# Patient Record
Sex: Female | Born: 1960
Health system: Southern US, Community
[De-identification: ages and names within clinical notes are randomized; demographics above are authoritative.]

## PROBLEM LIST (undated history)

## (undated) DIAGNOSIS — T7840XA Allergy, unspecified, initial encounter: Secondary | ICD-10-CM

## (undated) DIAGNOSIS — F32A Depression, unspecified: Secondary | ICD-10-CM

## (undated) DIAGNOSIS — M8580 Other specified disorders of bone density and structure, unspecified site: Secondary | ICD-10-CM

## (undated) DIAGNOSIS — R42 Dizziness and giddiness: Secondary | ICD-10-CM

## (undated) DIAGNOSIS — I10 Essential (primary) hypertension: Secondary | ICD-10-CM

## (undated) DIAGNOSIS — M1990 Unspecified osteoarthritis, unspecified site: Secondary | ICD-10-CM

## (undated) DIAGNOSIS — E785 Hyperlipidemia, unspecified: Secondary | ICD-10-CM

## (undated) DIAGNOSIS — F419 Anxiety disorder, unspecified: Secondary | ICD-10-CM

## (undated) DIAGNOSIS — K219 Gastro-esophageal reflux disease without esophagitis: Secondary | ICD-10-CM

## (undated) DIAGNOSIS — N979 Female infertility, unspecified: Secondary | ICD-10-CM

## (undated) DIAGNOSIS — G43909 Migraine, unspecified, not intractable, without status migrainosus: Secondary | ICD-10-CM

## (undated) HISTORY — DX: Other specified disorders of bone density and structure, unspecified site: M85.80

## (undated) HISTORY — DX: Depression, unspecified: F32.A

## (undated) HISTORY — DX: Essential (primary) hypertension: I10

## (undated) HISTORY — DX: Anxiety disorder, unspecified: F41.9

## (undated) HISTORY — DX: Unspecified osteoarthritis, unspecified site: M19.90

## (undated) HISTORY — DX: Migraine, unspecified, not intractable, without status migrainosus: G43.909

## (undated) HISTORY — PX: SHOULDER SURGERY: SHX246

## (undated) HISTORY — PX: ANKLE SURGERY: SHX546

## (undated) HISTORY — DX: Hyperlipidemia, unspecified: E78.5

## (undated) HISTORY — DX: Gastro-esophageal reflux disease without esophagitis: K21.9

## (undated) HISTORY — DX: Female infertility, unspecified: N97.9

## (undated) HISTORY — DX: Dizziness and giddiness: R42

## (undated) HISTORY — DX: Allergy, unspecified, initial encounter: T78.40XA

---

## 1998-01-30 ENCOUNTER — Inpatient Hospital Stay (HOSPITAL_COMMUNITY): Admission: AD | Admit: 1998-01-30 | Discharge: 1998-02-01 | Payer: Self-pay | Admitting: Obstetrics and Gynecology

## 1998-01-30 ENCOUNTER — Inpatient Hospital Stay (HOSPITAL_COMMUNITY): Admission: AD | Admit: 1998-01-30 | Discharge: 1998-01-30 | Payer: Self-pay | Admitting: Obstetrics and Gynecology

## 1998-03-23 ENCOUNTER — Other Ambulatory Visit: Admission: RE | Admit: 1998-03-23 | Discharge: 1998-03-23 | Payer: Self-pay | Admitting: Obstetrics and Gynecology

## 1999-04-06 ENCOUNTER — Other Ambulatory Visit: Admission: RE | Admit: 1999-04-06 | Discharge: 1999-04-06 | Payer: Self-pay | Admitting: Obstetrics and Gynecology

## 1999-10-04 ENCOUNTER — Encounter: Payer: Self-pay | Admitting: Obstetrics and Gynecology

## 1999-10-04 ENCOUNTER — Ambulatory Visit (HOSPITAL_COMMUNITY): Admission: RE | Admit: 1999-10-04 | Discharge: 1999-10-04 | Payer: Self-pay | Admitting: Obstetrics and Gynecology

## 2000-04-17 ENCOUNTER — Other Ambulatory Visit: Admission: RE | Admit: 2000-04-17 | Discharge: 2000-04-17 | Payer: Self-pay | Admitting: Obstetrics and Gynecology

## 2002-04-28 ENCOUNTER — Encounter: Payer: Self-pay | Admitting: Obstetrics and Gynecology

## 2002-04-28 ENCOUNTER — Ambulatory Visit (HOSPITAL_COMMUNITY): Admission: RE | Admit: 2002-04-28 | Discharge: 2002-04-28 | Payer: Self-pay | Admitting: Obstetrics and Gynecology

## 2002-10-02 ENCOUNTER — Other Ambulatory Visit: Admission: RE | Admit: 2002-10-02 | Discharge: 2002-10-02 | Payer: Self-pay | Admitting: Obstetrics and Gynecology

## 2003-12-16 ENCOUNTER — Other Ambulatory Visit: Admission: RE | Admit: 2003-12-16 | Discharge: 2003-12-16 | Payer: Self-pay | Admitting: Obstetrics and Gynecology

## 2004-08-23 ENCOUNTER — Ambulatory Visit (HOSPITAL_COMMUNITY): Admission: RE | Admit: 2004-08-23 | Discharge: 2004-08-23 | Payer: Self-pay | Admitting: Obstetrics and Gynecology

## 2004-10-20 ENCOUNTER — Ambulatory Visit: Payer: Self-pay | Admitting: Internal Medicine

## 2004-11-18 ENCOUNTER — Ambulatory Visit: Payer: Self-pay | Admitting: Internal Medicine

## 2004-12-15 ENCOUNTER — Ambulatory Visit: Payer: Self-pay | Admitting: Internal Medicine

## 2004-12-20 ENCOUNTER — Other Ambulatory Visit: Admission: RE | Admit: 2004-12-20 | Discharge: 2004-12-20 | Payer: Self-pay | Admitting: Obstetrics and Gynecology

## 2004-12-23 ENCOUNTER — Encounter: Admission: RE | Admit: 2004-12-23 | Discharge: 2004-12-23 | Payer: Self-pay | Admitting: Obstetrics and Gynecology

## 2005-05-24 ENCOUNTER — Emergency Department (HOSPITAL_COMMUNITY): Admission: EM | Admit: 2005-05-24 | Discharge: 2005-05-24 | Payer: Self-pay | Admitting: Family Medicine

## 2005-11-09 ENCOUNTER — Ambulatory Visit: Payer: Self-pay | Admitting: Internal Medicine

## 2006-03-10 ENCOUNTER — Emergency Department (HOSPITAL_COMMUNITY): Admission: EM | Admit: 2006-03-10 | Discharge: 2006-03-10 | Payer: Self-pay | Admitting: Family Medicine

## 2006-04-19 ENCOUNTER — Other Ambulatory Visit: Admission: RE | Admit: 2006-04-19 | Discharge: 2006-04-19 | Payer: Self-pay | Admitting: Obstetrics and Gynecology

## 2007-03-06 ENCOUNTER — Ambulatory Visit: Payer: Self-pay | Admitting: Internal Medicine

## 2008-03-27 ENCOUNTER — Encounter: Admission: RE | Admit: 2008-03-27 | Discharge: 2008-03-27 | Payer: Self-pay | Admitting: Obstetrics and Gynecology

## 2008-09-02 ENCOUNTER — Ambulatory Visit: Payer: Self-pay | Admitting: Occupational Medicine

## 2008-09-02 LAB — CONVERTED CEMR LAB
Bilirubin Urine: NEGATIVE
Glucose, Urine, Semiquant: NEGATIVE
Specific Gravity, Urine: 1.01
pH: 6.5

## 2008-12-11 LAB — HM PAP SMEAR

## 2008-12-11 LAB — HM COLONOSCOPY

## 2009-01-19 ENCOUNTER — Ambulatory Visit: Payer: Self-pay | Admitting: Nurse Practitioner

## 2009-04-07 ENCOUNTER — Ambulatory Visit: Payer: Self-pay | Admitting: Family Medicine

## 2009-04-07 DIAGNOSIS — R519 Headache, unspecified: Secondary | ICD-10-CM | POA: Insufficient documentation

## 2009-04-07 DIAGNOSIS — R51 Headache: Secondary | ICD-10-CM

## 2009-04-07 LAB — CONVERTED CEMR LAB
Glucose, Urine, Semiquant: 100
Ketones, urine, test strip: NEGATIVE
Nitrite: POSITIVE
Specific Gravity, Urine: 1.02
Urobilinogen, UA: 1
pH: 5.5

## 2009-05-26 ENCOUNTER — Ambulatory Visit: Payer: Self-pay | Admitting: Nurse Practitioner

## 2010-01-04 ENCOUNTER — Ambulatory Visit: Payer: Self-pay | Admitting: Family Medicine

## 2010-01-04 ENCOUNTER — Telehealth: Payer: Self-pay | Admitting: Family Medicine

## 2010-01-04 DIAGNOSIS — J029 Acute pharyngitis, unspecified: Secondary | ICD-10-CM | POA: Insufficient documentation

## 2010-01-05 ENCOUNTER — Encounter: Payer: Self-pay | Admitting: Family Medicine

## 2010-03-15 ENCOUNTER — Ambulatory Visit: Payer: Self-pay | Admitting: Nurse Practitioner

## 2010-03-17 ENCOUNTER — Ambulatory Visit: Payer: Self-pay | Admitting: Obstetrics and Gynecology

## 2010-03-17 ENCOUNTER — Other Ambulatory Visit: Admission: RE | Admit: 2010-03-17 | Discharge: 2010-03-17 | Payer: Self-pay | Admitting: Obstetrics and Gynecology

## 2010-03-24 ENCOUNTER — Ambulatory Visit: Payer: Self-pay | Admitting: Obstetrics and Gynecology

## 2010-03-29 ENCOUNTER — Encounter: Admission: RE | Admit: 2010-03-29 | Discharge: 2010-03-29 | Payer: Self-pay | Admitting: Obstetrics and Gynecology

## 2010-09-13 ENCOUNTER — Ambulatory Visit: Payer: Self-pay | Admitting: Nurse Practitioner

## 2010-10-31 ENCOUNTER — Encounter (INDEPENDENT_AMBULATORY_CARE_PROVIDER_SITE_OTHER): Payer: Self-pay | Admitting: *Deleted

## 2010-11-01 ENCOUNTER — Ambulatory Visit: Payer: Self-pay | Admitting: Gastroenterology

## 2010-11-10 ENCOUNTER — Ambulatory Visit: Payer: Self-pay | Admitting: Gastroenterology

## 2010-11-10 LAB — HM COLONOSCOPY

## 2011-01-12 NOTE — Assessment & Plan Note (Signed)
Summary: LEFT TONSIL & EARACHE PAIN/KH   Vital Signs:  Patient Profile:   50 Years Old Female CC:      left tonsil and left earache x 4 days Height:     66 inches Weight:      165 pounds O2 Sat:      100 % O2 treatment:    Room Air Temp:     97.0 degrees F oral Pulse rate:   55 / minute Pulse rhythm:   regular Resp:     12 per minute BP sitting:   117 / 79  (right arm) Cuff size:   regular  Vitals Entered By: Emilio Math (January 04, 2010 9:03 AM)                  Current Allergies (reviewed today): No known allergies History of Present Illness Chief Complaint: left tonsil and left earache x 4 days History of Present Illness: Subjective:  Patient complains of 4 day history of pain and swelling in left tonsil, with left neck tenderness and ? left earache.  Has chronic nasal drainage but no exacerabation recently.  No cough.  No fevers, chills, and sweats but has been taking Ibuprofen which decreases the discomfort.  Has felt fatigued.  She has had several similar episodes in the past, some resolving spontaneously and some requiring an antibiotic.  Current Meds TOPAMAX 100 MG TABS (TOPIRAMATE) 1 hs ZOLOFT 50 MG TABS (SERTRALINE HCL) 1 qd REGLAN 10 MG TABS (METOCLOPRAMIDE HCL) 1 prn METOPROLOL TARTRATE 25 MG TABS (METOPROLOL TARTRATE) 1 hs CALCIUM 500/D 500-200 MG-UNIT TABS (CALCIUM CARBONATE-VITAMIN D) 1 qd ADULT ASPIRIN EC LOW STRENGTH 81 MG TBEC (ASPIRIN) 1 hs MULTIVITAMINS  TABS (MULTIPLE VITAMIN) 1 hs CEPHALEXIN 500 MG CAPS (CEPHALEXIN) 1 by mouth qday after relationship IBUPROFEN 200 MG TABS (IBUPROFEN) 800 mgs q 8 hrs ZYRTEC ALLERGY 10 MG TABS (CETIRIZINE HCL) 1 qd AMOXICILLIN 875 MG TABS (AMOXICILLIN) One by mouth two times a day  REVIEW OF SYSTEMS Constitutional Symptoms      Denies fever, chills, night sweats, weight loss, weight gain, and fatigue.  Eyes       Denies change in vision, eye pain, eye discharge, glasses, contact lenses, and eye  surgery. Ear/Nose/Throat/Mouth       Complains of ear pain.      Denies hearing loss/aids, change in hearing, ear discharge, dizziness, frequent runny nose, frequent nose bleeds, sinus problems, sore throat, hoarseness, and tooth pain or bleeding.      Comments: Left tonsil Respiratory       Denies dry cough, productive cough, wheezing, shortness of breath, asthma, bronchitis, and emphysema/COPD.  Cardiovascular       Denies murmurs, chest pain, and tires easily with exhertion.    Gastrointestinal       Denies stomach pain, nausea/vomiting, diarrhea, constipation, blood in bowel movements, and indigestion. Genitourniary       Denies painful urination, kidney stones, and loss of urinary control. Neurological       Denies paralysis, seizures, and fainting/blackouts. Musculoskeletal       Denies muscle pain, joint pain, joint stiffness, decreased range of motion, redness, swelling, muscle weakness, and gout.  Skin       Denies bruising, unusual mles/lumps or sores, and hair/skin or nail changes.  Psych       Denies mood changes, temper/anger issues, anxiety/stress, speech problems, depression, and sleep problems.  Past History:  Past Medical History: Reviewed history from 04/07/2009 and no changes required. Headache-Migraine  Past Surgical History: Reviewed history from 04/07/2009 and no changes required. left ankle left shoulder GYN surgery  Family History: Reviewed history from 04/07/2009 and no changes required. Family History of Alcoholism/Addiction Family History High cholesterol Family History Hypertension Family History of Stroke F 1st degree relative <60  Social History: Reviewed history from 04/07/2009 and no changes required. Occupation:Healthworker Married Former Smoker-quit 101yrs ago Alcohol use-yes, occasional Drug use-no   Objective:  No acute distress  Ears:  Canals normal.  Tympanic membranes normal.   Pharynx:  Left tonsil slightly enlarged but  minimal erythema.   No exudate. Neck:  Supple.  Tender shotty left anterior node. Assessment New Problems: ACUTE PHARYNGITIS (ICD-462)   Plan New Medications/Changes: AMOXICILLIN 875 MG TABS (AMOXICILLIN) One by mouth two times a day  #20 x 0, 01/04/2010, Donna Christen MD  New Orders: T-Culture, Throat [[id number]] Est. Patient Level III [14782] Planning Comments:   Throat culture pending.  Treat symptomatically for now with Ibuprofen.  If culture positive, begin amoxicillin.  If culture negative, but no improvement after several more days, begin amoxicillin. Continue Ibuprofen.  Warm saline gargles.   The patient and/or caregiver has been counseled thoroughly with regard to medications prescribed including dosage, schedule, interactions, rationale for use, and possible side effects and they verbalize understanding.  Diagnoses and expected course of recovery discussed and will return if not improved as expected or if the condition worsens. Patient and/or caregiver verbalized understanding.  Prescriptions: AMOXICILLIN 875 MG TABS (AMOXICILLIN) One by mouth two times a day  #20 x 0   Entered and Authorized by:   Donna Christen MD   Signed by:   Donna Christen MD on 01/04/2010   Method used:   Print then Give to Patient   RxID:   [id number]

## 2011-01-12 NOTE — Miscellaneous (Signed)
Summary: [facility]  Clinical Lists Changes  Medications: Added new medication of MOVIPREP 100 GM  SOLR (PEG-KCL-NACL-NASULF-NA ASC-C) As per prep instructions. - Signed Rx of MOVIPREP 100 GM  SOLR (PEG-KCL-NACL-NASULF-NA ASC-C) As per prep instructions.;  #1 x 0;  Signed;  Entered by: Ezra Sites RN;  Authorized by: Meryl Dare MD [name];  Method used: Electronically to [facility]*, [address], [address] Shipping/mailing, [place], [place]  [address], Ph: [phone], Fax: [phone] Observations: Added new observation of NKA: T (11/01/2010 10:57)    Prescriptions: MOVIPREP 100 GM  SOLR (PEG-KCL-NACL-NASULF-NA ASC-C) As per prep instructions.  #1 x 0   Entered by:   Ezra Sites RN   Authorized by:   Meryl Dare MD [facility]   Signed by:   Ezra Sites RN on 11/01/2010   Method used:   Electronically to        [facility]* (retail)       [address]       [address] [place] [address]       [place], [place]  [address]       Ph: [phone]       Fax: [phone]   RxID:   [id number]

## 2011-01-12 NOTE — Progress Notes (Signed)
  Phone Note From Pharmacy   Summary of Call: [place]/Pharmacy [facility] @ [phone] cllng in requesting change in antibiotic  Amoxicillin 875mg  to Amoxicillin 500mg  t.i.d. due Amx 875mg  being on back order. Also, pharmacist stated that pt is requesting Diflucan as well.  Initial call taken by: Areta Haber CMA,  January 04, 2010 6:05 PM    New/Updated Medications: AMOXICILLIN 500 MG CAPS (AMOXICILLIN) One capsule by mouth three times a day DIFLUCAN 150 MG TABS (FLUCONAZOLE) One by mouth as a single dose Prescriptions: DIFLUCAN 150 MG TABS (FLUCONAZOLE) One by mouth as a single dose  #1 x 0   Entered and Authorized by:   Donna Christen MD   Signed by:   Donna Christen MD on 01/04/2010   Method used:   Electronically to        [facility] [address] [id number]* (retail)       [address]       [place], [place]  [address]       Ph: [phone]       Fax: [phone]   RxID:   [id number] AMOXICILLIN 500 MG CAPS (AMOXICILLIN) One capsule by mouth three times a day  #30 x 0   Entered and Authorized by:   Donna Christen MD   Signed by:   Donna Christen MD on 01/04/2010   Method used:   Electronically to        [facility] [address] [id number]* (retail)       [address]       [place], [place]  [address]       Ph: [phone]       Fax: [phone]   RxID:   [id number]  Rx sent Donna Christen MD  January 04, 2010 6:55 PM

## 2011-01-12 NOTE — Procedures (Signed)
Summary: Colonoscopy  Patient: [name] Note: All result statuses are Final unless otherwise noted.  Tests: (1) Colonoscopy (COL)   COL Colonoscopy           DONE     [facility]     [address]     [place], [place]  [address]           COLONOSCOPY PROCEDURE REPORT     PATIENT:  [name], [name]  MR#:  [id number]     BIRTHDATE:  11/05/1961, 49 yrs. old  GENDER:  female     ENDOSCOPIST:  Judie Petit T. Russella Dar, MD, [facility]     Referred by:  Tracey Harries, M.D.     PROCEDURE DATE:  11/10/2010     PROCEDURE:  Colonoscopy 19[address]     ASA CLASS:  Class II     INDICATIONS:  1) Routine Risk Screening     MEDICATIONS:   Fentanyl 75 mcg IV, Versed 7 mg IV     DESCRIPTION OF PROCEDURE:   After the risks benefits and     alternatives of the procedure were thoroughly explained, informed     consent was obtained.  Digital rectal exam was performed and     revealed no abnormalities.   The LB [id number] endoscope     was introduced through the anus and advanced to the cecum, which     was identified by both the appendix and ileocecal valve, without     limitations.  The quality of the prep was excellent, using     MoviPrep.  The instrument was then slowly withdrawn as the colon     was fully examined.     <<PROCEDUREIMAGES>>     FINDINGS:  A normal appearing cecum, ileocecal valve, and     appendiceal orifice were identified. The ascending, hepatic     flexure, transverse, splenic flexure, descending, sigmoid colon,     and rectum appeared unremarkable.  Retroflexed views in the rectum     revealed no abnormalities.  The time to cecum =  4.25  minutes.     The scope was then withdrawn (time =  10.33  min) from the patient     and the procedure completed.           COMPLICATIONS:  None           ENDOSCOPIC IMPRESSION:     1) Normal colon           RECOMMENDATIONS:     1) Continue current colorectal screening for "routine risk"     patients with a repeat colonoscopy in 10  years.           Venita Lick. Russella Dar, MD, [name]           n.     eSIGNED:   Venita Lick. Stark at 11/10/2010 11:47 AM           Emilio Math, [phone]  Note: An exclamation mark (!) indicates a result that was not dispersed into the flowsheet. Document Creation Date: 11/10/2010 11:48 AM _______________________________________________________________________  (1) Order result status: Final Collection or observation date-time: 11/10/2010 11:45 Requested date-time:  Receipt date-time:  Reported date-time:  Referring Physician:   Ordering Physician: Claudette Head [phone]) Specimen Source:  Source: Launa Grill Order Number: [id number] Lab site:   Appended Document: Colonoscopy    Clinical Lists Changes  Observations: Added new observation of COLONNXTDUE: 11/2020 (11/10/2010 12:34)

## 2011-01-12 NOTE — Letter (Signed)
Summary: [name]  [facility]  [address] [place], [place] [address]   Phone: [phone]  Fax: [phone]       [name]    1961/05/25    MRN: [id number]        Procedure Day /Date:  Thursday 11/10/2010     Arrival Time: 9:00 am     Procedure Time:  10:00 am     Location of Procedure:                    _ x_  [facility] (4th Floor)                        PREPARATION FOR COLONOSCOPY WITH MOVIPREP   Starting 5 days prior to your procedure Saturday 11/26 do not eat nuts, seeds, popcorn, corn, beans, peas,  salads, or any raw vegetables.  Do not take any fiber supplements (e.g. Metamucil, Citrucel, and Benefiber).  THE DAY BEFORE YOUR PROCEDURE         DATE: Wednesday 11/30  1.  Drink clear liquids the entire day-NO SOLID FOOD  2.  Do not drink anything colored red or purple.  Avoid juices with pulp.  No orange juice.  3.  Drink at least 64 oz. (8 glasses) of fluid/clear liquids during the day to prevent dehydration and help the prep work efficiently.  CLEAR LIQUIDS INCLUDE: Water Jello Ice Popsicles Tea (sugar ok, no milk/cream) Powdered fruit flavored drinks Coffee (sugar ok, no milk/cream) Gatorade Juice: apple, white grape, white cranberry  Lemonade Clear bullion, consomm, broth Carbonated beverages (any kind) Strained chicken noodle soup Hard Candy                             4.  In the morning, mix first dose of MoviPrep solution:    Empty 1 Pouch A and 1 Pouch B into the disposable container    Add lukewarm drinking water to the top line of the container. Mix to dissolve    Refrigerate (mixed solution should be used within 24 hrs)  5.  Begin drinking the prep at 5:00 p.m. The MoviPrep container is divided by 4 marks.   Every 15 minutes drink the solution down to the next mark (approximately 8 oz) until the full liter is complete.   6.  Follow completed prep with 16 oz of clear liquid of your choice  (Nothing red or purple).  Continue to drink clear liquids until bedtime.  7.  Before going to bed, mix second dose of MoviPrep solution:    Empty 1 Pouch A and 1 Pouch B into the disposable container    Add lukewarm drinking water to the top line of the container. Mix to dissolve    Refrigerate  THE DAY OF YOUR PROCEDURE      DATE: Thursday 12/1  Beginning at 5:00 a.m. (5 hours before procedure):         1. Every 15 minutes, drink the solution down to the next mark (approx 8 oz) until the full liter is complete.  2. Follow completed prep with 16 oz. of clear liquid of your choice.    3. You may drink clear liquids until 8:00 am (2 HOURS BEFORE PROCEDURE).   MEDICATION INSTRUCTIONS  Unless otherwise instructed, you should take regular prescription medications with a small sip of water   as early as possible the morning of  your procedure.          OTHER INSTRUCTIONS  You will need a responsible adult at least 52 years of age to accompany you and drive you home.   This person must remain in the waiting room during your procedure.  Wear loose fitting clothing that is easily removed.  Leave jewelry and other valuables at home.  However, you may wish to bring a book to read or  an iPod/MP3 player to listen to music as you wait for your procedure to start.  Remove all body piercing jewelry and leave at home.  Total time from sign-in until discharge is approximately 2-3 hours.  You should go home directly after your procedure and rest.  You can resume normal activities the  day after your procedure.  The day of your procedure you should not:   Drive   Make legal decisions   Operate machinery   Drink alcohol   Return to work  You will receive specific instructions about eating, activities and medications before you leave.    The above instructions have been reviewed and explained to me by   Ezra Sites RN  November 01, 2010 11:27 AM    I fully understand and  can verbalize these instructions _____________________________ Date _________

## 2011-04-25 NOTE — Assessment & Plan Note (Signed)
[name], [name]               ACCOUNT NO.:  [id number]   MEDICAL RECORD NO.:  [id number]          PATIENT TYPE:  POB   LOCATION:  [facility] [place]         FACILITY:  [facility]   PHYSICIAN:  Allie Bossier, MD        DATE OF BIRTH:  1961-10-06   DATE OF SERVICE:  05/26/2009                                  CLINIC NOTE   The patient comes to the office today for headache revisit.  She has  been doing very well with her headaches until one month ago when her  mother-in-law died.  They had spent many nights up with her at night  sitting with her.  She was missing sleep and upset and anxious.  Following that she has had a 2-3 week cycle of headaches.  She has used  most of her medication and cannot seem to break her headache cycle.  She  has taken a prednisone dose taper in the past and that has worked well  for her and she would like to have that again today.  She would also  like to have her medications refilled.  Otherwise she is doing very well  with her job at [employer] and her family is doing well.   PHYSICAL EXAMINATION:  Well-developed, well-nourished 50 year old  Caucasian female in no acute distress.  VITAL SIGNS:  Blood pressure 118/73, pulse 45, weight 156.   ASSESSMENT:  Migraine headaches.   PLAN:  1. We will give her a prednisone taper, prednisone 20 mg 4 tablets x 1      day, 3 tablets x 1 day, 2 tablets x 1 day and 1 tablet x 1day #10      with one refill.  2. She is also requesting refills on her Imitrex injection, 6 mg one      injectable p.r.n. migraine #2 with p.r.n. refills.  3. Amerge 2.5 mg 1 p.o. p.r.n. migraine #9 with p.r.n. refills.  4. Xanax 0.25 mg 1 p.o. every 6 hours p.r.n. #30 with 2 refills.  5. Vicodin ES 1 p.o. every 4 hours p.r.n. #30 with one refill.  6. The patient will continue to do a good job taking care of her      headaches and we will see her back in 6 months.      Remonia Richter, NP    ______________________________  Allie Bossier,  MD    LR/MEDQ  D:  05/26/2009  T:  05/26/2009  Job:  [id number]

## 2011-04-25 NOTE — Assessment & Plan Note (Signed)
NAME:  [name], [name]               ACCOUNT NO.:  [id number]   MEDICAL RECORD NO.:  [id number]          PATIENT TYPE:  POB   LOCATION:  [facility]         FACILITY:  [facility]   PHYSICIAN:  Argentina Donovan, MD        DATE OF BIRTH:  01-22-1961   DATE OF SERVICE:  01/19/2009                                  CLINIC NOTE   The patient comes to office today for consultation for migraine  headaches.  The patient does have very longstanding history  approximately 30 years of having migraine with aura.  The aura is visual  in nature.  Her headaches are located right or left temple.  Her  headaches are significantly improved.  She has approximately 1 severe, 1  moderate, 1 mild per month.  The patient is in on prevention in the form  of Zoloft, metoprolol, and Topamax.  She does have her p.r.n.  medications, which she takes on an as-needed basis.  This patient admits  that her triggers are stress.  Her headaches were significantly worse  when she had her periods.  She has not had a menstrual cycle in the last  3 years.  She has declined hormone replacement therapy and is currently  in menopause.  The patient [facility] well [facility]  She is very happy with her care plan from there and  would basically like to have her medications refilled.   ALLERGIES:  No known drug allergies.   MEDICATIONS:  Topamax, metoprolol, Zoloft, Reglan, baclofen, Xanax,  Amerge, Vicodin, Imitrex injection.   MENSTRUAL HISTORY:  As previously discussed.   OBSTETRICAL HISTORY:  G1, P1.  Last Pap smear was April 2009.  She has  never had an abnormal Pap smear.   FAMILY HISTORY:  Mother with high blood pressure, grandmother with  cancer.   PERSONAL HISTORY:  Migraine headaches.   SOCIAL HISTORY:  The patient works at [employer].  She is  a Scientist, clinical (histocompatibility and immunogenetics).  She does not smoke.  She drinks 2 alcoholic beverages per  day.  She drinks 3 caffeinated beverages per day.  She has  not been  abused.   REVIEW OF SYSTEMS:  Negative for bruising, numbness, swelling, muscle  fevers, chest pain, hot flashes, positive for weight gain.   PHYSICAL EXAMINATION:  GENERAL:  A well-developed, well-nourished 50-  year-old Caucasian female in no acute distress.  VITAL SIGNS:  Blood pressure is 106/72, pulse is 55, weight is 167,  height is 5 feet 6.  HEENT:  Head is atraumatic and with even hair distribution.  Pupils  equal and react.  CARDIAC:  Regular rate and rhythm.  LUNGS:  Clear bilaterally.  NEUROLOGIC:  The patient is alert and oriented.  She is neurologically  intact.  She has good muscle sensation.  Good coordination.  EXTREMITIES:  Without swelling.   ASSESSMENT:  1. Migraine with aura.  2. Anxiety.   PLAN:  Again, this patient is well known to me from [facility] for many years constructing a medication plan that works  well for her.  She is doing very well.  She would  like to remain on her  medication regime.  Today, we will refill her Zoloft, Topamax, Amerge,  and Xanax that she takes on an as-needed basis.  She will return in 6  months or sooner as needed.      Remonia Richter, NP    ______________________________  Argentina Donovan, MD    LR/MEDQ  D:  01/19/2009  T:  01/20/2009  Job:  [id number]

## 2011-04-25 NOTE — Assessment & Plan Note (Signed)
[name], [name]               ACCOUNT NO.:  [id number]   MEDICAL RECORD NO.:  [id number]          PATIENT TYPE:  POB   LOCATION:  [facility] [place]         FACILITY:  [facility]   PHYSICIAN:  Remonia Richter, NP   DATE OF BIRTH:  1961/03/11   DATE OF SERVICE:  09/13/2010                                  CLINIC NOTE   The patient comes to office today for followup on her migraine  headaches.  From a headache perspective, she has been doing better.  She  feels that she is having more mild and moderate and few are severe.  She  did have 2 severe since our last office visit.  She took her Imitrex  injection and those resolved very well.  She is currently using 0.25 mg  of Xanax nightly for what she calls a racing thought.  This helps her to  relax a bit and go to sleep.  She felt that the Ambien was too strong  for her and prefers to stay on the Xanax.  She is also having some  difficulty with her Toprol breaking a 50-mg tablet in half and would  like to have that switched over to a 25-mg tablet.  Her daughter is  currently studying and planning for her med school, which will be in  February.  This is causing some anxiety and stress, though it is mostly  fun for her.   PHYSICAL EXAMINATION:  GENERAL:  Well-developed, well-nourished 50-year-  old Caucasian female in no acute distress.  HEENT:  Head is normocephalic and atraumatic.  Pupils equal and react.  CARDIAC:  Regular rate and rhythm.  LUNGS:  Clear bilaterally.   ASSESSMENT:  1. Migraine.  2. Anxiety.   PLAN:  We will give her a new prescription for Toprol 25 mg one p.o.  daily, #90 with 4 refills.  She also needs her Zofran 8 mg one p.o. q.6  h. p.r.n. nausea, #30 with 3 refills.  She is also out of her Vicodin DS  one p.o. q.6 h. p.r.n., #30 with 2 refills and her Xanax 0.25 mg one  p.o. at bedtime, #30 with 3 refills.  The patient is asked to return in  1 year or as needed.      Remonia Richter, NP     LR/MEDQ   D:  09/13/2010  T:  09/14/2010  Job:  [id number]

## 2011-04-25 NOTE — Assessment & Plan Note (Signed)
NAME:  [name], [name]               ACCOUNT NO.:  [id number]   MEDICAL RECORD NO.:  [id number]          PATIENT TYPE:  POB   LOCATION:  [facility] [place]         FACILITY:  [facility]   PHYSICIAN:  Elsie Lincoln, MD      DATE OF BIRTH:  [birth year]   DATE OF SERVICE:  03/15/2010                                  CLINIC NOTE   The patient comes to office today for followup on her headaches.  The  patient was last seen in June 2010.  Since then, she had been doing very  well up until several weeks ago when she had decided to wean herself off  her Topamax and her Toprol.  She had a severe migraine that was a 10/10  that lasted for approximately 5 days.  After that, she restarted her  meds.  She is currently on Topamax 50 mg, Toprol 50 mg, Zoloft 50 mg.  She feels that she would like to try to stay at this lower dose at this  point if possible.  She is currently going through her through her  menopause for the last 3 years and does feel more stable from only.  She  is not on hormone replacement.  She and her husband have moved and she  is happy in her new house.  Things are going well with her job.   PHYSICAL EXAMINATION:  VITAL SIGNS:  Blood pressure is 123/78, pulse is  50, weight is 163.  GENERAL:  Well-developed, well-nourished 50 year old Caucasian female,  in no acute distress.  HEENT:  Head is normocephalic and atraumatic.  Pupils equal and react.  NEUROLOGICAL:  The patient is alert, oriented, and well coordinated.   ASSESSMENT:  Migraine.   PLAN:  We will refill her meds today.   1. Topamax 50 mg 1 p.o. daily #90 with 4 refills.  2. Toprol 100 mg 1/2 tablet 1 p.o. daily #45 with 4 refills.  3. Zofran 8 mg 1 p.o. q.8 hours p.r.n. #20 with 3 refills.  4. Zoloft 100 mg 1/2 tablet daily #45 with 11 refills.  5. Imitrex injection 4 mg p.r.n. #2 with 11 refills.  6. Amerge 2.5 mg 1 p.o. p.r.n. #9 with 11 refills.  7. Xanax 0.25 mg 1 p.o. q.6 hours p.r.n. #30 with 2 refills.  8.  Vicodin ES 1 p.o. q.4 hours p.r.n. #30 with 2 refills.  9. Baclofen 10 mg 1 p.o. q.8 hours p.r.n. #60 with 3 refills.   She will manipulate her medications.  If she sees that the dosing is not  satisfactory, she can call the office, otherwise she will return to the  clinic in 1 year.      Remonia Richter, NP    ______________________________  Elsie Lincoln, MD    LR/MEDQ  D:  03/15/2010  T:  03/16/2010  Job:  [id number]

## 2011-08-07 ENCOUNTER — Other Ambulatory Visit: Payer: Self-pay | Admitting: Family Medicine

## 2011-08-07 ENCOUNTER — Inpatient Hospital Stay (INDEPENDENT_AMBULATORY_CARE_PROVIDER_SITE_OTHER)
Admission: RE | Admit: 2011-08-07 | Discharge: 2011-08-07 | Disposition: A | Discharge status: Home / Self Care | Payer: 59 | Source: Ambulatory Visit | Attending: Family Medicine | Admitting: Family Medicine

## 2011-08-07 ENCOUNTER — Ambulatory Visit
Admission: RE | Admit: 2011-08-07 | Discharge: 2011-08-07 | Disposition: A | Discharge status: Home / Self Care | Payer: 59 | Source: Ambulatory Visit | Attending: Family Medicine | Admitting: Family Medicine

## 2011-08-07 ENCOUNTER — Encounter: Payer: Self-pay | Admitting: Family Medicine

## 2011-08-07 DIAGNOSIS — T07XXXA Unspecified multiple injuries, initial encounter: Secondary | ICD-10-CM

## 2011-08-07 DIAGNOSIS — T148XXA Other injury of unspecified body region, initial encounter: Secondary | ICD-10-CM

## 2011-08-22 ENCOUNTER — Ambulatory Visit: Payer: 59 | Admitting: Family Medicine

## 2011-10-03 ENCOUNTER — Other Ambulatory Visit: Payer: Self-pay | Admitting: *Deleted

## 2011-10-03 ENCOUNTER — Encounter: Payer: Self-pay | Admitting: *Deleted

## 2011-10-03 NOTE — Telephone Encounter (Signed)
Pt called requesting RF on Vicodin ES.  Per Remonia Richter NP may call in RF to [facility] with 2 RF.

## 2011-10-24 ENCOUNTER — Other Ambulatory Visit: Payer: Self-pay | Admitting: *Deleted

## 2011-10-24 DIAGNOSIS — G43909 Migraine, unspecified, not intractable, without status migrainosus: Secondary | ICD-10-CM

## 2011-10-24 MED ORDER — ALPRAZOLAM 0.25 MG PO TABS: 0.2500 mg | ORAL_TABLET | Freq: Every evening | ORAL | Status: DC | PRN

## 2011-10-31 ENCOUNTER — Other Ambulatory Visit: Payer: Self-pay | Admitting: *Deleted

## 2011-10-31 DIAGNOSIS — G43909 Migraine, unspecified, not intractable, without status migrainosus: Secondary | ICD-10-CM

## 2011-10-31 MED ORDER — ALPRAZOLAM 0.25 MG PO TABS: 0.2500 mg | ORAL_TABLET | Freq: Every evening | ORAL | Status: DC | PRN
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
## 2011-11-13 NOTE — Letter (Signed)
Summary: Out of Work  [facility]  [address]   [place], [place] [address]   Phone: [phone]  Fax: [phone]    August 07, 2011   Employee:  [name]    To Whom It May Concern:   For Medical reasons, please excuse the above named employee from work for the following dates:  Start:   08/07/2011  End:   08/292012  If you need additional information, please feel free to contact our office.         Sincerely,    Hassan Rowan MD

## 2011-12-18 ENCOUNTER — Encounter: Payer: Self-pay | Admitting: Family Medicine

## 2011-12-18 ENCOUNTER — Ambulatory Visit (INDEPENDENT_AMBULATORY_CARE_PROVIDER_SITE_OTHER): Payer: 59 | Admitting: Family Medicine

## 2011-12-18 VITALS — BP 120/76 | HR 70 | Temp 97.8°F | Ht 64.5 in | Wt 167.4 lb

## 2011-12-18 DIAGNOSIS — R5381 Other malaise: Secondary | ICD-10-CM

## 2011-12-18 DIAGNOSIS — R5383 Other fatigue: Secondary | ICD-10-CM

## 2011-12-18 DIAGNOSIS — G4700 Insomnia, unspecified: Secondary | ICD-10-CM

## 2011-12-18 MED ORDER — TRAZODONE HCL 50 MG PO TABS: 25.0000 mg | ORAL_TABLET | Freq: Every evening | ORAL | Status: DC | PRN

## 2011-12-18 NOTE — Patient Instructions (Signed)

## 2011-12-18 NOTE — Progress Notes (Signed)
  Subjective:    Patient ID: [name], female    DOB: 22-Aug-1961, 51 y.o.   MRN: [id number]  HPI Pt here to establish and c/o insomnia.  She falls asleep 6/7 nights with a xanax but then wakes up about 1-2 hours later.         Review of Systems    as above Objective:   Physical Exam  Constitutional: She is oriented to person, place, and time. She appears well-developed and well-nourished.  Cardiovascular: Normal rate, regular rhythm and normal heart sounds.  Exam reveals no gallop and no friction rub.   No murmur heard. Pulmonary/Chest: Effort normal and breath sounds normal. No respiratory distress. She has no wheezes. She has no rales. She exhibits no tenderness.  Musculoskeletal: She exhibits no edema.  Neurological: She is alert and oriented to person, place, and time.  Skin: Skin is warm and dry.  Psychiatric: She has a normal mood and affect. Her behavior is normal.          Assessment & Plan:  Insomnia--- trazadone only 3 days a week,   Insomnia HO given to pt

## 2012-01-25 ENCOUNTER — Other Ambulatory Visit: Payer: Self-pay | Admitting: *Deleted

## 2012-01-25 DIAGNOSIS — G43909 Migraine, unspecified, not intractable, without status migrainosus: Secondary | ICD-10-CM

## 2012-01-25 MED ORDER — BACLOFEN 10 MG PO TABS: 10.0000 mg | ORAL_TABLET | Freq: Three times a day (TID) | ORAL | Status: DC | PRN

## 2012-01-25 MED ORDER — ONDANSETRON HCL 8 MG PO TABS: 8.0000 mg | ORAL_TABLET | Freq: Three times a day (TID) | ORAL | Status: DC | PRN

## 2012-01-25 NOTE — Telephone Encounter (Signed)
Pt called requesting RF on Zofran and Baclofen.  RF was authorized per Feliberto Gottron and sent to [facility].

## 2012-02-20 ENCOUNTER — Other Ambulatory Visit: Payer: Self-pay | Admitting: Family Medicine

## 2012-02-20 NOTE — Telephone Encounter (Signed)
Last seen and filled 12/18/11 # 30. Please advise    KP

## 2012-03-12 ENCOUNTER — Other Ambulatory Visit: Payer: Self-pay | Admitting: *Deleted

## 2012-03-12 DIAGNOSIS — G43909 Migraine, unspecified, not intractable, without status migrainosus: Secondary | ICD-10-CM

## 2012-03-12 MED ORDER — HYDROCODONE-ACETAMINOPHEN 7.5-750 MG PO TABS: 1.0000 | ORAL_TABLET | ORAL | Status: DC | PRN

## 2012-03-12 NOTE — Telephone Encounter (Signed)
Pt requesting RF on Vicoden ES.  Per VO of Standley Dakins may call in to [facility].

## 2012-05-09 ENCOUNTER — Other Ambulatory Visit: Payer: Self-pay | Admitting: Obstetrics and Gynecology

## 2012-05-09 DIAGNOSIS — Z1231 Encounter for screening mammogram for malignant neoplasm of breast: Secondary | ICD-10-CM

## 2012-05-16 ENCOUNTER — Other Ambulatory Visit: Payer: Self-pay | Admitting: *Deleted

## 2012-05-16 DIAGNOSIS — G43909 Migraine, unspecified, not intractable, without status migrainosus: Secondary | ICD-10-CM

## 2012-05-16 MED ORDER — SUMATRIPTAN SUCCINATE 6 MG/0.5ML ~~LOC~~ SOLN: 4.0000 mg | SUBCUTANEOUS | Status: DC | PRN

## 2012-05-16 MED ORDER — HYDROCODONE-ACETAMINOPHEN 7.5-750 MG PO TABS: 1.0000 | ORAL_TABLET | ORAL | Status: DC | PRN

## 2012-05-16 MED ORDER — METOPROLOL SUCCINATE ER 50 MG PO TB24: 50.0000 mg | ORAL_TABLET | Freq: Every day | ORAL | Status: DC

## 2012-05-16 NOTE — Telephone Encounter (Signed)
Pt called requesting RF's on meds for migraines.  She has made an appt with Standley Dakins to f/u on migraines.  RX RF's sent to [facility] per Midge Aver, NP

## 2012-05-17 ENCOUNTER — Ambulatory Visit: Payer: 59

## 2012-05-28 ENCOUNTER — Encounter: Payer: Self-pay | Admitting: Nurse Practitioner

## 2012-05-28 ENCOUNTER — Ambulatory Visit (INDEPENDENT_AMBULATORY_CARE_PROVIDER_SITE_OTHER): Payer: 59 | Admitting: Nurse Practitioner

## 2012-05-28 VITALS — BP 106/67 | HR 66 | Temp 98.0°F | Resp 16 | Ht 65.0 in | Wt 167.0 lb

## 2012-05-28 DIAGNOSIS — M62838 Other muscle spasm: Secondary | ICD-10-CM

## 2012-05-28 DIAGNOSIS — G4700 Insomnia, unspecified: Secondary | ICD-10-CM

## 2012-05-28 DIAGNOSIS — G43909 Migraine, unspecified, not intractable, without status migrainosus: Secondary | ICD-10-CM

## 2012-05-28 MED ORDER — NARATRIPTAN HCL 2.5 MG PO TABS: 2.5000 mg | ORAL_TABLET | ORAL | Status: DC | PRN

## 2012-05-28 MED ORDER — SERTRALINE HCL 100 MG PO TABS: 100.0000 mg | ORAL_TABLET | Freq: Every day | ORAL | Status: DC

## 2012-05-28 MED ORDER — HYDROCODONE-ACETAMINOPHEN 7.5-750 MG PO TABS: 1.0000 | ORAL_TABLET | ORAL | Status: DC | PRN

## 2012-05-28 MED ORDER — ALPRAZOLAM 0.25 MG PO TABS: 0.2500 mg | ORAL_TABLET | Freq: Every evening | ORAL | Status: DC | PRN

## 2012-05-28 MED ORDER — METOPROLOL SUCCINATE ER 50 MG PO TB24: 50.0000 mg | ORAL_TABLET | Freq: Every day | ORAL | Status: DC

## 2012-05-28 MED ORDER — SUMATRIPTAN SUCCINATE 100 MG PO TABS: 100.0000 mg | ORAL_TABLET | Freq: Once | ORAL | Status: DC | PRN

## 2012-05-28 MED ORDER — BACLOFEN 10 MG PO TABS: 10.0000 mg | ORAL_TABLET | Freq: Three times a day (TID) | ORAL | Status: DC | PRN

## 2012-05-28 MED ORDER — ONDANSETRON HCL 8 MG PO TABS: 8.0000 mg | ORAL_TABLET | Freq: Three times a day (TID) | ORAL | Status: DC | PRN

## 2012-05-28 MED ORDER — TOPIRAMATE 100 MG PO TABS: 100.0000 mg | ORAL_TABLET | Freq: Two times a day (BID) | ORAL | Status: DC

## 2012-05-28 NOTE — Patient Instructions (Signed)

## 2012-05-28 NOTE — Progress Notes (Signed)
S: Pt is here for yearly migraine revisit. She is doing better with headaches in general. Would like to stay on migraine prophylaxis as before including Topamax and Toprol. She uses Zoloft for anxiety and xanax for sleep. When she gets migraine she uses Imitrex, Amerge, and Vicoden and Zofran for rescue. She is working and doing well in her life.  O: Alert, oriented NAD Neuro: negative Skin: warm and dry  A: Migraine,anxiety, insomnia  P: Refill meds and follow up ine year

## 2012-06-20 ENCOUNTER — Telehealth: Payer: Self-pay | Admitting: *Deleted

## 2012-06-20 NOTE — Telephone Encounter (Signed)
Pt called stating that when she was here to see Standley Dakins on 05/29/12 she was suppose to get RX's for Xanax and Vicodin ES Printed to take to the pharamcy.  These were not handed to pt and and she walked out without them.  These RX were called to [facility] per Linda's authorization.

## 2012-07-19 ENCOUNTER — Encounter: Payer: Self-pay | Admitting: *Deleted

## 2012-07-19 ENCOUNTER — Emergency Department
Admission: EM | Admit: 2012-07-19 | Discharge: 2012-07-19 | Disposition: A | Discharge status: Home / Self Care | Payer: 59 | Source: Home / Self Care

## 2012-07-19 DIAGNOSIS — R300 Dysuria: Secondary | ICD-10-CM

## 2012-07-19 DIAGNOSIS — N390 Urinary tract infection, site not specified: Secondary | ICD-10-CM

## 2012-07-19 LAB — POCT URINALYSIS DIP (MANUAL ENTRY)
Blood, UA: NEGATIVE
Ketones, POC UA: NEGATIVE
Protein Ur, POC: 30
Spec Grav, UA: 1.025 (ref 1.005–1.03)
pH, UA: 5.5 (ref 5–8)

## 2012-07-19 MED ORDER — CEPHALEXIN 500 MG PO CAPS: 500.0000 mg | ORAL_CAPSULE | Freq: Three times a day (TID) | ORAL | Status: AC

## 2012-07-19 NOTE — ED Provider Notes (Signed)
History     CSN: [id number]  Arrival date & time 07/19/12  0815   None     No chief complaint on file.  HPI DYSURIA Onset:  1 day  Description: dysuria Modifying factors: hx/o recurrent UTI, feels like previous UTIs.   Symptoms Urgency:  mild Frequency: no  Hesitancy:  no Hematuria:  no Flank Pain:  no Fever: no Nausea/Vomiting:  no Missed LMP: no STD exposure: no Discharge: no Irritants: no Rash: no  Red Flags   More than 3 UTI's last 12 months:  yes PMH of  Diabetes or Immunosuppression:  no Renal Disease/Calculi: no Urinary Tract Abnormality:  no Instrumentation or Trauma: no    Past Medical History  Diagnosis Date  . Migraines   . Infertility, female     Past Surgical History  Procedure Date  . Shoulder surgery     Left  . Ankle surgery     Left    Family History  Problem Relation Age of Onset  . Hypertension Mother   . Cancer Maternal Grandmother   . Stroke      History  Substance Use Topics  . Smoking status: Former Smoker    Types: Cigarettes    Quit date: 02/09/1992  . Smokeless tobacco: Never Used  . Alcohol Use: Yes     1-2 weekly    OB History    Grav Para Term Preterm Abortions TAB SAB Ect Mult Living   1 1 1       1       Review of Systems  All other systems reviewed and are negative.    Allergies  Review of patient's allergies indicates no known allergies.  Home Medications   Current Outpatient Rx  Name Route Sig Dispense Refill  . ALPRAZOLAM 0.25 MG PO TABS Oral Take 1 tablet (0.25 mg total) by mouth at bedtime as needed for sleep. 30 tablet 3  . BACLOFEN 10 MG PO TABS Oral Take 1 tablet (10 mg total) by mouth every 8 (eight) hours as needed. 30 each 2  . CEPHALEXIN 500 MG PO CAPS Oral Take 1 capsule (500 mg total) by mouth 3 (three) times daily. 21 capsule 0  . HYDROCODONE-ACETAMINOPHEN 7.5-750 MG PO TABS Oral Take 1 tablet by mouth every 4 (four) hours as needed. 20 tablet 0  . METOPROLOL SUCCINATE ER 50 MG PO  TB24 Oral Take 1 tablet (50 mg total) by mouth daily. 30 tablet 11  . NARATRIPTAN HCL 2.5 MG PO TABS Oral Take 1 tablet (2.5 mg total) by mouth as needed. Take one (1) tablet at onset of headache; if returns or does not resolve, may repeat after 4 hours; do not exceed five (5) mg in 24 hours. 10 tablet 11  . ONDANSETRON HCL 8 MG PO TABS Oral Take 1 tablet (8 mg total) by mouth every 8 (eight) hours as needed. 20 tablet 2  . SERTRALINE HCL 100 MG PO TABS Oral Take 1 tablet (100 mg total) by mouth daily. Take 1/2 po daily 30 tablet 11  . SUMATRIPTAN SUCCINATE 100 MG PO TABS Oral Take 1 tablet (100 mg total) by mouth once as needed for migraine. 9 tablet 11  . SUMATRIPTAN SUCCINATE 6 MG/0.5ML [place] SOLN Subcutaneous Inject 0.33 mLs (3.96 mg total) into the skin as needed. F 12 vial 1  . TOPIRAMATE 100 MG PO TABS Oral Take 1 tablet (100 mg total) by mouth 2 (two) times daily. 60 tablet 11  . TRAZODONE HCL 50 MG  PO TABS  TAKE 1/2 TO 1 TABLET BY MOUTH AT BEDTIME AS NEEDED FOR SLEEP 30 tablet 0    There were no vitals taken for this visit.  Physical Exam  Constitutional: She appears well-developed and well-nourished.  HENT:  Head: Normocephalic and atraumatic.  Eyes: Conjunctivae are normal. Pupils are equal, round, and reactive to light.  Neck: Normal range of motion. Neck supple.  Cardiovascular: Normal rate and regular rhythm.   Pulmonary/Chest: Effort normal and breath sounds normal.  Abdominal: Soft.  Musculoskeletal: Normal range of motion.  Neurological: She is alert.  Skin: Skin is warm.    ED Course  Procedures (including critical care time)   Labs Reviewed  URINE CULTURE   No results found.   No diagnosis found.    MDM  Will treat with keflex.  Urine culture. Discussed urology follow up as pt has had >3 UTIs in last 12 months.  Infectious red flags reviewed.         Floydene Flock, MD 07/19/12 [phone]

## 2012-07-19 NOTE — ED Notes (Signed)
Patient c/o bladder pain and dysuria x this am

## 2012-07-24 NOTE — ED Provider Notes (Signed)
Agree with exam, assessment, and plan.   Eliceo Gladu A Yittel Emrich, MD 07/24/12 1631 

## 2012-08-07 ENCOUNTER — Other Ambulatory Visit: Payer: Self-pay | Admitting: Family Medicine

## 2012-08-07 NOTE — Telephone Encounter (Signed)
Last seen 12/18/11 and filled 02/20/12 # 30. Please advise     KP 

## 2012-08-08 ENCOUNTER — Emergency Department (INDEPENDENT_AMBULATORY_CARE_PROVIDER_SITE_OTHER)
Admission: EM | Admit: 2012-08-08 | Discharge: 2012-08-08 | Disposition: A | Discharge status: Home / Self Care | Payer: Self-pay | Source: Home / Self Care

## 2012-08-08 DIAGNOSIS — G43909 Migraine, unspecified, not intractable, without status migrainosus: Secondary | ICD-10-CM

## 2012-08-08 NOTE — ED Notes (Signed)
Migraine since this am.  

## 2012-08-08 NOTE — ED Provider Notes (Signed)
History     CSN: [id number]  Arrival date & time 08/08/12  0814   First MD Initiated Contact with Patient 08/08/12 0820      No chief complaint on file.   HPI Comments: Has been recurrent since youth.  Currently on topamax chronically.  + photophobia and nausea.  No aura associated with this flare.  Has had auras in the past.  Headaches are usually temporal in origin.    Patient is a 51 y.o. female presenting with migraine. The history is provided by the patient.  Migraine This is a recurrent problem. Episode onset: this am  The problem occurs constantly. The problem has not changed since onset.Associated symptoms include headaches. Pertinent negatives include no chest pain, no abdominal pain and no shortness of breath. Exacerbated by: activity  The symptoms are relieved by rest. Treatments tried: Has imitrex at home. Forgot to take. Would like to avoid taking while at work.   Pt would like to avoid using imitrex as this makes her feel nauseous and "not herself" while at work today.   Past Medical History  Diagnosis Date  . Migraines   . Infertility, female     Past Surgical History  Procedure Date  . Shoulder surgery     Left  . Ankle surgery     Left    Family History  Problem Relation Age of Onset  . Hypertension Mother   . Cancer Maternal Grandmother   . Stroke      History  Substance Use Topics  . Smoking status: Former Smoker    Types: Cigarettes    Quit date: 02/09/1992  . Smokeless tobacco: Never Used  . Alcohol Use: Yes     1-2 weekly    OB History    Grav Para Term Preterm Abortions TAB SAB Ect Mult Living   1 1 1       1       Review of Systems  Respiratory: Negative for shortness of breath.   Cardiovascular: Negative for chest pain.  Gastrointestinal: Negative for abdominal pain.  Neurological: Positive for headaches.  All other systems reviewed and are negative.    Allergies  Review of patient's allergies indicates no known  allergies.  Home Medications   Current Outpatient Rx  Name Route Sig Dispense Refill  . ALPRAZOLAM 0.25 MG PO TABS Oral Take 1 tablet (0.25 mg total) by mouth at bedtime as needed for sleep. 30 tablet 3  . BACLOFEN 10 MG PO TABS Oral Take 1 tablet (10 mg total) by mouth every 8 (eight) hours as needed. 30 each 2  . HYDROCODONE-ACETAMINOPHEN 7.5-750 MG PO TABS Oral Take 1 tablet by mouth every 4 (four) hours as needed. 20 tablet 0  . METOPROLOL SUCCINATE ER 50 MG PO TB24 Oral Take 1 tablet (50 mg total) by mouth daily. 30 tablet 11  . NARATRIPTAN HCL 2.5 MG PO TABS Oral Take 1 tablet (2.5 mg total) by mouth as needed. Take one (1) tablet at onset of headache; if returns or does not resolve, may repeat after 4 hours; do not exceed five (5) mg in 24 hours. 10 tablet 11  . ONDANSETRON HCL 8 MG PO TABS Oral Take 1 tablet (8 mg total) by mouth every 8 (eight) hours as needed. 20 tablet 2  . SERTRALINE HCL 100 MG PO TABS Oral Take 1 tablet (100 mg total) by mouth daily. Take 1/2 po daily 30 tablet 11  . SUMATRIPTAN SUCCINATE 100 MG PO TABS Oral Take  1 tablet (100 mg total) by mouth once as needed for migraine. 9 tablet 11  . SUMATRIPTAN SUCCINATE 6 MG/0.5ML [place] SOLN Subcutaneous Inject 0.33 mLs (3.96 mg total) into the skin as needed. F 12 vial 1  . TOPIRAMATE 100 MG PO TABS Oral Take 1 tablet (100 mg total) by mouth 2 (two) times daily. 60 tablet 11  . TRAZODONE HCL 50 MG PO TABS  TAKE 1/2 TO 1 TABLET BY MOUTH AT BEDTIME AS NEEDED FOR SLEEP 30 tablet 1    There were no vitals taken for this visit.  Physical Exam  Constitutional: She appears well-developed and well-nourished.  HENT:  Head: Normocephalic and atraumatic.  Eyes: Conjunctivae are normal. Pupils are equal, round, and reactive to light.  Neck: Normal range of motion. Neck supple.  Cardiovascular: Normal rate and regular rhythm.   Pulmonary/Chest: Effort normal and breath sounds normal.  Abdominal: Soft. Bowel sounds are normal.   Musculoskeletal: Normal range of motion.  Neurological: She is alert. No cranial nerve deficit.  Skin: Skin is warm.    ED Course  Procedures (including critical care time)  Labs Reviewed - No data to display No results found.   1. Migraine       MDM  Flare of this today.  Will treat this with toradol as pt is currently working.  May repeat in 6-8 hours if necessary.  Broached red flags for reevaluation.  Would consider abortive treatment after work.      The patient and/or caregiver has been counseled thoroughly with regard to treatment plan and/or medications prescribed including dosage, schedule, interactions, rationale for use, and possible side effects and they verbalize understanding. Diagnoses and expected course of recovery discussed and will return if not improved as expected or if the condition worsens. Patient and/or caregiver verbalized understanding.            Doree Albee, MD 08/08/12 [phone]

## 2012-08-14 ENCOUNTER — Other Ambulatory Visit: Payer: Self-pay | Admitting: *Deleted

## 2012-08-14 DIAGNOSIS — G43909 Migraine, unspecified, not intractable, without status migrainosus: Secondary | ICD-10-CM

## 2012-08-14 MED ORDER — HYDROCODONE-ACETAMINOPHEN 7.5-750 MG PO TABS: 1.0000 | ORAL_TABLET | ORAL | Status: DC | PRN

## 2012-08-14 NOTE — Telephone Encounter (Signed)
Pt called requesting RF on vicodin be called to [facility].  She takes this for her migraines.  OK'd per Jannifer Rodney, NP

## 2012-10-02 ENCOUNTER — Other Ambulatory Visit: Payer: Self-pay | Admitting: *Deleted

## 2012-10-02 DIAGNOSIS — G43909 Migraine, unspecified, not intractable, without status migrainosus: Secondary | ICD-10-CM

## 2012-10-02 MED ORDER — NARATRIPTAN HCL 2.5 MG PO TABS: 2.5000 mg | ORAL_TABLET | ORAL | Status: DC | PRN

## 2012-10-02 MED ORDER — HYDROCODONE-ACETAMINOPHEN 7.5-750 MG PO TABS: 1.0000 | ORAL_TABLET | ORAL | Status: DC | PRN

## 2012-10-02 NOTE — Telephone Encounter (Signed)
Pt called requesting RF on Amerge and her Vicodin ES.  Per Mcarthur Rossetti, NP ok'd and called to [facility] outpatient pharmacy.

## 2012-11-29 ENCOUNTER — Other Ambulatory Visit: Payer: Self-pay | Admitting: *Deleted

## 2012-11-29 DIAGNOSIS — G43909 Migraine, unspecified, not intractable, without status migrainosus: Secondary | ICD-10-CM

## 2012-11-29 MED ORDER — HYDROCODONE-ACETAMINOPHEN 7.5-750 MG PO TABS: 1.0000 | ORAL_TABLET | ORAL | Status: DC | PRN

## 2012-11-29 NOTE — Telephone Encounter (Signed)
Pt called requesting RF on Vicopdin ES for migraines.  One RF given per Jannifer Rodney, NP

## 2013-01-02 ENCOUNTER — Encounter: Payer: 59 | Admitting: Women's Health

## 2013-01-09 ENCOUNTER — Telehealth: Payer: Self-pay | Admitting: Family Medicine

## 2013-01-09 MED ORDER — HYDROCOD POLST-CHLORPHEN POLST 10-8 MG/5ML PO LQCR: 5.0000 mL | Freq: Two times a day (BID) | ORAL | Status: DC | PRN

## 2013-01-09 MED ORDER — AZITHROMYCIN 250 MG PO TABS: ORAL_TABLET | ORAL | Status: DC

## 2013-01-09 NOTE — ED Notes (Signed)
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 Patient with noted cough for the past 2 weeks. Initially had flulike symptoms 2 weeks that mildly improved. However cough has persisted. Patient was given a shot of Toradol earlier in the day to help with symptoms however cough is still persistent. No shortness of breath. No fever. No wheezing. Lungs are clear on exam. Plan to place patient on Z-Pak for atypical coverage. Tussionex for cough. Discuss infectious and respiratory red plaques.  Doree Albee, MD 01/09/13 2015

## 2013-01-15 ENCOUNTER — Other Ambulatory Visit: Payer: Self-pay | Admitting: *Deleted

## 2013-01-15 ENCOUNTER — Other Ambulatory Visit: Payer: Self-pay | Admitting: Family Medicine

## 2013-01-15 DIAGNOSIS — G43909 Migraine, unspecified, not intractable, without status migrainosus: Secondary | ICD-10-CM

## 2013-01-15 MED ORDER — BACLOFEN 10 MG PO TABS: 10.0000 mg | ORAL_TABLET | Freq: Three times a day (TID) | ORAL | Status: DC | PRN

## 2013-01-15 MED ORDER — ALPRAZOLAM 0.25 MG PO TABS: 0.2500 mg | ORAL_TABLET | Freq: Every evening | ORAL | Status: DC | PRN

## 2013-01-15 MED ORDER — ONDANSETRON HCL 8 MG PO TABS: 8.0000 mg | ORAL_TABLET | Freq: Three times a day (TID) | ORAL | Status: DC | PRN

## 2013-01-15 NOTE — Telephone Encounter (Signed)
Last seen 12/18/11 and filled 02/20/12 # 30. Please advise     KP

## 2013-01-15 NOTE — Telephone Encounter (Signed)
Pt called requesting a RF on Xanax, Zofran and Baclofen.  OK's per Jannifer Rodney, NP.

## 2013-01-15 NOTE — Telephone Encounter (Signed)
Pt due for ov--- no more refills until seen

## 2013-01-22 ENCOUNTER — Telehealth: Payer: Self-pay | Admitting: Family Medicine

## 2013-01-22 MED ORDER — NEOMYCIN-POLYMYXIN-HC 3.5-10000-1 OT SOLN: 3.0000 [drp] | Freq: Four times a day (QID) | OTIC | Status: AC

## 2013-01-22 NOTE — ED Notes (Signed)
Pt with L ear pain x 3-4 days. Also with mild ear ringing. No fevers or chills.  Pt with L ear canal erythema and tenderness to otoscopic evaluation on exam.  Will place on cortisporin otic for treatment.  Discussed general red flags.    Doree Albee, MD 01/22/13 1006

## 2013-01-25 ENCOUNTER — Other Ambulatory Visit: Payer: Self-pay

## 2013-02-20 ENCOUNTER — Telehealth: Payer: Self-pay

## 2013-02-26 NOTE — Telephone Encounter (Signed)
CALL

## 2013-03-03 ENCOUNTER — Telehealth: Payer: Self-pay | Admitting: *Deleted

## 2013-03-03 NOTE — Telephone Encounter (Signed)
Called [facility] to call in Rx for Vicodin - They no longer do 7/750 - only offer 7.5/325 - Reported okay to 7.5/325 #30 po q4h per Jannifer Rodney, NP.

## 2013-03-19 ENCOUNTER — Other Ambulatory Visit: Payer: Self-pay | Admitting: Nurse Practitioner

## 2013-04-04 ENCOUNTER — Other Ambulatory Visit: Payer: Self-pay

## 2013-04-04 DIAGNOSIS — Z1231 Encounter for screening mammogram for malignant neoplasm of breast: Secondary | ICD-10-CM

## 2013-04-29 ENCOUNTER — Ambulatory Visit
Admission: RE | Admit: 2013-04-29 | Discharge: 2013-04-29 | Disposition: A | Discharge status: Home / Self Care | Payer: 59 | Source: Ambulatory Visit

## 2013-04-29 DIAGNOSIS — Z1231 Encounter for screening mammogram for malignant neoplasm of breast: Secondary | ICD-10-CM

## 2013-05-01 ENCOUNTER — Telehealth: Payer: Self-pay | Admitting: Family Medicine

## 2013-05-01 NOTE — ED Notes (Signed)
Migraine flare x 1 day.  Has imitrex x 3 over last 34-26 hours.  Still with mild pain.  Mild nausea.  No hemiparesis or confusion.  Pt given decadron 10mg  IM x1 as well as pill of phenergan to take at home.  Continue imitrex.  Follow up with PCP if sxs not improved.  Doree Albee, MD 05/01/13 [id number]

## 2013-05-15 ENCOUNTER — Other Ambulatory Visit: Payer: Self-pay | Admitting: *Deleted

## 2013-05-15 DIAGNOSIS — G43909 Migraine, unspecified, not intractable, without status migrainosus: Secondary | ICD-10-CM

## 2013-05-15 MED ORDER — HYDROCODONE-ACETAMINOPHEN 7.5-750 MG PO TABS: 1.0000 | ORAL_TABLET | ORAL | Status: DC | PRN

## 2013-05-15 NOTE — Telephone Encounter (Signed)
Pt called requesting a RF on her Vicodin ES for her migraines.  Ok'd x 1 per Feliberto Gottron.  Pt is scheduled for an appt with Bonita Quin in July.

## 2013-06-10 ENCOUNTER — Other Ambulatory Visit: Payer: Self-pay | Admitting: Family Medicine

## 2013-06-10 ENCOUNTER — Ambulatory Visit (INDEPENDENT_AMBULATORY_CARE_PROVIDER_SITE_OTHER): Payer: 59 | Admitting: Nurse Practitioner

## 2013-06-10 ENCOUNTER — Encounter: Payer: Self-pay | Admitting: Nurse Practitioner

## 2013-06-10 VITALS — BP 141/78 | HR 60 | Resp 16 | Ht 65.0 in | Wt 168.0 lb

## 2013-06-10 DIAGNOSIS — G4700 Insomnia, unspecified: Secondary | ICD-10-CM

## 2013-06-10 DIAGNOSIS — M62838 Other muscle spasm: Secondary | ICD-10-CM

## 2013-06-10 DIAGNOSIS — G43909 Migraine, unspecified, not intractable, without status migrainosus: Secondary | ICD-10-CM

## 2013-06-10 MED ORDER — NARATRIPTAN HCL 2.5 MG PO TABS: 2.5000 mg | ORAL_TABLET | ORAL | Status: DC | PRN

## 2013-06-10 MED ORDER — ALPRAZOLAM 0.25 MG PO TABS: 0.2500 mg | ORAL_TABLET | Freq: Every evening | ORAL | Status: DC | PRN

## 2013-06-10 MED ORDER — ONDANSETRON HCL 8 MG PO TABS: 8.0000 mg | ORAL_TABLET | Freq: Three times a day (TID) | ORAL | Status: DC | PRN

## 2013-06-10 MED ORDER — METOPROLOL SUCCINATE ER 50 MG PO TB24: 50.0000 mg | ORAL_TABLET | Freq: Every day | ORAL | Status: DC

## 2013-06-10 MED ORDER — SERTRALINE HCL 100 MG PO TABS: 100.0000 mg | ORAL_TABLET | Freq: Every day | ORAL | Status: DC

## 2013-06-10 MED ORDER — TOPIRAMATE 100 MG PO TABS: 100.0000 mg | ORAL_TABLET | Freq: Two times a day (BID) | ORAL | Status: DC

## 2013-06-10 MED ORDER — HYDROCODONE-ACETAMINOPHEN 7.5-325 MG PO TABS: 1.0000 | ORAL_TABLET | Freq: Four times a day (QID) | ORAL | Status: DC | PRN

## 2013-06-10 MED ORDER — SUMATRIPTAN SUCCINATE 100 MG PO TABS: 100.0000 mg | ORAL_TABLET | Freq: Once | ORAL | Status: DC | PRN

## 2013-06-10 MED ORDER — ONABOTULINUMTOXINA 100 UNITS IJ SOLR: 100.0000 [IU] | Freq: Once | INTRAMUSCULAR | Status: DC

## 2013-06-10 NOTE — Progress Notes (Signed)
S: Pt is in office today for yearly follow up. She has not been doing well since April when she suffered with her allergies. Her PCP put her on a prednisone taper which helped for a short while only. She remains on preventives topamax, Toprol and Zoloft. She feels she needs to go up on Zoloft to 100mg . We also discussed her going back on Botox injections and she was excited about that possibility. She was last on Botox about 5 years ago. She was doing so well with her migraines that she got off most of her medications and then her insurance started denying her. She is currently at Severe 10, Moderate 10, Milds 10. She is also complaining her muscle relaxer is not working as well as she would like.  O: Alert, Oriented, NAD Cardiac: RRR Lungs: Clear Skin: warm and dry  A: Migraine Muscle spasm  Insomnia  P: We will refill meds and go up on Zoloft to 100 mg She will RTC next week for Botox

## 2013-06-10 NOTE — Patient Instructions (Addendum)
Migraine Headache A migraine headache is an intense, throbbing pain on one or both sides of your head. A migraine can last for 30 minutes to several hours. CAUSES  The exact cause of a migraine headache is not always known. However, a migraine may be caused when nerves in the brain become irritated and release chemicals that cause inflammation. This causes pain. SYMPTOMS  Pain on one or both sides of your head.  Pulsating or throbbing pain.  Severe pain that prevents daily activities.  Pain that is aggravated by any physical activity.  Nausea, vomiting, or both.  Dizziness.  Pain with exposure to bright lights, loud noises, or activity.  General sensitivity to bright lights, loud noises, or smells. Before you get a migraine, you may get warning signs that a migraine is coming ([place]). An [place] may include:  Seeing flashing lights.  Seeing bright spots, halos, or zig-zag lines.  Having tunnel vision or blurred vision.  Having feelings of numbness or tingling.  Having trouble talking.  Having muscle weakness. MIGRAINE TRIGGERS  Alcohol.  Smoking.  Stress.  Menstruation.  Aged cheeses.  Foods or drinks that contain nitrates, glutamate, aspartame, or tyramine.  Lack of sleep.  Chocolate.  Caffeine.  Hunger.  Physical exertion.  Fatigue.  Medicines used to treat chest pain (nitroglycerine), birth control pills, estrogen, and some blood pressure medicines. DIAGNOSIS  A migraine headache is often diagnosed based on:  Symptoms.  Physical examination.  A CT scan or MRI of your head. TREATMENT Medicines may be given for pain and nausea. Medicines can also be given to help prevent recurrent migraines.  HOME CARE INSTRUCTIONS  Only take over-the-counter or prescription medicines for pain or discomfort as directed by your caregiver. The use of long-term narcotics is not recommended.  Lie down in a dark, quiet room when you have a migraine.  Keep a journal  to find out what may trigger your migraine headaches. For example, write down:  What you eat and drink.  How much sleep you get.  Any change to your diet or medicines.  Limit alcohol consumption.  Quit smoking if you smoke.  Get 7 to 9 hours of sleep, or as recommended by your caregiver.  Limit stress.  Keep lights dim if bright lights bother you and make your migraines worse. SEEK IMMEDIATE MEDICAL CARE IF:   Your migraine becomes severe.  You have a fever.  You have a stiff neck.  You have vision loss.  You have muscular weakness or loss of muscle control.  You start losing your balance or have trouble walking.  You feel faint or pass out.  You have severe symptoms that are different from your first symptoms. MAKE SURE YOU:   Understand these instructions.  Will watch your condition.  Will get help right away if you are not doing well or get worse. Document Released: 11/27/2005 Document Revised: 02/19/2012 Document Reviewed: 11/17/2011 ExitCare Patient Information 2014 [place], [place]. 1800 Calorie Diet for Diabetes Meal Planning The 1800 calorie diet is designed for eating up to 1800 calories each day. Following this diet and making healthy meal choices can help improve overall health. This diet controls blood sugar (glucose) levels and can also help lower blood pressure and cholesterol. SERVING SIZES Measuring foods and serving sizes helps to make sure you are getting the right amount of food. The list below tells how big or small some common serving sizes are:  1 oz.........4 stacked dice.  3 oz........Marland KitchenDeck of cards.  1 tsp.......Marland KitchenTip  of little finger.  1 tbs......Marland KitchenMarland KitchenThumb.  2 tbs.......Marland KitchenGolf ball.   cup......Marland KitchenHalf of a fist.  1 cup.......Marland KitchenA fist. GUIDELINES FOR CHOOSING FOODS The goal of this diet is to eat a variety of foods and limit calories to 1800 each day. This can be done by choosing foods that are low in calories and fat. The diet also  suggests eating small amounts of food frequently. Doing this helps control your blood glucose levels so they do not get too high or too low. Each meal or snack may include a protein food source to help you feel more satisfied and to stabilize your blood glucose. Try to eat about the same amount of food around the same time each day. This includes weekend days, travel days, and days off work. Space your meals about 4 to 5 hours apart and add a snack between them if you wish.  For example, a daily food plan could include breakfast, a morning snack, lunch, dinner, and an evening snack. Healthy meals and snacks include whole grains, vegetables, fruits, lean meats, poultry, fish, and dairy products. As you plan your meals, select a variety of foods. Choose from the bread and starch, vegetable, fruit, dairy, and meat/protein groups. Examples of foods from each group and their suggested serving sizes are listed below. Use measuring cups and spoons to become familiar with what a healthy portion looks like. Bread and Starch Each serving equals 15 grams of carbohydrates.  1 slice bread.   bagel.   cup cold cereal (unsweetened).   cup hot cereal or mashed potatoes.  1 small potato (size of a computer mouse).   cup cooked pasta or rice.   English muffin.  1 cup broth-based soup.  3 cups of popcorn.  4 to 6 whole-wheat crackers.   cup cooked beans, peas, or corn. Vegetable Each serving equals 5 grams of carbohydrates.   cup cooked vegetables.  1 cup raw vegetables.   cup tomato or vegetable juice. Fruit Each serving equals 15 grams of carbohydrates.  1 small apple or orange.  1 cup watermelon or strawberries.   cup applesauce (no sugar added).  2 tbs raisins.   banana.   cup canned fruit, packed in water, its own juice, or sweetened with a sugar substitute.   cup unsweetened fruit juice. Dairy Each serving equals 12 to 15 grams of carbohydrates.  1 cup fat-free  milk.  6 oz artificially sweetened yogurt or plain yogurt.  1 cup low-fat buttermilk.  1 cup soy milk.  1 cup almond milk. Meat/Protein  1 large egg.  2 to 3 oz meat, poultry, or fish.   cup low-fat cottage cheese.  1 tbs peanut butter.  1 oz low-fat cheese.   cup tuna in water.   cup tofu. Fat  1 tsp oil.  1 tsp trans-fat-free margarine.  1 tsp butter.  1 tsp mayonnaise.  2 tbs avocado.  1 tbs salad dressing.  1 tbs cream cheese.  2 tbs sour cream. SAMPLE 1800 CALORIE DIET PLAN Breakfast   cup unsweetened cereal (1 carb serving).  1 cup fat-free milk (1 carb serving).  1 slice whole-wheat toast (1 carb serving).   small banana (1 carb serving).  1 scrambled egg.  1 tsp trans-fat-free margarine. Lunch  Tuna sandwich.  2 slices whole-wheat bread (2 carb servings).   cup canned tuna in water, drained.  1 tbs reduced fat mayonnaise.  1 stalk celery, chopped.  2 slices tomato.  1 lettuce leaf.  1 cup carrot  sticks.  24 to 30 seedless grapes (2 carb servings).  6 oz light yogurt (1 carb serving). Afternoon Snack  3 graham cracker squares (1 carb serving).  Fat-free milk, 1 cup (1 carb serving).  1 tbs peanut butter. Dinner  3 oz salmon, broiled with 1 tsp oil.  1 cup mashed potatoes (2 carb servings) with 1 tsp trans-fat-free margarine.  1 cup fresh or frozen green beans.  1 cup steamed asparagus.  1 cup fat-free milk (1 carb serving). Evening Snack  3 cups air-popped popcorn (1 carb serving).  2 tbs parmesan cheese sprinkled on top. MEAL PLAN Use this worksheet to help you make a daily meal plan based on the 1800 calorie diet suggestions. If you are using this plan to help you control your blood glucose, you may interchange carbohydrate-containing foods (dairy, starches, and fruits). Select a variety of fresh foods of varying colors and flavors. The total amount of carbohydrate in your meals or snacks is more  important than making sure you include all of the food groups every time you eat. Choose from the following foods to build your day's meals:  8 Starches.  4 Vegetables.  3 Fruits.  2 Dairy.  6 to 7 oz Meat/Protein.  Up to 4 Fats. Your dietician can use this worksheet to help you decide how many servings and which types of foods are right for you. BREAKFAST Food Group and Servings / Food Choice Starch ________________________________________________________ Dairy _________________________________________________________ Fruit _________________________________________________________ Meat/Protein __________________________________________________ Fat ___________________________________________________________ LUNCH Food Group and Servings / Food Choice Starch ________________________________________________________ Meat/Protein __________________________________________________ Vegetable _____________________________________________________ Fruit _________________________________________________________ Dairy _________________________________________________________ Fat ___________________________________________________________ [name] Food Group and Servings / Food Choice Starch ________________________________________________________ Meat/Protein __________________________________________________ Fruit __________________________________________________________ Dairy _________________________________________________________ [name] Food Group and Servings / Food Choice Starch _________________________________________________________ Meat/Protein ___________________________________________________ Dairy __________________________________________________________ Vegetable ______________________________________________________ Fruit ___________________________________________________________ Fat ____________________________________________________________ [name] Food Group and Servings / Food Choice Fruit __________________________________________________________ Meat/Protein ___________________________________________________ Dairy __________________________________________________________ Starch _________________________________________________________ DAILY TOTALS Starch ____________________________ Vegetable _________________________ Fruit _____________________________ Dairy _____________________________ Meat/Protein______________________ Fat _______________________________ Document Released: 06/19/2005 Document Revised: 02/19/2012 Document Reviewed: 10/13/2011 ExitCare Patient Information 2014 [employer].

## 2013-06-17 ENCOUNTER — Ambulatory Visit (INDEPENDENT_AMBULATORY_CARE_PROVIDER_SITE_OTHER): Payer: 59 | Admitting: Nurse Practitioner

## 2013-06-17 ENCOUNTER — Encounter: Payer: Self-pay | Admitting: Nurse Practitioner

## 2013-06-17 VITALS — BP 128/94 | HR 69 | Ht 64.0 in | Wt 165.0 lb

## 2013-06-17 DIAGNOSIS — G43909 Migraine, unspecified, not intractable, without status migrainosus: Secondary | ICD-10-CM

## 2013-06-17 DIAGNOSIS — M62838 Other muscle spasm: Secondary | ICD-10-CM

## 2013-06-17 NOTE — Patient Instructions (Signed)
Migraine Headache A migraine headache is an intense, throbbing pain on one or both sides of your head. A migraine can last for 30 minutes to several hours. CAUSES  The exact cause of a migraine headache is not always known. However, a migraine may be caused when nerves in the brain become irritated and release chemicals that cause inflammation. This causes pain. SYMPTOMS  Pain on one or both sides of your head.  Pulsating or throbbing pain.  Severe pain that prevents daily activities.  Pain that is aggravated by any physical activity.  Nausea, vomiting, or both.  Dizziness.  Pain with exposure to bright lights, loud noises, or activity.  General sensitivity to bright lights, loud noises, or smells. Before you get a migraine, you may get warning signs that a migraine is coming (aura). An aura may include:  Seeing flashing lights.  Seeing bright spots, halos, or zig-zag lines.  Having tunnel vision or blurred vision.  Having feelings of numbness or tingling.  Having trouble talking.  Having muscle weakness. MIGRAINE TRIGGERS  Alcohol.  Smoking.  Stress.  Menstruation.  Aged cheeses.  Foods or drinks that contain nitrates, glutamate, aspartame, or tyramine.  Lack of sleep.  Chocolate.  Caffeine.  Hunger.  Physical exertion.  Fatigue.  Medicines used to treat chest pain (nitroglycerine), birth control pills, estrogen, and some blood pressure medicines. DIAGNOSIS  A migraine headache is often diagnosed based on:  Symptoms.  Physical examination.  A CT scan or MRI of your head. TREATMENT Medicines may be given for pain and nausea. Medicines can also be given to help prevent recurrent migraines.  HOME CARE INSTRUCTIONS  Only take over-the-counter or prescription medicines for pain or discomfort as directed by your caregiver. The use of long-term narcotics is not recommended.  Lie down in a dark, quiet room when you have a migraine.  Keep a journal  to find out what may trigger your migraine headaches. For example, write down:  What you eat and drink.  How much sleep you get.  Any change to your diet or medicines.  Limit alcohol consumption.  Quit smoking if you smoke.  Get 7 to 9 hours of sleep, or as recommended by your caregiver.  Limit stress.  Keep lights dim if bright lights bother you and make your migraines worse. SEEK IMMEDIATE MEDICAL CARE IF:   Your migraine becomes severe.  You have a fever.  You have a stiff neck.  You have vision loss.  You have muscular weakness or loss of muscle control.  You start losing your balance or have trouble walking.  You feel faint or pass out.  You have severe symptoms that are different from your first symptoms. MAKE SURE YOU:   Understand these instructions.  Will watch your condition.  Will get help right away if you are not doing well or get worse. Document Released: 11/27/2005 Document Revised: 02/19/2012 Document Reviewed: 11/17/2011 ExitCare Patient Information 2014 [employer].  

## 2013-06-17 NOTE — Progress Notes (Signed)
S: Pt in office today for Botox injections. This is her first Botox in many years. She has done very well on Botox in the past.    O: Alert, Oriented, NAD M/S: Tenderness in temples and traps bilateral  Botox Procedure Note Lot # [id number] Expiration Date Jan 2017  Botox Dosing by [employer] for Chronic Migraine   Injection Sites for Migraines  Botox 100 units was injected using the dosage in the table above in the pattern shown above.  A: Migraine  Muscle spasm   P: Botox 100 units injected today.  RTC 3 Months.

## 2013-08-12 ENCOUNTER — Other Ambulatory Visit: Payer: Self-pay | Admitting: Nurse Practitioner

## 2013-08-14 ENCOUNTER — Other Ambulatory Visit: Payer: Self-pay | Admitting: *Deleted

## 2013-08-14 DIAGNOSIS — G43909 Migraine, unspecified, not intractable, without status migrainosus: Secondary | ICD-10-CM

## 2013-08-14 MED ORDER — BACLOFEN 10 MG PO TABS: 10.0000 mg | ORAL_TABLET | Freq: Three times a day (TID) | ORAL | Status: DC | PRN

## 2013-08-14 NOTE — Telephone Encounter (Signed)
RF on Baclofen 10 mg sent to pharmacy [facility] by Jannifer Rodney, NP

## 2013-09-23 ENCOUNTER — Telehealth: Payer: Self-pay | Admitting: Nurse Practitioner

## 2013-09-23 ENCOUNTER — Encounter: Payer: 59 | Admitting: Nurse Practitioner

## 2013-09-23 MED ORDER — HYDROCODONE-ACETAMINOPHEN 7.5-325 MG PO TABS: 1.0000 | ORAL_TABLET | Freq: Four times a day (QID) | ORAL | Status: DC | PRN

## 2013-09-23 NOTE — Telephone Encounter (Signed)
Pt was unable to keep appointment today because Botox was not available. She needs refill on Vicodin. Will refill # 30/ 0 refills

## 2013-10-07 ENCOUNTER — Encounter: Payer: Self-pay | Admitting: Nurse Practitioner

## 2013-10-07 ENCOUNTER — Ambulatory Visit (INDEPENDENT_AMBULATORY_CARE_PROVIDER_SITE_OTHER): Payer: 59 | Admitting: Nurse Practitioner

## 2013-10-07 VITALS — BP 128/89 | HR 61 | Ht 65.0 in | Wt 168.0 lb

## 2013-10-07 DIAGNOSIS — G43909 Migraine, unspecified, not intractable, without status migrainosus: Secondary | ICD-10-CM

## 2013-10-07 DIAGNOSIS — G4700 Insomnia, unspecified: Secondary | ICD-10-CM

## 2013-10-07 DIAGNOSIS — M62838 Other muscle spasm: Secondary | ICD-10-CM

## 2013-10-07 NOTE — Progress Notes (Signed)
[name] in office today for Botox injections. This is her second round of Botox.She is using 100 units as needed.  She did not notice a big difference in migraines this past 3 months. She has noticed a new type of pain that is bilateral forehead and occurs with sudden movements and happens about every 2 weeks. She reports 10 severe, 5 moderate, 5 milds. She is requesting an MRI of head and neck for this new pain and chronic pain in neck.   O: General: alert, oriented, NAD Muscles: Neck and shoulders are very tight Neuro: S/P 2 day migraine  Skin: warm and dry  Botox Procedure Note Lot # [id number] Expiration Date May 2017  Botox Dosing by [employer] for Chronic Migraine   Injection Sites for Migraines  Botox 100 units was injected using the dosage in the table above in the pattern shown above.  A: Migraine  Muscle spasm   P: Botox 100 units injected today.  MRI of head and neck RTC 3 Months.

## 2013-10-07 NOTE — Patient Instructions (Signed)
Migraine Headache A migraine headache is an intense, throbbing pain on one or both sides of your head. A migraine can last for 30 minutes to several hours. CAUSES  The exact cause of a migraine headache is not always known. However, a migraine may be caused when nerves in the brain become irritated and release chemicals that cause inflammation. This causes pain. SYMPTOMS  Pain on one or both sides of your head.  Pulsating or throbbing pain.  Severe pain that prevents daily activities.  Pain that is aggravated by any physical activity.  Nausea, vomiting, or both.  Dizziness.  Pain with exposure to bright lights, loud noises, or activity.  General sensitivity to bright lights, loud noises, or smells. Before you get a migraine, you may get warning signs that a migraine is coming (aura). An aura may include:  Seeing flashing lights.  Seeing bright spots, halos, or zig-zag lines.  Having tunnel vision or blurred vision.  Having feelings of numbness or tingling.  Having trouble talking.  Having muscle weakness. MIGRAINE TRIGGERS  Alcohol.  Smoking.  Stress.  Menstruation.  Aged cheeses.  Foods or drinks that contain nitrates, glutamate, aspartame, or tyramine.  Lack of sleep.  Chocolate.  Caffeine.  Hunger.  Physical exertion.  Fatigue.  Medicines used to treat chest pain (nitroglycerine), birth control pills, estrogen, and some blood pressure medicines. DIAGNOSIS  A migraine headache is often diagnosed based on:  Symptoms.  Physical examination.  A CT scan or MRI of your head. TREATMENT Medicines may be given for pain and nausea. Medicines can also be given to help prevent recurrent migraines.  HOME CARE INSTRUCTIONS  Only take over-the-counter or prescription medicines for pain or discomfort as directed by your caregiver. The use of long-term narcotics is not recommended.  Lie down in a dark, quiet room when you have a migraine.  Keep a journal  to find out what may trigger your migraine headaches. For example, write down:  What you eat and drink.  How much sleep you get.  Any change to your diet or medicines.  Limit alcohol consumption.  Quit smoking if you smoke.  Get 7 to 9 hours of sleep, or as recommended by your caregiver.  Limit stress.  Keep lights dim if bright lights bother you and make your migraines worse. SEEK IMMEDIATE MEDICAL CARE IF:   Your migraine becomes severe.  You have a fever.  You have a stiff neck.  You have vision loss.  You have muscular weakness or loss of muscle control.  You start losing your balance or have trouble walking.  You feel faint or pass out.  You have severe symptoms that are different from your first symptoms. MAKE SURE YOU:   Understand these instructions.  Will watch your condition.  Will get help right away if you are not doing well or get worse. Document Released: 11/27/2005 Document Revised: 02/19/2012 Document Reviewed: 11/17/2011 ExitCare Patient Information 2014 [employer].  

## 2013-10-16 ENCOUNTER — Other Ambulatory Visit: Payer: Self-pay

## 2013-10-29 ENCOUNTER — Other Ambulatory Visit: Payer: Self-pay | Admitting: Nurse Practitioner

## 2013-10-29 ENCOUNTER — Telehealth: Payer: Self-pay | Admitting: *Deleted

## 2013-10-29 DIAGNOSIS — G43909 Migraine, unspecified, not intractable, without status migrainosus: Secondary | ICD-10-CM

## 2013-10-29 MED ORDER — NARATRIPTAN HCL 2.5 MG PO TABS: 2.5000 mg | ORAL_TABLET | ORAL | Status: DC | PRN

## 2013-10-29 NOTE — Telephone Encounter (Signed)
Pt called with request of Amerge.  Per Jannifer Rodney, NP may RF and sent to [facility].

## 2013-11-25 ENCOUNTER — Other Ambulatory Visit: Payer: Self-pay | Admitting: *Deleted

## 2013-11-25 ENCOUNTER — Telehealth: Payer: Self-pay | Admitting: Nurse Practitioner

## 2013-11-25 DIAGNOSIS — G43909 Migraine, unspecified, not intractable, without status migrainosus: Secondary | ICD-10-CM

## 2013-11-25 DIAGNOSIS — M542 Cervicalgia: Secondary | ICD-10-CM

## 2013-11-25 MED ORDER — HYDROCODONE-ACETAMINOPHEN 7.5-325 MG PO TABS: 1.0000 | ORAL_TABLET | Freq: Four times a day (QID) | ORAL | Status: DC | PRN

## 2013-11-25 NOTE — Telephone Encounter (Signed)
Pt request a RF on hydrocodone and appt made for MRI of head and neck due to chronic headaches and new onset of neck pain per Feliberto Gottron.

## 2013-11-25 NOTE — Telephone Encounter (Signed)
Will mail RX to patient for Norco 7.5/325 mg one po q6 hours prn migraine # 30/ no refills

## 2013-12-09 ENCOUNTER — Ambulatory Visit (INDEPENDENT_AMBULATORY_CARE_PROVIDER_SITE_OTHER): Payer: 59

## 2013-12-09 DIAGNOSIS — R51 Headache: Secondary | ICD-10-CM

## 2013-12-09 DIAGNOSIS — M542 Cervicalgia: Secondary | ICD-10-CM

## 2013-12-09 DIAGNOSIS — M5030 Other cervical disc degeneration, unspecified cervical region: Secondary | ICD-10-CM

## 2013-12-09 DIAGNOSIS — G43909 Migraine, unspecified, not intractable, without status migrainosus: Secondary | ICD-10-CM

## 2013-12-10 ENCOUNTER — Other Ambulatory Visit: Payer: Self-pay | Admitting: Nurse Practitioner

## 2013-12-22 ENCOUNTER — Other Ambulatory Visit: Payer: Self-pay | Admitting: *Deleted

## 2013-12-22 DIAGNOSIS — G43909 Migraine, unspecified, not intractable, without status migrainosus: Secondary | ICD-10-CM

## 2013-12-22 MED ORDER — BACLOFEN 10 MG PO TABS
10.0000 mg | ORAL_TABLET | Freq: Three times a day (TID) | ORAL | Status: DC | PRN
Start: 2013-12-22 — End: 2014-07-14

## 2013-12-22 NOTE — Telephone Encounter (Signed)
Pt called with request for Baclofen.  RX sent to [facility].

## 2014-01-13 ENCOUNTER — Encounter: Payer: Self-pay | Admitting: Nurse Practitioner

## 2014-01-13 ENCOUNTER — Ambulatory Visit (INDEPENDENT_AMBULATORY_CARE_PROVIDER_SITE_OTHER): Payer: 59 | Admitting: Nurse Practitioner

## 2014-01-13 VITALS — BP 135/84 | HR 55 | Resp 16 | Ht 65.0 in | Wt 173.0 lb

## 2014-01-13 DIAGNOSIS — M62838 Other muscle spasm: Secondary | ICD-10-CM

## 2014-01-13 DIAGNOSIS — G43909 Migraine, unspecified, not intractable, without status migrainosus: Secondary | ICD-10-CM

## 2014-01-13 DIAGNOSIS — M542 Cervicalgia: Secondary | ICD-10-CM

## 2014-01-13 DIAGNOSIS — G4700 Insomnia, unspecified: Secondary | ICD-10-CM

## 2014-01-13 MED ORDER — ALPRAZOLAM 0.25 MG PO TABS: 0.2500 mg | ORAL_TABLET | Freq: Every evening | ORAL | Status: DC | PRN

## 2014-01-13 MED ORDER — ONDANSETRON HCL 8 MG PO TABS: 8.0000 mg | ORAL_TABLET | Freq: Three times a day (TID) | ORAL | Status: DC | PRN

## 2014-01-13 MED ORDER — TOPIRAMATE 50 MG PO TABS: 50.0000 mg | ORAL_TABLET | Freq: Three times a day (TID) | ORAL | Status: DC

## 2014-01-13 MED ORDER — CELECOXIB 200 MG PO CAPS: 200.0000 mg | ORAL_CAPSULE | Freq: Two times a day (BID) | ORAL | Status: DC

## 2014-01-13 NOTE — Progress Notes (Signed)
S: Pt in office today for Botox injections. Her migraine prevention consists of Botox at 100 units, Topamax 100 mg, Metoprolol at 25 mg. She is on Zoloft for anxiety and depression and takes xanax for sleep at night. She feels her migraines are the same, no real improvement. Her MRI of her neck returned and it seems that may be the real problem     O: Bilateral Traps are very tight  Botox Procedure Note/ Pt brought her own Botox/ 100 units Lot # [id number] Expiration Date Aug 2017  Botox Dosing by [employer] for Chronic Migraine   Injection Sites for Migraines  Botox 100 units was injected using the dosage in the table above in the pattern shown above.  A: Migraine  Muscle spasm   P: Botox 100 units injected today with more focus on shoulders Add celebrex 200 mg po q day Consider Physical therapy Refill Zofran, Xanax Increase Topamax to 150 mg daily RTC 3 Months.

## 2014-01-13 NOTE — Patient Instructions (Signed)
Abdominal Migraine Abdominal migraine is one of several types of migraine. It is one example of "periodic syndrome". The periodic type more commonly occurs in children. Such children usually have a family history of migraine. Children may go on to develop typical migraines later in their lives.  SYMPTOMS  The attacks usually include intermittent periods of abdominal pain. Along with the abdominal pain, other symptoms may occur such as:  Nausea.  Vomiting.  Intense blushing or reddening of the skin (flushing).  Pale appearance to the skin (pallor). DIAGNOSIS  Tests may be done to look for other possible conditions. TREATMENT  Medications that are useful in treating migraine may also work to control these attacks in most children. Document Released: 02/17/2004 Document Revised: 03/24/2013 Document Reviewed: 07/15/2008 ExitCare Patient Information 2014 [employer].  

## 2014-02-05 ENCOUNTER — Ambulatory Visit: Payer: 59 | Admitting: Physical Therapy

## 2014-02-12 ENCOUNTER — Ambulatory Visit: Payer: 59 | Admitting: Physical Therapy

## 2014-03-02 ENCOUNTER — Ambulatory Visit (INDEPENDENT_AMBULATORY_CARE_PROVIDER_SITE_OTHER): Payer: 59 | Admitting: Physical Therapy

## 2014-03-02 DIAGNOSIS — M2560 Stiffness of unspecified joint, not elsewhere classified: Secondary | ICD-10-CM

## 2014-03-02 DIAGNOSIS — M530 Cervicocranial syndrome: Secondary | ICD-10-CM

## 2014-03-02 DIAGNOSIS — M6281 Muscle weakness (generalized): Secondary | ICD-10-CM

## 2014-03-02 DIAGNOSIS — M542 Cervicalgia: Secondary | ICD-10-CM

## 2014-03-02 DIAGNOSIS — M62838 Other muscle spasm: Secondary | ICD-10-CM

## 2014-03-03 ENCOUNTER — Ambulatory Visit: Payer: 59 | Admitting: Physical Therapy

## 2014-03-06 ENCOUNTER — Encounter (INDEPENDENT_AMBULATORY_CARE_PROVIDER_SITE_OTHER): Payer: 59 | Admitting: Physical Therapy

## 2014-03-06 DIAGNOSIS — M62838 Other muscle spasm: Secondary | ICD-10-CM

## 2014-03-06 DIAGNOSIS — M542 Cervicalgia: Secondary | ICD-10-CM

## 2014-03-06 DIAGNOSIS — E079 Disorder of thyroid, unspecified: Secondary | ICD-10-CM

## 2014-03-06 DIAGNOSIS — M2560 Stiffness of unspecified joint, not elsewhere classified: Secondary | ICD-10-CM

## 2014-03-06 DIAGNOSIS — M6281 Muscle weakness (generalized): Secondary | ICD-10-CM

## 2014-03-17 ENCOUNTER — Telehealth: Payer: Self-pay | Admitting: *Deleted

## 2014-03-17 DIAGNOSIS — M542 Cervicalgia: Secondary | ICD-10-CM

## 2014-03-17 DIAGNOSIS — G43909 Migraine, unspecified, not intractable, without status migrainosus: Secondary | ICD-10-CM

## 2014-03-17 MED ORDER — HYDROCODONE-ACETAMINOPHEN 7.5-325 MG PO TABS: 1.0000 | ORAL_TABLET | Freq: Four times a day (QID) | ORAL | Status: DC | PRN

## 2014-03-17 NOTE — Telephone Encounter (Signed)
Refill- Hydrocodone

## 2014-03-18 ENCOUNTER — Other Ambulatory Visit: Payer: Self-pay | Admitting: *Deleted

## 2014-03-18 ENCOUNTER — Encounter (INDEPENDENT_AMBULATORY_CARE_PROVIDER_SITE_OTHER): Payer: 59 | Admitting: Physical Therapy

## 2014-03-18 DIAGNOSIS — M6281 Muscle weakness (generalized): Secondary | ICD-10-CM

## 2014-03-18 DIAGNOSIS — M2560 Stiffness of unspecified joint, not elsewhere classified: Secondary | ICD-10-CM

## 2014-03-18 DIAGNOSIS — M62838 Other muscle spasm: Secondary | ICD-10-CM

## 2014-03-18 DIAGNOSIS — R51 Headache: Principal | ICD-10-CM

## 2014-03-18 DIAGNOSIS — M542 Cervicalgia: Secondary | ICD-10-CM

## 2014-03-18 DIAGNOSIS — R519 Headache, unspecified: Secondary | ICD-10-CM

## 2014-03-18 MED ORDER — CELECOXIB 200 MG PO CAPS: 200.0000 mg | ORAL_CAPSULE | Freq: Two times a day (BID) | ORAL | Status: DC

## 2014-03-26 ENCOUNTER — Encounter (INDEPENDENT_AMBULATORY_CARE_PROVIDER_SITE_OTHER): Payer: 59 | Admitting: Physical Therapy

## 2014-03-26 DIAGNOSIS — M542 Cervicalgia: Secondary | ICD-10-CM

## 2014-03-26 DIAGNOSIS — M2560 Stiffness of unspecified joint, not elsewhere classified: Secondary | ICD-10-CM

## 2014-03-26 DIAGNOSIS — G43909 Migraine, unspecified, not intractable, without status migrainosus: Secondary | ICD-10-CM

## 2014-03-26 DIAGNOSIS — M6281 Muscle weakness (generalized): Secondary | ICD-10-CM

## 2014-03-26 DIAGNOSIS — M62838 Other muscle spasm: Secondary | ICD-10-CM

## 2014-04-14 ENCOUNTER — Ambulatory Visit (INDEPENDENT_AMBULATORY_CARE_PROVIDER_SITE_OTHER): Payer: 59 | Admitting: Nurse Practitioner

## 2014-04-14 ENCOUNTER — Encounter: Payer: Self-pay | Admitting: Nurse Practitioner

## 2014-04-14 VITALS — BP 136/78 | HR 63 | Resp 16 | Ht 65.0 in | Wt 175.0 lb

## 2014-04-14 DIAGNOSIS — G4700 Insomnia, unspecified: Secondary | ICD-10-CM

## 2014-04-14 DIAGNOSIS — M542 Cervicalgia: Secondary | ICD-10-CM

## 2014-04-14 DIAGNOSIS — G43909 Migraine, unspecified, not intractable, without status migrainosus: Secondary | ICD-10-CM

## 2014-04-14 DIAGNOSIS — M62838 Other muscle spasm: Secondary | ICD-10-CM

## 2014-04-14 MED ORDER — TIZANIDINE HCL 4 MG PO TABS: 4.0000 mg | ORAL_TABLET | Freq: Three times a day (TID) | ORAL | Status: DC

## 2014-04-14 NOTE — Progress Notes (Signed)
S: Pt in office today for Botox injections. Her Botox has been working very well for migraine prevention and she feels that she is at least 30 % improved. She is using 100 units as needed. She has been going to physical therapy and using Celebrex which have also helped. She is not helped by Robaxin and would like to go back to zanaflex   O: well developed, well nourished caucasian female in NAD Muscles in neck and shoulders are tight / right worse than left  Botox Procedure Note Lot # [id number] Expiration Date Sept 2017   Botox Dosing by [employer] for Chronic Migraine   Injection Sites for Migraines  Botox 100 units was injected using the dosage in the table above in the pattern shown above.  A: Migraine  Muscle spasm   P: Botox 100 units injected today.  Zanaflex 4 mg po q 8 hrs prn May consider going up to 200 units for better muscle spasm relief  RTC 3 Months.

## 2014-04-14 NOTE — Patient Instructions (Signed)
°  Muscle Cramps and Spasms °Muscle cramps and spasms occur when a muscle or muscles tighten and you have no control over this tightening (involuntary muscle contraction). They are a common problem and can develop in any muscle. The most common place is in the calf muscles of the leg. Both muscle cramps and muscle spasms are involuntary muscle contractions, but they also have differences:  °· Muscle cramps are sporadic and painful. They may last a few seconds to a quarter of an hour. Muscle cramps are often more forceful and last longer than muscle spasms. °· Muscle spasms may or may not be painful. They may also last just a few seconds or much longer. °CAUSES  °It is uncommon for cramps or spasms to be due to a serious underlying problem. In many cases, the cause of cramps or spasms is unknown. Some common causes are:  °· Overexertion.   °· Overuse from repetitive motions (doing the same thing over and over).   °· Remaining in a certain position for a long period of time.   °· Improper preparation, form, or technique while performing a sport or activity.   °· Dehydration.   °· Injury.   °· Side effects of some medicines.   °· Abnormally low levels of the salts and ions in your blood (electrolytes), especially potassium and calcium. This could happen if you are taking water pills (diuretics) or you are pregnant.   °Some underlying medical problems can make it more likely to develop cramps or spasms. These include, but are not limited to:  °· Diabetes.   °· Parkinson disease.   °· Hormone disorders, such as thyroid problems.   °· Alcohol abuse.   °· Diseases specific to muscles, joints, and bones.   °· Blood vessel disease where not enough blood is getting to the muscles.   °HOME CARE INSTRUCTIONS  °· Stay well hydrated. Drink enough water and fluids to keep your urine clear or pale yellow. °· It may be helpful to massage, stretch, and relax the affected muscle. °· For tight or tense muscles, use a warm towel, heating  pad, or hot shower water directed to the affected area. °· If you are sore or have pain after a cramp or spasm, applying ice to the affected area may relieve discomfort. °· Put ice in a plastic bag. °· Place a towel between your skin and the bag. °· Leave the ice on for 15-20 minutes, 03-04 times a day. °· Medicines used to treat a known cause of cramps or spasms may help reduce their frequency or severity. Only take over-the-counter or prescription medicines as directed by your caregiver. °SEEK MEDICAL CARE IF:  °Your cramps or spasms get more severe, more frequent, or do not improve over time.  °MAKE SURE YOU:  °· Understand these instructions. °· Will watch your condition. °· Will get help right away if you are not doing well or get worse. °Document Released: 05/19/2002 Document Revised: 03/24/2013 Document Reviewed: 11/13/2012 °[employer]® Patient Information ©2014 [employer]. ° ° °

## 2014-05-14 MED ORDER — MONTELUKAST SODIUM 10 MG PO TABS
10.0000 mg | ORAL_TABLET | Freq: Every day | ORAL | Status: DC
Start: 2014-05-14 — End: 2015-04-16

## 2014-06-18 ENCOUNTER — Other Ambulatory Visit: Payer: Self-pay | Admitting: Nurse Practitioner

## 2014-06-22 ENCOUNTER — Other Ambulatory Visit: Payer: Self-pay | Admitting: *Deleted

## 2014-06-22 DIAGNOSIS — F419 Anxiety disorder, unspecified: Secondary | ICD-10-CM

## 2014-06-22 MED ORDER — SERTRALINE HCL 100 MG PO TABS: 100.0000 mg | ORAL_TABLET | Freq: Every day | ORAL | Status: DC

## 2014-07-10 ENCOUNTER — Other Ambulatory Visit: Payer: Self-pay | Admitting: Nurse Practitioner

## 2014-07-14 ENCOUNTER — Encounter: Payer: 59 | Admitting: Nurse Practitioner

## 2014-07-14 ENCOUNTER — Ambulatory Visit (INDEPENDENT_AMBULATORY_CARE_PROVIDER_SITE_OTHER): Payer: 59 | Admitting: Nurse Practitioner

## 2014-07-14 ENCOUNTER — Encounter: Payer: Self-pay | Admitting: Nurse Practitioner

## 2014-07-14 VITALS — BP 99/63 | HR 64 | Resp 16 | Ht 65.0 in | Wt 169.0 lb

## 2014-07-14 DIAGNOSIS — F411 Generalized anxiety disorder: Secondary | ICD-10-CM

## 2014-07-14 DIAGNOSIS — F419 Anxiety disorder, unspecified: Secondary | ICD-10-CM

## 2014-07-14 DIAGNOSIS — M62838 Other muscle spasm: Secondary | ICD-10-CM

## 2014-07-14 DIAGNOSIS — F418 Other specified anxiety disorders: Secondary | ICD-10-CM | POA: Insufficient documentation

## 2014-07-14 DIAGNOSIS — M542 Cervicalgia: Secondary | ICD-10-CM

## 2014-07-14 DIAGNOSIS — G43009 Migraine without aura, not intractable, without status migrainosus: Secondary | ICD-10-CM

## 2014-07-14 MED ORDER — ALPRAZOLAM 0.25 MG PO TABS: 0.2500 mg | ORAL_TABLET | Freq: Every evening | ORAL | Status: DC | PRN

## 2014-07-14 MED ORDER — SUMATRIPTAN SUCCINATE 100 MG PO TABS: 100.0000 mg | ORAL_TABLET | Freq: Once | ORAL | Status: DC | PRN

## 2014-07-14 MED ORDER — HYDROCODONE-ACETAMINOPHEN 7.5-325 MG PO TABS: 1.0000 | ORAL_TABLET | Freq: Four times a day (QID) | ORAL | Status: DC | PRN

## 2014-07-14 MED ORDER — TOPIRAMATE 50 MG PO TABS: 50.0000 mg | ORAL_TABLET | Freq: Three times a day (TID) | ORAL | Status: DC

## 2014-07-14 MED ORDER — BACLOFEN 10 MG PO TABS: 10.0000 mg | ORAL_TABLET | Freq: Three times a day (TID) | ORAL | Status: DC | PRN

## 2014-07-14 MED ORDER — METOPROLOL SUCCINATE ER 50 MG PO TB24: 50.0000 mg | ORAL_TABLET | Freq: Every day | ORAL | Status: DC

## 2014-07-14 MED ORDER — TIZANIDINE HCL 4 MG PO TABS: 4.0000 mg | ORAL_TABLET | Freq: Three times a day (TID) | ORAL | Status: DC

## 2014-07-14 MED ORDER — SERTRALINE HCL 100 MG PO TABS: 100.0000 mg | ORAL_TABLET | Freq: Every day | ORAL | Status: DC

## 2014-07-14 MED ORDER — ONDANSETRON HCL 8 MG PO TABS: 8.0000 mg | ORAL_TABLET | Freq: Three times a day (TID) | ORAL | Status: DC | PRN

## 2014-07-14 NOTE — Patient Instructions (Signed)

## 2014-07-14 NOTE — Progress Notes (Signed)
S: Pt in office today for Botox injections. Her Botox has been working very well for migraine prevention. She is using 100 units as needed. She needs refills on her medications.    O: Alert, oriented, NAD Neuro: negative M/S Muscles in neck and shoulders are softer Temples are tight  Botox Procedure Note Lot # [id number] Expiration Date Feb 2018  Botox Dosing by [facility] for Chronic Migraine   Injection Sites for Migraines  Botox 100 units was injected using the dosage in the table above in the pattern shown above.  A: Migraine  Muscle spasm   P: Botox 100 units injected today.  Refill Medications RTC 3 Months.

## 2014-10-12 ENCOUNTER — Encounter: Payer: Self-pay | Admitting: Nurse Practitioner

## 2014-10-13 ENCOUNTER — Other Ambulatory Visit: Payer: Self-pay | Admitting: *Deleted

## 2014-10-13 DIAGNOSIS — M542 Cervicalgia: Secondary | ICD-10-CM

## 2014-10-13 DIAGNOSIS — G43009 Migraine without aura, not intractable, without status migrainosus: Secondary | ICD-10-CM

## 2014-10-13 MED ORDER — HYDROCODONE-ACETAMINOPHEN 7.5-325 MG PO TABS: 1.0000 | ORAL_TABLET | Freq: Four times a day (QID) | ORAL | Status: DC | PRN

## 2014-10-13 NOTE — Telephone Encounter (Signed)
RF authorization for Vicodin given to pt per Jannifer RodneyLinda Barefoot, NP

## 2014-10-21 ENCOUNTER — Encounter: Payer: Self-pay | Admitting: Medical

## 2014-10-21 ENCOUNTER — Ambulatory Visit (INDEPENDENT_AMBULATORY_CARE_PROVIDER_SITE_OTHER): Payer: 59 | Admitting: Medical

## 2014-10-21 VITALS — BP 123/89 | HR 57 | Temp 97.7°F | Ht 65.0 in | Wt 171.0 lb

## 2014-10-21 DIAGNOSIS — G4700 Insomnia, unspecified: Secondary | ICD-10-CM

## 2014-10-21 DIAGNOSIS — R0981 Nasal congestion: Secondary | ICD-10-CM

## 2014-10-21 DIAGNOSIS — J0100 Acute maxillary sinusitis, unspecified: Secondary | ICD-10-CM

## 2014-10-21 DIAGNOSIS — J019 Acute sinusitis, unspecified: Secondary | ICD-10-CM | POA: Insufficient documentation

## 2014-10-21 MED ORDER — FLUTICASONE PROPIONATE 50 MCG/ACT NA SUSP: 2.0000 | Freq: Every day | NASAL | Status: DC

## 2014-10-21 MED ORDER — TRAZODONE HCL 50 MG PO TABS: 25.0000 mg | ORAL_TABLET | Freq: Every evening | ORAL | Status: DC | PRN

## 2014-10-21 MED ORDER — METHYLPREDNISOLONE ACETATE 80 MG/ML IJ SUSP
40.0000 mg | Freq: Once | INTRAMUSCULAR | Status: AC
  Administered 2014-10-21: 40 mg via INTRAMUSCULAR

## 2014-10-21 MED ORDER — BENZONATATE 100 MG PO CAPS
100.0000 mg | ORAL_CAPSULE | Freq: Three times a day (TID) | ORAL | Status: DC | PRN
Start: 2014-10-21 — End: 2014-12-15

## 2014-10-21 MED ORDER — CEFDINIR 300 MG PO CAPS: 300.0000 mg | ORAL_CAPSULE | Freq: Two times a day (BID) | ORAL | Status: DC

## 2014-10-21 NOTE — Progress Notes (Signed)
Subjective:    Patient ID: [name], female    DOB: 1961-05-29, 53 y.o.   MRN: [id number]  HPI   Pt in states she needs refill of her trazadone.. She used in past to help her sleep. Pt states off for a while but wants refill again to help her sleep. Not reporting any depression today.   Pt states some cough, congestion, runny nose for 2 1/2 weeks. Monday fever of 100. Pt tried netty pot, sudafed, and afrin. No improvement with treatments except does mention afrin only thing that has helped. But has used afrin intermittently over past 14 days. Pt states now maxillary and frontal sinus pressure. Fever again last night.  Past Medical History  Diagnosis Date  . Migraines   . Infertility, female     History   Social History  . Marital Status: Married    Spouse Name: N/A    Number of Children: N/A  . Years of Education: N/A   Occupational History  . Not on file.   Social History Main Topics  . Smoking status: Former Smoker    Types: Cigarettes    Quit date: 02/09/1992  . Smokeless tobacco: Never Used  . Alcohol Use: Yes     Comment: 1-2 weekly  . Drug Use: No  . Sexual Activity:    Partners: Male   Other Topics Concern  . Not on file   Social History Narrative    Past Surgical History  Procedure Laterality Date  . Shoulder surgery      Left  . Ankle surgery      Left    Family History  Problem Relation Age of Onset  . Hypertension Mother   . Cancer Maternal Grandmother   . Stroke      No Known Allergies  Current Outpatient Prescriptions on File Prior to Visit  Medication Sig Dispense Refill  . ALPRAZolam (XANAX) 0.25 MG tablet Take 1 tablet (0.25 mg total) by mouth at bedtime as needed for sleep. 30 tablet 3  . baclofen (LIORESAL) 10 MG tablet Take 1 tablet (10 mg total) by mouth every 8 (eight) hours as needed. 60 each 3  . BOTOX 100 UNITS SOLR injection INJECT 100 UNITS INTO THE MUSCLE ONCE. 1 vial 3  . celecoxib (CELEBREX) 200 MG capsule Take 1  capsule (200 mg total) by mouth 2 (two) times daily. 60 capsule 6  . HYDROcodone-acetaminophen (NORCO) 7.5-325 MG per tablet Take 1 tablet by mouth every 6 (six) hours as needed. 30 tablet 0  . metoprolol succinate (TOPROL-XL) 50 MG 24 hr tablet Take 1 tablet (50 mg total) by mouth daily. 30 tablet 11  . montelukast (SINGULAIR) 10 MG tablet Take 1 tablet (10 mg total) by mouth at bedtime. 30 tablet 6  . naratriptan (AMERGE) 2.5 MG tablet Take 1 tablet (2.5 mg total) by mouth as needed. Take one (1) tablet at onset of headache; if returns or does not resolve, may repeat after 4 hours; do not exceed five (5) mg in 24 hours. 10 tablet 11  . ondansetron (ZOFRAN) 8 MG tablet Take 1 tablet (8 mg total) by mouth every 8 (eight) hours as needed. 20 tablet 3  . sertraline (ZOLOFT) 100 MG tablet Take 1 tablet (100 mg total) by mouth daily. 30 tablet 11  . SUMAtriptan (IMITREX) 100 MG tablet Take 1 tablet (100 mg total) by mouth once as needed for migraine. 9 tablet 11  . SUMAtriptan (IMITREX) 100 MG tablet Take 1 tablet (100  mg total) by mouth once as needed for migraine. 9 tablet 11  . SUMAtriptan (IMITREX) 6 MG/0.5ML SOLN injection Inject 0.33 mLs (3.96 mg total) into the skin as needed. F 12 vial 1  . tiZANidine (ZANAFLEX) 4 MG tablet Take 1 tablet (4 mg total) by mouth 3 (three) times daily. 90 tablet 2  . topiramate (TOPAMAX) 50 MG tablet Take 1 tablet (50 mg total) by mouth 3 (three) times daily. 90 tablet 11   No current facility-administered medications on file prior to visit.    BP 123/89 mmHg  Pulse 57  Temp(Src) 97.7 F (36.5 C) (Oral)  Ht 5\' 5"  (1.651 m)  Wt 171 lb (77.565 kg)  BMI 28.46 kg/m2  SpO2 97%        Review of Systems  Constitutional: Positive for fever. Negative for chills and fatigue.  HENT: Positive for congestion and sinus pressure. Negative for drooling, ear discharge, ear pain, nosebleeds, postnasal drip, sore throat and trouble swallowing.   Respiratory: Positive  for cough. Negative for chest tightness and wheezing.   Cardiovascular: Negative for chest pain and palpitations.  Gastrointestinal: Negative.   Genitourinary: Negative.   Musculoskeletal: Negative for back pain.  Neurological: Negative.   Hematological: Negative for adenopathy. Does not bruise/bleed easily.  Psychiatric/Behavioral: Positive for sleep disturbance. Negative for hallucinations, behavioral problems, confusion and dysphoric mood. The patient is not nervous/anxious and is not hyperactive.        Objective:   Physical Exam  General  Mental Status - Alert. General Appearance - Well groomed. Not in acute distress.  Skin Rashes- No Rashes.  HEENT Head- Normal. Ear Auditory Canal - Left- Normal. Right - Normal.Tympanic Membrane- Left- Normal. Right- Normal. Eye Sclera/Conjunctiva- Left- Normal. Right- Normal. Nose & Sinuses Nasal Mucosa- Left- Not Boggy or Congested. Right- Not Boggy or Congested. Mouth & Throat Lips: Upper Lip- Normal: no dryness, cracking, pallor, cyanosis, or vesicular eruption. Lower Lip-Normal: no dryness, cracking, pallor, cyanosis or vesicular eruption. Buccal Mucosa- Bilateral- No Aphthous ulcers. Oropharynx- No Discharge or Erythema. Tonsils: Characteristics- Bilateral- No Erythema or Congestion. Size/Enlargement- Bilateral- No enlargement. Discharge- bilateral-None.  Neck Neck- Supple. No Masses.   Chest and Lung Exam Auscultation: Breath Sounds:-Normal.  Cardiovascular Auscultation:Rythm- Regular.  Murmurs & Other Heart Sounds:Ausculatation of the heart reveal- No Murmurs.  Lymphatic Head & Neck General Head & Neck Lymphatics: Bilateral: Description- No Localized lymphadenopathy.        Assessment & Plan:

## 2014-10-21 NOTE — Progress Notes (Signed)
Pre visit review using our clinic review tool, if applicable. No additional management support is needed unless otherwise documented below in the visit note. 

## 2014-10-21 NOTE — Patient Instructions (Signed)
You appear to have sinusitis now after 3 week struggle with nasal congestion which may have stemmed from uri vs allergies. Now you may have some rebound nasal congestion from afrin use as well  I am prescribing cefdinir antibiotic, fluticasone nasal spray and benzonatate for cough. Please stop afrin. We gave you depo medrol im injection in the office.  Also for your insomnia, I refilled trazodone.  Follow up in 7 days or as needed.

## 2014-10-21 NOTE — Assessment & Plan Note (Signed)
Sinusitis now after 3 week struggle with nasal congestion which may have stemmed from uri vs allergies.Pt may have some rebound nasal congestion from afrin use as well  I am prescribing cefdinir antibiotic, fluticasone nasal spray and benzonatate for cough. Advised to stop afrin. We gave you depo medrol im injection in the office.

## 2014-10-21 NOTE — Assessment & Plan Note (Signed)
Hx of and trazodone refill.

## 2014-11-19 ENCOUNTER — Other Ambulatory Visit: Payer: Self-pay | Admitting: *Deleted

## 2014-11-19 DIAGNOSIS — G43011 Migraine without aura, intractable, with status migrainosus: Secondary | ICD-10-CM

## 2014-11-19 MED ORDER — NARATRIPTAN HCL 2.5 MG PO TABS: 2.5000 mg | ORAL_TABLET | ORAL | Status: DC | PRN

## 2014-11-19 NOTE — Telephone Encounter (Signed)
RF [facility].

## 2014-12-15 ENCOUNTER — Ambulatory Visit (INDEPENDENT_AMBULATORY_CARE_PROVIDER_SITE_OTHER): Payer: 59 | Admitting: Nurse Practitioner

## 2014-12-15 ENCOUNTER — Encounter: Payer: Self-pay | Admitting: Nurse Practitioner

## 2014-12-15 VITALS — BP 142/96 | HR 67 | Resp 16 | Ht 65.0 in | Wt 168.0 lb

## 2014-12-15 DIAGNOSIS — M62838 Other muscle spasm: Secondary | ICD-10-CM

## 2014-12-15 DIAGNOSIS — G4489 Other headache syndrome: Secondary | ICD-10-CM

## 2014-12-15 DIAGNOSIS — G43009 Migraine without aura, not intractable, without status migrainosus: Secondary | ICD-10-CM

## 2014-12-15 DIAGNOSIS — F419 Anxiety disorder, unspecified: Secondary | ICD-10-CM

## 2014-12-15 DIAGNOSIS — M542 Cervicalgia: Secondary | ICD-10-CM

## 2014-12-15 MED ORDER — HYDROCODONE-ACETAMINOPHEN 7.5-325 MG PO TABS: 1.0000 | ORAL_TABLET | Freq: Four times a day (QID) | ORAL | Status: DC | PRN

## 2014-12-15 MED ORDER — CELECOXIB 200 MG PO CAPS: 200.0000 mg | ORAL_CAPSULE | Freq: Two times a day (BID) | ORAL | Status: DC

## 2014-12-15 MED ORDER — ALPRAZOLAM 0.25 MG PO TABS: 0.2500 mg | ORAL_TABLET | Freq: Every evening | ORAL | Status: DC | PRN

## 2014-12-15 MED ORDER — SERTRALINE HCL 100 MG PO TABS: 100.0000 mg | ORAL_TABLET | Freq: Every day | ORAL | Status: DC

## 2014-12-15 MED ORDER — ONDANSETRON HCL 8 MG PO TABS: 8.0000 mg | ORAL_TABLET | Freq: Three times a day (TID) | ORAL | Status: DC | PRN

## 2014-12-15 MED ORDER — TIZANIDINE HCL 4 MG PO TABS: 4.0000 mg | ORAL_TABLET | Freq: Three times a day (TID) | ORAL | Status: DC

## 2014-12-15 NOTE — Patient Instructions (Signed)

## 2014-12-15 NOTE — Progress Notes (Signed)
S: Pt in office today for Botox injections. Her Botox has been working very well for migraine prevention. She is using 100 units as needed. She misses her last appointment and realized she was really getting a benefit from Botox. Her husband had GI bleed and was diagnosed with colon cancer over the holidays and that was all very stressful but resolved with surgery. She needs her medications refilled   O: Alert, oriented, NAD Temples and traps are tender  Botox Procedure Note/ Pt brought her own Botox Lot # [id number]  Expiration Date May 2018  Botox Dosing by [employer] for Chronic Migraine   Injection Sites for Migraines  Botox 100 units was injected using the dosage in the table above in the pattern shown above.  A: Migraine  Muscle spasm   P: Botox 100 units injected today.  Refill medications RTC 3 Months.

## 2014-12-31 ENCOUNTER — Telehealth: Payer: Self-pay | Admitting: Family Medicine

## 2014-12-31 ENCOUNTER — Encounter: Payer: 59 | Admitting: Family Medicine

## 2014-12-31 MED ORDER — NYSTATIN 100000 UNIT/ML MT SUSP: 500000.0000 [IU] | Freq: Four times a day (QID) | OROMUCOSAL | Status: DC

## 2014-12-31 NOTE — ED Notes (Signed)
Patient has oral thrush.  Rx nystatin suspension swish and swallow. Return as needed.  Rodolph BongEvan S Corey, MD 12/31/14 [phone]

## 2015-01-21 ENCOUNTER — Encounter: Payer: 59 | Admitting: Family Medicine

## 2015-01-22 ENCOUNTER — Ambulatory Visit (INDEPENDENT_AMBULATORY_CARE_PROVIDER_SITE_OTHER): Payer: 59 | Admitting: Family Medicine

## 2015-01-22 ENCOUNTER — Encounter: Payer: Self-pay | Admitting: Family Medicine

## 2015-01-22 VITALS — BP 136/86 | HR 64 | Temp 98.1°F | Ht 64.0 in | Wt 173.4 lb

## 2015-01-22 DIAGNOSIS — N760 Acute vaginitis: Secondary | ICD-10-CM

## 2015-01-22 DIAGNOSIS — E663 Overweight: Secondary | ICD-10-CM

## 2015-01-22 DIAGNOSIS — Z0000 Encounter for general adult medical examination without abnormal findings: Secondary | ICD-10-CM

## 2015-01-22 MED ORDER — FLUCONAZOLE 150 MG PO TABS: 150.0000 mg | ORAL_TABLET | Freq: Once | ORAL | Status: DC

## 2015-01-22 MED ORDER — NALTREXONE-BUPROPION HCL ER 8-90 MG PO TB12: ORAL_TABLET | ORAL | Status: DC

## 2015-01-22 NOTE — Progress Notes (Signed)
Subjective:     [name] is a 54 y.o. female and is here for a comprehensive physical exam. The patient reports problems - seeing urology for recurrent UTIs.  History   Social History  . Marital Status: Married    Spouse Name: N/A  . Number of Children: N/A  . Years of Education: N/A   Occupational History  . Not on file.   Social History Main Topics  . Smoking status: Former Smoker    Types: Cigarettes    Quit date: 02/09/1992  . Smokeless tobacco: Never Used  . Alcohol Use: Yes     Comment: 1-2 weekly  . Drug Use: No  . Sexual Activity:    Partners: Male   Other Topics Concern  . Not on file   Social History Narrative   Health Maintenance  Topic Date Due  . Janet BerlinETANUS/TDAP  05/29/1978  . PAP SMEAR  12/12/2011  . MAMMOGRAM  04/30/2015  . INFLUENZA VACCINE  07/12/2015  . COLONOSCOPY  12/11/2018    The following portions of the patient's history were reviewed and updated as appropriate:  She  has a past medical history of Migraines and Infertility, female. She  does not have any pertinent problems on file. She  has past surgical history that includes Shoulder surgery and Ankle surgery. Her family history includes Cancer in her maternal grandmother; Hypertension in her mother; Stroke in an other family member. She  reports that she quit smoking about 22 years ago. Her smoking use included Cigarettes. She has never used smokeless tobacco. She reports that she drinks alcohol. She reports that she does not use illicit drugs. She has a current medication list which includes the following prescription(s): alprazolam, baclofen, botox, celecoxib, cephalexin, cetirizine, fluticasone, hydrocodone-acetaminophen, metoprolol succinate, montelukast, naratriptan, ondansetron, sertraline, sumatriptan, tizanidine, topiramate, and trazodone. Current Outpatient Prescriptions on File Prior to Visit  Medication Sig Dispense Refill  . ALPRAZolam (XANAX) 0.25 MG tablet Take 1 tablet  (0.25 mg total) by mouth at bedtime as needed for sleep. 30 tablet 3  . baclofen (LIORESAL) 10 MG tablet Take 1 tablet (10 mg total) by mouth every 8 (eight) hours as needed. 60 each 3  . BOTOX 100 UNITS SOLR injection INJECT 100 UNITS INTO THE MUSCLE ONCE. 1 vial 3  . celecoxib (CELEBREX) 200 MG capsule Take 1 capsule (200 mg total) by mouth 2 (two) times daily. 60 capsule 6  . cetirizine (ZYRTEC) 10 MG tablet Take 10 mg by mouth daily.    . fluticasone (FLONASE) 50 MCG/ACT nasal spray Place 2 sprays into both nostrils daily. 16 g 1  . HYDROcodone-acetaminophen (NORCO) 7.5-325 MG per tablet Take 1 tablet by mouth every 6 (six) hours as needed. 30 tablet 0  . metoprolol succinate (TOPROL-XL) 50 MG 24 hr tablet Take 1 tablet (50 mg total) by mouth daily. 30 tablet 11  . montelukast (SINGULAIR) 10 MG tablet Take 1 tablet (10 mg total) by mouth at bedtime. 30 tablet 6  . naratriptan (AMERGE) 2.5 MG tablet Take 1 tablet (2.5 mg total) by mouth as needed. Take one (1) tablet at onset of headache; if returns or does not resolve, may repeat after 4 hours; do not exceed five (5) mg in 24 hours. 10 tablet 11  . ondansetron (ZOFRAN) 8 MG tablet Take 1 tablet (8 mg total) by mouth every 8 (eight) hours as needed. 20 tablet 3  . sertraline (ZOLOFT) 100 MG tablet Take 1 tablet (100 mg total) by mouth daily. 30 tablet  11  . SUMAtriptan (IMITREX) 100 MG tablet Take 1 tablet (100 mg total) by mouth once as needed for migraine. 9 tablet 11  . tiZANidine (ZANAFLEX) 4 MG tablet Take 1 tablet (4 mg total) by mouth 3 (three) times daily. 90 tablet 2  . topiramate (TOPAMAX) 50 MG tablet Take 1 tablet (50 mg total) by mouth 3 (three) times daily. 90 tablet 11  . traZODone (DESYREL) 50 MG tablet Take 0.5-1 tablets (25-50 mg total) by mouth at bedtime as needed for sleep. 30 tablet 2   No current facility-administered medications on file prior to visit.   She has No Known Allergies..  Review of Systems Review of  Systems  Constitutional: Negative for activity change, appetite change and fatigue.  HENT: Negative for hearing loss, congestion, tinnitus and ear discharge.  dentist q27m Eyes: Negative for visual disturbance (see optho q1y -- vision corrected to 20/20 with glasses).  Respiratory: Negative for cough, chest tightness and shortness of breath.   Cardiovascular: Negative for chest pain, palpitations and leg swelling.  Gastrointestinal: Negative for abdominal pain, diarrhea, constipation and abdominal distention.  Genitourinary: Negative for urgency, frequency, decreased urine volume and difficulty urinating.  Musculoskeletal: Negative for back pain, arthralgias and gait problem.  Skin: Negative for color change, pallor and rash.  Neurological: Negative for dizziness, light-headedness, numbness and headaches.  Hematological: Negative for adenopathy. Does not bruise/bleed easily.  Psychiatric/Behavioral: Negative for suicidal ideas, confusion, sleep disturbance, self-injury, dysphoric mood, decreased concentration and agitation.       Objective:    BP 136/86 mmHg  Pulse 64  Temp(Src) 98.1 F (36.7 C) (Oral)  Ht  (1.626 m)  Wt 173 lb 6.4 oz (78.654 kg)  BMI 29.75 kg/m2  SpO2 99% General appearance: alert, cooperative, appears stated age and no distress Head: Normocephalic, without obvious abnormality, atraumatic Eyes: conjunctivae/corneas clear. PERRL, EOM's intact. Fundi benign. Ears: normal TM's and external ear canals both ears Nose: Nares normal. Septum midline. Mucosa normal. No drainage or sinus tenderness. Throat: lips, mucosa, and tongue normal; teeth and gums normal Neck: no adenopathy, no carotid bruit, no JVD, supple, symmetrical, trachea midline and thyroid not enlarged, symmetric, no tenderness/mass/nodules Back: symmetric, no curvature. ROM normal. No CVA tenderness. Lungs: clear to auscultation bilaterally Breasts: gyn Heart: S1, S2 normal Abdomen: soft,  non-tender; bowel sounds normal; no masses,  no organomegaly Pelvic: deferred--gyn Extremities: extremities normal, atraumatic, no cyanosis or edema Pulses: 2+ and symmetric Skin: suspicious mole ant chest --red scaley lesion---x 6 months --not healing Lymph nodes: Cervical, supraclavicular, and axillary nodes normal. Neurologic: Alert and oriented X 3, normal strength and tone. Normal symmetric reflexes. Normal coordination and gait Psych-- no depression, no anxious       Assessment:    Healthy female exam.       Plan:    ghm utd Check labs See After Visit Summary for Counseling Recommendations   1. Preventative health care   - Basic metabolic panel; Future - CBC with Differential/Platelet; Future - Hepatic function panel; Future - Lipid panel; Future - POCT urinalysis dipstick; Future - TSH; Future  2. Vaginitis and vulvovaginitis   - fluconazole (DIFLUCAN) 150 MG tablet; Take 1 tablet (150 mg total) by mouth once. May repeat in 3 days prn  Dispense: 2 tablet; Refill: 3  3. Overweight   - Naltrexone-Bupropion HCl ER (CONTRAVE) 8-90 MG TB12; 2 po bid  Dispense: 120 tablet; Refill: 2

## 2015-01-22 NOTE — Patient Instructions (Signed)
Preventive Care for Adults A healthy lifestyle and preventive care can promote health and wellness. Preventive health guidelines for women include the following key practices.  A routine yearly physical is a good way to check with your health care provider about your health and preventive screening. It is a chance to share any concerns and updates on your health and to receive a thorough exam.  Visit your dentist for a routine exam and preventive care every 6 months. Brush your teeth twice a day and floss once a day. Good oral hygiene prevents tooth decay and gum disease.  The frequency of eye exams is based on your age, health, family medical history, use of contact lenses, and other factors. Follow your health care provider's recommendations for frequency of eye exams.  Eat a healthy diet. Foods like vegetables, fruits, whole grains, low-fat dairy products, and lean protein foods contain the nutrients you need without too many calories. Decrease your intake of foods high in solid fats, added sugars, and salt. Eat the right amount of calories for you.Get information about a proper diet from your health care provider, if necessary.  Regular physical exercise is one of the most important things you can do for your health. Most adults should get at least 150 minutes of moderate-intensity exercise (any activity that increases your heart rate and causes you to sweat) each week. In addition, most adults need muscle-strengthening exercises on 2 or more days a week.  Maintain a healthy weight. The body mass index (BMI) is a screening tool to identify possible weight problems. It provides an estimate of body fat based on height and weight. Your health care provider can find your BMI and can help you achieve or maintain a healthy weight.For adults 20 years and older:  A BMI below 18.5 is considered underweight.  A BMI of 18.5 to 24.9 is normal.  A BMI of 25 to 29.9 is considered overweight.  A BMI of  30 and above is considered obese.  Maintain normal blood lipids and cholesterol levels by exercising and minimizing your intake of saturated fat. Eat a balanced diet with plenty of fruit and vegetables. Blood tests for lipids and cholesterol should begin at age 76 and be repeated every 5 years. If your lipid or cholesterol levels are high, you are over 50, or you are at high risk for heart disease, you may need your cholesterol levels checked more frequently.Ongoing high lipid and cholesterol levels should be treated with medicines if diet and exercise are not working.  If you smoke, find out from your health care provider how to quit. If you do not use tobacco, do not start.  Lung cancer screening is recommended for adults aged 22-80 years who are at high risk for developing lung cancer because of a history of smoking. A yearly low-dose CT scan of the lungs is recommended for people who have at least a 30-pack-year history of smoking and are a current smoker or have quit within the past 15 years. A pack year of smoking is smoking an average of 1 pack of cigarettes a day for 1 year (for example: 1 pack a day for 30 years or 2 packs a day for 15 years). Yearly screening should continue until the smoker has stopped smoking for at least 15 years. Yearly screening should be stopped for people who develop a health problem that would prevent them from having lung cancer treatment.  If you are pregnant, do not drink alcohol. If you are breastfeeding,  be very cautious about drinking alcohol. If you are not pregnant and choose to drink alcohol, do not have more than 1 drink per day. One drink is considered to be 12 ounces (355 mL) of beer, 5 ounces (148 mL) of wine, or 1.5 ounces (44 mL) of liquor.  Avoid use of street drugs. Do not share needles with anyone. Ask for help if you need support or instructions about stopping the use of drugs.  High blood pressure causes heart disease and increases the risk of  stroke. Your blood pressure should be checked at least every 1 to 2 years. Ongoing high blood pressure should be treated with medicines if weight loss and exercise do not work.  If you are 75-52 years old, ask your health care provider if you should take aspirin to prevent strokes.  Diabetes screening involves taking a blood sample to check your fasting blood sugar level. This should be done once every 3 years, after age 15, if you are within normal weight and without risk factors for diabetes. Testing should be considered at a younger age or be carried out more frequently if you are overweight and have at least 1 risk factor for diabetes.  Breast cancer screening is essential preventive care for women. You should practice "breast self-awareness." This means understanding the normal appearance and feel of your breasts and may include breast self-examination. Any changes detected, no matter how small, should be reported to a health care provider. Women in their 58s and 30s should have a clinical breast exam (CBE) by a health care provider as part of a regular health exam every 1 to 3 years. After age 16, women should have a CBE every year. Starting at age 53, women should consider having a mammogram (breast X-ray test) every year. Women who have a family history of breast cancer should talk to their health care provider about genetic screening. Women at a high risk of breast cancer should talk to their health care providers about having an MRI and a mammogram every year.  Breast cancer gene (BRCA)-related cancer risk assessment is recommended for women who have family members with BRCA-related cancers. BRCA-related cancers include breast, ovarian, tubal, and peritoneal cancers. Having family members with these cancers may be associated with an increased risk for harmful changes (mutations) in the breast cancer genes BRCA1 and BRCA2. Results of the assessment will determine the need for genetic counseling and  BRCA1 and BRCA2 testing.  Routine pelvic exams to screen for cancer are no longer recommended for nonpregnant women who are considered low risk for cancer of the pelvic organs (ovaries, uterus, and vagina) and who do not have symptoms. Ask your health care provider if a screening pelvic exam is right for you.  If you have had past treatment for cervical cancer or a condition that could lead to cancer, you need Pap tests and screening for cancer for at least 20 years after your treatment. If Pap tests have been discontinued, your risk factors (such as having a new sexual partner) need to be reassessed to determine if screening should be resumed. Some women have medical problems that increase the chance of getting cervical cancer. In these cases, your health care provider may recommend more frequent screening and Pap tests.  The HPV test is an additional test that may be used for cervical cancer screening. The HPV test looks for the virus that can cause the cell changes on the cervix. The cells collected during the Pap test can be  tested for HPV. The HPV test could be used to screen women aged 30 years and older, and should be used in women of any age who have unclear Pap test results. After the age of 30, women should have HPV testing at the same frequency as a Pap test.  Colorectal cancer can be detected and often prevented. Most routine colorectal cancer screening begins at the age of 50 years and continues through age 75 years. However, your health care provider may recommend screening at an earlier age if you have risk factors for colon cancer. On a yearly basis, your health care provider may provide home test kits to check for hidden blood in the stool. Use of a small camera at the end of a tube, to directly examine the colon (sigmoidoscopy or colonoscopy), can detect the earliest forms of colorectal cancer. Talk to your health care provider about this at age 50, when routine screening begins. Direct  exam of the colon should be repeated every 5-10 years through age 75 years, unless early forms of pre-cancerous polyps or small growths are found.  People who are at an increased risk for hepatitis B should be screened for this virus. You are considered at high risk for hepatitis B if:  You were born in a country where hepatitis B occurs often. Talk with your health care provider about which countries are considered high risk.  Your parents were born in a high-risk country and you have not received a shot to protect against hepatitis B (hepatitis B vaccine).  You have HIV or AIDS.  You use needles to inject street drugs.  You live with, or have sex with, someone who has hepatitis B.  You get hemodialysis treatment.  You take certain medicines for conditions like cancer, organ transplantation, and autoimmune conditions.  Hepatitis C blood testing is recommended for all people born from 1945 through 1965 and any individual with known risks for hepatitis C.  Practice safe sex. Use condoms and avoid high-risk sexual practices to reduce the spread of sexually transmitted infections (STIs). STIs include gonorrhea, chlamydia, syphilis, trichomonas, herpes, HPV, and human immunodeficiency virus (HIV). Herpes, HIV, and HPV are viral illnesses that have no cure. They can result in disability, cancer, and death.  You should be screened for sexually transmitted illnesses (STIs) including gonorrhea and chlamydia if:  You are sexually active and are younger than 24 years.  You are older than 24 years and your health care provider tells you that you are at risk for this type of infection.  Your sexual activity has changed since you were last screened and you are at an increased risk for chlamydia or gonorrhea. Ask your health care provider if you are at risk.  If you are at risk of being infected with HIV, it is recommended that you take a prescription medicine daily to prevent HIV infection. This is  called preexposure prophylaxis (PrEP). You are considered at risk if:  You are a heterosexual woman, are sexually active, and are at increased risk for HIV infection.  You take drugs by injection.  You are sexually active with a partner who has HIV.  Talk with your health care provider about whether you are at high risk of being infected with HIV. If you choose to begin PrEP, you should first be tested for HIV. You should then be tested every 3 months for as long as you are taking PrEP.  Osteoporosis is a disease in which the bones lose minerals and strength   with aging. This can result in serious bone fractures or breaks. The risk of osteoporosis can be identified using a bone density scan. Women ages 65 years and over and women at risk for fractures or osteoporosis should discuss screening with their health care providers. Ask your health care provider whether you should take a calcium supplement or vitamin D to reduce the rate of osteoporosis.  Menopause can be associated with physical symptoms and risks. Hormone replacement therapy is available to decrease symptoms and risks. You should talk to your health care provider about whether hormone replacement therapy is right for you.  Use sunscreen. Apply sunscreen liberally and repeatedly throughout the day. You should seek shade when your shadow is shorter than you. Protect yourself by wearing long sleeves, pants, a wide-brimmed hat, and sunglasses year round, whenever you are outdoors.  Once a month, do a whole body skin exam, using a mirror to look at the skin on your back. Tell your health care provider of new moles, moles that have irregular borders, moles that are larger than a pencil eraser, or moles that have changed in shape or color.  Stay current with required vaccines (immunizations).  Influenza vaccine. All adults should be immunized every year.  Tetanus, diphtheria, and acellular pertussis (Td, Tdap) vaccine. Pregnant women should  receive 1 dose of Tdap vaccine during each pregnancy. The dose should be obtained regardless of the length of time since the last dose. Immunization is preferred during the 27th-36th week of gestation. An adult who has not previously received Tdap or who does not know her vaccine status should receive 1 dose of Tdap. This initial dose should be followed by tetanus and diphtheria toxoids (Td) booster doses every 10 years. Adults with an unknown or incomplete history of completing a 3-dose immunization series with Td-containing vaccines should begin or complete a primary immunization series including a Tdap dose. Adults should receive a Td booster every 10 years.  Varicella vaccine. An adult without evidence of immunity to varicella should receive 2 doses or a second dose if she has previously received 1 dose. Pregnant females who do not have evidence of immunity should receive the first dose after pregnancy. This first dose should be obtained before leaving the health care facility. The second dose should be obtained 4-8 weeks after the first dose.  Human papillomavirus (HPV) vaccine. Females aged 13-26 years who have not received the vaccine previously should obtain the 3-dose series. The vaccine is not recommended for use in pregnant females. However, pregnancy testing is not needed before receiving a dose. If a female is found to be pregnant after receiving a dose, no treatment is needed. In that case, the remaining doses should be delayed until after the pregnancy. Immunization is recommended for any person with an immunocompromised condition through the age of 26 years if she did not get any or all doses earlier. During the 3-dose series, the second dose should be obtained 4-8 weeks after the first dose. The third dose should be obtained 24 weeks after the first dose and 16 weeks after the second dose.  Zoster vaccine. One dose is recommended for adults aged 60 years or older unless certain conditions are  present.  Measles, mumps, and rubella (MMR) vaccine. Adults born before 1957 generally are considered immune to measles and mumps. Adults born in 1957 or later should have 1 or more doses of MMR vaccine unless there is a contraindication to the vaccine or there is laboratory evidence of immunity to   each of the three diseases. A routine second dose of MMR vaccine should be obtained at least 28 days after the first dose for students attending postsecondary schools, health care workers, or international travelers. People who received inactivated measles vaccine or an unknown type of measles vaccine during 1963-1967 should receive 2 doses of MMR vaccine. People who received inactivated mumps vaccine or an unknown type of mumps vaccine before 1979 and are at high risk for mumps infection should consider immunization with 2 doses of MMR vaccine. For females of childbearing age, rubella immunity should be determined. If there is no evidence of immunity, females who are not pregnant should be vaccinated. If there is no evidence of immunity, females who are pregnant should delay immunization until after pregnancy. Unvaccinated health care workers born before 1957 who lack laboratory evidence of measles, mumps, or rubella immunity or laboratory confirmation of disease should consider measles and mumps immunization with 2 doses of MMR vaccine or rubella immunization with 1 dose of MMR vaccine.  Pneumococcal 13-valent conjugate (PCV13) vaccine. When indicated, a person who is uncertain of her immunization history and has no record of immunization should receive the PCV13 vaccine. An adult aged 19 years or older who has certain medical conditions and has not been previously immunized should receive 1 dose of PCV13 vaccine. This PCV13 should be followed with a dose of pneumococcal polysaccharide (PPSV23) vaccine. The PPSV23 vaccine dose should be obtained at least 8 weeks after the dose of PCV13 vaccine. An adult aged 19  years or older who has certain medical conditions and previously received 1 or more doses of PPSV23 vaccine should receive 1 dose of PCV13. The PCV13 vaccine dose should be obtained 1 or more years after the last PPSV23 vaccine dose.  Pneumococcal polysaccharide (PPSV23) vaccine. When PCV13 is also indicated, PCV13 should be obtained first. All adults aged 65 years and older should be immunized. An adult younger than age 65 years who has certain medical conditions should be immunized. Any person who resides in a nursing home or long-term care facility should be immunized. An adult smoker should be immunized. People with an immunocompromised condition and certain other conditions should receive both PCV13 and PPSV23 vaccines. People with human immunodeficiency virus (HIV) infection should be immunized as soon as possible after diagnosis. Immunization during chemotherapy or radiation therapy should be avoided. Routine use of PPSV23 vaccine is not recommended for American Indians, [place] Natives, or people younger than 65 years unless there are medical conditions that require PPSV23 vaccine. When indicated, people who have unknown immunization and have no record of immunization should receive PPSV23 vaccine. One-time revaccination 5 years after the first dose of PPSV23 is recommended for people aged 19-64 years who have chronic kidney failure, nephrotic syndrome, asplenia, or immunocompromised conditions. People who received 1-2 doses of PPSV23 before age 65 years should receive another dose of PPSV23 vaccine at age 65 years or later if at least 5 years have passed since the previous dose. Doses of PPSV23 are not needed for people immunized with PPSV23 at or after age 65 years.  Meningococcal vaccine. Adults with asplenia or persistent complement component deficiencies should receive 2 doses of quadrivalent meningococcal conjugate (MenACWY-D) vaccine. The doses should be obtained at least 2 months apart.  Microbiologists working with certain meningococcal bacteria, military recruits, people at risk during an outbreak, and people who travel to or live in countries with a high rate of meningitis should be immunized. A first-year college student up through age   21 years who is living in a residence hall should receive a dose if she did not receive a dose on or after her 16th birthday. Adults who have certain high-risk conditions should receive one or more doses of vaccine.  Hepatitis A vaccine. Adults who wish to be protected from this disease, have certain high-risk conditions, work with hepatitis A-infected animals, work in hepatitis A research labs, or travel to or work in countries with a high rate of hepatitis A should be immunized. Adults who were previously unvaccinated and who anticipate close contact with an international adoptee during the first 60 days after arrival in the [place] from a country with a high rate of hepatitis A should be immunized.  Hepatitis B vaccine. Adults who wish to be protected from this disease, have certain high-risk conditions, may be exposed to blood or other infectious body fluids, are household contacts or sex partners of hepatitis B positive people, are clients or workers in certain care facilities, or travel to or work in countries with a high rate of hepatitis B should be immunized.  Haemophilus influenzae type b (Hib) vaccine. A previously unvaccinated person with asplenia or sickle cell disease or having a scheduled splenectomy should receive 1 dose of Hib vaccine. Regardless of previous immunization, a recipient of a hematopoietic stem cell transplant should receive a 3-dose series 6-12 months after her successful transplant. Hib vaccine is not recommended for adults with HIV infection. Preventive Services / Frequency Ages 64 to 68 years  Blood pressure check.** / Every 1 to 2 years.  Lipid and cholesterol check.** / Every 5 years beginning at age  22.  Clinical breast exam.** / Every 3 years for women in their 88s and 53s.  BRCA-related cancer risk assessment.** / For women who have family members with a BRCA-related cancer (breast, ovarian, tubal, or peritoneal cancers).  Pap test.** / Every 2 years from ages 90 through 51. Every 3 years starting at age 21 through age 56 or 3 with a history of 3 consecutive normal Pap tests.  HPV screening.** / Every 3 years from ages 24 through ages 1 to 46 with a history of 3 consecutive normal Pap tests.  Hepatitis C blood test.** / For any individual with known risks for hepatitis C.  Skin self-exam. / Monthly.  Influenza vaccine. / Every year.  Tetanus, diphtheria, and acellular pertussis (Tdap, Td) vaccine.** / Consult your health care provider. Pregnant women should receive 1 dose of Tdap vaccine during each pregnancy. 1 dose of Td every 10 years.  Varicella vaccine.** / Consult your health care provider. Pregnant females who do not have evidence of immunity should receive the first dose after pregnancy.  HPV vaccine. / 3 doses over 6 months, if 72 and younger. The vaccine is not recommended for use in pregnant females. However, pregnancy testing is not needed before receiving a dose.  Measles, mumps, rubella (MMR) vaccine.** / You need at least 1 dose of MMR if you were born in 1957 or later. You may also need a 2nd dose. For females of childbearing age, rubella immunity should be determined. If there is no evidence of immunity, females who are not pregnant should be vaccinated. If there is no evidence of immunity, females who are pregnant should delay immunization until after pregnancy.  Pneumococcal 13-valent conjugate (PCV13) vaccine.** / Consult your health care provider.  Pneumococcal polysaccharide (PPSV23) vaccine.** / 1 to 2 doses if you smoke cigarettes or if you have certain conditions.  Meningococcal vaccine.** /  1 dose if you are age 19 to 21 years and a first-year college  student living in a residence hall, or have one of several medical conditions, you need to get vaccinated against meningococcal disease. You may also need additional booster doses.  Hepatitis A vaccine.** / Consult your health care provider.  Hepatitis B vaccine.** / Consult your health care provider.  Haemophilus influenzae type b (Hib) vaccine.** / Consult your health care provider. Ages 40 to 64 years  Blood pressure check.** / Every 1 to 2 years.  Lipid and cholesterol check.** / Every 5 years beginning at age 20 years.  Lung cancer screening. / Every year if you are aged 55-80 years and have a 30-pack-year history of smoking and currently smoke or have quit within the past 15 years. Yearly screening is stopped once you have quit smoking for at least 15 years or develop a health problem that would prevent you from having lung cancer treatment.  Clinical breast exam.** / Every year after age 40 years.  BRCA-related cancer risk assessment.** / For women who have family members with a BRCA-related cancer (breast, ovarian, tubal, or peritoneal cancers).  Mammogram.** / Every year beginning at age 40 years and continuing for as long as you are in good health. Consult with your health care provider.  Pap test.** / Every 3 years starting at age 30 years through age 65 or 70 years with a history of 3 consecutive normal Pap tests.  HPV screening.** / Every 3 years from ages 30 years through ages 65 to 70 years with a history of 3 consecutive normal Pap tests.  Fecal occult blood test (FOBT) of stool. / Every year beginning at age 50 years and continuing until age 75 years. You may not need to do this test if you get a colonoscopy every 10 years.  Flexible sigmoidoscopy or colonoscopy.** / Every 5 years for a flexible sigmoidoscopy or every 10 years for a colonoscopy beginning at age 50 years and continuing until age 75 years.  Hepatitis C blood test.** / For all people born from 1945 through  1965 and any individual with known risks for hepatitis C.  Skin self-exam. / Monthly.  Influenza vaccine. / Every year.  Tetanus, diphtheria, and acellular pertussis (Tdap/Td) vaccine.** / Consult your health care provider. Pregnant women should receive 1 dose of Tdap vaccine during each pregnancy. 1 dose of Td every 10 years.  Varicella vaccine.** / Consult your health care provider. Pregnant females who do not have evidence of immunity should receive the first dose after pregnancy.  Zoster vaccine.** / 1 dose for adults aged 60 years or older.  Measles, mumps, rubella (MMR) vaccine.** / You need at least 1 dose of MMR if you were born in 1957 or later. You may also need a 2nd dose. For females of childbearing age, rubella immunity should be determined. If there is no evidence of immunity, females who are not pregnant should be vaccinated. If there is no evidence of immunity, females who are pregnant should delay immunization until after pregnancy.  Pneumococcal 13-valent conjugate (PCV13) vaccine.** / Consult your health care provider.  Pneumococcal polysaccharide (PPSV23) vaccine.** / 1 to 2 doses if you smoke cigarettes or if you have certain conditions.  Meningococcal vaccine.** / Consult your health care provider.  Hepatitis A vaccine.** / Consult your health care provider.  Hepatitis B vaccine.** / Consult your health care provider.  Haemophilus influenzae type b (Hib) vaccine.** / Consult your health care provider. Ages 65   years and over  Blood pressure check.** / Every 1 to 2 years.  Lipid and cholesterol check.** / Every 5 years beginning at age 22 years.  Lung cancer screening. / Every year if you are aged 73-80 years and have a 30-pack-year history of smoking and currently smoke or have quit within the past 15 years. Yearly screening is stopped once you have quit smoking for at least 15 years or develop a health problem that would prevent you from having lung cancer  treatment.  Clinical breast exam.** / Every year after age 4 years.  BRCA-related cancer risk assessment.** / For women who have family members with a BRCA-related cancer (breast, ovarian, tubal, or peritoneal cancers).  Mammogram.** / Every year beginning at age 40 years and continuing for as long as you are in good health. Consult with your health care provider.  Pap test.** / Every 3 years starting at age 9 years through age 34 or 91 years with 3 consecutive normal Pap tests. Testing can be stopped between 65 and 70 years with 3 consecutive normal Pap tests and no abnormal Pap or HPV tests in the past 10 years.  HPV screening.** / Every 3 years from ages 57 years through ages 64 or 45 years with a history of 3 consecutive normal Pap tests. Testing can be stopped between 65 and 70 years with 3 consecutive normal Pap tests and no abnormal Pap or HPV tests in the past 10 years.  Fecal occult blood test (FOBT) of stool. / Every year beginning at age 15 years and continuing until age 17 years. You may not need to do this test if you get a colonoscopy every 10 years.  Flexible sigmoidoscopy or colonoscopy.** / Every 5 years for a flexible sigmoidoscopy or every 10 years for a colonoscopy beginning at age 86 years and continuing until age 71 years.  Hepatitis C blood test.** / For all people born from [birth year] through 1967 and any individual with known risks for hepatitis C.  Osteoporosis screening.** / A one-time screening for women ages 83 years and over and women at risk for fractures or osteoporosis.  Skin self-exam. / Monthly.  Influenza vaccine. / Every year.  Tetanus, diphtheria, and acellular pertussis (Tdap/Td) vaccine.** / 1 dose of Td every 10 years.  Varicella vaccine.** / Consult your health care provider.  Zoster vaccine.** / 1 dose for adults aged 61 years or older.  Pneumococcal 13-valent conjugate (PCV13) vaccine.** / Consult your health care provider.  Pneumococcal  polysaccharide (PPSV23) vaccine.** / 1 dose for all adults aged 28 years and older.  Meningococcal vaccine.** / Consult your health care provider.  Hepatitis A vaccine.** / Consult your health care provider.  Hepatitis B vaccine.** / Consult your health care provider.  Haemophilus influenzae type b (Hib) vaccine.** / Consult your health care provider. ** Family history and personal history of risk and conditions may change your health care provider's recommendations. Document Released: 01/23/2002 Document Revised: 04/13/2014 Document Reviewed: 04/24/2011 [facility] Patient Information 2015 [place], [place]. This information is not intended to replace advice given to you by your health care provider. Make sure you discuss any questions you have with your health care provider.

## 2015-01-22 NOTE — Progress Notes (Signed)
Pre visit review using our clinic review tool, if applicable. No additional management support is needed unless otherwise documented below in the visit note. 

## 2015-01-22 NOTE — Assessment & Plan Note (Signed)
contrave Discussed with pt diet and exercise rto 3 months or sooner prn

## 2015-02-03 ENCOUNTER — Telehealth: Payer: Self-pay | Admitting: Family Medicine

## 2015-02-03 NOTE — Telephone Encounter (Signed)
Caller name: Doyle AskewLerner, Fidelis R Relation to pt: self  Call back number: [phone]   Reason for call:  Pt inquiring about lab results from [facility].

## 2015-02-04 NOTE — Telephone Encounter (Signed)
Spoke with patient and she [facility] and she had her ;labs done at [facility] in her building. I made her aware I do not have the results yet. I will call [facility].      KP

## 2015-02-09 NOTE — Telephone Encounter (Signed)
I called [facility] again and the CSR said she would fax again.    KP

## 2015-02-09 NOTE — Telephone Encounter (Signed)
Pt inquiring about lab results taken at [facility]. Please advise

## 2015-02-09 NOTE — Telephone Encounter (Signed)
Labs was faxed.//AB/CMA

## 2015-02-11 NOTE — Telephone Encounter (Signed)
Patient has been made aware of her results of elevated cholesterol and she has agreed to recheck in 3 mos. She requested a copy to be faxed to her work. Results faxed.      KP

## 2015-02-19 ENCOUNTER — Encounter: Payer: Self-pay | Admitting: Family Medicine

## 2015-03-16 ENCOUNTER — Ambulatory Visit (INDEPENDENT_AMBULATORY_CARE_PROVIDER_SITE_OTHER): Payer: 59 | Admitting: Physician Assistant

## 2015-03-16 ENCOUNTER — Encounter: Payer: Self-pay | Admitting: Physician Assistant

## 2015-03-16 VITALS — BP 116/70 | HR 48 | Ht 65.0 in | Wt 171.0 lb

## 2015-03-16 DIAGNOSIS — M542 Cervicalgia: Secondary | ICD-10-CM | POA: Diagnosis not present

## 2015-03-16 DIAGNOSIS — M62838 Other muscle spasm: Secondary | ICD-10-CM | POA: Diagnosis not present

## 2015-03-16 DIAGNOSIS — G43009 Migraine without aura, not intractable, without status migrainosus: Secondary | ICD-10-CM

## 2015-03-16 MED ORDER — ONDANSETRON HCL 8 MG PO TABS: 8.0000 mg | ORAL_TABLET | Freq: Three times a day (TID) | ORAL | Status: DC | PRN

## 2015-03-16 MED ORDER — HYDROCODONE-ACETAMINOPHEN 7.5-325 MG PO TABS: 1.0000 | ORAL_TABLET | Freq: Four times a day (QID) | ORAL | Status: DC | PRN

## 2015-03-16 NOTE — Patient Instructions (Signed)
OnabotulinumtoxinA injection (Medical Use) What is this medicine? ONABOTULINUMTOXINA (o na BOTT you lye num tox in eh) is a neuro-muscular blocker. This medicine is used to treat crossed eyes, eyelid spasms, severe neck muscle spasms, and elbow, wrist, and finger muscle spasms. It is also used to treat excessive underarm sweating, to prevent chronic migraine headaches, and to treat loss of bladder control due to neurologic conditions such as multiple sclerosis or spinal cord injury. This medicine may be used for other purposes; ask your health care provider or pharmacist if you have questions. COMMON BRAND NAME(S): Botox What should I tell my health care provider before I take this medicine? They need to know if you have any of these conditions: -breathing problems -cerebral palsy spasms -difficulty urinating -heart problems -history of surgery where this medicine is going to be used -infection at the site where this medicine is going to be used -myasthenia gravis or other neurologic disease -nerve or muscle disease -surgery plans -take medicines that treat or prevent blood clots -thyroid problems -an unusual or allergic reaction to botulinum toxin, albumin, other medicines, foods, dyes, or preservatives -pregnant or trying to get pregnant -breast-feeding How should I use this medicine? This medicine is for injection into a muscle. It is given by a health care professional in a hospital or clinic setting. Talk to your pediatrician regarding the use of this medicine in children. While this drug may be prescribed for children as young as 12 years old for selected conditions, precautions do apply. Overdosage: If you think you have taken too much of this medicine contact a poison control center or emergency room at once. NOTE: This medicine is only for you. Do not share this medicine with others. What if I miss a dose? This does not apply. What may interact with this  medicine? -aminoglycoside antibiotics like gentamicin, neomycin, tobramycin -muscle relaxants -other botulinum toxin injections This list may not describe all possible interactions. Give your health care provider a list of all the medicines, herbs, non-prescription drugs, or dietary supplements you use. Also tell them if you smoke, drink alcohol, or use illegal drugs. Some items may interact with your medicine. What should I watch for while using this medicine? Visit your doctor for regular check ups. This medicine will cause weakness in the muscle where it is injected. Tell your doctor if you feel unusually weak in other muscles. Get medical help right away if you have problems with breathing, swallowing, or talking. This medicine might make your eyelids droop or make you see blurry or double. If you have weak muscles or trouble seeing do not drive a car, use machinery, or do other dangerous activities. This medicine contains albumin from human blood. It may be possible to pass an infection in this medicine, but no cases have been reported. Talk to your doctor about the risks and benefits of this medicine. If your activities have been limited by your condition, go back to your regular routine slowly after treatment with this medicine. What side effects may I notice from receiving this medicine? Side effects that you should report to your doctor or health care professional as soon as possible: -allergic reactions like skin rash, itching or hives, swelling of the face, lips, or tongue -breathing problems -changes in vision -chest pain or tightness -eye irritation, pain -fast, irregular heartbeat -infection -numbness -speech problems -swallowing problems -unusual weakness Side effects that usually do not require medical attention (report to your doctor or health care professional if they continue or   are bothersome): -bruising or pain at site where injected -drooping eyelid -dry eyes or  mouth -headache -muscles aches, pains -sensitivity to light -tearing This list may not describe all possible side effects. Call your doctor for medical advice about side effects. You may report side effects to FDA at 1-800-FDA-1088. Where should I keep my medicine? This drug is given in a hospital or clinic and will not be stored at home. NOTE: This sheet is a summary. It may not cover all possible information. If you have questions about this medicine, talk to your doctor, pharmacist, or health care provider.  2015, Elsevier/Gold Standard. (2012-08-26 17:30:24)  

## 2015-03-16 NOTE — Progress Notes (Signed)
Patient ID: Doyle Askewmanda R Ruane, female   DOB: 07-Nov-1961, 54 y.o.   MRN: [id number] S: Pt in office today for follow up of migraine and Botox injections. Her Botox has been working very well for migraine prevention. She uses 100 units q 3 months.   She reports needing refills of zofran and norco.  She gets nausea frequently that occasionally leads to migraine.  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    O: BP 116/70 mmHg  Pulse 48  Ht 5\' 5"  (1.651 m)  Wt 171 lb (77.565 kg)  BMI 28.46 kg/m2 GENERAL: Well-developed, well-nourished female in no acute distress.  HEENT: Normocephalic, atraumatic.  NECK: Tender to palpation over noted muscle spasm in cervical paraspinal muscles and trapezius bilaterally LUNGS: Normal rate. Clear to auscultation bilaterally.  HEART: Regular rate and rhythm with no adventitious sounds.  EXTREMITIES: No cyanosis, clubbing, or edema, 2+ distal pulses.   Botox Procedure Note Lot # [id number] Expiration Date 10/18   Injection Sites for Migraines  Botox 100 units was injected using the dosage in the table above in the pattern shown above. (With addition of 0.025cc injected lateral to eye bilaterally)  A: Migraine  Muscle spasm   P: Botox 100 units injected today.  Refills given for Hydrocodone and Zofran RTC 3 Months.

## 2015-04-16 ENCOUNTER — Other Ambulatory Visit: Payer: Self-pay | Admitting: Family Medicine

## 2015-04-16 ENCOUNTER — Encounter: Payer: Self-pay | Admitting: Family Medicine

## 2015-04-16 ENCOUNTER — Ambulatory Visit (INDEPENDENT_AMBULATORY_CARE_PROVIDER_SITE_OTHER): Payer: 59 | Admitting: Family Medicine

## 2015-04-16 VITALS — BP 120/80 | HR 54 | Temp 97.7°F | Wt 170.2 lb

## 2015-04-16 DIAGNOSIS — R197 Diarrhea, unspecified: Secondary | ICD-10-CM

## 2015-04-16 DIAGNOSIS — K219 Gastro-esophageal reflux disease without esophagitis: Secondary | ICD-10-CM | POA: Diagnosis not present

## 2015-04-16 LAB — HEMOGLOBIN A1C
HEMOGLOBIN A1C: 5.5 % (ref <5.7)
Mean Plasma Glucose: 111 mg/dL (ref <117)

## 2015-04-16 LAB — BASIC METABOLIC PANEL
BUN: 25 mg/dL — AB (ref 6–23)
CO2: 24 meq/L (ref 19–32)
Calcium: 8.6 mg/dL (ref 8.4–10.5)
Chloride: 104 mEq/L (ref 96–112)
Creat: 0.83 mg/dL (ref 0.50–1.10)
GLUCOSE: 87 mg/dL (ref 70–99)
Potassium: 4.6 mEq/L (ref 3.5–5.3)
Sodium: 139 mEq/L (ref 135–145)

## 2015-04-16 MED ORDER — HYOSCYAMINE SULFATE 0.125 MG SL SUBL: 0.1250 mg | SUBLINGUAL_TABLET | SUBLINGUAL | Status: DC | PRN

## 2015-04-16 NOTE — Progress Notes (Signed)
Patient ID: [name], female    DOB: October 29, 1961  Age: 54 y.o. MRN: [id number]    Subjective:  Subjective HPI [name] presents for 9 day hx diarrhea .  She did eat [place]una just before symptoms started.  + feeling hot-- did not take temp.  No nv   Review of Systems  Constitutional: Positive for chills. Negative for activity change, appetite change, fatigue and unexpected weight change.  Respiratory: Negative for cough and shortness of breath.   Cardiovascular: Negative for chest pain and palpitations.  Gastrointestinal: Positive for abdominal pain, diarrhea and abdominal distention. Negative for nausea and vomiting.  Endocrine: Negative for cold intolerance, heat intolerance, polydipsia, polyphagia and polyuria.  Musculoskeletal: Negative for myalgias and arthralgias.  Psychiatric/Behavioral: Negative for behavioral problems and dysphoric mood. The patient is not nervous/anxious.     History Past Medical History  Diagnosis Date  . Migraines   . Infertility, female     She has past surgical history that includes Shoulder surgery and Ankle surgery.   Her family history includes Cancer in her maternal grandmother; Hypertension in her mother; Stroke in an other family member.She reports that she quit smoking about 23 years ago. Her smoking use included Cigarettes. She has never used smokeless tobacco. She reports that she drinks alcohol. She reports that she does not use illicit drugs.  Current Outpatient Prescriptions on File Prior to Visit  Medication Sig Dispense Refill  . ALPRAZolam (XANAX) 0.25 MG tablet Take 1 tablet (0.25 mg total) by mouth at bedtime as needed for sleep. 30 tablet 3  . baclofen (LIORESAL) 10 MG tablet Take 1 tablet (10 mg total) by mouth every 8 (eight) hours as needed. 60 each 3  . BOTOX 100 UNITS SOLR injection INJECT 100 UNITS INTO THE MUSCLE ONCE. 1 vial 3  . celecoxib (CELEBREX) 200 MG capsule Take 1 capsule (200 mg total) by mouth 2 (two) times  daily. 60 capsule 6  . cetirizine (ZYRTEC) 10 MG tablet Take 10 mg by mouth daily.    . fluticasone (FLONASE) 50 MCG/ACT nasal spray Place 2 sprays into both nostrils daily. 16 g 1  . HYDROcodone-acetaminophen (NORCO) 7.5-325 MG per tablet Take 1 tablet by mouth every 6 (six) hours as needed. 30 tablet 0  . metoprolol succinate (TOPROL-XL) 50 MG 24 hr tablet Take 1 tablet (50 mg total) by mouth daily. 30 tablet 11  . naratriptan (AMERGE) 2.5 MG tablet Take 1 tablet (2.5 mg total) by mouth as needed. Take one (1) tablet at onset of headache; if returns or does not resolve, may repeat after 4 hours; do not exceed five (5) mg in 24 hours. 10 tablet 11  . ondansetron (ZOFRAN) 8 MG tablet Take 1 tablet (8 mg total) by mouth every 8 (eight) hours as needed. 20 tablet 3  . sertraline (ZOLOFT) 100 MG tablet Take 1 tablet (100 mg total) by mouth daily. 30 tablet 11  . SUMAtriptan (IMITREX) 100 MG tablet Take 1 tablet (100 mg total) by mouth once as needed for migraine. 9 tablet 11  . tiZANidine (ZANAFLEX) 4 MG tablet Take 1 tablet (4 mg total) by mouth 3 (three) times daily. 90 tablet 2  . topiramate (TOPAMAX) 50 MG tablet Take 1 tablet (50 mg total) by mouth 3 (three) times daily. 90 tablet 11  . traZODone (DESYREL) 50 MG tablet Take 0.5-1 tablets (25-50 mg total) by mouth at bedtime as needed for sleep. 30 tablet 2   No current facility-administered medications on  file prior to visit.     Objective:  Objective Physical Exam  Constitutional: She is oriented to person, place, and time. She appears well-developed and well-nourished.  HENT:  Head: Normocephalic and atraumatic.  Eyes: Conjunctivae and EOM are normal.  Neck: Normal range of motion. Neck supple. No JVD present. Carotid bruit is not present. No thyromegaly present.  Cardiovascular: Normal rate, regular rhythm and normal heart sounds.   No murmur heard. Pulmonary/Chest: Effort normal and breath sounds normal. No respiratory distress. She  has no wheezes. She has no rales. She exhibits no tenderness.  Abdominal: Soft. Bowel sounds are normal. She exhibits no distension and no mass. There is no tenderness. There is no rebound and no guarding.  Musculoskeletal: She exhibits no edema.  Neurological: She is alert and oriented to person, place, and time.  Psychiatric: She has a normal mood and affect. Thought content normal.   BP 120/80 mmHg  Pulse 54  Temp(Src) 97.7 F (36.5 C) (Oral)  Wt 170 lb 3.2 oz (77.202 kg)  SpO2 97% Wt Readings from Last 3 Encounters:  04/16/15 170 lb 3.2 oz (77.202 kg)  03/16/15 171 lb (77.565 kg)  01/22/15 173 lb 6.4 oz (78.654 kg)     No results found for: WBC, HGB, HCT, PLT, GLUCOSE, CHOL, TRIG, HDL, LDLDIRECT, LDLCALC, ALT, AST, NA, K, CL, CREATININE, BUN, CO2, TSH, PSA, INR, GLUF, HGBA1C, MICROALBUR  Mm Digital Screening  04/29/2013   *RADIOLOGY REPORT*  Clinical Data: Screening.  DIGITAL BILATERAL SCREENING MAMMOGRAM WITH CAD DIGITAL BREAST TOMOSYNTHESIS  Digital breast tomosynthesis images are acquired in two projections.  These images are reviewed in combination with the digital mammogram, confirming the findings below.  Comparison:  Previous exams.  FINDINGS:  ACR Breast Density Category 2: There is a scattered fibroglandular pattern.  No suspicious masses, architectural distortion, or calcifications are present.  Images were processed with CAD.  IMPRESSION: No mammographic evidence of malignancy.  A result letter of this screening mammogram will be mailed directly to the patient.  RECOMMENDATION: Screening mammogram in one year. (Code:SM-B-01Y)  BI-RADS CATEGORY 1:  Negative.   Original Report Authenticated By: Beckie SaltsSteven Reid, M.D.     Assessment & Plan:  Plan I have discontinued [name]'s montelukast and Naltrexone-Bupropion HCl ER. I am also having her start on hyoscyamine. Additionally, I am having her maintain her BOTOX, baclofen, topiramate, SUMAtriptan, metoprolol succinate, cetirizine,  fluticasone, traZODone, naratriptan, ALPRAZolam, celecoxib, tiZANidine, sertraline, HYDROcodone-acetaminophen, ondansetron, famotidine, and esomeprazole.  Meds ordered this encounter  Medications  . famotidine (PEPCID) 10 MG tablet    Sig: Take 10 mg by mouth 2 (two) times daily.  Marland Kitchen. esomeprazole (NEXIUM) 20 MG capsule    Sig: Take 20 mg by mouth daily at 12 noon.  . hyoscyamine (LEVSIN/SL) 0.125 MG SL tablet    Sig: Place 1 tablet (0.125 mg total) under the tongue every 4 (four) hours as needed.    Dispense:  30 tablet    Refill:  0    Problem List Items Addressed This Visit    Diarrhea - Primary    Has slowed down to every other day Few x each day Suspect from [place]una Check culture/and c diff levsin prn Consider GI for IBS if no relief      Relevant Medications   hyoscyamine (LEVSIN/SL) 0.125 MG SL tablet   Other Relevant Orders   Stool culture   Clostridium difficile EIA   Basic metabolic panel   CBC with Differential/Platelet   Hemoglobin A1c  TSH      Follow-up: Return if symptoms worsen or fail to improve.  Loreen Freud, DO      Subjective:     [name] is a 54 y.o. female who presents for evaluation of abdominal pain. Onset was 9 days ago. Symptoms have been unchanged. The pain is described as colicky, and is 8/10 in intensity. Pain is located in the mid abd without radiation.  Aggravating factors: none.  Alleviating factors: none. Associated symptoms: belching, diarrhea and nausea. The patient denies constipation, dysuria, fever, flatus, frequency, headache, hematochezia, hematuria, melena, myalgias and sweats.  Past Surgical History  Procedure Laterality Date  . Shoulder surgery      Left  . Ankle surgery      Left    Review of Systems Constitutional: negative for anorexia, chills, fatigue, fevers, malaise, night sweats, sweats and weight loss Respiratory: negative for asthma, chronic bronchitis, cough, dyspnea on exertion, emphysema, hemoptysis,  pleurisy/chest pain, pneumonia, sputum, stridor and wheezing Cardiovascular: negative for chest pain, chest pressure/discomfort, claudication, dyspnea, exertional chest pressure/discomfort, fatigue, irregular heart beat, lower extremity edema, near-syncope, orthopnea, palpitations, paroxysmal nocturnal dyspnea, syncope and tachypnea Gastrointestinal: negative for abdominal pain, constipation, dyspepsia, dysphagia, jaundice, melena, nausea and odynophagia Endocrine: negative     Objective:    BP 120/80 mmHg  Pulse 54  Temp(Src) 97.7 F (36.5 C) (Oral)  Wt 170 lb 3.2 oz (77.202 kg)  SpO2 97% General appearance: alert, cooperative, appears stated age and no distress Neck: no adenopathy, supple, symmetrical, trachea midline and thyroid not enlarged, symmetric, no tenderness/mass/nodules Lungs: clear to auscultation bilaterally Heart: S1, S2 normal Abdomen: soft, non-tender; bowel sounds normal; no masses,  no organomegaly    Assessment:    Abdominal pain, likely secondary to IBS .    Plan:    The diagnosis was discussed with the patient and evaluation and treatment plans outlined. See orders for lab and imaging studies. Adhere to simple, bland diet. Initiate empiric trial of fiber therapy. Initiate trial of anticholinergic medications for IBS. Further follow-up plans will be based on outcome of lab/imaging studies; see orders. Follow up as needed.

## 2015-04-16 NOTE — Patient Instructions (Signed)

## 2015-04-16 NOTE — Assessment & Plan Note (Signed)
Has slowed down to every other day Few x each day Suspect from [place]una Check culture/and c diff levsin prn Consider GI for IBS if no relief

## 2015-04-16 NOTE — Progress Notes (Signed)
Pre visit review using our clinic review tool, if applicable. No additional management support is needed unless otherwise documented below in the visit note. 

## 2015-04-17 LAB — CBC WITH DIFFERENTIAL/PLATELET
BASOS PCT: 0 % (ref 0–1)
Basophils Absolute: 0 10*3/uL (ref 0.0–0.1)
EOS PCT: 3 % (ref 0–5)
Eosinophils Absolute: 0.2 10*3/uL (ref 0.0–0.7)
HCT: 37.1 % (ref 36.0–46.0)
Hemoglobin: 12.5 g/dL (ref 12.0–15.0)
Lymphocytes Relative: 33 % (ref 12–46)
Lymphs Abs: 2.3 10*3/uL (ref 0.7–4.0)
MCH: 32.2 pg (ref 26.0–34.0)
MCHC: 33.7 g/dL (ref 30.0–36.0)
MCV: 95.6 fL (ref 78.0–100.0)
MONO ABS: 0.5 10*3/uL (ref 0.1–1.0)
MPV: 9.3 fL (ref 8.6–12.4)
Monocytes Relative: 7 % (ref 3–12)
Neutro Abs: 3.9 10*3/uL (ref 1.7–7.7)
Neutrophils Relative %: 57 % (ref 43–77)
PLATELETS: 300 10*3/uL (ref 150–400)
RBC: 3.88 MIL/uL (ref 3.87–5.11)
RDW: 13.4 % (ref 11.5–15.5)
WBC: 6.9 10*3/uL (ref 4.0–10.5)

## 2015-04-17 LAB — C. DIFFICILE GDH AND TOXIN A/B
C. difficile GDH: NOT DETECTED
C. difficile Toxin A/B: NOT DETECTED

## 2015-04-17 LAB — TSH: TSH: 1.245 u[IU]/mL (ref 0.350–4.500)

## 2015-04-20 LAB — STOOL CULTURE

## 2015-05-18 ENCOUNTER — Telehealth: Payer: Self-pay | Admitting: Physician Assistant

## 2015-05-18 DIAGNOSIS — G43009 Migraine without aura, not intractable, without status migrainosus: Secondary | ICD-10-CM

## 2015-05-18 DIAGNOSIS — M542 Cervicalgia: Secondary | ICD-10-CM

## 2015-05-18 MED ORDER — HYDROCODONE-ACETAMINOPHEN 7.5-325 MG PO TABS: 1.0000 | ORAL_TABLET | Freq: Four times a day (QID) | ORAL | Status: DC | PRN

## 2015-05-18 NOTE — Telephone Encounter (Signed)
Pt stopped by office requesting refill of Zofran and Hydrocodone.   Pharmacy called.  There is active rx on file for zofran at pharmacy.  She can call and have transferred if she prefers it at [facility] instead of [facility] pharmacy.  Limited quantity of Hydrocodone prescribed.  Pt expected to make medication last until next scheduled appt.  Will need apt for further medicaiton.  Expect pt to return early July for Botox and can discuss further at that time.

## 2015-06-15 ENCOUNTER — Ambulatory Visit (INDEPENDENT_AMBULATORY_CARE_PROVIDER_SITE_OTHER): Payer: 59 | Admitting: Physician Assistant

## 2015-06-15 ENCOUNTER — Encounter: Payer: Self-pay | Admitting: Physician Assistant

## 2015-06-15 VITALS — BP 141/96 | HR 72 | Ht 65.0 in | Wt 174.0 lb

## 2015-06-15 DIAGNOSIS — F419 Anxiety disorder, unspecified: Secondary | ICD-10-CM

## 2015-06-15 DIAGNOSIS — M542 Cervicalgia: Secondary | ICD-10-CM

## 2015-06-15 DIAGNOSIS — M62838 Other muscle spasm: Secondary | ICD-10-CM | POA: Diagnosis not present

## 2015-06-15 DIAGNOSIS — G43109 Migraine with aura, not intractable, without status migrainosus: Secondary | ICD-10-CM | POA: Diagnosis not present

## 2015-06-15 MED ORDER — BOTOX 100 UNITS IJ SOLR: INTRAMUSCULAR | Status: DC

## 2015-06-15 MED ORDER — HYDROCODONE-ACETAMINOPHEN 7.5-325 MG PO TABS: 1.0000 | ORAL_TABLET | Freq: Four times a day (QID) | ORAL | Status: DC | PRN

## 2015-06-15 MED ORDER — CHLORPROMAZINE HCL 10 MG PO TABS: 10.0000 mg | ORAL_TABLET | Freq: Three times a day (TID) | ORAL | Status: DC | PRN

## 2015-06-15 MED ORDER — METOPROLOL SUCCINATE ER 50 MG PO TB24: 50.0000 mg | ORAL_TABLET | Freq: Every day | ORAL | Status: DC

## 2015-06-15 MED ORDER — ALPRAZOLAM 0.25 MG PO TABS: 0.2500 mg | ORAL_TABLET | Freq: Every evening | ORAL | Status: DC | PRN

## 2015-06-15 NOTE — Progress Notes (Signed)
Patient ID: [name], female   DOB: 1961/07/13, 54 y.o.   MRN: [id number] S: Pt in office today for Botox injections. Her Botox has been working very well for migraine prevention. She reports her headaches are 89% improved since starting Botox.  However she does still get some HA's that last all week long.  Last month she believes barometric pressure caused a 6 day HA for her.  She reports none of her medications touch this sometimes.  She tried triptan, Hydrocodone and even had HA cocktail in urgent care without complete relief.  She desires better rescue but nervous to give up what she already uses.  Severe HAs are debilitating.  She continues to have aura prior to her migraines.   HIT6: 62  Headache days:  Severe: 5 Moderate: 0 Mild: 5 No headache: 18   O: BP 141/96 mmHg  Pulse 72  Ht 5\' 5"  (1.651 m)  Wt 174 lb (78.926 kg)  BMI 28.96 kg/m2  General: Well developed, well nourished, in no acute distress Cardiovascular: Regular rate Respiratory: no respiratory distress Neurologic Exam: gait normal, facial sensation intact and equal bilaterally Musculoskeletal: muscle spasm of cervical and trapezius muscles noted bilaterally  Botox Procedure Note Vial of Botox was :Supplied by Patient Lot # [id number]  Expiration Date Feb 2019  Botox Dosing by [employer] for Chronic Migraine   Injection Sites for Migraines  Botox 100 units was injected using the dosage in the table above in the pattern shown above.  A: Migraine with aura Muscle spasm   P: Botox 100 units injected today.  Refills provided  Will trial Thorazine for rescue.  Pt to use all rescue/acute medications sparingly.  Pt advised of sedating effects.   RTC 3 Months.

## 2015-06-15 NOTE — Patient Instructions (Signed)

## 2015-06-29 ENCOUNTER — Other Ambulatory Visit: Payer: Self-pay | Admitting: Medical

## 2015-09-14 ENCOUNTER — Ambulatory Visit (INDEPENDENT_AMBULATORY_CARE_PROVIDER_SITE_OTHER): Payer: 59 | Admitting: Physician Assistant

## 2015-09-14 ENCOUNTER — Encounter: Payer: Self-pay | Admitting: Physician Assistant

## 2015-09-14 VITALS — BP 124/80 | HR 55 | Resp 16 | Ht 66.0 in | Wt 174.0 lb

## 2015-09-14 DIAGNOSIS — G43009 Migraine without aura, not intractable, without status migrainosus: Secondary | ICD-10-CM

## 2015-09-14 DIAGNOSIS — G4700 Insomnia, unspecified: Secondary | ICD-10-CM

## 2015-09-14 DIAGNOSIS — M62838 Other muscle spasm: Secondary | ICD-10-CM | POA: Diagnosis not present

## 2015-09-14 DIAGNOSIS — G43019 Migraine without aura, intractable, without status migrainosus: Secondary | ICD-10-CM

## 2015-09-14 DIAGNOSIS — M542 Cervicalgia: Secondary | ICD-10-CM

## 2015-09-14 DIAGNOSIS — G43011 Migraine without aura, intractable, with status migrainosus: Secondary | ICD-10-CM

## 2015-09-14 DIAGNOSIS — F419 Anxiety disorder, unspecified: Secondary | ICD-10-CM | POA: Diagnosis not present

## 2015-09-14 MED ORDER — ONDANSETRON HCL 8 MG PO TABS: 8.0000 mg | ORAL_TABLET | Freq: Three times a day (TID) | ORAL | Status: DC | PRN

## 2015-09-14 MED ORDER — SUMATRIPTAN SUCCINATE 100 MG PO TABS: 100.0000 mg | ORAL_TABLET | Freq: Once | ORAL | Status: DC | PRN

## 2015-09-14 MED ORDER — NARATRIPTAN HCL 2.5 MG PO TABS: 2.5000 mg | ORAL_TABLET | ORAL | Status: DC | PRN

## 2015-09-14 MED ORDER — ALPRAZOLAM 0.25 MG PO TABS: 0.2500 mg | ORAL_TABLET | Freq: Every evening | ORAL | Status: DC | PRN

## 2015-09-14 MED ORDER — HYDROCODONE-ACETAMINOPHEN 7.5-325 MG PO TABS: 1.0000 | ORAL_TABLET | Freq: Four times a day (QID) | ORAL | Status: DC | PRN

## 2015-09-14 MED ORDER — TIZANIDINE HCL 4 MG PO TABS: 4.0000 mg | ORAL_TABLET | Freq: Three times a day (TID) | ORAL | Status: DC

## 2015-09-14 MED ORDER — CHLORPROMAZINE HCL 25 MG PO TABS: 25.0000 mg | ORAL_TABLET | Freq: Three times a day (TID) | ORAL | Status: DC

## 2015-09-14 MED ORDER — BACLOFEN 10 MG PO TABS: 10.0000 mg | ORAL_TABLET | Freq: Three times a day (TID) | ORAL | Status: DC | PRN

## 2015-09-14 MED ORDER — TOPIRAMATE 50 MG PO TABS: 50.0000 mg | ORAL_TABLET | Freq: Three times a day (TID) | ORAL | Status: DC

## 2015-09-14 NOTE — Progress Notes (Signed)
Patient ID: [name], female   DOB: [birth year], 54 y.o.   MRN: [id number] S: Pt in office today for refills and Botox injections. Her Botox has been working very well for migraine prevention. Although she notes getting a migraine once monthly that lasts up to 7 days.  She uses multiple medications to treat this but it keeps coming back.  She has not yet trialed Thorazine as her pharmacy told her she did not have an active prescription.  She would like to try this.    O: BP 124/80 mmHg  Pulse 55  Resp 16  Ht  (1.676 m)  Wt 174 lb (78.926 kg)  BMI 28.10 kg/m2 General: Well developed, well nourished, in no acute distress Cardiovascular: Regular rate Respiratory: No resp distress Skin: Warm, Dry with minimal bleeding at injection sites Psychological: Oriented with normal mood  Botox Procedure Note Vial of Botox was : Supplied by Patient Lot #  [id number] Expiration Date: May 2019   Botox Dosing by [employer] Group for Chronic Migraine   Injection Sites for Migraines  Botox 100 units was injected using the dosage in the table above in the pattern shown above.  A: Migraine without aura - intractable- worsening Muscle spasm - stable Insomnia - unchanged  P: Botox 100 units injected today.  Multiple medications refilled today.  See Med list.  Instructions given to NOT use Imitrex and Amerge within 24 hours of each other.   Thorazine may not be combined with Alprazolam.   May consider increasing to 200units of Botox if she continues to have intractable migraine.   RTC 3 Months.

## 2015-09-14 NOTE — Patient Instructions (Signed)
OnabotulinumtoxinA injection (Medical Use) What is this medicine? ONABOTULINUMTOXINA (o na BOTT you lye num tox in eh) is a neuro-muscular blocker. This medicine is used to treat crossed eyes, eyelid spasms, severe neck muscle spasms, and elbow, wrist, and finger muscle spasms. It is also used to treat excessive underarm sweating, to prevent chronic migraine headaches, and to treat loss of bladder control due to neurologic conditions such as multiple sclerosis or spinal cord injury. This medicine may be used for other purposes; ask your health care provider or pharmacist if you have questions. COMMON BRAND NAME(S): Botox What should I tell my health care provider before I take this medicine? They need to know if you have any of these conditions: -breathing problems -cerebral palsy spasms -difficulty urinating -heart problems -history of surgery where this medicine is going to be used -infection at the site where this medicine is going to be used -myasthenia gravis or other neurologic disease -nerve or muscle disease -surgery plans -take medicines that treat or prevent blood clots -thyroid problems -an unusual or allergic reaction to botulinum toxin, albumin, other medicines, foods, dyes, or preservatives -pregnant or trying to get pregnant -breast-feeding How should I use this medicine? This medicine is for injection into a muscle. It is given by a health care professional in a hospital or clinic setting. Talk to your pediatrician regarding the use of this medicine in children. While this drug may be prescribed for children as young as 12 years old for selected conditions, precautions do apply. Overdosage: If you think you have taken too much of this medicine contact a poison control center or emergency room at once. NOTE: This medicine is only for you. Do not share this medicine with others. What if I miss a dose? This does not apply. What may interact with this  medicine? -aminoglycoside antibiotics like gentamicin, neomycin, tobramycin -muscle relaxants -other botulinum toxin injections This list may not describe all possible interactions. Give your health care provider a list of all the medicines, herbs, non-prescription drugs, or dietary supplements you use. Also tell them if you smoke, drink alcohol, or use illegal drugs. Some items may interact with your medicine. What should I watch for while using this medicine? Visit your doctor for regular check ups. This medicine will cause weakness in the muscle where it is injected. Tell your doctor if you feel unusually weak in other muscles. Get medical help right away if you have problems with breathing, swallowing, or talking. This medicine might make your eyelids droop or make you see blurry or double. If you have weak muscles or trouble seeing do not drive a car, use machinery, or do other dangerous activities. This medicine contains albumin from human blood. It may be possible to pass an infection in this medicine, but no cases have been reported. Talk to your doctor about the risks and benefits of this medicine. If your activities have been limited by your condition, go back to your regular routine slowly after treatment with this medicine. What side effects may I notice from receiving this medicine? Side effects that you should report to your doctor or health care professional as soon as possible: -allergic reactions like skin rash, itching or hives, swelling of the face, lips, or tongue -breathing problems -changes in vision -chest pain or tightness -eye irritation, pain -fast, irregular heartbeat -infection -numbness -speech problems -swallowing problems -unusual weakness Side effects that usually do not require medical attention (report to your doctor or health care professional if they continue or   are bothersome): -bruising or pain at site where injected -drooping eyelid -dry eyes or  mouth -headache -muscles aches, pains -sensitivity to light -tearing This list may not describe all possible side effects. Call your doctor for medical advice about side effects. You may report side effects to FDA at 1-800-FDA-1088. Where should I keep my medicine? This drug is given in a hospital or clinic and will not be stored at home. NOTE: This sheet is a summary. It may not cover all possible information. If you have questions about this medicine, talk to your doctor, pharmacist, or health care provider.  2015, Elsevier/Gold Standard. (2012-08-26 17:30:24)  

## 2015-10-20 ENCOUNTER — Encounter: Payer: Self-pay | Admitting: Allergy and Immunology

## 2015-10-20 ENCOUNTER — Ambulatory Visit (INDEPENDENT_AMBULATORY_CARE_PROVIDER_SITE_OTHER): Payer: 59 | Admitting: Allergy and Immunology

## 2015-10-20 VITALS — BP 112/62 | HR 72 | Temp 97.7°F | Resp 16 | Ht 65.0 in | Wt 170.2 lb

## 2015-10-20 DIAGNOSIS — R053 Chronic cough: Secondary | ICD-10-CM | POA: Insufficient documentation

## 2015-10-20 DIAGNOSIS — R05 Cough: Secondary | ICD-10-CM | POA: Diagnosis not present

## 2015-10-20 DIAGNOSIS — R059 Cough, unspecified: Secondary | ICD-10-CM

## 2015-10-20 DIAGNOSIS — J310 Chronic rhinitis: Secondary | ICD-10-CM

## 2015-10-20 MED ORDER — AZELASTINE-FLUTICASONE 137-50 MCG/ACT NA SUSP: 2.0000 | NASAL | Status: DC

## 2015-10-20 MED ORDER — CARBINOXAMINE MALEATE ER 4 MG/5ML PO SUER: 8.0000 mg | Freq: Two times a day (BID) | ORAL | Status: DC

## 2015-10-20 NOTE — Assessment & Plan Note (Addendum)
Non-allergic rhinitis.  All seasonal and perennial aeroallergen skin tests are negative despite a positive histamine control.  Intranasal steroids and intranasal antihistamines are effective for symptoms associated with non-allergic rhinitis, whereas second generation antihistamines such as cetirizine, loratadine and fexofenadine have been found to be ineffective for this condition.  A sample and prescription have been provided for Dymista (azelastine/fluticasone) nasal spray, 1 spray per nostril twice daily as needed. Proper nasal spray technique has been discussed and demonstrated.  Nasal saline lavage as needed has been recommended along with instructions for proper administration.  A sample and prescription have been provided for [facility], 8-16 mg twice a day when necessary.  If symptoms persist or progress, otolaryngology evaluation may be warranted.

## 2015-10-20 NOTE — Assessment & Plan Note (Addendum)
The patient's history and physical examination suggest upper airway cough syndrome.  Spirometry today is normal.  Treatment plan as outlined above.  If coughing persists or progresses we will evaluate further.

## 2015-10-20 NOTE — Patient Instructions (Addendum)
Chronic rhinitis Non-allergic rhinitis.  All seasonal and perennial aeroallergen skin tests are negative despite a positive histamine control.  Intranasal steroids and intranasal antihistamines are effective for symptoms associated with non-allergic rhinitis, whereas second generation antihistamines such as cetirizine, loratadine and fexofenadine have been found to be ineffective for this condition.  A sample and prescription have been provided for Dymista (azelastine/fluticasone) nasal spray, 1 spray per nostril twice daily as needed. Proper nasal spray technique has been discussed and demonstrated.  Nasal saline lavage as needed has been recommended along with instructions for proper administration.  A sample and prescription have been provided [facility], 8-16 mg twice a day when necessary.  If symptoms persist or progress, otolaryngology evaluation may be warranted.  Coughing The patient's history and physical examination suggest upper airway cough syndrome.  Spirometry today is normal.  Treatment plan as outlined above.  If coughing persists or progresses we will evaluate further.    Return in about 4 months (around 02/17/2016), or if symptoms worsen or fail to improve.

## 2015-10-20 NOTE — Progress Notes (Signed)
History of present illness: HPI Comments: [name] is a 54 y.o. female who presents today for her initial consultation of rhinitis.  She reports that 17 years ago, shortly after having had a baby, that she began to experience persistent nasal congestion, rhinorrhea, postnasal drainage, throat irritation, throat clearing, coughing, and itchy/watery eyes.  She occasionally experiences sinus pressure as well.  She has tried loratadine, cetirizine, pseudoephedrine, and fluticasone nasal spray without adequate symptom relief.  She occasionally resorts to Afrin nasal spray to relieve nasal congestion.  She is unable to identify any seasonal variation or symptoms.  Specific triggers include strong aromas and rapid weather changes.   Assessment and plan: Chronic rhinitis Non-allergic rhinitis.  All seasonal and perennial aeroallergen skin tests are negative despite a positive histamine control.  Intranasal steroids and intranasal antihistamines are effective for symptoms associated with non-allergic rhinitis, whereas second generation antihistamines such as cetirizine, loratadine and fexofenadine have been found to be ineffective for this condition.  A sample and prescription have been provided for Dymista (azelastine/fluticasone) nasal spray, 1 spray per nostril twice daily as needed. Proper nasal spray technique has been discussed and demonstrated.  Nasal saline lavage as needed has been recommended along with instructions for proper administration.  A sample and prescription have been provided for [facility] ER, 8-16 mg twice a day when necessary.  If symptoms persist or progress, otolaryngology evaluation may be warranted.  Coughing The patient's history and physical examination suggest upper airway cough syndrome.  Spirometry today is normal.  Treatment plan as outlined above.  If coughing persists or progresses we will evaluate further.    Medications ordered this encounter: Meds  ordered this encounter  Medications  . Azelastine-Fluticasone (DYMISTA) 137-50 MCG/ACT SUSP    Sig: Place 2 sprays into both nostrils 1 day or 1 dose.    Dispense:  1 Bottle    Refill:  5  . Carbinoxamine Maleate ER [facility]) 4 MG/5ML SUER    Sig: Take 8 mg by mouth 2 (two) times daily.    Dispense:  480 mL    Refill:  5    Diagnositics: Spirometry:  Normal.  Please see scanned spirometry results for details. Allergy skin testing: Epicutaneous and intradermal skin tests were negative despite a positive histamine control.     Physical examination: Blood pressure 112/62, pulse 72, temperature 97.7 F (36.5 C), temperature source Oral, resp. rate 16, height  (1.651 m), weight 170 lb 3.1 oz (77.2 kg).  General: Alert, interactive, in no acute distress. HEENT: TMs pearly gray, turbinates moderately edematous without discharge, post-pharynx moderately erythematous. Neck: Supple without lymphadenopathy. Lungs: Clear to auscultation without wheezing, rhonchi or rales. CV: Normal S1, S2 without murmurs. Abdomen: Nondistended, nontender. Skin: Warm and dry, without lesions or rashes. Extremities:  No clubbing, cyanosis or edema. Neuro:   Grossly intact.  Review of systems: Review of Systems  Constitutional: Negative for fever, chills and weight loss.  HENT: Negative for nosebleeds.   Eyes: Negative for blurred vision.  Respiratory: Negative for hemoptysis.   Cardiovascular: Negative for chest pain.  Gastrointestinal: Negative for diarrhea and constipation.  Genitourinary: Negative for dysuria.  Musculoskeletal: Negative for myalgias and joint pain.  Neurological: Positive for headaches. Negative for dizziness.  Endo/Heme/Allergies: Does not bruise/bleed easily.    Past medical history: Past Medical History  Diagnosis Date  . Migraines   . Infertility, female     Past surgical history: Past Surgical History  Procedure Laterality Date  . Shoulder surgery  Left   . Ankle surgery      Left    Family history: Family History  Problem Relation Age of Onset  . Hypertension Mother   . Allergic rhinitis Mother   . Cancer Maternal Grandmother   . Stroke    . Eczema Daughter     Social history: Social History   Social History  . Marital Status: Married    Spouse Name: N/A  . Number of Children: N/A  . Years of Education: N/A   Occupational History  . Not on file.   Social History Main Topics  . Smoking status: Former Smoker    Types: Cigarettes    Quit date: 02/09/1992  . Smokeless tobacco: Never Used  . Alcohol Use: Yes     Comment: 1-2 weekly  . Drug Use: No  . Sexual Activity:    Partners: Male   Other Topics Concern  . Not on file   Social History Narrative   Environmental History:  [name] lives in a 56 year old house with hardwood floors throughout and central air/heat.  There is a House which has access to her bedroom.  She is a former smoker having quit 25 years ago.  Her hobbies include gardening and cycling.  Known medication allergies: No Known Allergies  Outpatient medications:   Medication List       This list is accurate as of: 10/20/15  6:44 PM.  Always use your most recent med list.               ALPRAZolam 0.25 MG tablet  Commonly known as:  XANAX  Take 1 tablet (0.25 mg total) by mouth at bedtime as needed for sleep.     Azelastine-Fluticasone 137-50 MCG/ACT Susp  Commonly known as:  DYMISTA  Place 2 sprays into both nostrils 1 day or 1 dose.     baclofen 10 MG tablet  Commonly known as:  LIORESAL  Take 1 tablet (10 mg total) by mouth every 8 (eight) hours as needed.     BOTOX 100 UNITS Solr injection  Generic drug:  botulinum toxin Type A  INJECT 100 UNITS INTO THE MUSCLE ONCE.     Carbinoxamine Maleate ER 4 MG/5ML Suer  Commonly known as:  KARBINAL ER  Take 8 mg by mouth 2 (two) times daily.     celecoxib 200 MG capsule  Commonly known as:  CELEBREX  Take 1 capsule (200 mg total) by  mouth 2 (two) times daily.     cetirizine 10 MG tablet  Commonly known as:  ZYRTEC  Take 10 mg by mouth daily.     chlorproMAZINE 25 MG tablet  Commonly known as:  THORAZINE  Take 1 tablet (25 mg total) by mouth 3 (three) times daily.     esomeprazole 20 MG capsule  Commonly known as:  NEXIUM  Take 20 mg by mouth daily at 12 noon.     famotidine 10 MG tablet  Commonly known as:  PEPCID  Take 10 mg by mouth 2 (two) times daily.     fluticasone 50 MCG/ACT nasal spray  Commonly known as:  FLONASE  Place 2 sprays into both nostrils daily.     HYDROcodone-acetaminophen 7.5-325 MG tablet  Commonly known as:  NORCO  Take 1 tablet by mouth every 6 (six) hours as needed.     hyoscyamine 0.125 MG SL tablet  Commonly known as:  LEVSIN/SL  Place 1 tablet (0.125 mg total) under the tongue every 4 (four) hours as needed.  metoprolol succinate 50 MG 24 hr tablet  Commonly known as:  TOPROL-XL  Take 1 tablet (50 mg total) by mouth daily.     naratriptan 2.5 MG tablet  Commonly known as:  AMERGE  Take 1 tablet (2.5 mg total) by mouth as needed. Take one (1) tablet at onset of headache; if returns or does not resolve, may repeat after 4 hours; do not exceed five (5) mg in 24 hours.     ondansetron 8 MG tablet  Commonly known as:  ZOFRAN  Take 1 tablet (8 mg total) by mouth every 8 (eight) hours as needed.     sertraline 100 MG tablet  Commonly known as:  ZOLOFT  Take 1 tablet (100 mg total) by mouth daily.     SUMAtriptan 100 MG tablet  Commonly known as:  IMITREX  Take 1 tablet (100 mg total) by mouth once as needed for migraine.     tiZANidine 4 MG tablet  Commonly known as:  ZANAFLEX  Take 1 tablet (4 mg total) by mouth 3 (three) times daily.     topiramate 50 MG tablet  Commonly known as:  TOPAMAX  Take 1 tablet (50 mg total) by mouth 3 (three) times daily.     traZODone 50 MG tablet  Commonly known as:  DESYREL  TAKE 1/2 TO 1 TABLET BY MOUTH AT BEDTIME AS NEEDED FOR  SLEEP.        I appreciate the opportunity to take part in this Mandy's care. Please do not hesitate to contact me with questions.  Sincerely,   R. Jorene Guestarter Raziyah Vanvleck, MD

## 2015-11-25 ENCOUNTER — Emergency Department (INDEPENDENT_AMBULATORY_CARE_PROVIDER_SITE_OTHER)
Admission: EM | Admit: 2015-11-25 | Discharge: 2015-11-25 | Disposition: A | Discharge status: Home / Self Care | Payer: 59 | Source: Home / Self Care | Attending: Family Medicine | Admitting: Family Medicine

## 2015-11-25 ENCOUNTER — Emergency Department (INDEPENDENT_AMBULATORY_CARE_PROVIDER_SITE_OTHER): Payer: 59

## 2015-11-25 ENCOUNTER — Encounter: Payer: Self-pay | Admitting: *Deleted

## 2015-11-25 DIAGNOSIS — J0100 Acute maxillary sinusitis, unspecified: Secondary | ICD-10-CM | POA: Diagnosis not present

## 2015-11-25 DIAGNOSIS — J3489 Other specified disorders of nose and nasal sinuses: Secondary | ICD-10-CM | POA: Diagnosis not present

## 2015-11-25 MED ORDER — PREDNISONE 50 MG PO TABS: ORAL_TABLET | ORAL | Status: DC

## 2015-11-25 MED ORDER — CEFDINIR 300 MG PO CAPS: 300.0000 mg | ORAL_CAPSULE | Freq: Two times a day (BID) | ORAL | Status: DC

## 2015-11-25 NOTE — ED Provider Notes (Signed)
CSN: [id number]     Arrival date & time 11/25/15  1432 History   First MD Initiated Contact with Patient 11/25/15 1442     Chief Complaint  Patient presents with  . Sinus Problem      HPI Comments: Patient has a history of chronic non-allergic rhinitis.  She has had increased sinus congestion for one week, and now two day history of facial pain, aching in maxillary teeth, headache, myalgias, fatigue, and chills/sweats.  No sore throat or cough. Her congestion has not responded to her usual home treatments including Dymista and saline nasal irrigation  The history is provided by the patient.    Past Medical History  Diagnosis Date  . Migraines   . Infertility, female    Past Surgical History  Procedure Laterality Date  . Shoulder surgery      Left  . Ankle surgery      Left   Family History  Problem Relation Age of Onset  . Hypertension Mother   . Allergic rhinitis Mother   . Cancer Maternal Grandmother   . Stroke    . Eczema Daughter    Social History  Substance Use Topics  . Smoking status: Former Smoker    Types: Cigarettes    Quit date: 02/09/1992  . Smokeless tobacco: Never Used  . Alcohol Use: Yes     Comment: 1-2 weekly   OB History    Gravida Para Term Preterm AB TAB SAB Ectopic Multiple Living   Review of Systems No sore throat No cough No pleuritic pain No wheezing + nasal congestion + post-nasal drainage + sinus pain/pressure No itchy/red eyes No earache No hemoptysis No SOB ? fever, + chills/sweats No nausea No vomiting No abdominal pain No diarrhea No urinary symptoms No skin rash + fatigue + myalgias + headache    Allergies  Review of patient's allergies indicates no known allergies.  Home Medications   Prior to Admission medications   Medication Sig Start Date End Date Taking? Authorizing Provider  ALPRAZolam (XANAX) 0.25 MG tablet Take 1 tablet (0.25 mg total) by mouth at bedtime as needed for sleep.  09/14/15   Bertram Denver, PA-C  Azelastine-Fluticasone [facility]) 137-50 MCG/ACT SUSP Place 2 sprays into both nostrils 1 day or 1 dose. 10/20/15   Cristal Ford, MD  baclofen (LIORESAL) 10 MG tablet Take 1 tablet (10 mg total) by mouth every 8 (eight) hours as needed. 09/14/15   Bertram Denver, PA-C  BOTOX 100 UNITS SOLR injection INJECT 100 UNITS INTO THE MUSCLE ONCE. 06/15/15   Bertram Denver, PA-C  Carbinoxamine Maleate ER [facility] ER) 4 MG/5ML SUER Take 8 mg by mouth 2 (two) times daily. 10/20/15   Cristal Ford, MD  cefdinir (OMNICEF) 300 MG capsule Take 1 capsule (300 mg total) by mouth 2 (two) times daily. 11/25/15   Lattie Haw, MD  celecoxib (CELEBREX) 200 MG capsule Take 1 capsule (200 mg total) by mouth 2 (two) times daily. 12/15/14   Delbert Phenix, NP  cetirizine (ZYRTEC) 10 MG tablet Take 10 mg by mouth daily.    Historical Provider, MD  chlorproMAZINE (THORAZINE) 25 MG tablet Take 1 tablet (25 mg total) by mouth 3 (three) times daily. 09/14/15   Bertram Denver, PA-C  esomeprazole (NEXIUM) 20 MG capsule Take 20 mg by mouth daily at 12 noon.    Historical Provider,  MD  famotidine (PEPCID) 10 MG tablet Take 10 mg by mouth 2 (two) times daily.    Historical Provider, MD  fluticasone (FLONASE) 50 MCG/ACT nasal spray Place 2 sprays into both nostrils daily. 10/21/14   Ramon DredgeEdward Saguier, PA-C  HYDROcodone-acetaminophen (NORCO) 7.5-325 MG tablet Take 1 tablet by mouth every 6 (six) hours as needed. 09/14/15   Bertram DenverKaren E Teague Clark, PA-C  hyoscyamine (LEVSIN/SL) 0.125 MG SL tablet Place 1 tablet (0.125 mg total) under the tongue every 4 (four) hours as needed. 04/16/15   Lelon PerlaYvonne R Lowne, DO  metoprolol succinate (TOPROL-XL) 50 MG 24 hr tablet Take 1 tablet (50 mg total) by mouth daily. 06/15/15   Bertram DenverKaren E Teague Clark, PA-C  naratriptan (AMERGE) 2.5 MG tablet Take 1 tablet (2.5 mg total) by mouth as needed. Take one (1) tablet at onset of headache; if returns or does  not resolve, may repeat after 4 hours; do not exceed five (5) mg in 24 hours. 09/14/15   Duane BostonKaren E Teague Clark, PA-C  ondansetron (ZOFRAN) 8 MG tablet Take 1 tablet (8 mg total) by mouth every 8 (eight) hours as needed. 09/14/15   Bertram DenverKaren E Teague Clark, PA-C  predniSONE (DELTASONE) 50 MG tablet Take one tab by mouth with food once daily for five days 11/25/15   Lattie HawStephen A Cyprian Gongaware, MD  sertraline (ZOLOFT) 100 MG tablet Take 1 tablet (100 mg total) by mouth daily. 12/15/14   Delbert PhenixLinda M Barefoot, NP  SUMAtriptan (IMITREX) 100 MG tablet Take 1 tablet (100 mg total) by mouth once as needed for migraine. 09/14/15   Bertram DenverKaren E Teague Clark, PA-C  tiZANidine (ZANAFLEX) 4 MG tablet Take 1 tablet (4 mg total) by mouth 3 (three) times daily. 09/14/15   Bertram DenverKaren E Teague Clark, PA-C  topiramate (TOPAMAX) 50 MG tablet Take 1 tablet (50 mg total) by mouth 3 (three) times daily. 09/14/15   Bertram DenverKaren E Teague Clark, PA-C  traZODone (DESYREL) 50 MG tablet TAKE 1/2 TO 1 TABLET BY MOUTH AT BEDTIME AS NEEDED FOR SLEEP. 06/29/15   Lelon PerlaYvonne R Lowne, DO   Meds Ordered and Administered this Visit  Medications - No data to display  BP 138/87 mmHg  Pulse 78  Temp(Src) 97.5 F (36.4 C) (Oral)  Wt 175 lb (79.379 kg)  SpO2 97% No data found.   Physical Exam Nursing notes and Vital Signs reviewed. Appearance:  Patient appears stated age, and in no acute distress Eyes:  Pupils are equal, round, and reactive to light and accomodation.  Extraocular movement is intact.  Conjunctivae are not inflamed  Ears:  Canals normal.  Tympanic membranes normal.  Nose:  Mildly congested turbinates.   Both frontal and bilateral maxillary sinus tenderness is present.  Pharynx:  Normal Neck:  Supple.  No adenopathy    ED Course  Procedures  None  Imaging Review Dg Sinuses Complete  11/25/2015  CLINICAL DATA:  Sinus pain and congestion for 1 week, primarily over the maxillary regions EXAM: PARANASAL SINUSES - COMPLETE 3 + VIEW COMPARISON:  None. FINDINGS:  Water's, frontal, lateral, and Caldwell views obtained. Paranasal sinuses are clear. There is no air-fluid level. No bony destruction or expansion. Mastoids appear clear bilaterally. There is slight rightward deviation of the nasal septum. IMPRESSION: Paranasal sinuses and mastoids clear. Slight rightward deviation of the nasal septum. Electronically Signed   By: Bretta BangWilliam  Woodruff III M.D.   On: 11/25/2015 15:05      MDM   1. Acute maxillary sinusitis, recurrence not specified  Begin prednisone burst, and Omnicef  BID for 10 days. Take plain guaifenesin (  extended release tabs such as Mucinex) twice daily, with plenty of water, for cough and congestion.  May add Pseudoephedrine ( , one or two every 4 to 6 hours) for sinus congestion.  Get adequate rest.    Also recommend using saline nasal spray several times daily and saline nasal irrigation (AYR is a common brand).   Continue Dymista. Followup with Family Doctor if not improved in 10 days.     Lattie Haw, MD 11/25/15 [phone]

## 2015-11-25 NOTE — ED Notes (Signed)
Pt c/o 1 week of sinus congestion and pain that is worsening more recently, fatigue and seats. . Thick, green bloody mucous. Using neti pot.

## 2015-11-25 NOTE — Discharge Instructions (Signed)
Take plain guaifenesin (1200mg  extended release tabs such as Mucinex) twice daily, with plenty of water, for cough and congestion.  May add Pseudoephedrine (30mg , one or two every 4 to 6 hours) for sinus congestion.  Get adequate rest.    Also recommend using saline nasal spray several times daily and saline nasal irrigation (AYR is a common brand).   Continue Dymista.

## 2015-12-13 MED FILL — BOTOX 100 UNITS VIAL: 100 | 90 days supply | Qty: 1 | Fill #1

## 2015-12-14 ENCOUNTER — Ambulatory Visit (INDEPENDENT_AMBULATORY_CARE_PROVIDER_SITE_OTHER): Payer: 59 | Admitting: Physician Assistant

## 2015-12-14 ENCOUNTER — Encounter: Payer: Self-pay | Admitting: Physician Assistant

## 2015-12-14 VITALS — BP 137/87 | HR 71 | Resp 16 | Ht 66.0 in | Wt 172.0 lb

## 2015-12-14 DIAGNOSIS — G43009 Migraine without aura, not intractable, without status migrainosus: Secondary | ICD-10-CM | POA: Diagnosis not present

## 2015-12-14 DIAGNOSIS — M542 Cervicalgia: Secondary | ICD-10-CM

## 2015-12-14 DIAGNOSIS — F419 Anxiety disorder, unspecified: Secondary | ICD-10-CM

## 2015-12-14 MED ORDER — ONDANSETRON HCL 8 MG PO TABS: 8.0000 mg | ORAL_TABLET | Freq: Three times a day (TID) | ORAL | Status: DC | PRN

## 2015-12-14 MED ORDER — ALPRAZOLAM 0.25 MG PO TABS: 0.2500 mg | ORAL_TABLET | Freq: Every evening | ORAL | Status: DC | PRN

## 2015-12-14 MED ORDER — HYDROCODONE-ACETAMINOPHEN 7.5-325 MG PO TABS: 1.0000 | ORAL_TABLET | Freq: Four times a day (QID) | ORAL | Status: DC | PRN

## 2015-12-14 MED FILL — HYDROCODON-APAP 7.5-325: 7.5-325 | 10 days supply | Qty: 40 | Fill #0

## 2015-12-14 MED FILL — ONDANSETRON HCL 8 MG TABLET: 8 | 6 days supply | Qty: 20 | Fill #0

## 2015-12-14 NOTE — Patient Instructions (Signed)
OnabotulinumtoxinA injection (Cosmetic Use) What is this medicine? ONABOTULINUMTOXINA (o na BOTT you lye num tox in eh) is a neuro-muscular blocker. It is used to treat moderate to severe frown lines on the face. This medicine may be used for other purposes; ask your health care provider or pharmacist if you have questions. What should I tell my health care provider before I take this medicine? They need to know if you have any of these conditions: -breathing problems -cerebral palsy spasms -heart problems -history of surgery where this medicine is going to be used -infection where this medicine is going to be used -myasthenia gravis or other neurologic disease -nerve or muscle disease -surgery plans -thyroid problems -an unusual or allergic reaction to botulinum toxin, albumin, other medicines, foods, dyes, or preservatives -pregnant or trying to get pregnant -breast-feeding How should I use this medicine? This medicine is for injection into a muscle. It is given by a health care professional in a hospital or clinic setting. Talk to your pediatrician regarding the use of this medicine in children. Special care may be needed. Overdosage: If you think you have taken too much of this medicine contact a poison control center or emergency room at once. NOTE: This medicine is only for you. Do not share this medicine with others. What if I miss a dose? This does not apply. What may interact with this medicine? -aminoglycoside antibiotics like gentamicin, neomycin, tobramycin -muscle relaxants -other botulinum toxin injections This list may not describe all possible interactions. Give your health care provider a list of all the medicines, herbs, non-prescription drugs, or dietary supplements you use. Also tell them if you smoke, drink alcohol, or use illegal drugs. Some items may interact with your medicine. What should I watch for while using this medicine? Visit your doctor for regular  check ups. This medicine will cause weakness in the muscle where it is injected. Tell your doctor if you feel unusually weak in other muscles. Get medical help right away if you have problems with breathing, swallowing, or talking. This medicine contains albumin from human blood. It may be possible to pass an infection in this medicine but no cases have been reported. Talk to your doctor about the risks and benefits of this medicine. This medicine can make your muscles weak. And, this medicine can make your eyelids droop or make you see blurry or double. If you have weak muscles or trouble seeing do not drive a car, use machinery, or do other dangerous activities. What side effects may I notice from receiving this medicine? Side effects that you should report to your doctor or health care professional as soon as possible: -allergic reactions like skin rash, itching or hives, swelling of the face, lips, or tongue -breathing problems -changes in vision -eye irritation, pain -infection -numbness -speech problems -swallowing problems Side effects that usually do not require medical attention (report to your doctor or health care professional if they continue or are bothersome): -bruising or pain at site where injected -drooping eyelid -headache -nausea -sensitivity to light -tearing This list may not describe all possible side effects. Call your doctor for medical advice about side effects. You may report side effects to FDA at 1-800-FDA-1088. Where should I keep my medicine? This drug is given in a hospital or clinic and will not be stored at home. NOTE: This sheet is a summary. It may not cover all possible information. If you have questions about this medicine, talk to your doctor, pharmacist, or health care provider.  2016, Elsevier/Gold Standard. (2012-08-26 17:29:42)

## 2015-12-14 NOTE — Progress Notes (Signed)
Patient ID: [name], female   DOB: August 05, 1961, 55 y.o.   MRN: [id number] S: Pt in office today for Botox injections. Her Botox has been working very well for migraine prevention in terms of reducing severity and frequency.  Last month she did have a sinus infection that required a steroid taper.  During that week-10 days, her migraines were unrelenting.   She is using daily medications for prevention as well as the Botox every 3 months.       O: BP 137/87 mmHg  Pulse 71  Resp 16  Ht 5\' 6"  (1.676 m)  Wt 172 lb (78.019 kg)  BMI 27.77 kg/m2  In the prior 28 days, she has had 7 severe days, 7 moderate days, 7 mild days and 7 days with no headache.   Her HIT6 score is 60.    General: Well developed, well nourished, in no acute distress.  Botox Procedure Note Vial of Botox was : Supplied by Patient Lot # [id number] Expiration Date Dec 2018   Botox Dosing by Muscle Group for Chronic Migraine   Injection Sites for Migraines  Botox 100 units was injected using the dosage in the table above in the pattern and ratio shown above.  A: Chronic Migraine  Muscle spasm   P: Botox 100 units injected today.  Refills provided for metoprolol for prevention, xanax, and hydrocodone for rescue.   RTC 3 Months.

## 2015-12-21 MED FILL — METOPROLOL SUCC ER 50 MG TA: 50 | 90 days supply | Qty: 90 | Fill #1

## 2015-12-22 ENCOUNTER — Other Ambulatory Visit: Payer: Self-pay | Admitting: *Deleted

## 2015-12-22 DIAGNOSIS — F419 Anxiety disorder, unspecified: Secondary | ICD-10-CM

## 2015-12-22 MED ORDER — SERTRALINE HCL 100 MG PO TABS: 100.0000 mg | ORAL_TABLET | Freq: Every day | ORAL | Status: DC

## 2015-12-22 NOTE — Telephone Encounter (Signed)
RF request given for Zoloft 100 mg with 5 RF's

## 2016-01-03 MED FILL — TOPIRAMATE 50 MG TABLET: 50 | 30 days supply | Qty: 90 | Fill #2

## 2016-01-03 MED FILL — tiZANidine HCL 4 MG TABS: 4 | 30 days supply | Qty: 90 | Fill #1

## 2016-01-03 MED FILL — ALPRAZolam 0.25 MG TABS: 0.25 | 30 days supply | Qty: 30 | Fill #1

## 2016-01-03 MED FILL — SERTRALINE HCL 100 MG TAB: 100 | 30 days supply | Qty: 30 | Fill #0

## 2016-01-03 MED FILL — BACLOFEN 10 MG TABLET: 10 | 20 days supply | Qty: 60 | Fill #1

## 2016-01-27 ENCOUNTER — Other Ambulatory Visit: Payer: Self-pay | Admitting: Family Medicine

## 2016-01-27 ENCOUNTER — Other Ambulatory Visit: Payer: Self-pay | Admitting: *Deleted

## 2016-01-27 DIAGNOSIS — M542 Cervicalgia: Secondary | ICD-10-CM

## 2016-01-27 DIAGNOSIS — G4489 Other headache syndrome: Secondary | ICD-10-CM

## 2016-01-27 MED ORDER — CELECOXIB 200 MG PO CAPS: 200.0000 mg | ORAL_CAPSULE | Freq: Two times a day (BID) | ORAL | Status: DC

## 2016-01-27 MED FILL — traZODone HCL 50 MG TABS: 50 | 30 days supply | Qty: 30 | Fill #0

## 2016-01-27 MED FILL — NARATRIPTAN HCL 2.5 MG TAB: 2.5 | 30 days supply | Qty: 9 | Fill #1

## 2016-01-27 MED FILL — CELECOXIB 200 MG CAPSULE: 200 | 30 days supply | Qty: 60 | Fill #0

## 2016-01-27 MED FILL — SUMATRIPTAN SUCC 100 MG TAB: 100 | 30 days supply | Qty: 9 | Fill #2

## 2016-01-27 NOTE — Telephone Encounter (Signed)
Last seen 04/16/15 and filled 06/29/15 #30 with 2 Rf   Please advise    KP

## 2016-01-27 NOTE — Telephone Encounter (Signed)
RF request for Celexa received from [facility].  Refill granted this time.  Pt will be seeing Nada Maclachlan, PA in [facility] office.

## 2016-01-31 MED FILL — ALPRAZolam 0.25 MG TABS: 0.25 | 30 days supply | Qty: 30 | Fill #2

## 2016-02-02 ENCOUNTER — Ambulatory Visit (INDEPENDENT_AMBULATORY_CARE_PROVIDER_SITE_OTHER): Payer: 59 | Admitting: Family Medicine

## 2016-02-02 ENCOUNTER — Encounter: Payer: Self-pay | Admitting: Family Medicine

## 2016-02-02 VITALS — BP 110/68 | HR 60 | Ht 65.0 in | Wt 173.0 lb

## 2016-02-02 DIAGNOSIS — M722 Plantar fascial fibromatosis: Secondary | ICD-10-CM

## 2016-02-02 MED FILL — SERTRALINE HCL 100 MG TAB: 100 | 30 days supply | Qty: 30 | Fill #1

## 2016-02-02 NOTE — Assessment & Plan Note (Signed)
Plan to return for orthotics. In the meantime will use arch straps, eccentric heel exercises ice massage.

## 2016-02-02 NOTE — Progress Notes (Signed)
[name] is a 55 y.o. female who presents to [facility] today for heel pain. Patient notes a several month history of right heel pain. She suspects plantar fasciitis and has been treating this at home with ice massage avoiding barefoot walking stretching and exercises. This is only mildly helpful. She notes pain when she first gets out of bed the morning. She has pain during her workday. She has a trip planned for [place] in June and is worried about having significant pain with the excessive walking she'll be doing. Overall symptoms are mild to moderate.   Past Medical History  Diagnosis Date  . Migraines   . Infertility, female    Past Surgical History  Procedure Laterality Date  . Shoulder surgery      Left  . Ankle surgery      Left   Social History  Substance Use Topics  . Smoking status: Former Smoker    Types: Cigarettes    Quit date: 02/09/1992  . Smokeless tobacco: Never Used  . Alcohol Use: Yes     Comment: 1-2 weekly   family history includes Allergic rhinitis in her mother; Cancer in her maternal grandmother; Eczema in her daughter; Hypertension in her mother.  ROS:  No headache, visual changes, nausea, vomiting, diarrhea, constipation, dizziness, abdominal pain, skin rash, fevers, chills, night sweats, weight loss, swollen lymph nodes, body aches, joint swelling, muscle aches, chest pain, shortness of breath, mood changes, visual or auditory hallucinations.    Medications: Current Outpatient Prescriptions  Medication Sig Dispense Refill  . ALPRAZolam (XANAX) 0.25 MG tablet Take 1 tablet (0.25 mg total) by mouth at bedtime as needed for sleep. 30 tablet 2  . Azelastine-Fluticasone (DYMISTA) 137-50 MCG/ACT SUSP Place 2 sprays into both nostrils 1 day or 1 dose. 1 Bottle 5  . baclofen (LIORESAL) 10 MG tablet Take 1 tablet (10 mg total) by mouth every 8 (eight) hours as needed. 60 each 3  . BOTOX 100 UNITS SOLR  injection INJECT 100 UNITS INTO THE MUSCLE ONCE. 1 vial 3  . Carbinoxamine Maleate ER [facility] ER) 4 MG/5ML SUER Take 8 mg by mouth 2 (two) times daily. 480 mL 5  . celecoxib (CELEBREX) 200 MG capsule Take 1 capsule (200 mg total) by mouth 2 (two) times daily. 60 capsule 3  . cetirizine (ZYRTEC) 10 MG tablet Take 10 mg by mouth daily.    . chlorproMAZINE (THORAZINE) 25 MG tablet Take 1 tablet (25 mg total) by mouth 3 (three) times daily. 30 tablet 0  . esomeprazole (NEXIUM) 20 MG capsule Take 20 mg by mouth daily at 12 noon.    . famotidine (PEPCID) 10 MG tablet Take 10 mg by mouth 2 (two) times daily.    . fluticasone (FLONASE) 50 MCG/ACT nasal spray Place 2 sprays into both nostrils daily. 16 g 1  . HYDROcodone-acetaminophen (NORCO) 7.5-325 MG tablet Take 1 tablet by mouth every 6 (six) hours as needed. 40 tablet 0  . hyoscyamine (LEVSIN/SL) 0.125 MG SL tablet Place 1 tablet (0.125 mg total) under the tongue every 4 (four) hours as needed. 30 tablet 0  . metoprolol succinate (TOPROL-XL) 50 MG 24 hr tablet Take 1 tablet (50 mg total) by mouth daily. 30 tablet 11  . naratriptan (AMERGE) 2.5 MG tablet Take 1 tablet (2.5 mg total) by mouth as needed. Take one (1) tablet at onset of headache; if returns or does not resolve, may repeat after 4 hours; do not exceed  five (5) mg in 24 hours. 10 tablet 11  . ondansetron (ZOFRAN) 8 MG tablet Take 1 tablet (8 mg total) by mouth every 8 (eight) hours as needed. 20 tablet 3  . sertraline (ZOLOFT) 100 MG tablet Take 1 tablet (100 mg total) by mouth daily. 30 tablet 5  . SUMAtriptan (IMITREX) 100 MG tablet Take 1 tablet (100 mg total) by mouth once as needed for migraine. 9 tablet 11  . tiZANidine (ZANAFLEX) 4 MG tablet Take 1 tablet (4 mg total) by mouth 3 (three) times daily. 90 tablet 2  . topiramate (TOPAMAX) 50 MG tablet Take 1 tablet (50 mg total) by mouth 3 (three) times daily. 90 tablet 11  . traZODone (DESYREL) 50 MG tablet TAKE 1/2 TO 1 TABLET BY MOUTH  AT BEDTIME AS NEEDED FOR SLEEP. 30 tablet 2   No current facility-administered medications for this visit.   No Known Allergies   Exam:  BP 110/68 mmHg  Pulse 60  Ht  (1.651 m)  Wt 173 lb (78.472 kg)  BMI 28.79 kg/m2 General: Well Developed, well nourished, and in no acute distress.  Neuro/Psych: Alert and oriented x3, extra-ocular muscles intact, able to move all 4 extremities, sensation grossly intact. Skin: Warm and dry, no rashes noted.  Respiratory: Not using accessory muscles, speaking in full sentences, trachea midline.  Cardiovascular: Pulses palpable, no extremity edema. Abdomen: Does not appear distended. MSK: Feet are normal appearing bilaterally with no significant breakdown. The right plantar calcaneus foot is tender at the medial aspect. Normal gait. No leg length discrepancy.   No results found for this or any previous visit (from the past 24 hour(s)). No results found.   Please see individual assessment and plan sections.

## 2016-02-02 NOTE — Patient Instructions (Signed)
Thank you for coming in today. Return for Orthotics.  Do the heel exercises.  Do the ice massage.  Get Arch Straps.   Plantar Fasciitis With Rehab The plantar fascia is a fibrous, ligament-like, soft-tissue structure that spans the bottom of the foot. Plantar fasciitis, also called heel spur syndrome, is a condition that causes pain in the foot due to inflammation of the tissue. SYMPTOMS   Pain and tenderness on the underneath side of the foot.  Pain that worsens with standing or walking. CAUSES  Plantar fasciitis is caused by irritation and injury to the plantar fascia on the underneath side of the foot. Common mechanisms of injury include:  Direct trauma to bottom of the foot.  Damage to a small nerve that runs under the foot where the main fascia attaches to the heel bone.  Stress placed on the plantar fascia due to bone spurs. RISK INCREASES WITH:   Activities that place stress on the plantar fascia (running, jumping, pivoting, or cutting).  Poor strength and flexibility.  Improperly fitted shoes.  Tight calf muscles.  Flat feet.  Failure to warm-up properly before activity.  Obesity. PREVENTION  Warm up and stretch properly before activity.  Allow for adequate recovery between workouts.  Maintain physical fitness:  Strength, flexibility, and endurance.  Cardiovascular fitness.  Maintain a health body weight.  Avoid stress on the plantar fascia.  Wear properly fitted shoes, including arch supports for individuals who have flat feet. PROGNOSIS  If treated properly, then the symptoms of plantar fasciitis usually resolve without surgery. However, occasionally surgery is necessary. RELATED COMPLICATIONS   Recurrent symptoms that may result in a chronic condition.  Problems of the lower back that are caused by compensating for the injury, such as limping.  Pain or weakness of the foot during push-off following surgery.  Chronic inflammation, scarring, and  partial or complete fascia tear, occurring more often from repeated injections. TREATMENT  Treatment initially involves the use of ice and medication to help reduce pain and inflammation. The use of strengthening and stretching exercises may help reduce pain with activity, especially stretches of the Achilles tendon. These exercises may be performed at home or with a therapist. Your caregiver may recommend that you use heel cups of arch supports to help reduce stress on the plantar fascia. Occasionally, corticosteroid injections are given to reduce inflammation. If symptoms persist for greater than 6 months despite non-surgical (conservative), then surgery may be recommended.  MEDICATION   If pain medication is necessary, then nonsteroidal anti-inflammatory medications, such as aspirin and ibuprofen, or other minor pain relievers, such as acetaminophen, are often recommended.  Do not take pain medication within 7 days before surgery.  Prescription pain relievers may be given if deemed necessary by your caregiver. Use only as directed and only as much as you need.  Corticosteroid injections may be given by your caregiver. These injections should be reserved for the most serious cases, because they may only be given a certain number of times. HEAT AND COLD  Cold treatment (icing) relieves pain and reduces inflammation. Cold treatment should be applied for 10 to 15 minutes every 2 to 3 hours for inflammation and pain and immediately after any activity that aggravates your symptoms. Use ice packs or massage the area with a piece of ice (ice massage).  Heat treatment may be used prior to performing the stretching and strengthening activities prescribed by your caregiver, physical therapist, or athletic trainer. Use a heat pack or soak the injury in  warm water. SEEK IMMEDIATE MEDICAL CARE IF:  Treatment seems to offer no benefit, or the condition worsens.  Any medications produce adverse side  effects. EXERCISES RANGE OF MOTION (ROM) AND STRETCHING EXERCISES - Plantar Fasciitis (Heel Spur Syndrome) These exercises may help you when beginning to rehabilitate your injury. Your symptoms may resolve with or without further involvement from your physician, physical therapist or athletic trainer. While completing these exercises, remember:   Restoring tissue flexibility helps normal motion to return to the joints. This allows healthier, less painful movement and activity.  An effective stretch should be held for at least 30 seconds.  A stretch should never be painful. You should only feel a gentle lengthening or release in the stretched tissue. RANGE OF MOTION - Toe Extension, Flexion  Sit with your right / left leg crossed over your opposite knee.  Grasp your toes and gently pull them back toward the top of your foot. You should feel a stretch on the bottom of your toes and/or foot.  Hold this stretch for __________ seconds.  Now, gently pull your toes toward the bottom of your foot. You should feel a stretch on the top of your toes and or foot.  Hold this stretch for __________ seconds. Repeat __________ times. Complete this stretch __________ times per day.  RANGE OF MOTION - Ankle Dorsiflexion, Active Assisted  Remove shoes and sit on a chair that is preferably not on a carpeted surface.  Place right / left foot under knee. Extend your opposite leg for support.  Keeping your heel down, slide your right / left foot back toward the chair until you feel a stretch at your ankle or calf. If you do not feel a stretch, slide your bottom forward to the edge of the chair, while still keeping your heel down.  Hold this stretch for __________ seconds. Repeat __________ times. Complete this stretch __________ times per day.  STRETCH - Gastroc, Standing  Place hands on wall.  Extend right / left leg, keeping the front knee somewhat bent.  Slightly point your toes inward on your back  foot.  Keeping your right / left heel on the floor and your knee straight, shift your weight toward the wall, not allowing your back to arch.  You should feel a gentle stretch in the right / left calf. Hold this position for __________ seconds. Repeat __________ times. Complete this stretch __________ times per day. STRETCH - Soleus, Standing  Place hands on wall.  Extend right / left leg, keeping the other knee somewhat bent.  Slightly point your toes inward on your back foot.  Keep your right / left heel on the floor, bend your back knee, and slightly shift your weight over the back leg so that you feel a gentle stretch deep in your back calf.  Hold this position for __________ seconds. Repeat __________ times. Complete this stretch __________ times per day. STRETCH - Gastrocsoleus, Standing  Note: This exercise can place a lot of stress on your foot and ankle. Please complete this exercise only if specifically instructed by your caregiver.   Place the ball of your right / left foot on a step, keeping your other foot firmly on the same step.  Hold on to the wall or a rail for balance.  Slowly lift your other foot, allowing your body weight to press your heel down over the edge of the step.  You should feel a stretch in your right / left calf.  Hold this position  for __________ seconds.  Repeat this exercise with a slight bend in your right / left knee. Repeat __________ times. Complete this stretch __________ times per day.  STRENGTHENING EXERCISES - Plantar Fasciitis (Heel Spur Syndrome)  These exercises may help you when beginning to rehabilitate your injury. They may resolve your symptoms with or without further involvement from your physician, physical therapist or athletic trainer. While completing these exercises, remember:   Muscles can gain both the endurance and the strength needed for everyday activities through controlled exercises.  Complete these exercises as  instructed by your physician, physical therapist or athletic trainer. Progress the resistance and repetitions only as guided. STRENGTH - Towel Curls  Sit in a chair positioned on a non-carpeted surface.  Place your foot on a towel, keeping your heel on the floor.  Pull the towel toward your heel by only curling your toes. Keep your heel on the floor.  If instructed by your physician, physical therapist or athletic trainer, add ____________________ at the end of the towel. Repeat __________ times. Complete this exercise __________ times per day. STRENGTH - Ankle Inversion  Secure one end of a rubber exercise band/tubing to a fixed object (table, pole). Loop the other end around your foot just before your toes.  Place your fists between your knees. This will focus your strengthening at your ankle.  Slowly, pull your big toe up and in, making sure the band/tubing is positioned to resist the entire motion.  Hold this position for __________ seconds.  Have your muscles resist the band/tubing as it slowly pulls your foot back to the starting position. Repeat __________ times. Complete this exercises __________ times per day.    This information is not intended to replace advice given to you by your health care provider. Make sure you discuss any questions you have with your health care provider.   Document Released: 11/27/2005 Document Revised: 04/13/2015 Document Reviewed: 03/11/2009 Elsevier Interactive Patient Education [employer]

## 2016-02-17 MED FILL — SULFAMETHOXAZOLE/TMP DS TAB: 800-160 | 15 days supply | Qty: 30 | Fill #0

## 2016-02-25 MED FILL — TOPIRAMATE 50 MG TABLET: 50 | 30 days supply | Qty: 90 | Fill #3

## 2016-03-01 MED FILL — NARATRIPTAN HCL 2.5 MG TAB: 2.5 | 30 days supply | Qty: 9 | Fill #2

## 2016-03-02 MED FILL — SERTRALINE HCL 100 MG TAB: 100 | 30 days supply | Qty: 30 | Fill #2

## 2016-03-16 MED FILL — BOTOX 100 UNITS VIAL: 100 | 30 days supply | Qty: 1 | Fill #2

## 2016-03-17 ENCOUNTER — Ambulatory Visit (INDEPENDENT_AMBULATORY_CARE_PROVIDER_SITE_OTHER): Payer: 59 | Admitting: Physician Assistant

## 2016-03-17 ENCOUNTER — Encounter: Payer: Self-pay | Admitting: Physician Assistant

## 2016-03-17 VITALS — BP 93/60 | HR 60 | Resp 16 | Ht 64.0 in | Wt 172.0 lb

## 2016-03-17 DIAGNOSIS — G4489 Other headache syndrome: Secondary | ICD-10-CM

## 2016-03-17 DIAGNOSIS — M542 Cervicalgia: Secondary | ICD-10-CM

## 2016-03-17 DIAGNOSIS — M62838 Other muscle spasm: Secondary | ICD-10-CM

## 2016-03-17 DIAGNOSIS — G43009 Migraine without aura, not intractable, without status migrainosus: Secondary | ICD-10-CM | POA: Diagnosis not present

## 2016-03-17 DIAGNOSIS — F419 Anxiety disorder, unspecified: Secondary | ICD-10-CM

## 2016-03-17 MED ORDER — ALPRAZOLAM 0.25 MG PO TABS: 0.2500 mg | ORAL_TABLET | Freq: Every evening | ORAL | Status: DC | PRN

## 2016-03-17 MED ORDER — HYDROCODONE-ACETAMINOPHEN 7.5-325 MG PO TABS: 1.0000 | ORAL_TABLET | Freq: Four times a day (QID) | ORAL | Status: DC | PRN

## 2016-03-17 MED ORDER — SERTRALINE HCL 100 MG PO TABS: 100.0000 mg | ORAL_TABLET | Freq: Every day | ORAL | Status: DC

## 2016-03-17 MED ORDER — CELECOXIB 200 MG PO CAPS: 200.0000 mg | ORAL_CAPSULE | Freq: Two times a day (BID) | ORAL | Status: DC

## 2016-03-17 MED ORDER — ONDANSETRON HCL 8 MG PO TABS: 8.0000 mg | ORAL_TABLET | Freq: Three times a day (TID) | ORAL | Status: DC | PRN

## 2016-03-17 MED FILL — CELECOXIB 200 MG CAPSULE: 200 | 30 days supply | Qty: 60 | Fill #0

## 2016-03-17 MED FILL — ALPRAZolam 0.25 MG TABS: 0.25 | 30 days supply | Qty: 30 | Fill #0

## 2016-03-17 MED FILL — HYDROCODON-APAP 7.5-325: 7.5-325 | 10 days supply | Qty: 40 | Fill #0

## 2016-03-17 MED FILL — ONDANSETRON HCL 8 MG TABLET: 8 | 6 days supply | Qty: 20 | Fill #0

## 2016-03-17 NOTE — Progress Notes (Signed)
S: Pt in office today for Botox injections. Her Botox has been effective for migraine prevention. She is using 100 units every 3 months in addition to her oral medications.  This is a good regimen and no change is needed.     O: BP 93/60 mmHg  Pulse 60  Resp 16  Ht 5\' 4"  (1.626 m)  Wt 172 lb (78.019 kg)  BMI 29.51 kg/m2   Botox Procedure Note Vial of Botox was : Supplied by Patient Lot # [id number] Expiration Date 11/2017  Botox Dosing by [employer] for Chronic Migraine   Injection Sites for Migraines  Botox 100 units was injected using the dosage in the table above in the pattern shown above.  A: Migraine  Muscle spasm   P: Botox 100 units injected today.  Medications refilled RTC 3 Months.

## 2016-03-17 NOTE — Patient Instructions (Signed)
Botox Cosmetic Injections Botox Cosmetic (botulinum toxin type A) is a purified, very weak (diluted) form of botulinum toxin. This toxin is produced by a bacteria called Clostridium botulinum. The toxin works by blocking nerve impulses to muscles.  Botox Cosmetic may be used as a non-surgical method to soften facial lines and wrinkles. The toxin weakens the muscle, which reduces the lines of facial expression. It is most effective for:  Forehead lines.  Crow's feet.  Frown lines.  Neck wrinkles. YOU SHOULD NOT RECEIVE BOTOX INJECTIONS IF YOU:   Have myasthenia gravis.  Have allergies to human albumin.  Are pregnant or breastfeeding.  Have taken aspirin or anti-inflammatory medicine in the last 2 weeks.  Have neuromuscular disease.  Have allergies to botulinum toxin.  Have had beer, wine, or any other alcohol in the last week. RISKS AND COMPLICATIONS   Soreness.  Small bruises may develop around the injection sites.  Rarely, drooping of the eyebrow or eyelid can occur. This may last 3 to 6 months but is reversible.  Rarely, double vision develops. This can last 3 to 6 months but is usually reversible.  Rarely, changes in voice or speech, loss of the ability to close the eyes, and difficulty breathing may occur. These side effects can occur as early as several hours after injections, or they may be delayed up to several weeks. BEFORE THE PROCEDURE   Do not drink alcohol for 1 week prior to treatment.  Do not take anti-inflammatory medicines or aspirin for 2 weeks before treatment. Ask your caregiver whether you should stop taking blood thinners (anticoagulants) before treatment.  Ask your caregiver about changing or stopping your regular medicines. PROCEDURE Your caregiver will use a thin needle to inject the Botox into your skin or muscles. This takes about 15 minutes. AFTER THE PROCEDURE   It may take 5 to 14 days before softening of the facial lines in the  treated areas begins.  A second injection is sometimes needed if the first treatment is not entirely successful.  Results last about 4 to 6 months. Injections can be repeated every 4 to 6 months. There are no known long-term side effects.   This information is not intended to replace advice given to you by your health care provider. Make sure you discuss any questions you have with your health care provider.   Document Released: 08/24/2004 Document Revised: 02/19/2012 Document Reviewed: 06/02/2015 Elsevier Interactive Patient Education 2016 Elsevier Inc.  

## 2016-03-23 MED FILL — KARBINAL ER 4 MG/ 5 ML SUSP: 4 | 24 days supply | Qty: 480 | Fill #1

## 2016-03-23 MED FILL — traZODone HCL 50 MG TABS: 50 | 30 days supply | Qty: 30 | Fill #1

## 2016-03-28 MED FILL — METOPROLOL SUCC ER 50 MG TA: 50 | 90 days supply | Qty: 90 | Fill #2

## 2016-03-29 ENCOUNTER — Encounter: Payer: Self-pay | Admitting: Family Medicine

## 2016-03-29 ENCOUNTER — Ambulatory Visit (INDEPENDENT_AMBULATORY_CARE_PROVIDER_SITE_OTHER): Payer: 59 | Admitting: Family Medicine

## 2016-03-29 VITALS — BP 91/59 | HR 61 | Wt 172.0 lb

## 2016-03-29 DIAGNOSIS — M722 Plantar fascial fibromatosis: Secondary | ICD-10-CM

## 2016-03-29 NOTE — Progress Notes (Signed)
    Orthotics Note:   Patient was fitted for a : standard, cushioned, semi-rigid orthotic. The orthotic was heated and afterward the patient stood on the orthotic blank positioned on the orthotic stand. The patient was positioned in subtalar neutral position and 10 degrees of ankle dorsiflexion in a weight bearing stance. After completion of molding, a stable base was applied to the orthotic blank. The blank was ground to a stable position for weight bearing. Size: 8 Base: White EVA Additional Posting and Padding: None The patient ambulated these, and they were very comfortable.  I spent 40 minutes with this patient, greater than 50% was face-to-face time counseling regarding the below diagnosis.   

## 2016-04-03 MED FILL — SERTRALINE HCL 100 MG TAB: 100 | 30 days supply | Qty: 30 | Fill #3

## 2016-04-18 MED FILL — TOPIRAMATE 50 MG TABLET: 50 | 30 days supply | Qty: 90 | Fill #4

## 2016-04-18 MED FILL — SUMATRIPTAN SUCC 100 MG TAB: 100 | 30 days supply | Qty: 9 | Fill #3

## 2016-04-18 MED FILL — ONDANSETRON HCL 8 MG TABLET: 8 | 6 days supply | Qty: 20 | Fill #1

## 2016-04-18 MED FILL — NARATRIPTAN HCL 2.5 MG TAB: 2.5 | 30 days supply | Qty: 9 | Fill #3

## 2016-04-18 MED FILL — tiZANidine HCL 4 MG TABS: 4 | 30 days supply | Qty: 90 | Fill #2

## 2016-04-18 MED FILL — traZODone HCL 50 MG TABS: 50 | 30 days supply | Qty: 30 | Fill #2

## 2016-04-18 MED FILL — ALPRAZolam 0.25 MG TABS: 0.25 | 30 days supply | Qty: 30 | Fill #1

## 2016-04-26 ENCOUNTER — Ambulatory Visit (INDEPENDENT_AMBULATORY_CARE_PROVIDER_SITE_OTHER): Payer: 59 | Admitting: Family Medicine

## 2016-04-26 ENCOUNTER — Encounter: Payer: Self-pay | Admitting: Family Medicine

## 2016-04-26 VITALS — BP 128/80 | HR 65 | Wt 171.0 lb

## 2016-04-26 DIAGNOSIS — M722 Plantar fascial fibromatosis: Secondary | ICD-10-CM

## 2016-04-26 NOTE — Patient Instructions (Signed)
Thank you for coming in today. Call or go to the ER if you develop a large red swollen joint with extreme pain or oozing puss.  Return as needed.  Take it easy for the next few days.

## 2016-04-26 NOTE — Progress Notes (Signed)
[name] is a 55 y.o. female who [facility] today for follow-up right heel pain. Patient continues to have bothersome planter fasciitis symptoms despite orthotics and home exercise program. She is interested in injection if possible.   Past Medical History  Diagnosis Date  . Migraines   . Infertility, female    Past Surgical History  Procedure Laterality Date  . Shoulder surgery      Left  . Ankle surgery      Left   Social History  Substance Use Topics  . Smoking status: Former Smoker    Types: Cigarettes    Quit date: 02/09/1992  . Smokeless tobacco: Never Used  . Alcohol Use: Yes     Comment: 1-2 weekly   family history includes Allergic rhinitis in her mother; Cancer in her maternal grandmother; Eczema in her daughter; Hypertension in her mother.  ROS as above Medications: Current Outpatient Prescriptions  Medication Sig Dispense Refill  . ALPRAZolam (XANAX) 0.25 MG tablet Take 1 tablet (0.25 mg total) by mouth at bedtime as needed for sleep. 30 tablet 2  . Azelastine-Fluticasone (DYMISTA) 137-50 MCG/ACT SUSP Place 2 sprays into both nostrils 1 day or 1 dose. 1 Bottle 5  . baclofen (LIORESAL) 10 MG tablet Take 1 tablet (10 mg total) by mouth every 8 (eight) hours as needed. 60 each 3  . BOTOX 100 UNITS SOLR injection INJECT 100 UNITS INTO THE MUSCLE ONCE. 1 vial 3  . Carbinoxamine Maleate ER [facility](KARBINAL ER) 4 MG/5ML SUER Take 8 mg by mouth 2 (two) times daily. 480 mL 5  . celecoxib (CELEBREX) 200 MG capsule Take 1 capsule (200 mg total) by mouth 2 (two) times daily. 60 capsule 3  . cetirizine (ZYRTEC) 10 MG tablet Take 10 mg by mouth daily.    . chlorproMAZINE (THORAZINE) 25 MG tablet Take 1 tablet (25 mg total) by mouth 3 (three) times daily. 30 tablet 0  . esomeprazole (NEXIUM) 20 MG capsule Take 20 mg by mouth daily at 12 noon.    . famotidine  (PEPCID) 10 MG tablet Take 10 mg by mouth 2 (two) times daily.    . fluticasone (FLONASE) 50 MCG/ACT nasal spray Place 2 sprays into both nostrils daily. 16 g 1  . HYDROcodone-acetaminophen (NORCO) 7.5-325 MG tablet Take 1 tablet by mouth every 6 (six) hours as needed. 40 tablet 0  . hyoscyamine (LEVSIN/SL) 0.125 MG SL tablet Place 1 tablet (0.125 mg total) under the tongue every 4 (four) hours as needed. 30 tablet 0  . metoprolol succinate (TOPROL-XL) 50 MG 24 hr tablet Take 1 tablet (50 mg total) by mouth daily. 30 tablet 11  . naratriptan (AMERGE) 2.5 MG tablet Take 1 tablet (2.5 mg total) by mouth as needed. Take one (1) tablet at onset of headache; if returns or does not resolve, may repeat after 4 hours; do not exceed five (5) mg in 24 hours. 10 tablet 11  . ondansetron (ZOFRAN) 8 MG tablet Take 1 tablet (8 mg total) by mouth every 8 (eight) hours as needed. 20 tablet 3  . sertraline (ZOLOFT) 100 MG tablet Take 1 tablet (100 mg total) by mouth daily. 30 tablet 5  . sulfamethoxazole-trimethoprim (BACTRIM DS,SEPTRA DS) 800-160 MG tablet   3  . SUMAtriptan (IMITREX) 100 MG tablet Take 1 tablet (100 mg total) by mouth once as needed for migraine. 9 tablet 11  . tiZANidine (ZANAFLEX) 4 MG tablet Take 1  tablet (4 mg total) by mouth 3 (three) times daily. 90 tablet 2  . topiramate (TOPAMAX) 50 MG tablet Take 1 tablet (50 mg total) by mouth 3 (three) times daily. 90 tablet 11  . traZODone (DESYREL) 50 MG tablet TAKE 1/2 TO 1 TABLET BY MOUTH AT BEDTIME AS NEEDED FOR SLEEP. 30 tablet 2   No current facility-administered medications for this visit.   No Known Allergies   Exam:  BP 128/80 mmHg  Pulse 65  Wt 171 lb (77.565 kg) Gen: Well NAD Right foot normal appearing. Mildly tender to palpation plantar calcaneus.  Procedure: Real-time Ultrasound Guided Injection of right plantar fascia  Device: GE Logiq E  Images permanently stored and available for review in the ultrasound unit. Verbal  informed consent obtained. Discussed risks and benefits of procedure. Warned about infection bleeding damage to structures skin hypopigmentation and fat atrophy among others. Patient expresses understanding and agreement Time-out conducted.  Noted no overlying erythema, induration, or other signs of local infection.  Skin prepped in a sterile fashion.  Local anesthesia: Topical Ethyl chloride.  With sterile technique and under real time ultrasound guidance: 40 mg of Kenalog and 2 mL of Marcaine injected easily.  Completed without difficulty  Pain immediately improved suggesting accurate placement of the medication.  Advised to call if fevers/chills, erythema, induration, drainage, or persistent bleeding.  Images permanently stored and available for review in the ultrasound unit.  Impression: Technically successful ultrasound guided injection.    No results found for this or any previous visit (from the past 24 hour(s)). No results found.  \ 55 year old woman with right plantar fasciitis status post injection. Continue home exercise program and orthotics. Return as needed.

## 2016-05-05 MED FILL — SERTRALINE HCL 100 MG TAB: 100 | 30 days supply | Qty: 30 | Fill #4

## 2016-05-24 ENCOUNTER — Other Ambulatory Visit: Payer: Self-pay | Admitting: Family Medicine

## 2016-05-24 MED FILL — BACLOFEN 10 MG TABLET: 10 | 20 days supply | Qty: 60 | Fill #2

## 2016-05-24 MED FILL — TOPIRAMATE 50 MG TABLET: 50 | 30 days supply | Qty: 90 | Fill #5

## 2016-05-24 MED FILL — ONDANSETRON HCL 8 MG TABLET: 8 | 6 days supply | Qty: 20 | Fill #2

## 2016-05-24 MED FILL — ALPRAZolam 0.25 MG TABS: 0.25 | 30 days supply | Qty: 30 | Fill #2

## 2016-05-24 NOTE — Telephone Encounter (Signed)
Please advise Trazadone request? Pt last seen by PCP 04/16/15 and has no future appts scheduled. Please advise refill and next appt?

## 2016-05-25 MED FILL — CELECOXIB 200 MG CAPSULE: 200 | 30 days supply | Qty: 60 | Fill #1

## 2016-05-25 NOTE — Telephone Encounter (Signed)
Pt trazadone request denied. She has not been seen since 04/16/15 and needs appointment with Dr Laury AxonLowne before refills can be given. Please call pt to schedule appt soon. Thanks!

## 2016-05-26 NOTE — Telephone Encounter (Signed)
Appointment scheduled.

## 2016-06-08 MED FILL — SERTRALINE HCL 100 MG TAB: 100 | 30 days supply | Qty: 30 | Fill #5

## 2016-06-09 MED FILL — SULFAMETHOXAZOLE-TMP DS TAB: 800-160 | 15 days supply | Qty: 30 | Fill #1

## 2016-06-16 ENCOUNTER — Ambulatory Visit (INDEPENDENT_AMBULATORY_CARE_PROVIDER_SITE_OTHER): Payer: 59 | Admitting: Physician Assistant

## 2016-06-16 ENCOUNTER — Encounter: Payer: Self-pay | Admitting: Physician Assistant

## 2016-06-16 VITALS — BP 131/89 | HR 59 | Resp 16 | Ht 64.0 in | Wt 170.0 lb

## 2016-06-16 DIAGNOSIS — G43009 Migraine without aura, not intractable, without status migrainosus: Secondary | ICD-10-CM

## 2016-06-16 DIAGNOSIS — M62838 Other muscle spasm: Secondary | ICD-10-CM | POA: Diagnosis not present

## 2016-06-16 DIAGNOSIS — M542 Cervicalgia: Secondary | ICD-10-CM | POA: Diagnosis not present

## 2016-06-16 MED ORDER — BOTOX 100 UNITS IJ SOLR: INTRAMUSCULAR | Status: DC

## 2016-06-16 MED ORDER — TIZANIDINE HCL 4 MG PO TABS: 4.0000 mg | ORAL_TABLET | Freq: Three times a day (TID) | ORAL | Status: DC

## 2016-06-16 MED ORDER — HYDROCODONE-ACETAMINOPHEN 7.5-325 MG PO TABS: 1.0000 | ORAL_TABLET | Freq: Four times a day (QID) | ORAL | Status: DC | PRN

## 2016-06-16 MED ORDER — METOPROLOL SUCCINATE ER 50 MG PO TB24: 50.0000 mg | ORAL_TABLET | Freq: Every day | ORAL | Status: DC

## 2016-06-16 MED ORDER — BACLOFEN 10 MG PO TABS: 10.0000 mg | ORAL_TABLET | Freq: Three times a day (TID) | ORAL | Status: DC | PRN

## 2016-06-16 MED FILL — tiZANidine HCL 4 MG TABS: 4 | 30 days supply | Qty: 90 | Fill #0

## 2016-06-16 MED FILL — BACLOFEN 10 MG TABLET: 10 | 20 days supply | Qty: 60 | Fill #0

## 2016-06-16 MED FILL — HYDROCODON-APAP 7.5-325: 7.5-325 | 10 days supply | Qty: 40 | Fill #0

## 2016-06-16 NOTE — Patient Instructions (Signed)

## 2016-06-16 NOTE — Progress Notes (Signed)
Patient ID: [name], female   DOB: 11/19/1961, 55 y.o.   MRN: [id number] S: Pt in office today for Botox injections. Her Botox has been working very well for migraine prevention. She is using 100 units every 3 months for migraine prevention O: BP 131/89 mmHg  Pulse 59  Resp 16  Ht 5\' 4"  (1.626 m)  Wt 170 lb (77.111 kg)  BMI 29.17 kg/m2   Botox Procedure Note Vial of Botox was :  Supplied by Office to be replaced by patient within 30 days Lot # [id number] Expiration Date Sept 2019   Botox Dosing by [employer] for Chronic Migraine   Injection Sites for Migraines  Botox 100 units was injected using the dosage in the table above in the pattern shown above.  A: Migraine  Muscle spasm   P: Botox 100 units injected today.  Meds refilled.   RTC 3 Months.   *Pt called and asked that Rx be rewritten to reflect that Botox is 30 day supply.  Pharmacy indicates there is long standing record of pt receiving every 90 days and cannot be changed unless pt were to require the injections every 30 days.  No change to rx given.

## 2016-06-28 ENCOUNTER — Other Ambulatory Visit: Payer: Self-pay | Admitting: Physician Assistant

## 2016-06-28 DIAGNOSIS — F419 Anxiety disorder, unspecified: Secondary | ICD-10-CM

## 2016-06-28 MED FILL — TOPIRAMATE 50 MG TABLET: 50 | 30 days supply | Qty: 90 | Fill #6

## 2016-06-28 MED FILL — CELECOXIB 200 MG CAPSULE: 200 | 30 days supply | Qty: 60 | Fill #2

## 2016-06-28 MED FILL — METOPROLOL SUCC ER 50 MG TA: 50 | 30 days supply | Qty: 30 | Fill #0

## 2016-06-29 MED FILL — BOTOX 100 UNITS VIAL: 100 | 84 days supply | Qty: 1 | Fill #0

## 2016-07-03 MED FILL — SERTRALINE HCL 100 MG TAB: 100 | 30 days supply | Qty: 30 | Fill #0

## 2016-07-03 MED FILL — ALPRAZolam 0.25 MG TABS: 0.25 | 30 days supply | Qty: 30 | Fill #0

## 2016-07-03 NOTE — Telephone Encounter (Signed)
Rx refill for Xanax authorized and phoned in to pharmacy per Nada Maclachlan order.

## 2016-07-27 ENCOUNTER — Other Ambulatory Visit: Payer: Self-pay | Admitting: Physician Assistant

## 2016-07-27 DIAGNOSIS — F419 Anxiety disorder, unspecified: Secondary | ICD-10-CM

## 2016-08-02 ENCOUNTER — Telehealth: Payer: Self-pay | Admitting: *Deleted

## 2016-08-02 DIAGNOSIS — F419 Anxiety disorder, unspecified: Secondary | ICD-10-CM

## 2016-08-02 MED ORDER — ALPRAZOLAM 0.25 MG PO TABS
0.2500 mg | ORAL_TABLET | Freq: Every evening | ORAL | 0 refills | Status: DC | PRN
Start: 2016-08-02 — End: 2016-09-15

## 2016-08-02 MED FILL — NARATRIPTAN HCL 2.5 MG TAB: 2.5 | 30 days supply | Qty: 9 | Fill #4

## 2016-08-02 MED FILL — ALPRAZolam 0.25 MG TABS: 0.25 | 30 days supply | Qty: 30 | Fill #0

## 2016-08-02 MED FILL — SUMATRIPTAN SUCC 100 MG TAB: 100 | 30 days supply | Qty: 9 | Fill #4

## 2016-08-02 NOTE — Telephone Encounter (Signed)
[facility] a refill on Xanax 0.25mg .  Spoke to Nada MaclachlanKaren Teague Clark about refill, approved rx with no additional refills.  Called pt to inform her that medication had been filled and informed her that this would be the only refill that would be approved until her follow-up appt on 09-15-16.  Encouraged to use it on an as needed basis.

## 2016-08-16 MED FILL — SERTRALINE HCL 100 MG TAB: 100 | 30 days supply | Qty: 30 | Fill #1

## 2016-08-16 MED FILL — METOPROLOL SUCC ER 50 MG TA: 50 | 30 days supply | Qty: 30 | Fill #1

## 2016-08-16 MED FILL — TOPIRAMATE 50 MG TABLET: 50 | 30 days supply | Qty: 90 | Fill #7

## 2016-08-16 MED FILL — BACLOFEN 10 MG TABLET: 10 | 20 days supply | Qty: 60 | Fill #1

## 2016-08-16 MED FILL — tiZANidine HCL 4 MG TABS: 4 | 30 days supply | Qty: 90 | Fill #1

## 2016-08-16 MED FILL — ONDANSETRON HCL 8 MG TABLET: 8 | 6 days supply | Qty: 20 | Fill #3

## 2016-08-16 MED FILL — CELECOXIB 200 MG CAPSULE: 200 | 30 days supply | Qty: 60 | Fill #3

## 2016-08-21 ENCOUNTER — Encounter: Payer: Self-pay | Admitting: Family Medicine

## 2016-08-21 ENCOUNTER — Other Ambulatory Visit (HOSPITAL_COMMUNITY)
Admission: RE | Admit: 2016-08-21 | Discharge: 2016-08-21 | Disposition: A | Discharge status: Home / Self Care | Payer: 59 | Source: Ambulatory Visit | Attending: Family Medicine | Admitting: Family Medicine

## 2016-08-21 ENCOUNTER — Ambulatory Visit (INDEPENDENT_AMBULATORY_CARE_PROVIDER_SITE_OTHER): Payer: 59 | Admitting: Family Medicine

## 2016-08-21 VITALS — BP 100/60 | HR 72 | Temp 98.6°F | Resp 16 | Ht 64.0 in | Wt 169.8 lb

## 2016-08-21 DIAGNOSIS — Z1151 Encounter for screening for human papillomavirus (HPV): Secondary | ICD-10-CM | POA: Insufficient documentation

## 2016-08-21 DIAGNOSIS — K219 Gastro-esophageal reflux disease without esophagitis: Secondary | ICD-10-CM

## 2016-08-21 DIAGNOSIS — E2839 Other primary ovarian failure: Secondary | ICD-10-CM

## 2016-08-21 DIAGNOSIS — Z1239 Encounter for other screening for malignant neoplasm of breast: Secondary | ICD-10-CM | POA: Diagnosis not present

## 2016-08-21 DIAGNOSIS — M25552 Pain in left hip: Secondary | ICD-10-CM

## 2016-08-21 DIAGNOSIS — Z0000 Encounter for general adult medical examination without abnormal findings: Secondary | ICD-10-CM | POA: Diagnosis not present

## 2016-08-21 DIAGNOSIS — Z01419 Encounter for gynecological examination (general) (routine) without abnormal findings: Secondary | ICD-10-CM | POA: Insufficient documentation

## 2016-08-21 DIAGNOSIS — Z114 Encounter for screening for human immunodeficiency virus [HIV]: Secondary | ICD-10-CM

## 2016-08-21 DIAGNOSIS — Z1159 Encounter for screening for other viral diseases: Secondary | ICD-10-CM

## 2016-08-21 DIAGNOSIS — G4700 Insomnia, unspecified: Secondary | ICD-10-CM

## 2016-08-21 DIAGNOSIS — Z8669 Personal history of other diseases of the nervous system and sense organs: Secondary | ICD-10-CM

## 2016-08-21 MED ORDER — NONFORMULARY OR COMPOUNDED ITEM: 0 refills | Status: DC

## 2016-08-21 MED ORDER — PANTOPRAZOLE SODIUM 40 MG PO TBEC: 40.0000 mg | DELAYED_RELEASE_TABLET | Freq: Every day | ORAL | 3 refills | Status: DC

## 2016-08-21 MED ORDER — TRAZODONE HCL 50 MG PO TABS: 25.0000 mg | ORAL_TABLET | Freq: Every evening | ORAL | 1 refills | Status: DC | PRN

## 2016-08-21 MED ORDER — NONFORMULARY OR COMPOUNDED ITEM
0 refills | Status: DC
Start: 2016-08-21 — End: 2017-03-05

## 2016-08-21 MED FILL — traZODone HCL 50 MG TABS: 50 | 90 days supply | Qty: 90 | Fill #0

## 2016-08-21 MED FILL — PANTOPRAZOLE SOD DR 40 MG T: 40 | 90 days supply | Qty: 90 | Fill #0

## 2016-08-21 NOTE — Patient Instructions (Signed)
Preventive Care for Adults, Female A healthy lifestyle and preventive care can promote health and wellness. Preventive health guidelines for women include the following key practices.  A routine yearly physical is a good way to check with your health care provider about your health and preventive screening. It is a chance to share any concerns and updates on your health and to receive a thorough exam.  Visit your dentist for a routine exam and preventive care every 6 months. Brush your teeth twice a day and floss once a day. Good oral hygiene prevents tooth decay and gum disease.  The frequency of eye exams is based on your age, health, family medical history, use of contact lenses, and other factors. Follow your health care provider's recommendations for frequency of eye exams.  Eat a healthy diet. Foods like vegetables, fruits, whole grains, low-fat dairy products, and lean protein foods contain the nutrients you need without too many calories. Decrease your intake of foods high in solid fats, added sugars, and salt. Eat the right amount of calories for you.Get information about a proper diet from your health care provider, if necessary.  Regular physical exercise is one of the most important things you can do for your health. Most adults should get at least 150 minutes of moderate-intensity exercise (any activity that increases your heart rate and causes you to sweat) each week. In addition, most adults need muscle-strengthening exercises on 2 or more days a week.  Maintain a healthy weight. The body mass index (BMI) is a screening tool to identify possible weight problems. It provides an estimate of body fat based on height and weight. Your health care provider can find your BMI and can help you achieve or maintain a healthy weight.For adults 20 years and older:  A BMI below 18.5 is considered underweight.  A BMI of 18.5 to 24.9 is normal.  A BMI of 25 to 29.9 is considered overweight.  A  BMI of 30 and above is considered obese.  Maintain normal blood lipids and cholesterol levels by exercising and minimizing your intake of saturated fat. Eat a balanced diet with plenty of fruit and vegetables. Blood tests for lipids and cholesterol should begin at age 45 and be repeated every 5 years. If your lipid or cholesterol levels are high, you are over 50, or you are at high risk for heart disease, you may need your cholesterol levels checked more frequently.Ongoing high lipid and cholesterol levels should be treated with medicines if diet and exercise are not working.  If you smoke, find out from your health care provider how to quit. If you do not use tobacco, do not start.  Lung cancer screening is recommended for adults aged 45-80 years who are at high risk for developing lung cancer because of a history of smoking. A yearly low-dose CT scan of the lungs is recommended for people who have at least a 30-pack-year history of smoking and are a current smoker or have quit within the past 15 years. A pack year of smoking is smoking an average of 1 pack of cigarettes a day for 1 year (for example: 1 pack a day for 30 years or 2 packs a day for 15 years). Yearly screening should continue until the smoker has stopped smoking for at least 15 years. Yearly screening should be stopped for people who develop a health problem that would prevent them from having lung cancer treatment.  If you are pregnant, do not drink alcohol. If you are  breastfeeding, be very cautious about drinking alcohol. If you are not pregnant and choose to drink alcohol, do not have more than 1 drink per day. One drink is considered to be 12 ounces (355 mL) of beer, 5 ounces (148 mL) of wine, or 1.5 ounces (44 mL) of liquor.  Avoid use of street drugs. Do not share needles with anyone. Ask for help if you need support or instructions about stopping the use of drugs.  High blood pressure causes heart disease and increases the risk  of stroke. Your blood pressure should be checked at least every 1 to 2 years. Ongoing high blood pressure should be treated with medicines if weight loss and exercise do not work.  If you are 55-79 years old, ask your health care provider if you should take aspirin to prevent strokes.  Diabetes screening is done by taking a blood sample to check your blood glucose level after you have not eaten for a certain period of time (fasting). If you are not overweight and you do not have risk factors for diabetes, you should be screened once every 3 years starting at age 45. If you are overweight or obese and you are 40-70 years of age, you should be screened for diabetes every year as part of your cardiovascular risk assessment.  Breast cancer screening is essential preventive care for women. You should practice "breast self-awareness." This means understanding the normal appearance and feel of your breasts and may include breast self-examination. Any changes detected, no matter how small, should be reported to a health care provider. Women in their 20s and 30s should have a clinical breast exam (CBE) by a health care provider as part of a regular health exam every 1 to 3 years. After age 40, women should have a CBE every year. Starting at age 40, women should consider having a mammogram (breast X-ray test) every year. Women who have a family history of breast cancer should talk to their health care provider about genetic screening. Women at a high risk of breast cancer should talk to their health care providers about having an MRI and a mammogram every year.  Breast cancer gene (BRCA)-related cancer risk assessment is recommended for women who have family members with BRCA-related cancers. BRCA-related cancers include breast, ovarian, tubal, and peritoneal cancers. Having family members with these cancers may be associated with an increased risk for harmful changes (mutations) in the breast cancer genes BRCA1 and  BRCA2. Results of the assessment will determine the need for genetic counseling and BRCA1 and BRCA2 testing.  Your health care provider may recommend that you be screened regularly for cancer of the pelvic organs (ovaries, uterus, and vagina). This screening involves a pelvic examination, including checking for microscopic changes to the surface of your cervix (Pap test). You may be encouraged to have this screening done every 3 years, beginning at age 21.  For women ages 30-65, health care providers may recommend pelvic exams and Pap testing every 3 years, or they may recommend the Pap and pelvic exam, combined with testing for human papilloma virus (HPV), every 5 years. Some types of HPV increase your risk of cervical cancer. Testing for HPV may also be done on women of any age with unclear Pap test results.  Other health care providers may not recommend any screening for nonpregnant women who are considered low risk for pelvic cancer and who do not have symptoms. Ask your health care provider if a screening pelvic exam is right for   you.  If you have had past treatment for cervical cancer or a condition that could lead to cancer, you need Pap tests and screening for cancer for at least 20 years after your treatment. If Pap tests have been discontinued, your risk factors (such as having a new sexual partner) need to be reassessed to determine if screening should resume. Some women have medical problems that increase the chance of getting cervical cancer. In these cases, your health care provider may recommend more frequent screening and Pap tests.  Colorectal cancer can be detected and often prevented. Most routine colorectal cancer screening begins at the age of 50 years and continues through age 75 years. However, your health care provider may recommend screening at an earlier age if you have risk factors for colon cancer. On a yearly basis, your health care provider may provide home test kits to check  for hidden blood in the stool. Use of a small camera at the end of a tube, to directly examine the colon (sigmoidoscopy or colonoscopy), can detect the earliest forms of colorectal cancer. Talk to your health care provider about this at age 50, when routine screening begins. Direct exam of the colon should be repeated every 5-10 years through age 75 years, unless early forms of precancerous polyps or small growths are found.  People who are at an increased risk for hepatitis B should be screened for this virus. You are considered at high risk for hepatitis B if:  You were born in a country where hepatitis B occurs often. Talk with your health care provider about which countries are considered high risk.  Your parents were born in a high-risk country and you have not received a shot to protect against hepatitis B (hepatitis B vaccine).  You have HIV or AIDS.  You use needles to inject street drugs.  You live with, or have sex with, someone who has hepatitis B.  You get hemodialysis treatment.  You take certain medicines for conditions like cancer, organ transplantation, and autoimmune conditions.  Hepatitis C blood testing is recommended for all people born from 1945 through 1965 and any individual with known risks for hepatitis C.  Practice safe sex. Use condoms and avoid high-risk sexual practices to reduce the spread of sexually transmitted infections (STIs). STIs include gonorrhea, chlamydia, syphilis, trichomonas, herpes, HPV, and human immunodeficiency virus (HIV). Herpes, HIV, and HPV are viral illnesses that have no cure. They can result in disability, cancer, and death.  You should be screened for sexually transmitted illnesses (STIs) including gonorrhea and chlamydia if:  You are sexually active and are younger than 24 years.  You are older than 24 years and your health care provider tells you that you are at risk for this type of infection.  Your sexual activity has changed  since you were last screened and you are at an increased risk for chlamydia or gonorrhea. Ask your health care provider if you are at risk.  If you are at risk of being infected with HIV, it is recommended that you take a prescription medicine daily to prevent HIV infection. This is called preexposure prophylaxis (PrEP). You are considered at risk if:  You are sexually active and do not regularly use condoms or know the HIV status of your partner(s).  You take drugs by injection.  You are sexually active with a partner who has HIV.  Talk with your health care provider about whether you are at high risk of being infected with HIV. If   you choose to begin PrEP, you should first be tested for HIV. You should then be tested every 3 months for as long as you are taking PrEP.  Osteoporosis is a disease in which the bones lose minerals and strength with aging. This can result in serious bone fractures or breaks. The risk of osteoporosis can be identified using a bone density scan. Women ages 67 years and over and women at risk for fractures or osteoporosis should discuss screening with their health care providers. Ask your health care provider whether you should take a calcium supplement or vitamin D to reduce the rate of osteoporosis.  Menopause can be associated with physical symptoms and risks. Hormone replacement therapy is available to decrease symptoms and risks. You should talk to your health care provider about whether hormone replacement therapy is right for you.  Use sunscreen. Apply sunscreen liberally and repeatedly throughout the day. You should seek shade when your shadow is shorter than you. Protect yourself by wearing long sleeves, pants, a wide-brimmed hat, and sunglasses year round, whenever you are outdoors.  Once a month, do a whole body skin exam, using a mirror to look at the skin on your back. Tell your health care provider of new moles, moles that have irregular borders, moles that  are larger than a pencil eraser, or moles that have changed in shape or color.  Stay current with required vaccines (immunizations).  Influenza vaccine. All adults should be immunized every year.  Tetanus, diphtheria, and acellular pertussis (Td, Tdap) vaccine. Pregnant women should receive 1 dose of Tdap vaccine during each pregnancy. The dose should be obtained regardless of the length of time since the last dose. Immunization is preferred during the 27th-36th week of gestation. An adult who has not previously received Tdap or who does not know her vaccine status should receive 1 dose of Tdap. This initial dose should be followed by tetanus and diphtheria toxoids (Td) booster doses every 10 years. Adults with an unknown or incomplete history of completing a 3-dose immunization series with Td-containing vaccines should begin or complete a primary immunization series including a Tdap dose. Adults should receive a Td booster every 10 years.  Varicella vaccine. An adult without evidence of immunity to varicella should receive 2 doses or a second dose if she has previously received 1 dose. Pregnant females who do not have evidence of immunity should receive the first dose after pregnancy. This first dose should be obtained before leaving the health care facility. The second dose should be obtained 4-8 weeks after the first dose.  Human papillomavirus (HPV) vaccine. Females aged 13-26 years who have not received the vaccine previously should obtain the 3-dose series. The vaccine is not recommended for use in pregnant females. However, pregnancy testing is not needed before receiving a dose. If a female is found to be pregnant after receiving a dose, no treatment is needed. In that case, the remaining doses should be delayed until after the pregnancy. Immunization is recommended for any person with an immunocompromised condition through the age of 61 years if she did not get any or all doses earlier. During the  3-dose series, the second dose should be obtained 4-8 weeks after the first dose. The third dose should be obtained 24 weeks after the first dose and 16 weeks after the second dose.  Zoster vaccine. One dose is recommended for adults aged 30 years or older unless certain conditions are present.  Measles, mumps, and rubella (MMR) vaccine. Adults born  before 1957 generally are considered immune to measles and mumps. Adults born in 1957 or later should have 1 or more doses of MMR vaccine unless there is a contraindication to the vaccine or there is laboratory evidence of immunity to each of the three diseases. A routine second dose of MMR vaccine should be obtained at least 28 days after the first dose for students attending postsecondary schools, health care workers, or international travelers. People who received inactivated measles vaccine or an unknown type of measles vaccine during 1963-1967 should receive 2 doses of MMR vaccine. People who received inactivated mumps vaccine or an unknown type of mumps vaccine before 1979 and are at high risk for mumps infection should consider immunization with 2 doses of MMR vaccine. For females of childbearing age, rubella immunity should be determined. If there is no evidence of immunity, females who are not pregnant should be vaccinated. If there is no evidence of immunity, females who are pregnant should delay immunization until after pregnancy. Unvaccinated health care workers born before 1957 who lack laboratory evidence of measles, mumps, or rubella immunity or laboratory confirmation of disease should consider measles and mumps immunization with 2 doses of MMR vaccine or rubella immunization with 1 dose of MMR vaccine.  Pneumococcal 13-valent conjugate (PCV13) vaccine. When indicated, a person who is uncertain of his immunization history and has no record of immunization should receive the PCV13 vaccine. All adults 65 years of age and older should receive this  vaccine. An adult aged 19 years or older who has certain medical conditions and has not been previously immunized should receive 1 dose of PCV13 vaccine. This PCV13 should be followed with a dose of pneumococcal polysaccharide (PPSV23) vaccine. Adults who are at high risk for pneumococcal disease should obtain the PPSV23 vaccine at least 8 weeks after the dose of PCV13 vaccine. Adults older than 57 years of age who have normal immune system function should obtain the PPSV23 vaccine dose at least 1 year after the dose of PCV13 vaccine.  Pneumococcal polysaccharide (PPSV23) vaccine. When PCV13 is also indicated, PCV13 should be obtained first. All adults aged 65 years and older should be immunized. An adult younger than age 65 years who has certain medical conditions should be immunized. Any person who resides in a nursing home or long-term care facility should be immunized. An adult smoker should be immunized. People with an immunocompromised condition and certain other conditions should receive both PCV13 and PPSV23 vaccines. People with human immunodeficiency virus (HIV) infection should be immunized as soon as possible after diagnosis. Immunization during chemotherapy or radiation therapy should be avoided. Routine use of PPSV23 vaccine is not recommended for American Indians, [place] Natives, or people younger than 65 years unless there are medical conditions that require PPSV23 vaccine. When indicated, people who have unknown immunization and have no record of immunization should receive PPSV23 vaccine. One-time revaccination 5 years after the first dose of PPSV23 is recommended for people aged 19-64 years who have chronic kidney failure, nephrotic syndrome, asplenia, or immunocompromised conditions. People who received 1-2 doses of PPSV23 before age 65 years should receive another dose of PPSV23 vaccine at age 65 years or later if at least 5 years have passed since the previous dose. Doses of PPSV23 are not  needed for people immunized with PPSV23 at or after age 65 years.  Meningococcal vaccine. Adults with asplenia or persistent complement component deficiencies should receive 2 doses of quadrivalent meningococcal conjugate (MenACWY-D) vaccine. The doses should be obtained   at least 2 months apart. Microbiologists working with certain meningococcal bacteria, [place] recruits, people at risk during an outbreak, and people who travel to or live in countries with a high rate of meningitis should be immunized. A first-year college student up through age 34 years who is living in a residence hall should receive a dose if she did not receive a dose on or after her 16th birthday. Adults who have certain high-risk conditions should receive one or more doses of vaccine.  Hepatitis A vaccine. Adults who wish to be protected from this disease, have certain high-risk conditions, work with hepatitis A-infected animals, work in hepatitis A research labs, or travel to or work in countries with a high rate of hepatitis A should be immunized. Adults who were previously unvaccinated and who anticipate close contact with an international adoptee during the first 60 days after arrival in the [place] from a country with a high rate of hepatitis A should be immunized.  Hepatitis B vaccine. Adults who wish to be protected from this disease, have certain high-risk conditions, may be exposed to blood or other infectious body fluids, are household contacts or sex partners of hepatitis B positive people, are clients or workers in certain care facilities, or travel to or work in countries with a high rate of hepatitis B should be immunized.  Haemophilus influenzae type b (Hib) vaccine. A previously unvaccinated person with asplenia or sickle cell disease or having a scheduled splenectomy should receive 1 dose of Hib vaccine. Regardless of previous immunization, a recipient of a hematopoietic stem cell transplant should receive a  3-dose series 6-12 months after her successful transplant. Hib vaccine is not recommended for adults with HIV infection. Preventive Services / Frequency Ages 35 to 4 years  Blood pressure check.** / Every 3-5 years.  Lipid and cholesterol check.** / Every 5 years beginning at age 60.  Clinical breast exam.** / Every 3 years for women in their 71s and 10s.  BRCA-related cancer risk assessment.** / For women who have family members with a BRCA-related cancer (breast, ovarian, tubal, or peritoneal cancers).  Pap test.** / Every 2 years from ages 76 through 26. Every 3 years starting at age 61 through age 76 or 93 with a history of 3 consecutive normal Pap tests.  HPV screening.** / Every 3 years from ages 37 through ages 60 to 51 with a history of 3 consecutive normal Pap tests.  Hepatitis C blood test.** / For any individual with known risks for hepatitis C.  Skin self-exam. / Monthly.  Influenza vaccine. / Every year.  Tetanus, diphtheria, and acellular pertussis (Tdap, Td) vaccine.** / Consult your health care provider. Pregnant women should receive 1 dose of Tdap vaccine during each pregnancy. 1 dose of Td every 10 years.  Varicella vaccine.** / Consult your health care provider. Pregnant females who do not have evidence of immunity should receive the first dose after pregnancy.  HPV vaccine. / 3 doses over 6 months, if 93 and younger. The vaccine is not recommended for use in pregnant females. However, pregnancy testing is not needed before receiving a dose.  Measles, mumps, rubella (MMR) vaccine.** / You need at least 1 dose of MMR if you were born in 1957 or later. You may also need a 2nd dose. For females of childbearing age, rubella immunity should be determined. If there is no evidence of immunity, females who are not pregnant should be vaccinated. If there is no evidence of immunity, females who are  pregnant should delay immunization until after pregnancy.  Pneumococcal  13-valent conjugate (PCV13) vaccine.** / Consult your health care provider.  Pneumococcal polysaccharide (PPSV23) vaccine.** / 1 to 2 doses if you smoke cigarettes or if you have certain conditions.  Meningococcal vaccine.** / 1 dose if you are age 68 to 8 years and a Market researcher living in a residence hall, or have one of several medical conditions, you need to get vaccinated against meningococcal disease. You may also need additional booster doses.  Hepatitis A vaccine.** / Consult your health care provider.  Hepatitis B vaccine.** / Consult your health care provider.  Haemophilus influenzae type b (Hib) vaccine.** / Consult your health care provider. Ages 7 to 53 years  Blood pressure check.** / Every year.  Lipid and cholesterol check.** / Every 5 years beginning at age 25 years.  Lung cancer screening. / Every year if you are aged 11-80 years and have a 30-pack-year history of smoking and currently smoke or have quit within the past 15 years. Yearly screening is stopped once you have quit smoking for at least 15 years or develop a health problem that would prevent you from having lung cancer treatment.  Clinical breast exam.** / Every year after age 48 years.  BRCA-related cancer risk assessment.** / For women who have family members with a BRCA-related cancer (breast, ovarian, tubal, or peritoneal cancers).  Mammogram.** / Every year beginning at age 41 years and continuing for as long as you are in good health. Consult with your health care provider.  Pap test.** / Every 3 years starting at age 65 years through age 37 or 70 years with a history of 3 consecutive normal Pap tests.  HPV screening.** / Every 3 years from ages 72 years through ages 60 to 40 years with a history of 3 consecutive normal Pap tests.  Fecal occult blood test (FOBT) of stool. / Every year beginning at age 21 years and continuing until age 5 years. You may not need to do this test if you get  a colonoscopy every 10 years.  Flexible sigmoidoscopy or colonoscopy.** / Every 5 years for a flexible sigmoidoscopy or every 10 years for a colonoscopy beginning at age 35 years and continuing until age 48 years.  Hepatitis C blood test.** / For all people born from [birth year] through 1965 and any individual with known risks for hepatitis C.  Skin self-exam. / Monthly.  Influenza vaccine. / Every year.  Tetanus, diphtheria, and acellular pertussis (Tdap/Td) vaccine.** / Consult your health care provider. Pregnant women should receive 1 dose of Tdap vaccine during each pregnancy. 1 dose of Td every 10 years.  Varicella vaccine.** / Consult your health care provider. Pregnant females who do not have evidence of immunity should receive the first dose after pregnancy.  Zoster vaccine.** / 1 dose for adults aged 30 years or older.  Measles, mumps, rubella (MMR) vaccine.** / You need at least 1 dose of MMR if you were born in 1957 or later. You may also need a second dose. For females of childbearing age, rubella immunity should be determined. If there is no evidence of immunity, females who are not pregnant should be vaccinated. If there is no evidence of immunity, females who are pregnant should delay immunization until after pregnancy.  Pneumococcal 13-valent conjugate (PCV13) vaccine.** / Consult your health care provider.  Pneumococcal polysaccharide (PPSV23) vaccine.** / 1 to 2 doses if you smoke cigarettes or if you have certain conditions.  Meningococcal vaccine.** /  Consult your health care provider.  Hepatitis A vaccine.** / Consult your health care provider.  Hepatitis B vaccine.** / Consult your health care provider.  Haemophilus influenzae type b (Hib) vaccine.** / Consult your health care provider. Ages 64 years and over  Blood pressure check.** / Every year.  Lipid and cholesterol check.** / Every 5 years beginning at age 23 years.  Lung cancer screening. / Every year if you  are aged 16-80 years and have a 30-pack-year history of smoking and currently smoke or have quit within the past 15 years. Yearly screening is stopped once you have quit smoking for at least 15 years or develop a health problem that would prevent you from having lung cancer treatment.  Clinical breast exam.** / Every year after age 74 years.  BRCA-related cancer risk assessment.** / For women who have family members with a BRCA-related cancer (breast, ovarian, tubal, or peritoneal cancers).  Mammogram.** / Every year beginning at age 44 years and continuing for as long as you are in good health. Consult with your health care provider.  Pap test.** / Every 3 years starting at age 58 years through age 22 or 39 years with 3 consecutive normal Pap tests. Testing can be stopped between 65 and 70 years with 3 consecutive normal Pap tests and no abnormal Pap or HPV tests in the past 10 years.  HPV screening.** / Every 3 years from ages 64 years through ages 70 or 61 years with a history of 3 consecutive normal Pap tests. Testing can be stopped between 65 and 70 years with 3 consecutive normal Pap tests and no abnormal Pap or HPV tests in the past 10 years.  Fecal occult blood test (FOBT) of stool. / Every year beginning at age 40 years and continuing until age 27 years. You may not need to do this test if you get a colonoscopy every 10 years.  Flexible sigmoidoscopy or colonoscopy.** / Every 5 years for a flexible sigmoidoscopy or every 10 years for a colonoscopy beginning at age 7 years and continuing until age 32 years.  Hepatitis C blood test.** / For all people born from [birth year] through 1967 and any individual with known risks for hepatitis C.  Osteoporosis screening.** / A one-time screening for women ages 30 years and over and women at risk for fractures or osteoporosis.  Skin self-exam. / Monthly.  Influenza vaccine. / Every year.  Tetanus, diphtheria, and acellular pertussis (Tdap/Td)  vaccine.** / 1 dose of Td every 10 years.  Varicella vaccine.** / Consult your health care provider.  Zoster vaccine.** / 1 dose for adults aged 35 years or older.  Pneumococcal 13-valent conjugate (PCV13) vaccine.** / Consult your health care provider.  Pneumococcal polysaccharide (PPSV23) vaccine.** / 1 dose for all adults aged 46 years and older.  Meningococcal vaccine.** / Consult your health care provider.  Hepatitis A vaccine.** / Consult your health care provider.  Hepatitis B vaccine.** / Consult your health care provider.  Haemophilus influenzae type b (Hib) vaccine.** / Consult your health care provider. ** Family history and personal history of risk and conditions may change your health care provider's recommendations.   This information is not intended to replace advice given to you by your health care provider. Make sure you discuss any questions you have with your health care provider.   Document Released: 01/23/2002 Document Revised: 12/18/2014 Document Reviewed: 04/24/2011 Elsevier Interactive Patient Education [employer].

## 2016-08-21 NOTE — Progress Notes (Signed)
Subjective:     [name] is a 55 y.o. female and is here for a comprehensive physical exam. The patient reports hip and low back pain.  She wants to get dry needling done.    Social History   Social History  . Marital status: Married    Spouse name: N/A  . Number of children: N/A  . Years of education: N/A   Occupational History  .  [employer]    [employer]   Social History Main Topics  . Smoking status: Former Smoker    Types: Cigarettes    Quit date: 02/09/1992  . Smokeless tobacco: Never Used  . Alcohol use Yes     Comment: 1-2 weekly  . Drug use: No  . Sexual activity: Yes    Partners: Male   Other Topics Concern  . Not on file   Social History Narrative  . No narrative on file   Health Maintenance  Topic Date Due  . Hepatitis C Screening  27-Nov-1961  . MAMMOGRAM  04/30/2015  . PAP SMEAR  07/30/2016  . INFLUENZA VACCINE  08/21/2017 (Originally 07/11/2016)  . TETANUS/TDAP  07/28/2018  . COLONOSCOPY  12/11/2018  . HIV Screening  Completed    The following portions of the patient's history were reviewed and updated as appropriate:  She  has a past medical history of Infertility, female and Migraines. She  does not have any pertinent problems on file. She  has a past surgical history that includes Shoulder surgery and Ankle surgery. Her family history includes Allergic rhinitis in her mother; Cancer in her maternal grandmother; Eczema in her daughter; Hypertension in her mother. She  reports that she quit smoking about 24 years ago. Her smoking use included Cigarettes. She has never used smokeless tobacco. She reports that she drinks alcohol. She reports that she does not use drugs. She has a current medication list which includes the following prescription(s): alprazolam, azelastine-fluticasone, baclofen, botox, carbinoxamine maleate er, celecoxib, cetirizine, chlorpromazine, esomeprazole, famotidine, hydrocodone-acetaminophen, metoprolol  succinate, naratriptan, ondansetron, sertraline, sulfamethoxazole-trimethoprim, sumatriptan, tizanidine, topiramate, trazodone, and pantoprazole. Current Outpatient Prescriptions on File Prior to Visit  Medication Sig Dispense Refill  . ALPRAZolam (XANAX) 0.25 MG tablet Take 1 tablet (0.25 mg total) by mouth at bedtime as needed. for sleep 30 tablet 0  . Azelastine-Fluticasone (DYMISTA) 137-50 MCG/ACT SUSP Place 2 sprays into both nostrils 1 day or 1 dose. 1 Bottle 5  . baclofen (LIORESAL) 10 MG tablet Take 1 tablet (10 mg total) by mouth every 8 (eight) hours as needed. 60 each 3  . BOTOX 100 units SOLR injection INJECT 100 UNITS INTO THE MUSCLE ONCE every 3 months 1 vial 3  . Carbinoxamine Maleate ER [facility] ER) 4 MG/5ML SUER Take 8 mg by mouth 2 (two) times daily. 480 mL 5  . celecoxib (CELEBREX) 200 MG capsule Take 1 capsule (200 mg total) by mouth 2 (two) times daily. 60 capsule 3  . cetirizine (ZYRTEC) 10 MG tablet Take 10 mg by mouth daily.    . chlorproMAZINE (THORAZINE) 25 MG tablet Take 1 tablet (25 mg total) by mouth 3 (three) times daily. 30 tablet 0  . esomeprazole (NEXIUM) 20 MG capsule Take 20 mg by mouth daily at 12 noon.    . famotidine (PEPCID) 10 MG tablet Take 10 mg by mouth 2 (two) times daily.    Marland Kitchen HYDROcodone-acetaminophen (NORCO) 7.5-325 MG tablet Take 1 tablet by mouth every 6 (six) hours as needed. 40 tablet 0  .  metoprolol succinate (TOPROL-XL) 50 MG 24 hr tablet Take 1 tablet (50 mg total) by mouth daily. 30 tablet 11  . naratriptan (AMERGE) 2.5 MG tablet Take 1 tablet (2.5 mg total) by mouth as needed. Take one (1) tablet at onset of headache; if returns or does not resolve, may repeat after 4 hours; do not exceed five (5) mg in 24 hours. 10 tablet 11  . ondansetron (ZOFRAN) 8 MG tablet Take 1 tablet (8 mg total) by mouth every 8 (eight) hours as needed. 20 tablet 3  . sertraline (ZOLOFT) 100 MG tablet Take 1 tablet (100 mg total) by mouth daily. 30 tablet 5  .  sulfamethoxazole-trimethoprim (BACTRIM DS,SEPTRA DS) 800-160 MG tablet   3  . SUMAtriptan (IMITREX) 100 MG tablet Take 1 tablet (100 mg total) by mouth once as needed for migraine. 9 tablet 11  . tiZANidine (ZANAFLEX) 4 MG tablet Take 1 tablet (4 mg total) by mouth 3 (three) times daily. 90 tablet 2  . topiramate (TOPAMAX) 50 MG tablet Take 1 tablet (50 mg total) by mouth 3 (three) times daily. 90 tablet 11   No current facility-administered medications on file prior to visit.    She has No Known Allergies..  Review of Systems Review of Systems  Constitutional: Negative for activity change, appetite change and fatigue.  HENT: Negative for hearing loss, congestion, tinnitus and ear discharge.  dentist [id number] Eyes: Negative for visual disturbance (see optho q1y -- vision corrected to 20/20 with glasses).  Respiratory: Negative for cough, chest tightness and shortness of breath.   Cardiovascular: Negative for chest pain, palpitations and leg swelling.  Gastrointestinal: Negative for abdominal pain, diarrhea, constipation and abdominal distention.  Genitourinary: Negative for urgency, frequency, decreased urine volume and difficulty urinating.  Musculoskeletal: Negative for back pain, arthralgias and gait problem.  Skin: Negative for color change, pallor and rash.  Neurological: Negative for dizziness, light-headedness, numbness and headaches.  Hematological: Negative for adenopathy. Does not bruise/bleed easily.  Psychiatric/Behavioral: Negative for suicidal ideas, confusion, sleep disturbance, self-injury, dysphoric mood, decreased concentration and agitation.       Objective:    BP 100/60 (BP Location: Right Arm, Patient Position: Sitting, Cuff Size: Normal)   Pulse 72   Temp 98.6 F (37 C) (Oral)   Resp 16   Ht 5\' 4"  (1.626 m)   Wt 169 lb 12.8 oz (77 kg)   SpO2 95%   BMI 29.15 kg/m  General appearance: alert, cooperative, appears stated age and no distress Head: Normocephalic,  without obvious abnormality, atraumatic Eyes: conjunctivae/corneas clear. PERRL, EOM's intact. Fundi benign. Ears: normal TM's and external ear canals both ears Nose: Nares normal. Septum midline. Mucosa normal. No drainage or sinus tenderness. Throat: lips, mucosa, and tongue normal; teeth and gums normal Neck: no adenopathy, no carotid bruit, no JVD, supple, symmetrical, trachea midline and thyroid not enlarged, symmetric, no tenderness/mass/nodules Back: symmetric, no curvature. ROM normal. No CVA tenderness. Lungs: clear to auscultation bilaterally Breasts: normal appearance, no masses or tenderness Heart: regular rate and rhythm, S1, S2 normal, no murmur, click, rub or gallop Abdomen: soft, non-tender; bowel sounds normal; no masses,  no organomegaly Pelvic: 0  Extremities: extremities normal, atraumatic, no cyanosis or edema Pulses: 2+ and symmetric Skin: Skin color, texture, turgor normal. No rashes or lesions Lymph nodes: Cervical, supraclavicular, and axillary nodes normal. Neurologic: Alert and oriented X 3, normal strength and tone. Normal symmetric reflexes. Normal coordination and gait     Assessment:    Healthy female exam.      Plan:   ghm utd Check labs See After Visit Summary for Counseling Recommendations    1. Need for hepatitis C screening test  - Hepatitis C Antibody  2. Screening for HIV (human immunodeficiency virus)   3. Breast cancer screening  - MM DIGITAL SCREENING BILATERAL; Future  4. Estrogen deficiency  - DG Bone Density; Future  5. Preventative health care  - POCT urinalysis dipstick; Future - CBC with Differential/Platelet; Future - Lipid panel; Future - Comprehensive metabolic panel; Future - TSH; Future - Cytology - PAP  6. Gastroesophageal reflux disease, esophagitis presence not specified  -  pantoprazole (PROTONIX) 40 MG tablet; Take 1 tablet (40 mg total) by mouth daily.  Dispense: 90 tablet; Refill: 3  7. Insomnia  - traZODone (DESYREL) 50 MG tablet; Take 0.5-1 tablets (25-50 mg total) by mouth at bedtime as needed. for sleep  Dispense: 90 tablet; Refill: 1  8. Hx of migraines   - NONFORMULARY OR COMPOUNDED ITEM; Dry needling  Dx migraines, L hip pain  Dispense: 1 each; Refill: 0 - NONFORMULARY OR COMPOUNDED ITEM; Dry needling  Dx Migraine, L hip pain  Dispense: 1 each; Refill: 0  9. Left hip pain   - NONFORMULARY OR COMPOUNDED ITEM; Dry needling  Dx migraines, L hip pain  Dispense: 1 each; Refill: 0 - NONFORMULARY OR COMPOUNDED ITEM; Dry needling  Dx Migraine, L hip pain  Dispense: 1 each; Refill: 0

## 2016-08-21 NOTE — Progress Notes (Signed)
Pre visit review using our clinic review tool, if applicable. No additional management support is needed unless otherwise documented below in the visit note. 

## 2016-08-23 LAB — CYTOLOGY - PAP

## 2016-09-04 ENCOUNTER — Ambulatory Visit (INDEPENDENT_AMBULATORY_CARE_PROVIDER_SITE_OTHER): Payer: 59 | Admitting: Rehabilitative and Restorative Service Providers"

## 2016-09-04 ENCOUNTER — Encounter: Payer: Self-pay | Admitting: Rehabilitative and Restorative Service Providers"

## 2016-09-04 DIAGNOSIS — G4459 Other complicated headache syndrome: Secondary | ICD-10-CM | POA: Diagnosis not present

## 2016-09-04 DIAGNOSIS — R29898 Other symptoms and signs involving the musculoskeletal system: Secondary | ICD-10-CM | POA: Diagnosis not present

## 2016-09-04 DIAGNOSIS — R293 Abnormal posture: Secondary | ICD-10-CM | POA: Diagnosis not present

## 2016-09-04 DIAGNOSIS — M25552 Pain in left hip: Secondary | ICD-10-CM

## 2016-09-04 NOTE — Patient Instructions (Addendum)
HIP: Hamstrings - Supine Outer Hip Stretch: Reclined IT Band Stretch (Strap)   Strap around one foot, pull leg across body until you feel a pull or stretch, with shoulders on mat. Hold for 30 seconds. Repeat 3 times each leg. 2-3 times/day.  Piriformis Stretch   Lying on back, pull right knee toward opposite shoulder. Hold 30 seconds. Repeat 3 times. Do 2-3 sessions per day.   flexors, Supine    Lie on back, head on small pilow. Tuck chin down.  Hold _10__ seconds. Repeat _5-10__ times per session. Do __several_ sessions per day.   Axial Extension (Chin Tuck)    Pull chin in and lengthen back of neck. Hold __10-15__ seconds while counting out loud. Repeat __10__ times. Do ___several_ sessions per day.     Trigger Point Dry Needling  . What is Trigger Point Dry Needling (DN)? o DN is a physical therapy technique used to treat muscle pain and dysfunction. Specifically, DN helps deactivate muscle trigger points (muscle knots).  o A thin filiform needle is used to penetrate the skin and stimulate the underlying trigger point. The goal is for a local twitch response (LTR) to occur and for the trigger point to relax. No medication of any kind is injected during the procedure.   . What Does Trigger Point Dry Needling Feel Like?  o The procedure feels different for each individual patient. Some patients report that they do not actually feel the needle enter the skin and overall the process is not painful. Very mild bleeding may occur. However, many patients feel a deep cramping in the muscle in which the needle was inserted. This is the local twitch response.   Marland Kitchen. How Will I feel after the treatment? o Soreness is normal, and the onset of soreness may not occur for a few hours. Typically this soreness does not last longer than two days.  o Bruising is uncommon, however; ice can be used to decrease any possible bruising.  o In rare cases feeling tired or nauseous after the treatment  is normal. In addition, your symptoms may get worse before they get better, this period will typically not last longer than 24 hours.   . What Can I do After My Treatment? o Increase your hydration by drinking more water for the next 24 hours. o You may place ice or heat on the areas treated that have become sore, however, do not use heat on inflamed or bruised areas. Heat often brings more relief post needling. o You can continue your regular activities, but vigorous activity is not recommended initially after the treatment for 24 hours. o DN is best combined with other physical therapy such as strengthening, stretching, and other therapies.

## 2016-09-04 NOTE — Therapy (Addendum)
[facility] [place], [place], [address] Phone: [phone]   Fax:  [phone]  Physical Therapy Evaluation  Patient Details  Name: [name] MRN: [id number] Date of Birth: 19-Oct-1961 Referring Provider: Dr. Ann Held   Encounter Date: 09/04/2016      PT End of Session - 09/04/16 1246    Visit Number 1   Number of Visits 12   Date for PT Re-Evaluation 10/16/16   PT Start Time 6962   PT Stop Time 1255   PT Time Calculation (min) 61 min   Activity Tolerance Patient tolerated treatment well      Past Medical History:  Diagnosis Date  . Infertility, female   . Migraines     Past Surgical History:  Procedure Laterality Date  . ANKLE SURGERY     Left  . SHOULDER SURGERY     Left    There were no vitals filed for this visit.       Subjective Assessment - 09/04/16 1157    Subjective Patient reports that she has had migraines for the past 40 years. She is on several medications to prevent migraines. She awoke with a migraine this morning. She took 4 OTC antiinflammatory meds this am.   Also reports pain in the Lf hip for ~ 2 months with no known cause. She has pain with ascending steps; bending and squatting; difficulty lying on Lt side to sleep.    Pertinent History 40 year history of migraine headaches; Lt hip pain/bursitis    Diagnostic tests CT/MRI/multiple tests    Patient Stated Goals to find alternative to help control migraines    Currently in Pain? Yes   Pain Score 5    Pain Location Head   Pain Orientation Right   Pain Descriptors / Indicators Sharp;Stabbing;Throbbing   Pain Type Chronic pain   Pain Radiating Towards into the Rt neck and shoudler to scapula and in the temple    Pain Onset Today   Pain Frequency --  has migraines 5-6 /month lasting for ~ 30 min to 4 days   Aggravating Factors  light; odors; smoke; perfume; sounds; bending head forward; lifint; using arms  overhead   Pain Relieving Factors meds; Botox injections decrease intensity; lying in dark room with ice pack; essential oils     Multiple Pain Sites Yes   Pain Score 0   Pain Location Hip   Pain Orientation Left   Pain Descriptors / Indicators Sharp   Pain Type Acute pain   Pain Onset More than a month ago   Pain Frequency Intermittent   Aggravating Factors  going up steps 8/10 - more pain the faster she ascends the stairs    Pain Relieving Factors avoiding stairs and activities that irritate the hip             [facility] PT Assessment - 09/04/16 0001      Assessment   Medical Diagnosis Migraine/Lt hip pain    Referring Provider Dr. Rosalita Chessman Chase    Onset Date/Surgical Date 09/04/16   Hand Dominance Right   Next MD Visit PRN   Prior Therapy yes for migraines and cervical pain - most recently ~ 10-12 months     Precautions   Precautions None     Balance Screen   Has the patient fallen in the past 6 months No   Has the patient had a decrease in activity level because of a fear of  falling?  No   Is the patient reluctant to leave their home because of a fear of falling?  No     Prior Function   Level of Independence Independent   Vocation Part time employment   Education officer, museum - varied positions; sitting; standing; computer    Leisure household chores; cooking; Medical sales representative      Observation/Other Assessments   Focus on Therapeutic Outcomes (FOTO)  38% limitation     Posture/Postural Control   Posture Comments head forward; shoulders rounded and elevated; incresaed thoracic kyphosis; scapulae abducted and rotated along the thoracic wall     AROM   Overall AROM Comments tight Lt hip rotation    Cervical Flexion 67   Cervical Extension 56   Cervical - Right Side Bend 50   Cervical - Left Side Bend 43   Cervical - Right Rotation 67   Cervical - Left Rotation 73     Strength   Overall Strength Comments 5/5 bilat U/LE's      Flexibility    Hamstrings tight bilat ~ 80 deg    ITB tight Lt > Rt    Piriformis tight Lt      Palpation   Spinal mobility hypomobile cervical spine with grade II/III CPA and UPA mobs    Palpation comment tightness noted through ant/lat/post cervical musculature; upper trap; leveator; occipital ridge; temples bilat Rt > Lt                    [facility] Adult PT Treatment/Exercise - 09/04/16 0001      Neck Exercises: Supine   Neck Retraction 5 reps;10 secs     Knee/Hip Exercises: Stretches   ITB Stretch 2 reps;30 seconds   Piriformis Stretch 3 reps;30 seconds  travell     Moist Heat Therapy   Number Minutes Moist Heat 15 Minutes   Moist Heat Location Lumbar Spine;Hip  Lt hip      Cryotherapy   Number Minutes Cryotherapy 15 Minutes   Cryotherapy Location Cervical  head    Type of Cryotherapy Ice pack     Electrical Stimulation   Electrical Stimulation Location bilat cervical at occipital area and upper traps    Electrical Stimulation Action IFC   Electrical Stimulation Parameters to toerance   Electrical Stimulation Goals Pain;Tone     Manual Therapy   Manual Therapy Myofascial release;Manual Traction;Soft tissue mobilization  Simultaneous filing. User may not have seen previous data.   Manual therapy comments pt in hooklying.     Soft tissue mobilization to bilat upper trap, temporalis, cervical paraspinals.    Myofascial Release suboccipital release, pin and stretch with lateral Lt flexion.  Rt platysma.    Manual Traction cervical - 30 sec hold x 4 reps                 PT Education - 09/04/16 1233    Education provided Yes   Education Details HEP TDN    Person(s) Educated Patient   Methods Explanation;Demonstration;Tactile cues;Verbal cues;Handout   Comprehension Verbalized understanding;Returned demonstration;Verbal cues required;Tactile cues required             PT Long Term Goals - 09/04/16 1255      PT LONG TERM GOAL #1   Title Improve cervical and  thoracic posture and alignment with patient to demonstrate more upright posture in sitting and standing 10/15/16   Time 6   Period Weeks   Status New     PT LONG TERM  GOAL #2   Title Increase end range cervical extension and rotation by 3-5 degrees 10/15/16   Time 6   Period Weeks   Status New     PT LONG TERM GOAL #3   Title Increase tissue extensibility through the Lt hip musculature thus improving pain free function.Patient reports ability to ascend steps at home without pain 10/15/16   Time 6   Period Weeks   Status New     PT LONG TERM GOAL #4   Title Independent in HEP for Lt hip and for cervical component of migranie management program 10/15/16   Time 6   Period Weeks   Status New     PT LONG TERM GOAL #5   Title Improve FOTO to </= 30% limitation 10/15/16   Time 6   Period Weeks   Status New               Plan - 09/04/16 1248    Clinical Impression Statement Patient presents with 40 year history of intermittent migraines. She manages migraines with medications preventatively but continues to have 5-6 migraines monthly - lasting from 30 min to 4 days. Patient has poor cervical and thoracic posture and alignment; limited cervical end range mobility; head forward postureing; muscular tightness to palpation through the ant/lat/posterior cervical musculature into the occipital ridge and temple. Soft tissue involvement is greater on Rt today. Patient in interested in trial of TDN for migraines. She also presents today with 2 month history of Lt hip pain - primarily noted with ascending steps, squatting or bending.  Patient has tightness through the Lt piriformis and hip abductors as well as limited hip rotation and decreased soft tissue extensibility through the Lt hip musculature    Rehab Potential Good   PT Frequency 2x / week   PT Duration 6 weeks   PT Treatment/Interventions Patient/family education;ADLs/Self Care Home Management;Neuromuscular  re-education;Cryotherapy;Electrical Stimulation;Iontophoresis 51m/ml Dexamethasone;Moist Heat;Traction;Ultrasound;Dry needling;Manual techniques;Therapeutic activities;Therapeutic exercise   PT Next Visit Plan trial of TDN for cervical musculature; manual work; postural correction; assess response to hip stretchinges and progress with strengthening for hip; modaliities    Consulted and Agree with Plan of Care Patient      Patient will benefit from skilled therapeutic intervention in order to improve the following deficits and impairments:  Postural dysfunction, Improper body mechanics, Pain, Decreased range of motion, Decreased mobility, Increased fascial restricitons, Increased muscle spasms, Decreased activity tolerance  Visit Diagnosis: Other complicated headache syndrome - Plan: PT plan of care cert/re-cert  Other symptoms and signs involving the musculoskeletal system - Plan: PT plan of care cert/re-cert  Abnormal posture - Plan: PT plan of care cert/re-cert  Pain in left hip - Plan: PT plan of care cert/re-cert     Problem List Patient Active Problem List   Diagnosis Date Noted  . Plantar fasciitis of right foot 02/02/2016  . Chronic rhinitis 10/20/2015  . Overweight 01/22/2015  . Anxiety 07/14/2014  . Insomnia 05/28/2012  . Migraine 05/28/2012  . HEADACHE 04/07/2009    Hanaa Payes PNilda SimmerPT, MPH  09/04/2016, 1:36 PM  [facility] [address] [place] [address]Phone: [phone]  Fax:  [phone] Name: AMAXIMA SKELTONMRN: [id number] of Birth: 609-26-1962 PHYSICAL THERAPY DISCHARGE SUMMARY  Visits from Start of Care: Eval only  Current functional level related to goals / functional outcomes: Unchanged   Remaining deficits: Unchanged   Education / Equipment: Initial HEP Plan: Patient agrees  to discharge.  Patient goals were not met. Patient is being discharged due to not returning since  the last visit.  ?????    Dirk Vanaman P. Helene Kelp PT, MPH 11/07/16 10:10 AM

## 2016-09-14 ENCOUNTER — Other Ambulatory Visit: Payer: Self-pay | Admitting: Physician Assistant

## 2016-09-14 DIAGNOSIS — F419 Anxiety disorder, unspecified: Secondary | ICD-10-CM

## 2016-09-14 MED FILL — SERTRALINE HCL 100 MG TAB: 100 | 30 days supply | Qty: 30 | Fill #2

## 2016-09-14 MED FILL — BACLOFEN 10 MG TABLET: 10 | 20 days supply | Qty: 60 | Fill #2

## 2016-09-15 ENCOUNTER — Ambulatory Visit (INDEPENDENT_AMBULATORY_CARE_PROVIDER_SITE_OTHER): Payer: 59 | Admitting: Physician Assistant

## 2016-09-15 ENCOUNTER — Encounter: Payer: Self-pay | Admitting: Physician Assistant

## 2016-09-15 VITALS — BP 111/67 | HR 57 | Wt 170.0 lb

## 2016-09-15 DIAGNOSIS — M62838 Other muscle spasm: Secondary | ICD-10-CM

## 2016-09-15 DIAGNOSIS — M542 Cervicalgia: Secondary | ICD-10-CM

## 2016-09-15 DIAGNOSIS — G43009 Migraine without aura, not intractable, without status migrainosus: Secondary | ICD-10-CM

## 2016-09-15 DIAGNOSIS — F419 Anxiety disorder, unspecified: Secondary | ICD-10-CM | POA: Diagnosis not present

## 2016-09-15 DIAGNOSIS — G43011 Migraine without aura, intractable, with status migrainosus: Secondary | ICD-10-CM

## 2016-09-15 MED ORDER — TOPIRAMATE 50 MG PO TABS: 50.0000 mg | ORAL_TABLET | Freq: Three times a day (TID) | ORAL | 11 refills | Status: DC

## 2016-09-15 MED ORDER — NARATRIPTAN HCL 2.5 MG PO TABS: 2.5000 mg | ORAL_TABLET | ORAL | 11 refills | Status: DC | PRN

## 2016-09-15 MED ORDER — ONDANSETRON HCL 8 MG PO TABS: 8.0000 mg | ORAL_TABLET | Freq: Three times a day (TID) | ORAL | 3 refills | Status: DC | PRN

## 2016-09-15 MED ORDER — ALPRAZOLAM 0.25 MG PO TABS: 0.2500 mg | ORAL_TABLET | Freq: Every evening | ORAL | 0 refills | Status: DC | PRN

## 2016-09-15 MED ORDER — HYDROCODONE-ACETAMINOPHEN 7.5-325 MG PO TABS: 1.0000 | ORAL_TABLET | Freq: Four times a day (QID) | ORAL | 0 refills | Status: DC | PRN

## 2016-09-15 MED ORDER — TIZANIDINE HCL 4 MG PO TABS: 4.0000 mg | ORAL_TABLET | Freq: Three times a day (TID) | ORAL | 2 refills | Status: DC

## 2016-09-15 MED ORDER — SUMATRIPTAN SUCCINATE 100 MG PO TABS: 100.0000 mg | ORAL_TABLET | Freq: Once | ORAL | 11 refills | Status: DC | PRN

## 2016-09-15 MED FILL — TOPIRAMATE 50 MG TABLET: 50 | 30 days supply | Qty: 90 | Fill #0

## 2016-09-15 MED FILL — ONDANSETRON HCL 8 MG TABLET: 8 | 6 days supply | Qty: 20 | Fill #0

## 2016-09-15 MED FILL — tiZANidine HCL 4 MG TABS: 4 | 30 days supply | Qty: 90 | Fill #0

## 2016-09-15 MED FILL — SUMATRIPTAN SUCC 100 MG TAB: 100 | 30 days supply | Qty: 9 | Fill #0

## 2016-09-15 NOTE — Progress Notes (Signed)
History:  [name] is a 55 y.o. G1P1001 who presents to clinic today for eval of migraine.  She has decided to stop using Botox due to price increase from $50 to $300.  She last had Botox 3 months ago and therefore does not yet have an increase in HA numbers.  She feels very stable and requests to continue as is.   She continues to have severe muscle spasm, and has not been for massage in some time.   Uses xanax to help her sleep and sometimes muscle relaxant to help her sleep.    HIT6:60 Number of days in the last 4 weeks with:  Severe headache: 4 Moderate headache: 4 Mild headache: 0  No headache: 20   Past Medical History:  Diagnosis Date  . Infertility, female   . Migraines     Social History   Social History  . Marital status: Married    Spouse name: N/A  . Number of children: N/A  . Years of education: N/A   Occupational History  .  [employer]    [employer]   Social History Main Topics  . Smoking status: Former Smoker    Types: Cigarettes    Quit date: 02/09/1992  . Smokeless tobacco: Never Used  . Alcohol use Yes     Comment: 1-2 weekly  . Drug use: No  . Sexual activity: Yes    Partners: Male   Other Topics Concern  . Not on file   Social History Narrative  . No narrative on file    Family History  Problem Relation Age of Onset  . Hypertension Mother   . Allergic rhinitis Mother   . Cancer Maternal Grandmother   . Stroke    . Eczema Daughter     No Known Allergies  Current Outpatient Prescriptions on File Prior to Visit  Medication Sig Dispense Refill  . ALPRAZolam (XANAX) 0.25 MG tablet Take 1 tablet (0.25 mg total) by mouth at bedtime as needed. for sleep 30 tablet 0  . Azelastine-Fluticasone (DYMISTA) 137-50 MCG/ACT SUSP Place 2 sprays into both nostrils 1 day or 1 dose. 1 Bottle 5  . baclofen (LIORESAL) 10 MG tablet Take 1 tablet (10 mg total) by mouth every 8 (eight) hours as needed. 60 each 3  . BOTOX 100 units SOLR  injection INJECT 100 UNITS INTO THE MUSCLE ONCE every 3 months 1 vial 3  . Carbinoxamine Maleate ER [facility] ER) 4 MG/5ML SUER Take 8 mg by mouth 2 (two) times daily. 480 mL 5  . celecoxib (CELEBREX) 200 MG capsule Take 1 capsule (200 mg total) by mouth 2 (two) times daily. 60 capsule 3  . cetirizine (ZYRTEC) 10 MG tablet Take 10 mg by mouth daily.    . chlorproMAZINE (THORAZINE) 25 MG tablet Take 1 tablet (25 mg total) by mouth 3 (three) times daily. 30 tablet 0  . esomeprazole (NEXIUM) 20 MG capsule Take 20 mg by mouth daily at 12 noon.    . famotidine (PEPCID) 10 MG tablet Take 10 mg by mouth 2 (two) times daily.    Marland Kitchen HYDROcodone-acetaminophen (NORCO) 7.5-325 MG tablet Take 1 tablet by mouth every 6 (six) hours as needed. 40 tablet 0  . metoprolol succinate (TOPROL-XL) 50 MG 24 hr tablet Take 1 tablet (50 mg total) by mouth daily. 30 tablet 11  . naratriptan (AMERGE) 2.5 MG tablet Take 1 tablet (2.5 mg total) by mouth as needed. Take one (1) tablet at onset of headache;  if returns or does not resolve, may repeat after 4 hours; do not exceed five (5) mg in 24 hours. 10 tablet 11  . NONFORMULARY OR COMPOUNDED ITEM Dry needling  Dx migraines, L hip pain 1 each 0  . NONFORMULARY OR COMPOUNDED ITEM Dry needling  Dx Migraine, L hip pain 1 each 0  . ondansetron (ZOFRAN) 8 MG tablet Take 1 tablet (8 mg total) by mouth every 8 (eight) hours as needed. 20 tablet 3  . pantoprazole (PROTONIX) 40 MG tablet Take 1 tablet (40 mg total) by mouth daily. 90 tablet 3  . sertraline (ZOLOFT) 100 MG tablet Take 1 tablet (100 mg total) by mouth daily. 30 tablet 5  . sulfamethoxazole-trimethoprim (BACTRIM DS,SEPTRA DS) 800-160 MG tablet   3  . SUMAtriptan (IMITREX) 100 MG tablet Take 1 tablet (100 mg total) by mouth once as needed for migraine. 9 tablet 11  . tiZANidine (ZANAFLEX) 4 MG tablet Take 1 tablet (4 mg total) by mouth 3 (three) times daily. 90 tablet 2  . topiramate (TOPAMAX) 50 MG tablet Take 1 tablet (50  mg total) by mouth 3 (three) times daily. 90 tablet 11  . traZODone (DESYREL) 50 MG tablet Take 0.5-1 tablets (25-50 mg total) by mouth at bedtime as needed. for sleep 90 tablet 1   No current facility-administered medications on file prior to visit.      Review of Systems:  All pertinent positive/negative included in HPI, all other review of systems are negative  Objective:  Physical Exam BP 111/67   Pulse (!) 57   Wt 170 lb (77.1 kg)   BMI 29.18 kg/m  CONSTITUTIONAL: Well-developed, well-nourished female in no acute distress.  EYES: EOM intact ENT: Normocephalic CARDIOVASCULAR: Regular rate and rhythm with no adventitious sounds.  RESPIRATORY: Normal rate. Clear to auscultation bilaterally.  MUSCULOSKELETAL: Normal ROM, strength equal bilaterally, significant trapezius muscle spasm noted bilaterally as well as cervical paraspinal muscles SKIN: Warm, dry without erythema  NEUROLOGICAL: Alert, oriented, CN II-XII grossly intact, Appropriate balancePSYCH: Normal behavior, mood   Assessment & Plan:  Assessment: 1. Muscle spasm   2. Anxiety   3. Intractable migraine without aura and with status migrainosus   4. Migraine without aura and without status migrainosus, not intractable   5. Neck pain   '  Plan: Continue to work toward minimizing xanax use.  Okay to use zanaflex at night to help with sleep and muscle spasm but do not use with xanax.   Do not mix triptans Do see massage therapist regularly Do use heat for neck spasm Will re-eval prevntive strategies needed as HAs return after botox wearing off.   Follow-up in 3 months or sooner PRN  Bertram DenverKaren E Teague Clark, PA-C 09/15/2016 11:22 AM

## 2016-09-15 NOTE — Patient Instructions (Signed)

## 2016-09-21 MED FILL — HYDROCODON-APAP 7.5-325: 7.5-325 | 7 days supply | Qty: 30 | Fill #0

## 2016-09-21 MED FILL — NARATRIPTAN HCL 2.5 MG TAB: 2.5 | 30 days supply | Qty: 9 | Fill #0

## 2016-09-21 MED FILL — METOPROLOL SUCC ER 50 MG TA: 50 | 30 days supply | Qty: 30 | Fill #2

## 2016-09-25 ENCOUNTER — Other Ambulatory Visit: Payer: Self-pay | Admitting: *Deleted

## 2016-09-25 DIAGNOSIS — F419 Anxiety disorder, unspecified: Secondary | ICD-10-CM

## 2016-09-25 MED ORDER — ALPRAZOLAM 0.25 MG PO TABS: ORAL_TABLET | ORAL | 2 refills | Status: DC

## 2016-09-25 MED FILL — ALPRAZolam 0.25 MG TABS: 0.25 | 25 days supply | Qty: 25 | Fill #0

## 2016-10-11 DIAGNOSIS — H524 Presbyopia: Secondary | ICD-10-CM | POA: Diagnosis not present

## 2016-10-11 DIAGNOSIS — H5203 Hypermetropia, bilateral: Secondary | ICD-10-CM | POA: Diagnosis not present

## 2016-10-11 DIAGNOSIS — H52223 Regular astigmatism, bilateral: Secondary | ICD-10-CM | POA: Diagnosis not present

## 2016-10-16 MED FILL — METOPROLOL SUCC ER 50 MG TA: 50 | 30 days supply | Qty: 30 | Fill #3

## 2016-10-16 MED FILL — TOPIRAMATE 50 MG TABLET: 50 | 30 days supply | Qty: 90 | Fill #1

## 2016-10-16 MED FILL — SERTRALINE HCL 100 MG TAB: 100 | 30 days supply | Qty: 30 | Fill #3

## 2016-10-16 MED FILL — CELECOXIB 200 MG CAPSULE: 200 | 30 days supply | Qty: 60 | Fill #1

## 2016-10-23 MED FILL — ALPRAZolam 0.25 MG TABS: 0.25 | 25 days supply | Qty: 25 | Fill #1

## 2016-10-23 MED FILL — ONDANSETRON HCL 8 MG TABLET: 8 | 6 days supply | Qty: 20 | Fill #1

## 2016-11-10 MED FILL — SULFAMETHOXAZOLE-TMP DS TAB: 800-160 | 15 days supply | Qty: 30 | Fill #2

## 2016-11-15 MED FILL — traZODone HCL 50 MG TABS: 50 | 90 days supply | Qty: 90 | Fill #1

## 2016-11-15 MED FILL — SERTRALINE HCL 100 MG TAB: 100 | 30 days supply | Qty: 30 | Fill #4

## 2016-11-15 MED FILL — METOPROLOL SUCC ER 50 MG TA: 50 | 30 days supply | Qty: 30 | Fill #4

## 2016-11-15 MED FILL — PANTOPRAZOLE SOD DR 40 MG T: 40 | 90 days supply | Qty: 90 | Fill #1

## 2016-11-15 MED FILL — TOPIRAMATE 50 MG TABLET: 50 | 30 days supply | Qty: 90 | Fill #2

## 2016-11-21 ENCOUNTER — Other Ambulatory Visit: Payer: 59

## 2016-11-21 ENCOUNTER — Ambulatory Visit: Payer: 59

## 2016-11-30 MED FILL — ALPRAZolam 0.25 MG TABS: 0.25 | 25 days supply | Qty: 25 | Fill #2

## 2016-11-30 MED FILL — CELECOXIB 200 MG CAPSULE: 200 | 30 days supply | Qty: 60 | Fill #2

## 2016-12-11 ENCOUNTER — Telehealth: Payer: Self-pay | Admitting: *Deleted

## 2016-12-11 MED ORDER — PREDNISONE 20 MG PO TABS: 20.0000 mg | ORAL_TABLET | Freq: Two times a day (BID) | ORAL | 0 refills | Status: DC

## 2016-12-11 MED ORDER — DOXYCYCLINE HYCLATE 100 MG PO CAPS: 100.0000 mg | ORAL_CAPSULE | Freq: Two times a day (BID) | ORAL | 0 refills | Status: DC

## 2016-12-11 NOTE — Telephone Encounter (Signed)
Pt called c/o sinus pressure, nasal congestion, fever, HA and ear pain x 2 wks. She has taken Sudafed, mucinex, IBF, Afrin, and nasal saline rinses without relief.

## 2016-12-15 ENCOUNTER — Ambulatory Visit (INDEPENDENT_AMBULATORY_CARE_PROVIDER_SITE_OTHER): Payer: 59 | Admitting: Physician Assistant

## 2016-12-15 ENCOUNTER — Encounter: Payer: Self-pay | Admitting: Physician Assistant

## 2016-12-15 VITALS — BP 118/82 | HR 62 | Resp 18 | Ht 65.0 in | Wt 173.0 lb

## 2016-12-15 DIAGNOSIS — F419 Anxiety disorder, unspecified: Secondary | ICD-10-CM

## 2016-12-15 DIAGNOSIS — G43009 Migraine without aura, not intractable, without status migrainosus: Secondary | ICD-10-CM | POA: Diagnosis not present

## 2016-12-15 DIAGNOSIS — M542 Cervicalgia: Secondary | ICD-10-CM

## 2016-12-15 DIAGNOSIS — G4700 Insomnia, unspecified: Secondary | ICD-10-CM

## 2016-12-15 MED ORDER — TOPIRAMATE 200 MG PO TABS: 200.0000 mg | ORAL_TABLET | Freq: Every day | ORAL | 11 refills | Status: DC

## 2016-12-15 MED ORDER — SERTRALINE HCL 100 MG PO TABS: 100.0000 mg | ORAL_TABLET | Freq: Every day | ORAL | 10 refills | Status: DC

## 2016-12-15 MED ORDER — ONDANSETRON HCL 8 MG PO TABS: 8.0000 mg | ORAL_TABLET | Freq: Three times a day (TID) | ORAL | 3 refills | Status: DC | PRN

## 2016-12-15 MED ORDER — HYDROCODONE-ACETAMINOPHEN 7.5-325 MG PO TABS: 1.0000 | ORAL_TABLET | Freq: Four times a day (QID) | ORAL | 0 refills | Status: DC | PRN

## 2016-12-15 MED ORDER — ALPRAZOLAM 0.25 MG PO TABS: ORAL_TABLET | ORAL | 2 refills | Status: DC

## 2016-12-15 MED ORDER — TIZANIDINE HCL 4 MG PO TABS: 4.0000 mg | ORAL_TABLET | Freq: Three times a day (TID) | ORAL | 2 refills | Status: DC

## 2016-12-15 MED FILL — SERTRALINE HCL 100 MG TAB: 100 | 30 days supply | Qty: 30 | Fill #0

## 2016-12-15 MED FILL — HYDROCODON-APAP 7.5-325: 7.5-325 | 7 days supply | Qty: 30 | Fill #0

## 2016-12-15 MED FILL — TOPIRAMATE 200 MG TABLET: 200 | 30 days supply | Qty: 30 | Fill #0

## 2016-12-15 MED FILL — ALPRAZolam 0.25 MG TABS: 0.25 | 30 days supply | Qty: 30 | Fill #0

## 2016-12-15 MED FILL — ONDANSETRON HCL 8 MG TABLET: 8 | 7 days supply | Qty: 20 | Fill #0

## 2016-12-15 MED FILL — tiZANidine HCL 4 MG TABS: 4 | 30 days supply | Qty: 90 | Fill #0

## 2016-12-15 NOTE — Patient Instructions (Signed)
Migraine Headache A migraine headache is an intense, throbbing pain on one side or both sides of the head. Migraines may also cause other symptoms, such as nausea, vomiting, and sensitivity to light and noise. What are the causes? Doing or taking certain things may also trigger migraines, such as:  Alcohol.  Smoking.  Medicines, such as: ? Medicine used to treat chest pain (nitroglycerine). ? Birth control pills. ? Estrogen pills. ? Certain blood pressure medicines.  Aged cheeses, chocolate, or caffeine.  Foods or drinks that contain nitrates, glutamate, aspartame, or tyramine.  Physical activity.  Other things that may trigger a migraine include:  Menstruation.  Pregnancy.  Hunger.  Stress, lack of sleep, too much sleep, or fatigue.  Weather changes.  What increases the risk? The following factors may make you more likely to experience migraine headaches:  Age. Risk increases with age.  Family history of migraine headaches.  Being Caucasian.  Depression and anxiety.  Obesity.  Being a woman.  Having a hole in the heart (patent foramen ovale) or other heart problems.  What are the signs or symptoms? The main symptom of this condition is pulsating or throbbing pain. Pain may:  Happen in any area of the head, such as on one side or both sides.  Interfere with daily activities.  Get worse with physical activity.  Get worse with exposure to bright lights or loud noises.  Other symptoms may include:  Nausea.  Vomiting.  Dizziness.  General sensitivity to bright lights, loud noises, or smells.  Before you get a migraine, you may get warning signs that a migraine is developing (aura). An aura may include:  Seeing flashing lights or having blind spots.  Seeing bright spots, halos, or zigzag lines.  Having tunnel vision or blurred vision.  Having numbness or a tingling feeling.  Having trouble talking.  Having muscle weakness.  How is this  diagnosed? A migraine headache can be diagnosed based on:  Your symptoms.  A physical exam.  Tests, such as CT scan or MRI of the head. These imaging tests can help rule out other causes of headaches.  Taking fluid from the spine (lumbar puncture) and analyzing it (cerebrospinal fluid analysis, or CSF analysis).  How is this treated? A migraine headache is usually treated with medicines that:  Relieve pain.  Relieve nausea.  Prevent migraines from coming back.  Treatment may also include:  Acupuncture.  Lifestyle changes like avoiding foods that trigger migraines.  Follow these instructions at home: Medicines  Take over-the-counter and prescription medicines only as told by your health care provider.  Do not drive or use heavy machinery while taking prescription pain medicine.  To prevent or treat constipation while you are taking prescription pain medicine, your health care provider may recommend that you: ? Drink enough fluid to keep your urine clear or pale yellow. ? Take over-the-counter or prescription medicines. ? Eat foods that are high in fiber, such as fresh fruits and vegetables, whole grains, and beans. ? Limit foods that are high in fat and processed sugars, such as fried and sweet foods. Lifestyle  Avoid alcohol use.  Do not use any products that contain nicotine or tobacco, such as cigarettes and e-cigarettes. If you need help quitting, ask your health care provider.  Get at least 8 hours of sleep every night.  Limit your stress. General instructions   Keep a journal to find out what may trigger your migraine headaches. For example, write down: ? What you eat and   drink. ? How much sleep you get. ? Any change to your diet or medicines.  If you have a migraine: ? Avoid things that make your symptoms worse, such as bright lights. ? It may help to lie down in a dark, quiet room. ? Do not drive or use heavy machinery. ? Ask your health care provider  what activities are safe for you while you are experiencing symptoms.  Keep all follow-up visits as told by your health care provider. This is important. Contact a health care provider if:  You develop symptoms that are different or more severe than your usual migraine symptoms. Get help right away if:  Your migraine becomes severe.  You have a fever.  You have a stiff neck.  You have vision loss.  Your muscles feel weak or like you cannot control them.  You start to lose your balance often.  You develop trouble walking.  You faint. This information is not intended to replace advice given to you by your health care provider. Make sure you discuss any questions you have with your health care provider. Document Released: 11/27/2005 Document Revised: 06/16/2016 Document Reviewed: 05/15/2016 Elsevier Interactive Patient Education  2017 [employer]   

## 2016-12-15 NOTE — Progress Notes (Signed)
History:  [name] is a 56 y.o. G1P1001 who presents to clinic today for HA.  She feels her HA's are much much worse.  She has been 6 months since last treatment and her HA's are much more frequent and more severe.  She is using up all of her acute medications, triptans, steroid injections, toradol, hydrocodone, etc.  Presently on prednisone 40mg  x 5 days for a sinus infection.  That is not helping her HA today.   She does not want trigger point injections today.  She is appealing to the company for Botox coverage through them.  She believes that is what she needs most.   She has also requested daily xanax for nighttime use to help her sleep.   Her husband's brother was found dead in his [place] apartment earlier this week.  They will leave tomorrow to fly to [place] for his funeral.  This is very stressful and sad for patient.  Her daughter is also spending a year in [place] - due to return home in June 2018.    HIT6:66 Number of days in the last 4 weeks with:  Severe headache: 10 Moderate headache: 5 Mild headache: 3  No headache: 10   Past Medical History:  Diagnosis Date  . Infertility, female   . Migraines     Social History   Social History  . Marital status: Married    Spouse name: N/A  . Number of children: N/A  . Years of education: N/A   Occupational History  .  [place]    [facility]   Social History Main Topics  . Smoking status: Former Smoker    Types: Cigarettes    Quit date: 02/09/1992  . Smokeless tobacco: Never Used  . Alcohol use Yes     Comment: 1-2 weekly  . Drug use: No  . Sexual activity: Yes    Partners: Male   Other Topics Concern  . Not on file   Social History Narrative  . No narrative on file    Family History  Problem Relation Age of Onset  . Hypertension Mother   . Allergic rhinitis Mother   . Cancer Maternal Grandmother   . Stroke    . Eczema Daughter     No Known Allergies  Current Outpatient Prescriptions on  File Prior to Visit  Medication Sig Dispense Refill  . ALPRAZolam (XANAX) 0.25 MG tablet Take 1 tablet at bedtime as needed for sleep 25 tablet 2  . Azelastine-Fluticasone (DYMISTA) 137-50 MCG/ACT SUSP Place 2 sprays into both nostrils 1 day or 1 dose. 1 Bottle 5  . baclofen (LIORESAL) 10 MG tablet Take 1 tablet (10 mg total) by mouth every 8 (eight) hours as needed. 60 each 3  . Carbinoxamine Maleate ER [facility](KARBINAL ER) 4 MG/5ML SUER Take 8 mg by mouth 2 (two) times daily. 480 mL 5  . celecoxib (CELEBREX) 200 MG capsule Take 1 capsule (200 mg total) by mouth 2 (two) times daily. 60 capsule 3  . cetirizine (ZYRTEC) 10 MG tablet Take 10 mg by mouth daily.    Marland Kitchen. doxycycline (VIBRAMYCIN) 100 MG capsule Take 1 capsule (100 mg total) by mouth 2 (two) times daily. 20 capsule 0  . HYDROcodone-acetaminophen (NORCO) 7.5-325 MG tablet Take 1 tablet by mouth every 6 (six) hours as needed. 30 tablet 0  . metoprolol succinate (TOPROL-XL) 50 MG 24 hr tablet Take 1 tablet (50 mg total) by mouth daily. 30 tablet 11  . naratriptan (AMERGE)  2.5 MG tablet Take 1 tablet (2.5 mg total) by mouth as needed. Take one (1) tablet at onset of headache; if returns or does not resolve, may repeat after 4 hours; do not exceed five (5) mg in 24 hours. 10 tablet 11  . ondansetron (ZOFRAN) 8 MG tablet Take 1 tablet (8 mg total) by mouth every 8 (eight) hours as needed. 20 tablet 3  . pantoprazole (PROTONIX) 40 MG tablet Take 1 tablet (40 mg total) by mouth daily. 90 tablet 3  . predniSONE (DELTASONE) 20 MG tablet Take 1 tablet (20 mg total) by mouth 2 (two) times daily with a meal. 10 tablet 0  . sertraline (ZOLOFT) 100 MG tablet Take 1 tablet (100 mg total) by mouth daily. 30 tablet 5  . sulfamethoxazole-trimethoprim (BACTRIM DS,SEPTRA DS) 800-160 MG tablet   3  . SUMAtriptan (IMITREX) 100 MG tablet Take 1 tablet (100 mg total) by mouth once as needed for migraine. 9 tablet 11  . tiZANidine (ZANAFLEX) 4 MG tablet Take 1 tablet (4 mg  total) by mouth 3 (three) times daily. 90 tablet 2  . topiramate (TOPAMAX) 50 MG tablet Take 1 tablet (50 mg total) by mouth 3 (three) times daily. 90 tablet 11  . traZODone (DESYREL) 50 MG tablet Take 0.5-1 tablets (25-50 mg total) by mouth at bedtime as needed. for sleep 90 tablet 1  . chlorproMAZINE (THORAZINE) 25 MG tablet Take 1 tablet (25 mg total) by mouth 3 (three) times daily. (Patient not taking: Reported on 12/15/2016) 30 tablet 0  . NONFORMULARY OR COMPOUNDED ITEM Dry needling  Dx migraines, L hip pain 1 each 0  . NONFORMULARY OR COMPOUNDED ITEM Dry needling  Dx Migraine, L hip pain 1 each 0   No current facility-administered medications on file prior to visit.      Review of Systems:  All pertinent positive/negative included in HPI, all other review of systems are negative   Objective:  Physical Exam BP 118/82 (BP Location: Left Arm, Patient Position: Sitting, Cuff Size: Normal)   Pulse 62   Resp 18   Ht 5\' 5"  (1.651 m)   Wt 173 lb (78.5 kg)   BMI 28.79 kg/m  CONSTITUTIONAL: Well-developed, well-nourished female in no acute distress.  EYES: EOM intact ENT: Normocephalic CARDIOVASCULAR: Regular rate and rhythm with no adventitious sounds.  RESPIRATORY: Normal rate. Clear to auscultation bilaterally  MUSCULOSKELETAL: Normal ROM, strength equal bilaterally, significant muscle spasm noted SKIN: Warm, dry without erythema  NEUROLOGICAL: Alert, oriented, CN II-XII grossly intact, Appropriate balance   PSYCH: Normal behavior, mood   Assessment & Plan:  Assessment: Muscle spasm - worse Migraine HA - worse  Plan: Increase daily Topamax to 200mg  Other meds refilled.  Daily xanax approved this time but will work toward decreasing again as stress improves.   Follow-up in 3 months or sooner PRN  Bertram [place], PA-C 12/15/2016 9:41 AM

## 2016-12-21 MED FILL — CLOTRIMAZOLE 10 MG TROCHE: 10 | 7 days supply | Qty: 35 | Fill #0

## 2016-12-21 MED FILL — FLUCONAZOLE 150 MG TABLET: 150 | 3 days supply | Qty: 2 | Fill #0

## 2017-01-01 MED FILL — METOPROLOL SUCC ER 50 MG TA: 50 | 30 days supply | Qty: 30 | Fill #5

## 2017-01-15 DIAGNOSIS — R0789 Other chest pain: Secondary | ICD-10-CM | POA: Diagnosis not present

## 2017-01-15 DIAGNOSIS — R0609 Other forms of dyspnea: Secondary | ICD-10-CM | POA: Diagnosis not present

## 2017-01-16 MED FILL — CELECOXIB 200 MG CAP: 200 | 30 days supply | Qty: 60 | Fill #3

## 2017-01-16 MED FILL — TOPIRAMATE 200 MG TABLET: 200 | 30 days supply | Qty: 30 | Fill #1

## 2017-01-16 MED FILL — SERTRALINE HCL 100 MG TAB: 100 | 30 days supply | Qty: 30 | Fill #1

## 2017-01-17 ENCOUNTER — Encounter: Payer: Self-pay | Admitting: *Deleted

## 2017-01-17 ENCOUNTER — Ambulatory Visit (INDEPENDENT_AMBULATORY_CARE_PROVIDER_SITE_OTHER): Payer: 59 | Admitting: Family Medicine

## 2017-01-17 ENCOUNTER — Encounter: Payer: Self-pay | Admitting: Family Medicine

## 2017-01-17 VITALS — BP 110/76 | HR 59 | Temp 97.6°F | Ht 65.0 in | Wt 168.0 lb

## 2017-01-17 DIAGNOSIS — R0789 Other chest pain: Secondary | ICD-10-CM

## 2017-01-17 DIAGNOSIS — R0609 Other forms of dyspnea: Secondary | ICD-10-CM

## 2017-01-17 LAB — COMPREHENSIVE METABOLIC PANEL
ALT: 21 U/L (ref 0–35)
AST: 19 U/L (ref 0–37)
Albumin: 4.6 g/dL (ref 3.5–5.2)
Alkaline Phosphatase: 51 U/L (ref 39–117)
BUN: 14 mg/dL (ref 6–23)
CALCIUM: 9.3 mg/dL (ref 8.4–10.5)
CHLORIDE: 110 meq/L (ref 96–112)
CO2: 22 meq/L (ref 19–32)
CREATININE: 0.85 mg/dL (ref 0.40–1.20)
GFR: 73.1 mL/min (ref >60.00)
Glucose, Bld: 97 mg/dL (ref 70–99)
Potassium: 4.1 mEq/L (ref 3.5–5.1)
SODIUM: 140 meq/L (ref 135–145)
Total Bilirubin: 0.4 mg/dL (ref 0.2–1.2)
Total Protein: 7.6 g/dL (ref 6.0–8.3)

## 2017-01-17 LAB — CBC
HCT: 41.2 % (ref 36.0–46.0)
Hemoglobin: 14.2 g/dL (ref 12.0–15.0)
MCHC: 34.4 g/dL (ref 30.0–36.0)
MCV: 97.5 fl (ref 78.0–100.0)
PLATELETS: 403 10*3/uL — AB (ref 150.0–400.0)
RBC: 4.23 Mil/uL (ref 3.87–5.11)
RDW: 12.4 % (ref 11.5–15.5)
WBC: 7.4 10*3/uL (ref 4.0–10.5)

## 2017-01-17 NOTE — Patient Instructions (Signed)
If you start having chest pain associated with physical activity, difficulty breathing or general worsening of symptoms, seek emergent care.

## 2017-01-17 NOTE — Progress Notes (Signed)
Chief Complaint  Patient presents with  . Chest Pain    pt was seen at Urgent Care on yest and had a abnormal EKG(pt brought in EKG)    Subjective: Patient is a 56 y.o. female here for chest pain.  5/10 central chest pressure that does not radiate. Usually lasts around 5-10 min at a time. Started around 1 mo ago, has happened 6-7 times since then. She will sit down and take deep breaths when it happens. his tends to help. Nothing she notices makes it worse. Associated SOB. Denies jaw pain, arm pain, vision changes, numbness, tingling, or nausea/vomiting. She has a 15-pack-year history of smoking, but quit in 1993. Mother had high blood pressure, but no other significant family history of heart disease. She does not have any personal history of heart disease.  Of note, she also expressed worsening shortness of breath when going up stairs. This is also change in the past month. She denies any blood in her stool, dark tarry stools, vomiting, or new areas of easy bruising/bleeding.  ROS: Heart: Denies current chest pain, no palpitations Lungs: Denies current SOB   Family History  Problem Relation Age of Onset  . Hypertension Mother   . Allergic rhinitis Mother   . Cancer Maternal Grandmother   . Stroke    . Eczema Daughter    Past Medical History:  Diagnosis Date  . Infertility, female   . Migraines    No Known Allergies  Current Outpatient Prescriptions:  .  ALPRAZolam (XANAX) 0.25 MG tablet, Take 1 tablet at bedtime as needed for sleep, Disp: 30 tablet, Rfl: 2 .  Azelastine-Fluticasone (DYMISTA) 137-50 MCG/ACT SUSP, Place 2 sprays into both nostrils 1 day or 1 dose., Disp: 1 Bottle, Rfl: 5 .  baclofen (LIORESAL) 10 MG tablet, Take 1 tablet (10 mg total) by mouth every 8 (eight) hours as needed., Disp: 60 each, Rfl: 3 .  Carbinoxamine Maleate ER [facility](KARBINAL ER) 4 MG/5ML SUER, Take 8 mg by mouth 2 (two) times daily., Disp: 480 mL, Rfl: 5 .  celecoxib (CELEBREX) 200 MG capsule, Take 1  capsule (200 mg total) by mouth 2 (two) times daily., Disp: 60 capsule, Rfl: 3 .  cetirizine (ZYRTEC) 10 MG tablet, Take 10 mg by mouth daily., Disp: , Rfl:  .  chlorproMAZINE (THORAZINE) 25 MG tablet, Take 1 tablet (25 mg total) by mouth 3 (three) times daily., Disp: 30 tablet, Rfl: 0 .  HYDROcodone-acetaminophen (NORCO) 7.5-325 MG tablet, Take 1 tablet by mouth every 6 (six) hours as needed., Disp: 30 tablet, Rfl: 0 .  metoprolol succinate (TOPROL-XL) 50 MG 24 hr tablet, Take 1 tablet (50 mg total) by mouth daily., Disp: 30 tablet, Rfl: 11 .  naratriptan (AMERGE) 2.5 MG tablet, Take 1 tablet (2.5 mg total) by mouth as needed. Take one (1) tablet at onset of headache; if returns or does not resolve, may repeat after 4 hours; do not exceed five (5) mg in 24 hours., Disp: 10 tablet, Rfl: 11 .  NONFORMULARY OR COMPOUNDED ITEM, Dry needling  Dx migraines, L hip pain, Disp: 1 each, Rfl: 0 .  NONFORMULARY OR COMPOUNDED ITEM, Dry needling  Dx Migraine, L hip pain, Disp: 1 each, Rfl: 0 .  ondansetron (ZOFRAN) 8 MG tablet, Take 1 tablet (8 mg total) by mouth every 8 (eight) hours as needed., Disp: 20 tablet, Rfl: 3 .  pantoprazole (PROTONIX) 40 MG tablet, Take 1 tablet (40 mg total) by mouth daily., Disp: 90 tablet, Rfl: 3 .  sertraline (ZOLOFT) 100 MG tablet, Take 1 tablet (100 mg total) by mouth daily., Disp: 30 tablet, Rfl: 10 .  sulfamethoxazole-trimethoprim (BACTRIM DS,SEPTRA DS) 800-160 MG tablet, , Disp: , Rfl: 3 .  SUMAtriptan (IMITREX) 100 MG tablet, Take 1 tablet (100 mg total) by mouth once as needed for migraine., Disp: 9 tablet, Rfl: 11 .  tiZANidine (ZANAFLEX) 4 MG tablet, Take 1 tablet (4 mg total) by mouth 3 (three) times daily., Disp: 90 tablet, Rfl: 2 .  topiramate (TOPAMAX) 200 MG tablet, Take 1 tablet (200 mg total) by mouth daily., Disp: 30 tablet, Rfl: 11 .  traZODone (DESYREL) 50 MG tablet, Take 0.5-1 tablets (25-50 mg total) by mouth at bedtime as needed. for sleep, Disp: 90 tablet,  Rfl: 1  Objective: BP 110/76 (BP Location: Left Arm, Patient Position: Sitting, Cuff Size: Normal)   Pulse (!) 59   Temp 97.6 F (36.4 C) (Oral)   Ht 5\' 5"  (1.651 m)   Wt 168 lb (76.2 kg)   SpO2 98%   BMI 27.96 kg/m  General: Awake, appears stated age HEENT: MMM, EOMi, PERRLA Heart: RRR, no murmurs, no bruits, no LE edema Lungs: CTAB, no rales, wheezes or rhonchi. No accessory muscle use Abd: BS+, soft, NT, ND, no masses or organomegaly MSK: Chest pain is not reproducible to palpation Psych: Age appropriate judgment and insight, normal affect and mood  Assessment and Plan: Chest pressure - Plan: Comprehensive metabolic panel, Ambulatory referral to Cardiology  Dyspnea on exertion - Plan: CBC, Comprehensive metabolic panel  EKG reviewed. Chest pain is not exertional, however the pressure and duration are somewhat concerning. Will refer to cardiology for potential stress test. Other orders as above, could be related to anemia. She is going out of the country in 8 days. She would like it done before then. If she starts having exertional chest pain, trouble breathing, or general worsening of symptoms, seek emergent care. Follow-up pending the above. The patient voiced understanding and agreement to the plan.  Jilda Roche [place], DO 01/17/17  12:15 PM

## 2017-01-22 MED FILL — ALPRAZolam 0.25 MG TABS: 0.25 | 30 days supply | Qty: 30 | Fill #1

## 2017-01-25 MED FILL — METOPROLOL SUCC ER 50 MG TA: 50 | 30 days supply | Qty: 30 | Fill #6

## 2017-02-07 ENCOUNTER — Ambulatory Visit (INDEPENDENT_AMBULATORY_CARE_PROVIDER_SITE_OTHER): Payer: 59 | Admitting: Cardiovascular Disease

## 2017-02-07 ENCOUNTER — Encounter: Payer: Self-pay | Admitting: Cardiovascular Disease

## 2017-02-07 VITALS — BP 168/102 | HR 73 | Ht 65.0 in | Wt 170.6 lb

## 2017-02-07 DIAGNOSIS — R0602 Shortness of breath: Secondary | ICD-10-CM

## 2017-02-07 DIAGNOSIS — I10 Essential (primary) hypertension: Secondary | ICD-10-CM

## 2017-02-07 DIAGNOSIS — Z13220 Encounter for screening for lipoid disorders: Secondary | ICD-10-CM

## 2017-02-07 DIAGNOSIS — R079 Chest pain, unspecified: Secondary | ICD-10-CM | POA: Diagnosis not present

## 2017-02-07 MED ORDER — AMLODIPINE BESYLATE 5 MG PO TABS
5.0000 mg | ORAL_TABLET | Freq: Every day | ORAL | 5 refills | Status: DC
Start: 2017-02-07 — End: 2017-02-07

## 2017-02-07 MED ORDER — AMLODIPINE BESYLATE 5 MG PO TABS: 5.0000 mg | ORAL_TABLET | Freq: Every day | ORAL | 1 refills | Status: DC

## 2017-02-07 MED FILL — AMLODIPINE BESYLATE 5 MG TA: 5 | 90 days supply | Qty: 90 | Fill #0

## 2017-02-07 NOTE — Progress Notes (Signed)
Cardiology Office Note   Date:  02/07/2017   ID:  Alexandria, [name] 26-Dec-1960, MRN [id number]  PCP:  Donato Schultz, DO  Cardiologist:   Chilton Si, MD   Chief Complaint  Patient presents with  . New Patient (Initial Visit)  . Shortness of Breath    when walking up stairs  . Chest Pain    tighteness  . Dizziness    occasionally.      History of Present Illness: [name] is a 56 y.o. female who presents for an evaluation of chest pain.  [name] saw Dr. Arva Chafe on 01/17/17 and reported chest pain.  EKG revealed sinus bradycardia with a rate of 45 bpm. There are also anterior T-wave inversions concerning for ischemia.  [name] symptoms have been ongoing for a couple months.  She first noticed it on a flight to [place]. She had 6-7/10 substernal chest pressure that was associated with shortness of breath.  She took four baby aspirin and the symptoms improved within an hour.  It occurred a couple more times while on [name] trip and has occurred since returning home.  It typically doesn't occur with exertion and is unrelated to [name] diet.  While there she was walking in very hilly terrain and didn't have any chest pain with that activity.  There was no associated nausea or diaphoresis.  She has noted some shortness of breath with exertion, but she has attributed this to weight and being out of shape. .  Several years ago she had lower extremity edema on a flight but since wearing compression socks this has not recurred.  She denies orthopnea or PND.     [name] reports episodes of lightheadedness.  She works as a Clinical biochemist at [employer].  She checks [name] BP when this occurs and it has been high.  For the last three months she has been taking Sudafed daily.  She also drinks one bottle of wine 6 days per week.  She continues to have hot flashes and night sweats that she attributes to menopause.    Past Medical History:  Diagnosis Date  . Infertility, female     . Migraines     Past Surgical History:  Procedure Laterality Date  . ANKLE SURGERY     Left  . SHOULDER SURGERY     Left     Current Outpatient Prescriptions  Medication Sig Dispense Refill  . ALPRAZolam (XANAX) 0.25 MG tablet Take 1 tablet at bedtime as needed for sleep 30 tablet 2  . aspirin EC 81 MG tablet Take 81 mg by mouth daily.    . Azelastine-Fluticasone (DYMISTA) 137-50 MCG/ACT SUSP Place 2 sprays into both nostrils 1 day or 1 dose. 1 Bottle 5  . baclofen (LIORESAL) 10 MG tablet Take 1 tablet (10 mg total) by mouth every 8 (eight) hours as needed. 60 each 3  . Carbinoxamine Maleate ER [facility]) 4 MG/5ML SUER Take 8 mg by mouth 2 (two) times daily. 480 mL 5  . celecoxib (CELEBREX) 200 MG capsule Take 1 capsule (200 mg total) by mouth 2 (two) times daily. 60 capsule 3  . cetirizine (ZYRTEC) 10 MG tablet Take 10 mg by mouth daily.    . chlorproMAZINE (THORAZINE) 25 MG tablet Take 1 tablet (25 mg total) by mouth 3 (three) times daily. 30 tablet 0  . HYDROcodone-acetaminophen (NORCO) 7.5-325 MG tablet Take 1 tablet by mouth every 6 (six) hours as needed. 30  tablet 0  . ibuprofen (ADVIL,MOTRIN) 800 MG tablet Take 800 mg by mouth every 8 (eight) hours as needed.    . naratriptan (AMERGE) 2.5 MG tablet Take 1 tablet (2.5 mg total) by mouth as needed. Take one (1) tablet at onset of headache; if returns or does not resolve, may repeat after 4 hours; do not exceed five (5) mg in 24 hours. 10 tablet 11  . NONFORMULARY OR COMPOUNDED ITEM Dry needling  Dx migraines, L hip pain 1 each 0  . NONFORMULARY OR COMPOUNDED ITEM Dry needling  Dx Migraine, L hip pain 1 each 0  . ondansetron (ZOFRAN) 8 MG tablet Take 1 tablet (8 mg total) by mouth every 8 (eight) hours as needed. 20 tablet 3  . pantoprazole (PROTONIX) 40 MG tablet Take 1 tablet (40 mg total) by mouth daily. 90 tablet 3  . Pseudoephedrine-DM-GG (SUDAFED COUGH PO) Take by mouth.    . sertraline (ZOLOFT) 100 MG tablet Take 1  tablet (100 mg total) by mouth daily. 30 tablet 10  . sulfamethoxazole-trimethoprim (BACTRIM DS,SEPTRA DS) 800-160 MG tablet   3  . SUMAtriptan (IMITREX) 100 MG tablet Take 1 tablet (100 mg total) by mouth once as needed for migraine. 9 tablet 11  . tiZANidine (ZANAFLEX) 4 MG tablet Take 1 tablet (4 mg total) by mouth 3 (three) times daily. 90 tablet 2  . topiramate (TOPAMAX) 200 MG tablet Take 1 tablet (200 mg total) by mouth daily. 30 tablet 11  . traZODone (DESYREL) 50 MG tablet Take 0.5-1 tablets (25-50 mg total) by mouth at bedtime as needed. for sleep 90 tablet 1  . amLODipine (NORVASC) 5 MG tablet Take 1 tablet (5 mg total) by mouth daily. 90 tablet 1   No current facility-administered medications for this visit.     Allergies:   Patient has no known allergies.    Social History:  The patient  reports that she quit smoking about 25 years ago. [name] smoking use included Cigarettes. She has never used smokeless tobacco. She reports that she drinks alcohol. She reports that she does not use drugs.   Family History:  The patient's family history includes Alcohol abuse in [name] father and paternal grandfather; Allergic rhinitis in [name] mother; Cancer in [name] maternal grandmother; Eczema in [name] daughter; Heart disease in [name] maternal grandfather; Hypertension in [name] mother; Stroke in [name] father.    ROS:  Please see the history of present illness.   Otherwise, review of systems are positive for none.   All other systems are reviewed and negative.    PHYSICAL EXAM: VS:  BP (!) 168/102   Pulse 73   Ht 5\' 5"  (1.651 m)   Wt 77.4 kg (170 lb 9.6 oz)   BMI 28.39 kg/m  , BMI Body mass index is 28.39 kg/m. GENERAL:  Well appearing HEENT:  Pupils equal round and reactive, fundi not visualized, oral mucosa unremarkable NECK:  No jugular venous distention, waveform within normal limits, carotid upstroke brisk and symmetric, no bruits, no thyromegaly LYMPHATICS:  No cervical adenopathy LUNGS:  Clear  to auscultation bilaterally HEART:  RRR.  PMI not displaced or sustained,S1 and S2 within normal limits, no S3, no S4, no clicks, no rubs, no murmurs ABD:  Flat, positive bowel sounds normal in frequency in pitch, no bruits, no rebound, no guarding, no midline pulsatile mass, no hepatomegaly, no splenomegaly EXT:  2 plus pulses throughout, no edema, no cyanosis no clubbing SKIN:  No rashes no nodules NEURO:  Cranial  nerves II through XII grossly intact, motor grossly intact throughout [facility]:  Cognitively intact, oriented to person place and time    EKG:  EKG is ordered today. 01/15/17: sinus bradycardia. Rate 45 bpm. Anterior T-wave inversions.   Recent Labs: 01/17/2017: ALT 21; BUN 14; Creatinine, Ser 0.85; Hemoglobin 14.2; Platelets 403.0; Potassium 4.1; Sodium 140    Lipid Panel No results found for: CHOL, TRIG, HDL, CHOLHDL, VLDL, LDLCALC, LDLDIRECT    Wt Readings from Last 3 Encounters:  02/07/17 77.4 kg (170 lb 9.6 oz)  01/17/17 76.2 kg (168 lb)  12/15/16 78.5 kg (173 lb)      ASSESSMENT AND PLAN:  # Chest pain: # Shortness of breath: Symptoms are concerning for ischemia given [name] EKG findings.  However, she has no exertional component, which makes it more atypical.  We will obtain an exercise Myoview to better assess.  Continue aspirin 81 mg daily.  Check lipids.  We will also check a d-dimer due to [name] travel history.  # Bradycardia: # Dizziness:  Stop metoprolol.  Heart rate was 45 bpm on EKG.  # Hypertension: BP elevated today.  It has generally been well-controlled.  We will stop metoprolol due to bradycardia as above.  Start amlodipine 5mg  daily.  Stop Sudafed.  Recommended reducing alcohol intake to no more than 1 glass per day.  If [name] BP remains labile we will check a renal artery ultrasound.   # Alcohol: Recommended limiting alcohol intake to no more than one glass per day.   Current medicines are reviewed at length with the patient today.  The patient does not  have concerns regarding medicines.  The following changes have been made:  Stop metoprolol and start amlodipine  Labs/ tests ordered today include:   Orders Placed This Encounter  Procedures  . Lipid panel  . D-Dimer, Quantitative  . Myocardial Perfusion Imaging     Disposition:   FU with Makya Phillis C. Duke Salvia, MD, [facility] in 1 month    This note was written with the assistance of speech recognition software.  Please excuse any transcriptional errors.  Signed, Reilly Molchan C. Duke Salvia, MD, [facility]  02/07/2017 4:07 PM    [facility]

## 2017-02-07 NOTE — Patient Instructions (Addendum)
Medication Instructions:  STOP METOPROLOL   START AMLODIPINE 5 MG DAILY   STOP TAKING SUDAFED DAILY   Labwork: FASTING LP/D-DIMER SOON  Testing/Procedures: Your physician has requested that you have en exercise stress myoview. For further information please visit [email or web address]. Please follow instruction sheet, as given. 2 DAY STUDY   Follow-Up: Your physician recommends that you schedule a follow-up appointment in: 1 MONTH   Any Other Special Instructions Will Be Listed Below (If Applicable). MONITOR YOUR BLOOD PRESSURE AT HOME AND BRING READINGS TO YOUR FOLLOW UP VISIT   If you need a refill on your cardiac medications before your next appointment, please call your pharmacy.

## 2017-02-08 DIAGNOSIS — R079 Chest pain, unspecified: Secondary | ICD-10-CM | POA: Diagnosis not present

## 2017-02-08 DIAGNOSIS — R0602 Shortness of breath: Secondary | ICD-10-CM | POA: Diagnosis not present

## 2017-02-08 DIAGNOSIS — Z13220 Encounter for screening for lipoid disorders: Secondary | ICD-10-CM | POA: Diagnosis not present

## 2017-02-09 ENCOUNTER — Encounter: Payer: Self-pay | Admitting: Physician Assistant

## 2017-02-09 ENCOUNTER — Telehealth: Payer: Self-pay | Admitting: Cardiovascular Disease

## 2017-02-09 ENCOUNTER — Encounter: Payer: Self-pay | Admitting: Cardiovascular Disease

## 2017-02-09 DIAGNOSIS — M542 Cervicalgia: Secondary | ICD-10-CM

## 2017-02-09 MED ORDER — HYDROCODONE-ACETAMINOPHEN 7.5-325 MG PO TABS: 1.0000 | ORAL_TABLET | Freq: Four times a day (QID) | ORAL | 0 refills | Status: DC | PRN

## 2017-02-09 NOTE — Telephone Encounter (Signed)
Pt requesting additional Hydrocodone.   4 tablets provided.  Pt will need to make this last until next appt (currently scheduled end of April).

## 2017-02-09 NOTE — Telephone Encounter (Signed)
Returned call to patient she was calling to get results form lab done yesterday.Advised results not available at present.I will send message to Dr.Flomaton's nurse Juliette AlcideMelinda.

## 2017-02-09 NOTE — Telephone Encounter (Signed)
Spoke with patient Labs faxed D-dimer WNL  Will have labs scanned in Epic so Dr Duke Salviaandolph can review

## 2017-02-09 NOTE — Telephone Encounter (Signed)
Pt would like her lab results from yesterday please. °

## 2017-02-15 ENCOUNTER — Telehealth (HOSPITAL_COMMUNITY): Payer: Self-pay

## 2017-02-15 NOTE — Telephone Encounter (Signed)
Encounter complete. 

## 2017-02-16 ENCOUNTER — Other Ambulatory Visit: Payer: Self-pay | Admitting: Allergy and Immunology

## 2017-02-16 ENCOUNTER — Telehealth: Payer: Self-pay | Admitting: Cardiovascular Disease

## 2017-02-16 DIAGNOSIS — J310 Chronic rhinitis: Secondary | ICD-10-CM

## 2017-02-16 DIAGNOSIS — R05 Cough: Secondary | ICD-10-CM

## 2017-02-16 DIAGNOSIS — R059 Cough, unspecified: Secondary | ICD-10-CM

## 2017-02-16 MED FILL — SERTRALINE HCL 100 MG TAB: 100 | 30 days supply | Qty: 30 | Fill #2

## 2017-02-16 MED FILL — PANTOPRAZOLE SOD DR 40 MG T: 40 | 90 days supply | Qty: 90 | Fill #2

## 2017-02-16 MED FILL — TOPIRAMATE 200 MG TABLET: 200 | 30 days supply | Qty: 30 | Fill #2

## 2017-02-16 NOTE — Telephone Encounter (Signed)
See result note.  

## 2017-02-16 NOTE — Telephone Encounter (Signed)
New message ° ° ° ° ° ° °Calling to get lab results °

## 2017-02-20 ENCOUNTER — Ambulatory Visit (HOSPITAL_COMMUNITY)
Admission: RE | Admit: 2017-02-20 | Discharge: 2017-02-20 | Disposition: A | Discharge status: Home / Self Care | Payer: 59 | Source: Ambulatory Visit | Attending: Cardiovascular Disease | Admitting: Cardiovascular Disease

## 2017-02-20 DIAGNOSIS — R0602 Shortness of breath: Secondary | ICD-10-CM | POA: Diagnosis not present

## 2017-02-20 DIAGNOSIS — R079 Chest pain, unspecified: Secondary | ICD-10-CM

## 2017-02-20 MED ORDER — TECHNETIUM TC 99M TETROFOSMIN IV KIT
29.0000 | PACK | Freq: Once | INTRAVENOUS | Status: AC | PRN
  Administered 2017-02-20: 29 via INTRAVENOUS
  Filled 2017-02-20: qty 29

## 2017-02-21 ENCOUNTER — Ambulatory Visit (HOSPITAL_COMMUNITY)
Admission: RE | Admit: 2017-02-21 | Discharge: 2017-02-21 | Disposition: A | Discharge status: Home / Self Care | Payer: 59 | Source: Ambulatory Visit | Attending: Cardiovascular Disease | Admitting: Cardiovascular Disease

## 2017-02-21 LAB — MYOCARDIAL PERFUSION IMAGING
CSEPEW: 10.2 METS
CSEPPHR: 160 {beats}/min
Exercise duration (min): 9 min
Exercise duration (sec): 4 s
LV dias vol: 75 mL (ref 46–106)
LV sys vol: 36 mL
MPHR: 164 {beats}/min
Percent HR: 98 %
RPE: 18
Rest HR: 75 {beats}/min
SDS: 4
SRS: 7
SSS: 11
TID: 0.99

## 2017-02-21 MED ORDER — TECHNETIUM TC 99M TETROFOSMIN IV KIT
29.3000 | PACK | Freq: Once | INTRAVENOUS | Status: AC | PRN
  Administered 2017-02-21: 29.3 via INTRAVENOUS

## 2017-03-01 ENCOUNTER — Telehealth: Payer: Self-pay | Admitting: Cardiovascular Disease

## 2017-03-01 ENCOUNTER — Encounter: Payer: Self-pay | Admitting: Cardiovascular Disease

## 2017-03-01 DIAGNOSIS — E7800 Pure hypercholesterolemia, unspecified: Secondary | ICD-10-CM

## 2017-03-01 DIAGNOSIS — Z5181 Encounter for therapeutic drug level monitoring: Secondary | ICD-10-CM

## 2017-03-01 MED ORDER — ROSUVASTATIN CALCIUM 40 MG PO TABS: 40.0000 mg | ORAL_TABLET | Freq: Every day | ORAL | 2 refills | Status: DC

## 2017-03-01 NOTE — Telephone Encounter (Signed)
Left message and released in mychart with Dr Leonides Sakeandolph's comments

## 2017-03-01 NOTE — Telephone Encounter (Signed)
New message ° ° ° °Pt is returning call to Melinda °

## 2017-03-01 NOTE — Telephone Encounter (Signed)
-----   Message from Chilton Siiffany [place], MD sent at 02/27/2017  5:12 PM EDT ----- Recommend that she start rosuvastatin 40 mg daily.  Repeat lipids and CMP in 6 weeks.

## 2017-03-02 ENCOUNTER — Other Ambulatory Visit: Payer: Self-pay | Admitting: *Deleted

## 2017-03-02 MED ORDER — ROSUVASTATIN CALCIUM 40 MG PO TABS: 40.0000 mg | ORAL_TABLET | Freq: Every day | ORAL | 2 refills | Status: DC

## 2017-03-04 NOTE — Progress Notes (Signed)
Cardiology Office Note   Date:  03/05/2017   ID:  Alexandria, [name] 1961/09/03, MRN [id number]  PCP:  Alexandria Schultz, DO  Cardiologist:   Chilton Si, MD   No chief complaint on file.     History of Present Illness: [name] is a 56 y.o. female with hypertension who presents for follow up.  She was first seen 01/2017 for an evaluation of chest pain and an abnormal EKG.  She was referred for an Lexiscan Myoview that revealed LVEF 53% and no ischemia. At her last appointment she also reported dizziness and her heart rate was noted to be 45 bpm.  Metoprolol was switched to amlodipine.  She reported high blood pressures and it was recommended that she reduce alcohol and Sudafed use.  Based on her lipid profile she was started on rosuvastatin 40 mg daily.  Since her last appointment she has been feeling well. She brings a log of her blood pressures that showed that in the morning her systolic blood pressures in the 100s to 120s in the evening and is 120 to 140s. She typically takes amlodipine in the evenings. She is no longer experiencing chest pain or pressure. She continues to have mild lightheadedness but it is much better and stopping metoprolol. She does note some occasional shortness of breath with exertion, but she attributes this to being out of shape. Since her last appointment she has started walking for exercise regularly. She has no chest pain or pressure with exertion. She denies lower extremity edema, orthopnea, or PND.  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     Past Medical History:  Diagnosis Date  . Dizziness 03/05/2017  . Essential hypertension 03/05/2017  . Hyperlipidemia 03/05/2017  . Infertility, female   . Migraines     Past Surgical History:  Procedure Laterality Date  . ANKLE SURGERY     Left  . SHOULDER SURGERY     Left     Current Outpatient Prescriptions  Medication Sig Dispense Refill  . ALPRAZolam (XANAX) 0.25 MG tablet  Take 1 tablet at bedtime as needed for sleep 30 tablet 2  . amLODipine (NORVASC) 5 MG tablet Take 0.5 tablets (2.5 mg total) by mouth 2 (two) times daily. 90 tablet 1  . aspirin EC 81 MG tablet Take 81 mg by mouth daily.    . Azelastine-Fluticasone (DYMISTA) 137-50 MCG/ACT SUSP Place 2 sprays into both nostrils 1 day or 1 dose. 1 Bottle 5  . baclofen (LIORESAL) 10 MG tablet Take 1 tablet (10 mg total) by mouth every 8 (eight) hours as needed. 60 each 3  . celecoxib (CELEBREX) 200 MG capsule Take 1 capsule (200 mg total) by mouth 2 (two) times daily. 60 capsule 3  . cetirizine (ZYRTEC) 10 MG tablet Take 10 mg by mouth daily.    . chlorproMAZINE (THORAZINE) 25 MG tablet Take 1 tablet (25 mg total) by mouth 3 (three) times daily. 30 tablet 0  . HYDROcodone-acetaminophen (NORCO) 7.5-325 MG tablet Take 1 tablet by mouth every 6 (six) hours as needed. 4 tablet 0  . ibuprofen (ADVIL,MOTRIN) 800 MG tablet Take 800 mg by mouth every 8 (eight) hours as needed.    Marland Kitchen KARBINAL ER 4 MG/5ML SUER TAKE (8MG ) BY MOUTH TWICE DAILY 480 mL 0  . naratriptan (AMERGE) 2.5 MG tablet Take 1 tablet (2.5 mg total) by mouth as needed. Take one (1) tablet at onset of headache; if returns  or does not resolve, may repeat after 4 hours; do not exceed five (5) mg in 24 hours. 10 tablet 11  . NONFORMULARY OR COMPOUNDED ITEM Dry needling  Dx Migraine, L hip pain 1 each 0  . ondansetron (ZOFRAN) 8 MG tablet Take 1 tablet (8 mg total) by mouth every 8 (eight) hours as needed. 20 tablet 3  . pantoprazole (PROTONIX) 40 MG tablet Take 1 tablet (40 mg total) by mouth daily. 90 tablet 3  . Pseudoephedrine-DM-GG (SUDAFED COUGH PO) Take by mouth.    . rosuvastatin (CRESTOR) 40 MG tablet Take 1 tablet (40 mg total) by mouth daily. 30 tablet 2  . sertraline (ZOLOFT) 100 MG tablet Take 1 tablet (100 mg total) by mouth daily. 30 tablet 10  . sulfamethoxazole-trimethoprim (BACTRIM DS,SEPTRA DS) 800-160 MG tablet   3  . SUMAtriptan (IMITREX)  100 MG tablet Take 1 tablet (100 mg total) by mouth once as needed for migraine. 9 tablet 11  . tiZANidine (ZANAFLEX) 4 MG tablet Take 1 tablet (4 mg total) by mouth 3 (three) times daily. 90 tablet 2  . topiramate (TOPAMAX) 200 MG tablet Take 1 tablet (200 mg total) by mouth daily. 30 tablet 11  . traZODone (DESYREL) 50 MG tablet Take 0.5-1 tablets (25-50 mg total) by mouth at bedtime as needed. for sleep 90 tablet 1   No current facility-administered medications for this visit.     Allergies:   Patient has no known allergies.    Social History:  The patient  reports that she quit smoking about 25 years ago. Her smoking use included Cigarettes. She has never used smokeless tobacco. She reports that she drinks alcohol. She reports that she does not use drugs.   Family History:  The patient's family history includes Alcohol abuse in her father and paternal grandfather; Allergic rhinitis in her mother; Cancer in her maternal grandmother; Eczema in her daughter; Heart disease in her maternal grandfather; Hypertension in her mother; Stroke in her father.    ROS:  Please see the history of present illness.   Otherwise, review of systems are positive for none.   All other systems are reviewed and negative.    PHYSICAL EXAM: VS:  BP 110/62   Pulse 68   Ht 5\' 5"  (1.651 m)   Wt 78.1 kg (172 lb 3.2 oz)   BMI 28.66 kg/m  , BMI Body mass index is 28.66 kg/m. GENERAL:  Well appearing HEENT:  Pupils equal round and reactive, fundi not visualized, oral mucosa unremarkable NECK:  No jugular venous distention, waveform within normal limits, carotid upstroke brisk and symmetric, no bruits LYMPHATICS:  No cervical adenopathy LUNGS:  Clear to auscultation bilaterally HEART:  RRR.  PMI not displaced or sustained,S1 and S2 within normal limits, no S3, no S4, no clicks, no rubs, no murmurs ABD:  Flat, positive bowel sounds normal in frequency in pitch, no bruits, no rebound, no guarding, no midline  pulsatile mass, no hepatomegaly, no splenomegaly EXT:  2 plus pulses throughout, no edema, no cyanosis no clubbing SKIN:  No rashes no nodules NEURO:  Cranial nerves II through XII grossly intact, motor grossly intact throughout PSYCH:  Cognitively intact, oriented to person place and time   EKG:  EKG is not ordered today. 01/15/17: sinus bradycardia. Rate 45 bpm. Anterior T-wave inversions.  Exercise Myoview 02/20/17: Nuclear stress EF: 53%.  The left ventricular ejection fraction is mildly decreased (45-54%) / lower limit of normal  There was no ST segment deviation noted  during stress.  The study is normal.  This is a low risk study.   Recent Labs: 01/17/2017: ALT 21; BUN 14; Creatinine, Ser 0.85; Hemoglobin 14.2; Platelets 403.0; Potassium 4.1; Sodium 140    Lipid Panel No results found for: CHOL, TRIG, HDL, CHOLHDL, VLDL, LDLCALC, LDLDIRECT    Wt Readings from Last 3 Encounters:  03/05/17 78.1 kg (172 lb 3.2 oz)  02/20/17 77.1 kg (170 lb)  02/07/17 77.4 kg (170 lb 9.6 oz)      ASSESSMENT AND PLAN:  # Chest pain: # Shortness of breath: Chest pain has resolved and exercise Myoview was negative for ischemia. She does have some shortness of breath, but this seems most attributable to being out of shape. She was encouraged to continue her exercise.  # Bradycardia: # Dizziness:  Improved since stopping metoprolol.  # Hypertension: BP is better-controlled. However it is elevated in the evening before she takes her evening dose of amlodipine. We will try splitting it to 2.5 mg twice daily. If this does not work she may need a.m. dose of an additional medication.  # Hyperlipidemia: Ms. Pfahler started taking rosuvastatin one week ago. We will plan to repeat lipids and CMP in 6 weeks. She was encouraged to keep up her exercise.   Current medicines are reviewed at length with the patient today.  The patient does not have concerns regarding medicines.  The following changes  have been made:  Switch amlodipine to 2.5mg  bid.   Labs/ tests ordered today include:   Orders Placed This Encounter  Procedures  . Lipid panel  . Comprehensive metabolic panel     Disposition:   FU with Marquez Ceesay C. Duke Salvia, MD, [facility] in 3 months.   This note was written with the assistance of speech recognition software.  Please excuse any transcriptional errors.  Signed, Antuane Eastridge C. Duke Salvia, MD, [facility]  03/05/2017 8:31 AM    [facility]

## 2017-03-05 ENCOUNTER — Encounter: Payer: Self-pay | Admitting: Cardiovascular Disease

## 2017-03-05 ENCOUNTER — Ambulatory Visit (INDEPENDENT_AMBULATORY_CARE_PROVIDER_SITE_OTHER): Payer: 59 | Admitting: Cardiovascular Disease

## 2017-03-05 VITALS — BP 110/62 | HR 68 | Ht 65.0 in | Wt 172.2 lb

## 2017-03-05 DIAGNOSIS — R42 Dizziness and giddiness: Secondary | ICD-10-CM | POA: Diagnosis not present

## 2017-03-05 DIAGNOSIS — Z79899 Other long term (current) drug therapy: Secondary | ICD-10-CM | POA: Diagnosis not present

## 2017-03-05 DIAGNOSIS — E785 Hyperlipidemia, unspecified: Secondary | ICD-10-CM | POA: Diagnosis not present

## 2017-03-05 DIAGNOSIS — E663 Overweight: Secondary | ICD-10-CM | POA: Diagnosis not present

## 2017-03-05 DIAGNOSIS — I10 Essential (primary) hypertension: Secondary | ICD-10-CM

## 2017-03-05 DIAGNOSIS — E7800 Pure hypercholesterolemia, unspecified: Secondary | ICD-10-CM

## 2017-03-05 HISTORY — DX: Dizziness and giddiness: R42

## 2017-03-05 HISTORY — DX: Essential (primary) hypertension: I10

## 2017-03-05 HISTORY — DX: Hyperlipidemia, unspecified: E78.5

## 2017-03-05 MED ORDER — AMLODIPINE BESYLATE 5 MG PO TABS: 2.5000 mg | ORAL_TABLET | Freq: Two times a day (BID) | ORAL | 1 refills | Status: DC

## 2017-03-05 NOTE — Patient Instructions (Addendum)
Medication change  Take 2.5 mg ( 1/2 tablet of 5 mg ) AMLODIPINE TWICE A DAY   LABS IN 6 WEEKS DO NOT EAT OR DRINK THE MORNING OF THE TEST PLEASE USE [facility] - [address]   LOW CHOLESTEROL DIET GIVEN TO PATIENT  Your physician recommends that you schedule a follow-up appointment in 3 months with DR Aromas.    If you need a refill on your cardiac medications before your next appointment, please call your pharmacy.

## 2017-03-12 ENCOUNTER — Other Ambulatory Visit: Payer: Self-pay | Admitting: Physician Assistant

## 2017-03-12 DIAGNOSIS — G4489 Other headache syndrome: Secondary | ICD-10-CM

## 2017-03-12 DIAGNOSIS — M542 Cervicalgia: Secondary | ICD-10-CM

## 2017-03-12 DIAGNOSIS — N3020 Other chronic cystitis without hematuria: Secondary | ICD-10-CM | POA: Diagnosis not present

## 2017-03-12 DIAGNOSIS — N952 Postmenopausal atrophic vaginitis: Secondary | ICD-10-CM | POA: Diagnosis not present

## 2017-03-12 MED FILL — SERTRALINE HCL 100 MG TAB: 100 | 30 days supply | Qty: 30 | Fill #3

## 2017-03-12 MED FILL — ALPRAZolam 0.25 MG TABS: 0.25 | 30 days supply | Qty: 30 | Fill #2

## 2017-03-12 MED FILL — ESTRADIOL 0.1 MG/GM CRM: 0.1 | 90 days supply | Qty: 43 | Fill #0

## 2017-03-12 MED FILL — ONDANSETRON HCL 8 MG TABLET: 8 | 7 days supply | Qty: 20 | Fill #1

## 2017-03-14 MED FILL — HYDROCODON-APAP 7.5-325: 7.5-325 | 1 days supply | Qty: 4 | Fill #0

## 2017-03-23 MED FILL — CELECOXIB 200 MG CAP: 200 | 30 days supply | Qty: 60 | Fill #0

## 2017-03-26 ENCOUNTER — Other Ambulatory Visit: Payer: Self-pay

## 2017-03-26 NOTE — Telephone Encounter (Signed)
Received a fax for a refill/PA for [name]. Patient was last seen 10/20/2015. Patient received a refill 02/16/2017. Patient needs office visit for further refills. I sent fax back to [facility].

## 2017-04-03 MED FILL — TOPIRAMATE 200 MG TABLET: 200 | 30 days supply | Qty: 30 | Fill #3

## 2017-04-06 ENCOUNTER — Encounter: Payer: Self-pay | Admitting: Physician Assistant

## 2017-04-06 ENCOUNTER — Ambulatory Visit (INDEPENDENT_AMBULATORY_CARE_PROVIDER_SITE_OTHER): Payer: 59 | Admitting: Physician Assistant

## 2017-04-06 VITALS — BP 109/75 | HR 85 | Ht 65.0 in | Wt 166.0 lb

## 2017-04-06 DIAGNOSIS — G4700 Insomnia, unspecified: Secondary | ICD-10-CM

## 2017-04-06 DIAGNOSIS — G43009 Migraine without aura, not intractable, without status migrainosus: Secondary | ICD-10-CM

## 2017-04-06 DIAGNOSIS — M62838 Other muscle spasm: Secondary | ICD-10-CM | POA: Diagnosis not present

## 2017-04-06 DIAGNOSIS — F419 Anxiety disorder, unspecified: Secondary | ICD-10-CM | POA: Diagnosis not present

## 2017-04-06 DIAGNOSIS — M542 Cervicalgia: Secondary | ICD-10-CM | POA: Diagnosis not present

## 2017-04-06 MED ORDER — ONDANSETRON HCL 8 MG PO TABS: 8.0000 mg | ORAL_TABLET | Freq: Three times a day (TID) | ORAL | 3 refills | Status: DC | PRN

## 2017-04-06 MED ORDER — TIZANIDINE HCL 4 MG PO TABS: 4.0000 mg | ORAL_TABLET | Freq: Three times a day (TID) | ORAL | 2 refills | Status: DC

## 2017-04-06 MED ORDER — BACLOFEN 10 MG PO TABS: 10.0000 mg | ORAL_TABLET | Freq: Three times a day (TID) | ORAL | 3 refills | Status: DC | PRN

## 2017-04-06 MED ORDER — ALPRAZOLAM 0.25 MG PO TABS: ORAL_TABLET | ORAL | 2 refills | Status: DC

## 2017-04-06 MED ORDER — HYDROCODONE-ACETAMINOPHEN 7.5-325 MG PO TABS: 1.0000 | ORAL_TABLET | Freq: Four times a day (QID) | ORAL | 0 refills | Status: DC | PRN

## 2017-04-06 MED FILL — tiZANidine HCL 4 MG TABS: 4 | 30 days supply | Qty: 90 | Fill #0

## 2017-04-06 MED FILL — ONDANSETRON HCL 8 MG TABLET: 8 | 7 days supply | Qty: 20 | Fill #0

## 2017-04-06 MED FILL — BACLOFEN 10 MG TABLET: 10 | 20 days supply | Qty: 60 | Fill #0

## 2017-04-06 NOTE — Progress Notes (Signed)
History:  [name] is a 56 y.o. G1P1001 who presents to clinic today for worsening of migraines.  Stress continuing to worsen.  Her sister died just yesterday of an overdose after struggling many years with mental illness.  This is on the heels of her brother in law dying of natural causes just a couple of months ago.  She has taken 2 trips to [place] lately.  On one trip, she had chest pain while on the plane.  She had cardiology workup and no issue found except bad cholesterol.   Neck pain much worse.  This contributes to worsening of HA.   She has learned of a program that may help cover cost of Botox and is interested in pursuing this.    HIT6:54 Number of days in the last 4 weeks with:  Severe headache: 5 Moderate headache: 5 Mild headache: 5  No headache: 13   Past Medical History:  Diagnosis Date  . Dizziness 03/05/2017  . Essential hypertension 03/05/2017  . Hyperlipidemia 03/05/2017  . Infertility, female   . Migraines     Social History   Social History  . Marital status: Married    Spouse name: N/A  . Number of children: N/A  . Years of education: N/A   Occupational History  .  [place] [employer]    [employer]   Social History Main Topics  . Smoking status: Former Smoker    Types: Cigarettes    Quit date: 02/09/1992  . Smokeless tobacco: Never Used  . Alcohol use Yes     Comment: 1-2 weekly  . Drug use: No  . Sexual activity: Yes    Partners: Male   Other Topics Concern  . Not on file   Social History Narrative  . No narrative on file    Family History  Problem Relation Age of Onset  . Hypertension Mother   . Allergic rhinitis Mother   . Cancer Maternal Grandmother   . Stroke    . Eczema Daughter   . Alcohol abuse Father   . Stroke Father   . Heart disease Maternal Grandfather   . Alcohol abuse Paternal Grandfather     No Known Allergies  Current Outpatient Prescriptions on File Prior to Visit  Medication Sig Dispense Refill  .  ALPRAZolam (XANAX) 0.25 MG tablet Take 1 tablet at bedtime as needed for sleep 30 tablet 2  . amLODipine (NORVASC) 5 MG tablet Take 0.5 tablets (2.5 mg total) by mouth 2 (two) times daily. 90 tablet 1  . aspirin EC 81 MG tablet Take 81 mg by mouth daily.    . Azelastine-Fluticasone (DYMISTA) 137-50 MCG/ACT SUSP Place 2 sprays into both nostrils 1 day or 1 dose. 1 Bottle 5  . baclofen (LIORESAL) 10 MG tablet Take 1 tablet (10 mg total) by mouth every 8 (eight) hours as needed. 60 each 3  . celecoxib (CELEBREX) 200 MG capsule Take 1 capsule (200 mg total) by mouth 2 (two) times daily. 60 capsule 3  . celecoxib (CELEBREX) 200 MG capsule TAKE 1 CAPSULE BY MOUTH 2 TIMES DAILY. 60 capsule 11  . cetirizine (ZYRTEC) 10 MG tablet Take 10 mg by mouth daily.    . chlorproMAZINE (THORAZINE) 25 MG tablet Take 1 tablet (25 mg total) by mouth 3 (three) times daily. 30 tablet 0  . HYDROcodone-acetaminophen (NORCO) 7.5-325 MG tablet Take 1 tablet by mouth every 6 (six) hours as needed. 4 tablet 0  . ibuprofen (ADVIL,MOTRIN) 800 MG tablet Take  800 mg by mouth every 8 (eight) hours as needed.    Marland Kitchen KARBINAL ER 4 MG/5ML SUER TAKE ( ) BY MOUTH TWICE DAILY 480 mL 0  . naratriptan (AMERGE) 2.5 MG tablet Take 1 tablet (2.5 mg total) by mouth as needed. Take one (1) tablet at onset of headache; if returns or does not resolve, may repeat after 4 hours; do not exceed five (5) mg in 24 hours. 10 tablet 11  . NONFORMULARY OR COMPOUNDED ITEM Dry needling  Dx Migraine, L hip pain 1 each 0  . ondansetron (ZOFRAN) 8 MG tablet Take 1 tablet (8 mg total) by mouth every 8 (eight) hours as needed. 20 tablet 3  . pantoprazole (PROTONIX) 40 MG tablet Take 1 tablet (40 mg total) by mouth daily. 90 tablet 3  . Pseudoephedrine-DM-GG (SUDAFED COUGH PO) Take by mouth.    . rosuvastatin (CRESTOR) 40 MG tablet Take 1 tablet (40 mg total) by mouth daily. 30 tablet 2  . sertraline (ZOLOFT) 100 MG tablet Take 1 tablet (100 mg total) by  mouth daily. 30 tablet 10  . sulfamethoxazole-trimethoprim (BACTRIM DS,SEPTRA DS) 800-160 MG tablet   3  . SUMAtriptan (IMITREX) 100 MG tablet Take 1 tablet (100 mg total) by mouth once as needed for migraine. 9 tablet 11  . tiZANidine (ZANAFLEX) 4 MG tablet Take 1 tablet (4 mg total) by mouth 3 (three) times daily. 90 tablet 2  . topiramate (TOPAMAX) 200 MG tablet Take 1 tablet (200 mg total) by mouth daily. 30 tablet 11  . traZODone (DESYREL) 50 MG tablet Take 0.5-1 tablets (25-50 mg total) by mouth at bedtime as needed. for sleep 90 tablet 1   No current facility-administered medications on file prior to visit.      Review of Systems:  All pertinent positive/negative included in HPI, all other review of systems are negative   Objective:  Physical Exam BP 109/75   Pulse 85   Ht  (1.651 m)   Wt 166 lb (75.3 kg)   BMI 27.62 kg/m  CONSTITUTIONAL: Well-developed, well-nourished female in no acute distress.  EYES: EOM intact ENT: Normocephalic CARDIOVASCULAR: Regular rate  RESPIRATORY: Normal rate.  MUSCULOSKELETAL: Normal ROM, strength equal bilaterally,  muscle spasm noted SKIN: Warm, dry without erythema  NEUROLOGICAL: Alert, oriented, CN II-XII grossly intact, Appropriate balance PSYCH: Normal behavior, mood   Assessment & Plan:  Assessment: 1. Insomnia, unspecified type   2. Migraine without aura and without status migrainosus, not intractable   3. Muscle spasm   4. Neck pain   5. Anxiety      Plan: Due to patient's truly stressful circumstances, meds refilled as they are without any discussion of changes.   Will seek prior auth for botox.   Follow-up in 3 months or sooner PRN  Bertram Denver, PA-C 04/06/2017 8:15 AM

## 2017-04-09 MED FILL — HYDROCODON-APAP 7.5-325: 7.5-325 | 5 days supply | Qty: 20 | Fill #0

## 2017-04-12 MED FILL — ROSUVASTATIN CALCIUM 40 MG: 40 | 30 days supply | Qty: 30 | Fill #0

## 2017-04-18 ENCOUNTER — Encounter: Payer: Self-pay | Admitting: *Deleted

## 2017-04-19 ENCOUNTER — Encounter: Payer: Self-pay | Admitting: *Deleted

## 2017-04-23 MED FILL — SERTRALINE HCL 100 MG TAB: 100 | 30 days supply | Qty: 30 | Fill #4

## 2017-04-24 MED FILL — ALPRAZolam 0.25 MG TABS: 0.25 | 30 days supply | Qty: 30 | Fill #0

## 2017-05-03 MED FILL — AMLODIPINE BESYLATE 5 MG TA: 5 | 90 days supply | Qty: 90 | Fill #1

## 2017-05-03 MED FILL — TOPIRAMATE 200 MG TABLET: 200 | 30 days supply | Qty: 30 | Fill #4

## 2017-05-03 MED FILL — CELECOXIB 200 MG CAP: 200 | 30 days supply | Qty: 60 | Fill #1

## 2017-05-09 MED FILL — NARATRIPTAN HCL 2.5 MG TAB: 2.5 | 30 days supply | Qty: 9 | Fill #1

## 2017-05-09 MED FILL — PANTOPRAZOLE SOD DR 40 MG T: 40 | 90 days supply | Qty: 90 | Fill #3

## 2017-05-10 MED FILL — ROSUVASTATIN CALCIUM 40 MG: 40 | 30 days supply | Qty: 30 | Fill #1

## 2017-05-23 MED FILL — SERTRALINE HCL 100 MG TAB: 100 | 30 days supply | Qty: 30 | Fill #5

## 2017-05-23 MED FILL — ALPRAZolam 0.25 MG TABS: 0.25 | 30 days supply | Qty: 30 | Fill #1

## 2017-05-29 ENCOUNTER — Ambulatory Visit (INDEPENDENT_AMBULATORY_CARE_PROVIDER_SITE_OTHER): Payer: 59 | Admitting: Cardiovascular Disease

## 2017-05-29 ENCOUNTER — Encounter: Payer: Self-pay | Admitting: Cardiovascular Disease

## 2017-05-29 VITALS — BP 124/66 | HR 68 | Ht 65.0 in | Wt 166.0 lb

## 2017-05-29 DIAGNOSIS — R0602 Shortness of breath: Secondary | ICD-10-CM

## 2017-05-29 DIAGNOSIS — I10 Essential (primary) hypertension: Secondary | ICD-10-CM | POA: Diagnosis not present

## 2017-05-29 DIAGNOSIS — E785 Hyperlipidemia, unspecified: Secondary | ICD-10-CM

## 2017-05-29 NOTE — Patient Instructions (Signed)
Medication Instructions:  Your physician recommends that you continue on your current medications as directed. Please refer to the Current Medication list given to you today.  Labwork: FASTING LIPID/CMET TOMORROW   Testing/Procedures: NONE  Follow-Up: Your physician wants you to follow-up in: 1 YEAR OV  You will receive a reminder letter in the mail two months in advance. If you don't receive a letter, please call our office to schedule the follow-up appointment.  If you need a refill on your cardiac medications before your next appointment, please call your pharmacy.

## 2017-05-29 NOTE — Progress Notes (Signed)
Cardiology Office Note   Date:  05/29/2017   ID:  [name], DOB October 28, 1961, MRN [id number]  PCP:  [name], Alexandria Congress, DO  Cardiologist:   Chilton Si, MD   No chief complaint on file.     History of Present Illness: [name] is a 56 y.o. female with hypertension who presents for follow up.  She was first seen 01/2017 for an evaluation of chest pain and an abnormal EKG.  She was referred for an Lexiscan Myoview that revealed LVEF 53% and no ischemia. She also reported dizziness and her heart rate was noted to be 45 bpm.  Metoprolol was switched to amlodipine.  She reported high blood pressures and it was recommended that she reduce alcohol and Sudafed use.  Based on her lipid profile she was started on rosuvastatin 40 mg daily.  At her last appointment her evening blood pressures were elevated so amlodipine was split into 2.5 mg twice daily.  Since making that change of blood pressure has been very well-controlled and she only had one elevated reading. She initially had flushing and nausea but these improved.  She has noted 2 episodes of chest pain that occurred while at rest and lasted for a few seconds. Denies shortness of breath, lower extremity edema, orthopnea or PND. She wears compression stockings when traveling long distances.  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    [name] has been very stressed.  Her sister was diagnosed with breast cancer in 15-Jan-2023.  Her brother in law died 6 weeks later.  Her other sister died 6 weeks ago from a drug overdose.  She started grief counselling through to [facility] which has been helpful.    Past Medical History:  Diagnosis Date  . Dizziness 03/05/2017  . Essential hypertension 03/05/2017  . Hyperlipidemia 03/05/2017  . Infertility, female   . Migraines     Past Surgical History:  Procedure Laterality Date  . ANKLE SURGERY     Left  . SHOULDER SURGERY     Left     Current Outpatient Prescriptions    Medication Sig Dispense Refill  . ALPRAZolam (XANAX) 0.25 MG tablet Take 1 tablet at bedtime as needed for sleep 30 tablet 2  . amLODipine (NORVASC) 5 MG tablet Take 0.5 tablets (2.5 mg total) by mouth 2 (two) times daily. 90 tablet 1  . aspirin EC 81 MG tablet Take 81 mg by mouth daily.    . Azelastine-Fluticasone (DYMISTA) 137-50 MCG/ACT SUSP Place 2 sprays into both nostrils 1 day or 1 dose. 1 Bottle 5  . baclofen (LIORESAL) 10 MG tablet Take 1 tablet (10 mg total) by mouth every 8 (eight) hours as needed. 60 each 3  . celecoxib (CELEBREX) 200 MG capsule TAKE 1 CAPSULE BY MOUTH 2 TIMES DAILY. 60 capsule 11  . cetirizine (ZYRTEC) 10 MG tablet Take 10 mg by mouth daily.    Marland Kitchen HYDROcodone-acetaminophen (NORCO) 7.5-325 MG tablet Take 1 tablet by mouth every 6 (six) hours as needed. 20 tablet 0  . ibuprofen (ADVIL,MOTRIN) 800 MG tablet Take 800 mg by mouth every 8 (eight) hours as needed.    . naratriptan (AMERGE) 2.5 MG tablet Take 1 tablet (2.5 mg total) by mouth as needed. Take one (1) tablet at onset of headache; if returns or does not resolve, may repeat after 4 hours; do not exceed five (5) mg in 24 hours. 10 tablet 11  . NONFORMULARY OR COMPOUNDED ITEM  Dry needling  Dx Migraine, L hip pain 1 each 0  . ondansetron (ZOFRAN) 8 MG tablet Take 1 tablet (8 mg total) by mouth every 8 (eight) hours as needed. 20 tablet 3  . pantoprazole (PROTONIX) 40 MG tablet Take 1 tablet (40 mg total) by mouth daily. 90 tablet 3  . Pseudoephedrine-DM-GG (SUDAFED COUGH PO) Take by mouth.    . rosuvastatin (CRESTOR) 40 MG tablet Take 1 tablet (40 mg total) by mouth daily. 30 tablet 2  . sertraline (ZOLOFT) 100 MG tablet Take 1 tablet (100 mg total) by mouth daily. 30 tablet 10  . sulfamethoxazole-trimethoprim (BACTRIM DS,SEPTRA DS) 800-160 MG tablet   3  . SUMAtriptan (IMITREX) 100 MG tablet Take 1 tablet (100 mg total) by mouth once as needed for migraine. 9 tablet 11  . tiZANidine (ZANAFLEX) 4 MG tablet Take 1  tablet (4 mg total) by mouth 3 (three) times daily. 90 tablet 2  . topiramate (TOPAMAX) 200 MG tablet Take 1 tablet (200 mg total) by mouth daily. 30 tablet 11  . traZODone (DESYREL) 50 MG tablet Take 0.5-1 tablets (25-50 mg total) by mouth at bedtime as needed. for sleep 90 tablet 1   No current facility-administered medications for this visit.     Allergies:   Patient has no known allergies.    Social History:  The patient  reports that she quit smoking about 25 years ago. Her smoking use included Cigarettes. She has never used smokeless tobacco. She reports that she drinks alcohol. She reports that she does not use drugs.   Family History:  The patient's family history includes Alcohol abuse in her father and paternal grandfather; Allergic rhinitis in her mother; Cancer in her maternal grandmother; Eczema in her daughter; Heart disease in her maternal grandfather; Hypertension in her mother; Stroke in her father.    ROS:  Please see the history of present illness.   Otherwise, review of systems are positive for none.   All other systems are reviewed and negative.    PHYSICAL EXAM: VS:  BP 124/66   Pulse 68   Ht 5\' 5"  (1.651 m)   Wt 75.3 kg (166 lb)   BMI 27.62 kg/m  , BMI Body mass index is 27.62 kg/m. GENERAL:  Well appearing.  No acute distress.  HEENT:  Pupils equal round and reactive, fundi not visualized, oral mucosa unremarkable NECK:  No jugular venous distention, waveform within normal limits, carotid upstroke brisk and symmetric, no bruits LYMPHATICS:  No cervical adenopathy LUNGS:  Clear to auscultation bilaterally.  No crackles, wheezes or rhonchi HEART:  RRR.  PMI not displaced or sustained,S1 and S2 within normal limits, no S3, no S4, no clicks, no rubs, no murmurs ABD:  Flat, positive bowel sounds normal in frequency in pitch, no bruits, no rebound, no guarding, no midline pulsatile mass, no hepatomegaly, no splenomegaly EXT:  2 plus pulses throughout, no edema, no  cyanosis no clubbing SKIN:  No rashes no nodules NEURO:  Cranial nerves II through XII grossly intact, motor grossly intact throughout PSYCH:  Cognitively intact, oriented to person place and time   EKG:  EKG is not ordered today. 01/15/17: sinus bradycardia. Rate 45 bpm. Anterior T-wave inversions.  Exercise Myoview 02/20/17: Nuclear stress EF: 53%.  The left ventricular ejection fraction is mildly decreased (45-54%) / lower limit of normal  There was no ST segment deviation noted during stress.  The study is normal.  This is a low risk study.   Recent Labs:  01/17/2017: ALT 21; BUN 14; Creatinine, Ser 0.85; Hemoglobin 14.2; Platelets 403.0; Potassium 4.1; Sodium 140    Lipid Panel No results found for: CHOL, TRIG, HDL, CHOLHDL, VLDL, LDLCALC, LDLDIRECT    Wt Readings from Last 3 Encounters:  05/29/17 75.3 kg (166 lb)  04/06/17 75.3 kg (166 lb)  03/05/17 78.1 kg (172 lb 3.2 oz)      ASSESSMENT AND PLAN:  # Chest pain: # Shortness of breath: Chest pain is better and exercise Myoview was negative for ischemia. Her shortness of breath has also improved.   # Hypertension: BP is well-controlled.  Continue amlodipine.  # Hyperlipidemia: Check lipids and CMP.  Continue rosuvastatin.    Current medicines are reviewed at length with the patient today.  The patient does not have concerns regarding medicines.  The following changes have been made:  None   Labs/ tests ordered today include:   Orders Placed This Encounter  Procedures  . Lipid panel  . Comprehensive metabolic panel     Disposition:   FU with Raylin Winer C. Duke Salviaandolph, MD, [facility] in 1 year.    This note was written with the assistance of speech recognition software.  Please excuse any transcriptional errors.  Signed, Pakou Rainbow C. Duke Salviaandolph, MD, [facility]  05/29/2017 10:28 AM    [facility]

## 2017-05-30 DIAGNOSIS — I10 Essential (primary) hypertension: Secondary | ICD-10-CM | POA: Diagnosis not present

## 2017-05-30 LAB — LIPID PANEL
CHOLESTEROL TOTAL: 162 mg/dL (ref 100–199)
Chol/HDL Ratio: 2.1 ratio (ref 0.0–4.4)
HDL: 76 mg/dL (ref >39)
LDL Calculated: 66 mg/dL (ref 0–99)
TRIGLYCERIDES: 102 mg/dL (ref 0–149)
VLDL Cholesterol Cal: 20 mg/dL (ref 5–40)

## 2017-05-30 LAB — COMPREHENSIVE METABOLIC PANEL
ALBUMIN: 4.3 g/dL (ref 3.5–5.5)
ALT: 37 IU/L — ABNORMAL HIGH (ref 0–32)
AST: 28 IU/L (ref 0–40)
Albumin/Globulin Ratio: 1.7 (ref 1.2–2.2)
Alkaline Phosphatase: 62 IU/L (ref 39–117)
BUN / CREAT RATIO: 27 — AB (ref 9–23)
BUN: 22 mg/dL (ref 6–24)
Bilirubin Total: 0.3 mg/dL (ref 0.0–1.2)
CALCIUM: 9 mg/dL (ref 8.7–10.2)
CO2: 20 mmol/L (ref 20–29)
CREATININE: 0.82 mg/dL (ref 0.57–1.00)
Chloride: 108 mmol/L — ABNORMAL HIGH (ref 96–106)
GFR, EST AFRICAN AMERICAN: 91 mL/min/{1.73_m2} (ref >59)
GFR, EST NON AFRICAN AMERICAN: 79 mL/min/{1.73_m2} (ref >59)
GLOBULIN, TOTAL: 2.6 g/dL (ref 1.5–4.5)
Glucose: 96 mg/dL (ref 65–99)
Potassium: 4.5 mmol/L (ref 3.5–5.2)
SODIUM: 144 mmol/L (ref 134–144)
TOTAL PROTEIN: 6.9 g/dL (ref 6.0–8.5)

## 2017-05-31 ENCOUNTER — Encounter: Payer: Self-pay | Admitting: Physician Assistant

## 2017-05-31 ENCOUNTER — Other Ambulatory Visit: Payer: Self-pay | Admitting: Physician Assistant

## 2017-05-31 DIAGNOSIS — M542 Cervicalgia: Secondary | ICD-10-CM

## 2017-06-01 ENCOUNTER — Encounter: Payer: Self-pay | Admitting: Family Medicine

## 2017-06-01 ENCOUNTER — Encounter: Payer: Self-pay | Admitting: Cardiovascular Disease

## 2017-06-01 DIAGNOSIS — G43909 Migraine, unspecified, not intractable, without status migrainosus: Secondary | ICD-10-CM

## 2017-06-01 MED ORDER — HYDROCODONE-ACETAMINOPHEN 7.5-325 MG PO TABS: 1.0000 | ORAL_TABLET | Freq: Four times a day (QID) | ORAL | 0 refills | Status: DC | PRN

## 2017-06-05 MED FILL — SULFAMETHOXAZOLE/TMP DS TAB: 800-160 | 30 days supply | Qty: 30 | Fill #0

## 2017-06-11 MED FILL — TOPIRAMATE 200 MG TABLET: 200 | 30 days supply | Qty: 30 | Fill #5

## 2017-06-11 MED FILL — BACLOFEN 10 MG TABLET: 10 | 20 days supply | Qty: 60 | Fill #1

## 2017-06-11 MED FILL — CELECOXIB 200 MG CAP: 200 | 30 days supply | Qty: 60 | Fill #2

## 2017-06-11 MED FILL — ROSUVASTATIN CALCIUM 40 MG: 40 | 30 days supply | Qty: 30 | Fill #2

## 2017-06-14 ENCOUNTER — Encounter: Payer: Self-pay | Admitting: Physical Therapy

## 2017-06-14 ENCOUNTER — Ambulatory Visit (INDEPENDENT_AMBULATORY_CARE_PROVIDER_SITE_OTHER): Payer: 59 | Admitting: Physical Therapy

## 2017-06-14 DIAGNOSIS — M6281 Muscle weakness (generalized): Secondary | ICD-10-CM

## 2017-06-14 DIAGNOSIS — G44221 Chronic tension-type headache, intractable: Secondary | ICD-10-CM

## 2017-06-14 DIAGNOSIS — M7918 Myalgia, other site: Secondary | ICD-10-CM

## 2017-06-14 DIAGNOSIS — R29898 Other symptoms and signs involving the musculoskeletal system: Secondary | ICD-10-CM | POA: Diagnosis not present

## 2017-06-14 DIAGNOSIS — M791 Myalgia: Secondary | ICD-10-CM | POA: Diagnosis not present

## 2017-06-14 NOTE — Therapy (Signed)
[facility] [address] [place], [place], [address] Phone: [phone]   Fax:  [phone]  Physical Therapy Evaluation  Patient Details  Name: [name] MRN: [id number] Date of Birth: 08/07/1961 Referring Provider: Dr Seabron SpatesYvonne Lowne Chase  Encounter Date: 06/14/2017      PT End of Session - 06/14/17 1641    Visit Number 1   Number of Visits 6   Date for PT Re-Evaluation 07/26/17   PT Start Time 1641   PT Stop Time 1742   PT Time Calculation (min) 61 min   Activity Tolerance Patient tolerated treatment well      Past Medical History:  Diagnosis Date  . Dizziness 03/05/2017  . Essential hypertension 03/05/2017  . Hyperlipidemia 03/05/2017  . Infertility, female   . Migraines     Past Surgical History:  Procedure Laterality Date  . ANKLE SURGERY     Left  . SHOULDER SURGERY     Left    There were no vitals filed for this visit.       Subjective Assessment - 06/14/17 1641    Subjective Mandy reports long h/o migraines, she is having botox injections in the AM, was getting this every 3 months however insurance denied it and she hasn't been able to get it lately.  Has been having a migraine for 3 months and ealier today she had the migraine today.  She is also having Rt hip pain for the last 8 months and it comes and goes.  Most recent hip flare up was 2 weeks ago.    How long can you sit comfortably? tolerates ~ 30 min   How long can you walk comfortably? difficulty with climbing up stairs/hills with hiking   Patient Stated Goals less headaches, less hip to allow for hiking and riding her bike.    Currently in Pain? Yes  had a migraine until 2 pm today medication helped that today.    Pain Score 6    Pain Location Hip   Pain Orientation Right;Lateral   Pain Descriptors / Indicators Sharp   Pain Type Acute pain   Pain Onset More than a month ago   Pain Frequency Constant   Aggravating Factors  sitting    Pain Relieving Factors rest, ice when its on.             [facility] PT Assessment - 06/14/17 0001      Assessment   Medical Diagnosis migraines & Rt hip pain   Referring Provider Dr Seabron SpatesYvonne Lowne Chase   Onset Date/Surgical Date 10/15/16   Next MD Visit every 3 months   Prior Therapy yes     Precautions   Precautions None     Balance Screen   Has the patient fallen in the past 6 months No     Prior Function   Level of Independence Independent   Vocation Full time employment   Vocation Requirements nurse   Leisure hike, ride her bike     Observation/Other Assessments   Focus on Therapeutic Outcomes (FOTO)  45% limited     Posture/Postural Control   Posture/Postural Control Postural limitations   Postural Limitations Rounded Shoulders;Forward head;Increased thoracic kyphosis     ROM / Strength   AROM / PROM / Strength AROM;Strength     AROM   AROM Assessment Site Cervical;Shoulder;Elbow;Hip;Knee   Right/Left Shoulder --  WNL   Right/Left Elbow --  WNL   Right/Left Hip --  WNL  Right/Left Knee --  WNL   Cervical Flexion WNL   Cervical Extension WNL   Cervical - Right Rotation 65   Cervical - Left Rotation WNL     Strength   Strength Assessment Site Shoulder;Elbow;Hip;Knee   Right/Left Shoulder --  bilat WNL   Right/Left Elbow --  bilat WNL   Right/Left Hip Left;Right   Right Hip Flexion --  5-/5   Right Hip Extension --  5-/5   Right Hip ABduction 4-/5   Left Hip Flexion 5/5   Left Hip Extension 5/5   Left Hip ABduction --  5-/5   Right/Left Knee --  bilat WNL     Flexibility   Soft Tissue Assessment /Muscle Length --  TFL tight bilat, hip flexors tight bilat      Palpation   Palpation comment tightness and tenderness in Rt TFL, hamstring,lateral quad, also tight periocciput.             Objective measurements completed on examination: See above findings.          [facility] Adult PT Treatment/Exercise - 06/14/17 0001       Exercises   Exercises Other Exercises   Other Exercises  low kneel hip flexor stretch, cross body stretch with strap     Modalities   Modalities Electrical Stimulation;Moist Heat     Moist Heat Therapy   Number Minutes Moist Heat 20 Minutes   Moist Heat Location Hip  Rt and hamstring     Electrical Stimulation   Electrical Stimulation Location R lateral hip and hamstring   Electrical Stimulation Action IFC   Electrical Stimulation Parameters to tolerance   Electrical Stimulation Goals Pain;Tone     Manual Therapy   Manual Therapy Soft tissue mobilization   Soft tissue mobilization Rt hamstring, lateral hip          Trigger Point Dry Needling - 06/14/17 1732    Consent Given? Yes   Education Handout Provided Yes   Muscles Treated Lower Body Hamstring;Tensor fascia lata  Rt LE   Tensor Fascia Lata Response Palpable increased muscle length;Twitch response elicited   Hamstring Response Palpable increased muscle length;Twitch response elicited              PT Education - 06/14/17 1729    Education provided Yes   Education Details DN, HEP    Person(s) Educated Patient   Methods Explanation;Demonstration;Handout   Comprehension Returned demonstration;Verbalized understanding             PT Long Term Goals - 06/14/17 1743      PT LONG TERM GOAL #1   Title return to hiking and ascending stairs without Rt hip pain (07/26/17)    Time 6   Period Weeks   Status New     PT LONG TERM GOAL #2   Title increase Rt cervical rotation =/> 75 degrees ( 07/26/17)    Time 6   Period Weeks   Status New     PT LONG TERM GOAL #3   Title improve Rt hip strength =/> 5-/5 without pain ( 07/26/17)    Time 6   Period Weeks   Status New     PT LONG TERM GOAL #4   Title report decreased migraines =/> 75% with improved soft tissue flexibility ( 07/26/17)    Time 6   Period Weeks   Status New     PT LONG TERM GOAL #5   Title Improve FOTO to </= 35% limitation (07/26/17)  Time 6   Period Weeks   Status New                Plan - 06/14/17 1735    Clinical Impression Statement 56 yo female presents with ~ 8 month h/o of Rt lateral hip pain and long h/o migraines.  She was getting relief of her migraines with botox injections however insurance has denied it this time.  She is on a program sponsored through the company to help pay for it and will have the injections tomorrow so we will hold off treatment of the neck/head until after that.  Her hip strength is good however she has tightness around the joint and multiple muscular trigger points wiith tenderness.  Responded well to manual work with DN today.  Would benefit from continued tx . The pain interferes with her ability to [facility] and hike, use stairs and perform IADLs.    Clinical Presentation Evolving   Clinical Decision Making Moderate   Rehab Potential Good   PT Frequency 1x / week   PT Duration 6 weeks   PT Treatment/Interventions Moist Heat;Ultrasound;Traction;Therapeutic exercise;Dry needling;Taping;Manual techniques;Neuromuscular re-education;Cryotherapy;Electrical Stimulation;Iontophoresis 4mg /ml Dexamethasone;Patient/family education   PT Next Visit Plan assess response to DN, continue PRN, add in cervical when she is ready,    Consulted and Agree with Plan of Care Patient      Patient will benefit from skilled therapeutic intervention in order to improve the following deficits and impairments:  Decreased range of motion, Pain, Increased muscle spasms, Postural dysfunction, Decreased strength  Visit Diagnosis: Pain in right buttock - Plan: PT plan of care cert/re-cert  Chronic tension-type headache, intractable - Plan: PT plan of care cert/re-cert  Other symptoms and signs involving the musculoskeletal system - Plan: PT plan of care cert/re-cert  Muscle weakness (generalized) - Plan: PT plan of care cert/re-cert     Problem List Patient Active Problem List   Diagnosis  Date Noted  . Essential hypertension 03/05/2017  . Dizziness 03/05/2017  . Hyperlipidemia 03/05/2017  . Plantar fasciitis of right foot 02/02/2016  . Chronic rhinitis 10/20/2015  . Neck pain 03/16/2015  . Overweight 01/22/2015  . Anxiety 07/14/2014  . Insomnia 05/28/2012  . Migraine 05/28/2012  . Muscle spasm 05/28/2012  . HEADACHE 04/07/2009    Roderic Scarce PT  06/14/2017, 5:50 PM  [facility] [address] [place], [place], [address] Phone: [phone]   Fax:  [phone]  Name: JNIYAH DANTUONO MRN: [id number] Date of Birth: 09-06-1961

## 2017-06-14 NOTE — Patient Instructions (Addendum)
Trigger Point Dry Needling  . What is Trigger Point Dry Needling (DN)? o DN is a physical therapy technique used to treat muscle pain and dysfunction. Specifically, DN helps deactivate muscle trigger points (muscle knots).  o A thin filiform needle is used to penetrate the skin and stimulate the underlying trigger point. The goal is for a local twitch response (LTR) to occur and for the trigger point to relax. No medication of any kind is injected during the procedure.   . What Does Trigger Point Dry Needling Feel Like?  o The procedure feels different for each individual patient. Some patients report that they do not actually feel the needle enter the skin and overall the process is not painful. Very mild bleeding may occur. However, many patients feel a deep cramping in the muscle in which the needle was inserted. This is the local twitch response.   Marland Kitchen. How Will I feel after the treatment? o Soreness is normal, and the onset of soreness may not occur for a few hours. Typically this soreness does not last longer than two days.  o Bruising is uncommon, however; ice can be used to decrease any possible bruising.  o In rare cases feeling tired or nauseous after the treatment is normal. In addition, your symptoms may get worse before they get better, this period will typically not last longer than 24 hours.   . What Can I do After My Treatment? o Increase your hydration by drinking more water for the next 24 hours. o You may place ice or heat on the areas treated that have become sore, however, do not use heat on inflamed or bruised areas. Heat often brings more relief post needling. o You can continue your regular activities, but vigorous activity is not recommended initially after the treatment for 24 hours. o DN is best combined with other physical therapy such as strengthening, stretching, and other therapies.   Quads / HF, Lunge    Kneel in deep lunge, behind leg on floor. Push pelvis down  slowly while slightly arching back until stretch is felt on front of hip. Hold 30-45___ seconds. Repeat _1-2__ times per session. Do _1__ sessions per day. Repeat on the other leg.   Outer Hip Stretch: Reclined IT Band Stretch (Strap)    Strap around opposite foot, pull across only as far as possible with shoulders on mat. Hold for __30-45__ secs. Repeat _1-2___ times each leg.  Copyright  VHI. All rights reserved.

## 2017-06-15 ENCOUNTER — Ambulatory Visit (INDEPENDENT_AMBULATORY_CARE_PROVIDER_SITE_OTHER): Payer: 59 | Admitting: Physician Assistant

## 2017-06-15 ENCOUNTER — Encounter: Payer: Self-pay | Admitting: Physician Assistant

## 2017-06-15 ENCOUNTER — Encounter: Payer: 59 | Admitting: Physician Assistant

## 2017-06-15 VITALS — BP 104/76 | HR 74 | Wt 167.0 lb

## 2017-06-15 DIAGNOSIS — G43009 Migraine without aura, not intractable, without status migrainosus: Secondary | ICD-10-CM | POA: Diagnosis not present

## 2017-06-15 DIAGNOSIS — M62838 Other muscle spasm: Secondary | ICD-10-CM | POA: Diagnosis not present

## 2017-06-15 DIAGNOSIS — M542 Cervicalgia: Secondary | ICD-10-CM

## 2017-06-15 DIAGNOSIS — F419 Anxiety disorder, unspecified: Secondary | ICD-10-CM

## 2017-06-15 MED ORDER — ALPRAZOLAM 0.25 MG PO TABS: ORAL_TABLET | ORAL | 1 refills | Status: DC

## 2017-06-15 MED ORDER — ONDANSETRON HCL 8 MG PO TABS: 8.0000 mg | ORAL_TABLET | Freq: Three times a day (TID) | ORAL | 3 refills | Status: DC | PRN

## 2017-06-15 MED ORDER — HYDROCODONE-ACETAMINOPHEN 7.5-325 MG PO TABS: 1.0000 | ORAL_TABLET | Freq: Four times a day (QID) | ORAL | 0 refills | Status: DC | PRN

## 2017-06-15 MED FILL — HYDROCODON-APAP 7.5-325: 7.5-325 | 5 days supply | Qty: 20 | Fill #0

## 2017-06-15 MED FILL — NARATRIPTAN HCL 2.5 MG TAB: 2.5 | 30 days supply | Qty: 9 | Fill #2

## 2017-06-15 MED FILL — ONDANSETRON HCL 8 MG TAB: 8 | 7 days supply | Qty: 20 | Fill #0

## 2017-06-15 NOTE — Patient Instructions (Signed)
Botulinum Toxin Cosmetic Injection, Care After These instructions give you information about caring for yourself after your procedure. Your doctor may also give you more specific instructions. Call your doctor if you have any problems or questions after your procedure. Follow these instructions at home:  Do not lie down for 4 hours after treatment or as told by your doctor.  Do not rub the area where you got a shot. This can spread the toxin.  Depending on where the shot was: ? Your doctor may ask you not to move your muscles in that area. Follow instructions from your doctor. ? Your doctor may tell you to frown or squint regularly. You may need to do these exercises every 15 minutes for 1 hour after treatment, or as told by your doctor. Follow instructions from your doctor.  Do not do any activities that take a lot of effort (are strenuous) for 2 hours after the procedure or for as long as told by your doctor. This includes: ? Lifting heavy items. ? Working out. ? Doing activities that make your heart beat more quickly.  Do not get laser treatments, facials, or facial massages for 1-2 weeks after the procedure or for as long as told by your doctor.  If directed, apply ice to the area where you got a shot: ? Put ice in a plastic bag. ? Place a towel between your skin and the bag. ? Leave the ice on for 20 minutes, 2-3 times per day.  Take over-the-counter and prescription medicines only as told by your doctor.  Keep all follow-up visits with your doctor. This is important. Contact a doctor if:  You have neck pain.  You have a headache that gets worse.  You feel sick to your stomach (nauseous) and it gets worse.  You feel unusually sleepy.  You have pain, tightness, weakness, or spasms in your face or neck.  Your mouth is dry.  You have trouble pooping (constipation).  You feel worried or nervous (anxious). Get help right away if:  You have chest pain.  You  faint.  You have a fever.  You have signs of an allergic reaction. These include: ? An itchy rash or welts. ? Wheezing or shortness of breath. ? Dizziness.  You have double or blurred vision.  The black centers of your eyes (pupils) start to get bigger (dilate) and are sensitive to light.  Your eyelids start to get droopy or swollen.  You have trouble speaking.  You feel short of breath or have trouble breathing.  Your voice changes or starts to sound hoarse.  You start to have trouble swallowing.  You have muscle pain, weakness, or spasms in other parts of your body. This information is not intended to replace advice given to you by your health care provider. Make sure you discuss any questions you have with your health care provider. Document Released: 06/29/2011 Document Revised: 05/04/2016 Document Reviewed: 06/02/2015 Elsevier Interactive Patient Education  2018 Elsevier Inc.  

## 2017-06-15 NOTE — Progress Notes (Signed)
S: Pt in office today for Botox injections. Botox was once an effective portion of her regimen for migraine prevention.  She has not used in many months due to insurance coverage.  Her headaches have just gotten worse and worse.  Typical dose: 100 units.     O: BP 104/76   Pulse 74   Wt 167 lb (75.8 kg)   BMI 27.79 kg/m    Botox Procedure Note Vial of Botox was : Supplied by [employer] Lot # [id number] Expiration Date 12/2019  Botox Dosing by [employer] for Chronic Migraine   Injection Sites for Migraines  Botox 100 units was injected using the dosage in the table above in the pattern shown above.  A: Migraine  Muscle spasm   P: Botox 100 units injected today.  All other medications provided by this office were reviewed and refilled as appropriate.  She should not need any further refills for the next 3 months.   RTC 3 Months.

## 2017-06-21 ENCOUNTER — Other Ambulatory Visit: Payer: Self-pay | Admitting: Physician Assistant

## 2017-06-21 MED FILL — SERTRALINE HCL 100 MG TAB: 100 | 30 days supply | Qty: 30 | Fill #6

## 2017-07-02 ENCOUNTER — Telehealth: Payer: Self-pay | Admitting: Emergency Medicine

## 2017-07-02 ENCOUNTER — Other Ambulatory Visit: Payer: Self-pay | Admitting: Family Medicine

## 2017-07-02 DIAGNOSIS — N390 Urinary tract infection, site not specified: Secondary | ICD-10-CM | POA: Diagnosis not present

## 2017-07-02 NOTE — Telephone Encounter (Signed)
Pt request urine culture

## 2017-07-03 ENCOUNTER — Telehealth: Payer: Self-pay | Admitting: *Deleted

## 2017-07-03 DIAGNOSIS — Z411 Encounter for cosmetic surgery: Secondary | ICD-10-CM | POA: Diagnosis not present

## 2017-07-03 DIAGNOSIS — K130 Diseases of lips: Secondary | ICD-10-CM | POA: Diagnosis not present

## 2017-07-03 DIAGNOSIS — L570 Actinic keratosis: Secondary | ICD-10-CM | POA: Diagnosis not present

## 2017-07-03 DIAGNOSIS — L219 Seborrheic dermatitis, unspecified: Secondary | ICD-10-CM | POA: Diagnosis not present

## 2017-07-03 MED FILL — FLUOCINONIDE 0.05% SOLUTION: 0.05 | 30 days supply | Qty: 60 | Fill #0

## 2017-07-03 NOTE — Telephone Encounter (Signed)
Pt called to check status of her Ucx results. Advised her the results would not be ready until tomorrow.

## 2017-07-04 LAB — URINE CULTURE: ORGANISM ID, BACTERIA: NO GROWTH

## 2017-07-05 ENCOUNTER — Encounter: Payer: Self-pay | Admitting: Physical Therapy

## 2017-07-05 ENCOUNTER — Ambulatory Visit (INDEPENDENT_AMBULATORY_CARE_PROVIDER_SITE_OTHER): Payer: 59 | Admitting: Physical Therapy

## 2017-07-05 DIAGNOSIS — M791 Myalgia: Secondary | ICD-10-CM

## 2017-07-05 DIAGNOSIS — R29898 Other symptoms and signs involving the musculoskeletal system: Secondary | ICD-10-CM

## 2017-07-05 DIAGNOSIS — R293 Abnormal posture: Secondary | ICD-10-CM | POA: Diagnosis not present

## 2017-07-05 DIAGNOSIS — M6281 Muscle weakness (generalized): Secondary | ICD-10-CM

## 2017-07-05 DIAGNOSIS — M7918 Myalgia, other site: Secondary | ICD-10-CM

## 2017-07-05 DIAGNOSIS — G44221 Chronic tension-type headache, intractable: Secondary | ICD-10-CM | POA: Diagnosis not present

## 2017-07-05 NOTE — Therapy (Addendum)
[place]  [address] [place], [place], [address] Phone: [phone]   Fax:  [phone]  Physical Therapy Treatment  Patient Details  Name: [name] MRN: [id number] Date of Birth: 02/01/1961 Referring Provider: Dr Roma Schanz  Encounter Date: 07/05/2017      PT End of Session - 07/05/17 1702    Visit Number 2   Number of Visits 6   Date for PT Re-Evaluation 07/26/17   PT Start Time 1702   PT Stop Time 3832   PT Time Calculation (min) 51 min   Activity Tolerance Patient tolerated treatment well      Past Medical History:  Diagnosis Date  . Dizziness 03/05/2017  . Essential hypertension 03/05/2017  . Hyperlipidemia 03/05/2017  . Infertility, female   . Migraines     Past Surgical History:  Procedure Laterality Date  . ANKLE SURGERY     Left  . SHOULDER SURGERY     Left    There were no vitals filed for this visit.      Subjective Assessment - 07/05/17 1702    Subjective [name] said her hip feels fantastic, she wants to have her neck treated today. Going hiking on sunday. Had the botox injections after last visit, also had the ear piercing and the migraine has been gone since then.  Still having tightness in bilat upper traps   Patient Stated Goals less headaches, less hip to allow for hiking and riding her bike.    Currently in Pain? No/denies                         [facility] Adult PT Treatment/Exercise - 07/05/17 0001      Modalities   Modalities Electrical Stimulation;Moist Heat     Moist Heat Therapy   Number Minutes Moist Heat 15 Minutes   Moist Heat Location Cervical  thoracic     Electrical Stimulation   Electrical Stimulation Location bilat upper shoulders and neck   Electrical Stimulation Action IFC    Electrical Stimulation Parameters to tolerance   Electrical Stimulation Goals Pain;Tone     Manual Therapy   Manual Therapy Soft tissue mobilization   Soft  tissue mobilization STM & trigger point release to bilat upper traps, pericervical and occipital           Trigger Point Dry Needling - 07/05/17 1706    Consent Given? Yes   Education Handout Provided No   Muscles Treated Upper Body Upper trapezius;Longissimus;Oblique capitus;Suboccipitals muscle group   Upper Trapezius Response Palpable increased muscle length;Twitch reponse elicited  bilat with stim   Oblique Capitus Response Palpable increased muscle length;Twitch response elicited  bilat   SubOccipitals Response Palpable increased muscle length;Twitch response elicited  right   Longissimus Response Palpable increased muscle length;Twitch response elicited  bilat thoracic                   PT Long Term Goals - 07/05/17 1744      PT LONG TERM GOAL #1   Title return to hiking and ascending stairs without Rt hip pain (07/26/17)    Status On-going     PT LONG TERM GOAL #2   Title increase Rt cervical rotation =/> 75 degrees ( 07/26/17)    Status On-going     PT LONG TERM GOAL #3   Title improve Rt hip strength =/> 5-/5 without pain ( 07/26/17)    Status On-going  PT LONG TERM GOAL #4   Title report decreased migraines =/> 75% with improved soft tissue flexibility ( 07/26/17)    Status On-going     PT LONG TERM GOAL #5   Title Improve FOTO to </= 35% limitation (07/26/17)    Status On-going               Plan - 07/05/17 1741    Clinical Impression Statement [name] had a great response to DN of her hip with a resolution of all symptoms there.  She hasn't tried hiking yet however is supposed to go this weekend.  She has had some resolution of her migraines between the botox injections and the ear piercing however still has a lot of tightness in the cervical musculature. Responded well to manual work there today.    Rehab Potential Good   PT Frequency 1x / week   PT Duration 6 weeks   PT Treatment/Interventions Moist Heat;Ultrasound;Traction;Therapeutic  exercise;Dry needling;Taping;Manual techniques;Neuromuscular re-education;Cryotherapy;Electrical Stimulation;Iontophoresis '4mg'$ /ml Dexamethasone;Patient/family education   PT Next Visit Plan assess DN response to neck, and how hiking has gone   Consulted and Agree with Plan of Care Patient      Patient will benefit from skilled therapeutic intervention in order to improve the following deficits and impairments:  Decreased range of motion, Pain, Increased muscle spasms, Postural dysfunction, Decreased strength  Visit Diagnosis: Pain in right buttock  Chronic tension-type headache, intractable  Other symptoms and signs involving the musculoskeletal system  Muscle weakness (generalized)  Abnormal posture     Problem List Patient Active Problem List   Diagnosis Date Noted  . Essential hypertension 03/05/2017  . Dizziness 03/05/2017  . Hyperlipidemia 03/05/2017  . Plantar fasciitis of right foot 02/02/2016  . Chronic rhinitis 10/20/2015  . Neck pain 03/16/2015  . Overweight 01/22/2015  . Anxiety 07/14/2014  . Insomnia 05/28/2012  . Migraine 05/28/2012  . Muscle spasm 05/28/2012  . HEADACHE 04/07/2009    Jeral Pinch PT  07/05/2017, 5:45 PM  [facility] [address] [place], [place], [address] Phone: [phone]   Fax:  [phone]  Name: [name] MRN: [id number] Date of Birth: [birth year]   PHYSICAL THERAPY DISCHARGE SUMMARY  Visits from Start of Care: 2  Current functional level related to goals / functional outcomes: Unknown.  Patient responded great to her first treatment, had a second and has not returned for more treatment.    Remaining deficits: unknown   Education / Equipment: HEP and trigger point dry needling  Plan:                                                    Patient goals were not met. Patient is being discharged due to not returning since the last visit.  ?????    Jeral Pinch,  PT 08/01/17 3:30 PM

## 2017-07-12 MED FILL — ALPRAZolam 0.25 MG TABS: 0.25 | 30 days supply | Qty: 30 | Fill #2

## 2017-07-16 ENCOUNTER — Other Ambulatory Visit: Payer: Self-pay | Admitting: Cardiovascular Disease

## 2017-07-16 MED FILL — ROSUVASTATIN CALCIUM 40 MG: 40 | 90 days supply | Qty: 90 | Fill #0

## 2017-07-16 MED FILL — TOPIRAMATE 200 MG TABLET: 200 | 30 days supply | Qty: 30 | Fill #6

## 2017-07-16 NOTE — Telephone Encounter (Signed)
Please review for refill. Thanks!  

## 2017-07-16 NOTE — Telephone Encounter (Signed)
REFILL 

## 2017-07-18 MED FILL — SERTRALINE HCL 100 MG TAB: 100 | 30 days supply | Qty: 30 | Fill #7

## 2017-07-18 MED FILL — CELECOXIB 200 MG CAPSULE: 200 | 30 days supply | Qty: 60 | Fill #3

## 2017-08-03 ENCOUNTER — Telehealth: Payer: Self-pay

## 2017-08-03 DIAGNOSIS — G43909 Migraine, unspecified, not intractable, without status migrainosus: Secondary | ICD-10-CM

## 2017-08-03 NOTE — Telephone Encounter (Signed)
PT referral placed.

## 2017-08-06 ENCOUNTER — Ambulatory Visit (INDEPENDENT_AMBULATORY_CARE_PROVIDER_SITE_OTHER): Payer: 59 | Admitting: Physical Therapy

## 2017-08-06 DIAGNOSIS — G44221 Chronic tension-type headache, intractable: Secondary | ICD-10-CM | POA: Diagnosis not present

## 2017-08-06 DIAGNOSIS — R29898 Other symptoms and signs involving the musculoskeletal system: Secondary | ICD-10-CM | POA: Diagnosis not present

## 2017-08-06 NOTE — Therapy (Addendum)
[place], [place], [address] Phone: [phone]   Fax:  [phone]  Physical Therapy Evaluation  Patient Details  Name: [name] MRN: [id number] Date of Birth: 10-31-1961 Referring Provider: Dr Roma Schanz   Encounter Date: 08/06/2017      PT End of Session - 08/06/17 0940    Visit Number 1   Number of Visits 6   Date for PT Re-Evaluation 10/29/17   PT Start Time 0940   PT Stop Time 3845   PT Time Calculation (min) 62 min   Activity Tolerance Patient tolerated treatment well      Past Medical History:  Diagnosis Date  . Dizziness 03/05/2017  . Essential hypertension 03/05/2017  . Hyperlipidemia 03/05/2017  . Infertility, female   . Migraines     Past Surgical History:  Procedure Laterality Date  . ANKLE SURGERY     Left  . SHOULDER SURGERY     Left    There were no vitals filed for this visit.       Subjective Assessment - 08/06/17 0940    Subjective Mandy  reports that she is doing pretty good managing her headaches, hasn't had one in a month. She does feel tight through her neck . Still getting botox every 3 months last one 2 months ago.    Patient Stated Goals keep the HA at [place].    Currently in Pain? No/denies            [facility] PT Assessment - 08/06/17 0001      Assessment   Medical Diagnosis Migraines   Referring Provider Dr Roma Schanz    Onset Date/Surgical Date 10/15/16   Next MD Visit every 3 months     Observation/Other Assessments   Focus on Therapeutic Outcomes (FOTO)  41% limited     Posture/Postural Control   Posture/Postural Control Postural limitations   Postural Limitations Rounded Shoulders;Forward head;Increased thoracic kyphosis     AROM   Cervical Flexion WNL   Cervical Extension 32   Cervical - Right Rotation 50   Cervical - Left Rotation 45     Strength   Right/Left Shoulder --  WNL   Right/Left Elbow --  WNL      Palpation   Palpation comment very tight in bilat upper shoulders and neck with some tenderness, Tight through her pecs as well.             Objective measurements completed on examination: See above findings.          [facility] PT Treatment/Exercise - 08/06/17 0001      Exercises   Other Exercises  supine over noodle for thoracic stretch, then angel stretch, shoulder rolls, prone on elbows thoracic retraction.      Modalities   Modalities Electrical Stimulation;Moist Heat     Moist Heat Therapy   Number Minutes Moist Heat 20 Minutes   Moist Heat Location Cervical     Electrical Stimulation   Electrical Stimulation Location bilat upper shoulders and neck   Electrical Stimulation Action IFC   Electrical Stimulation Parameters  to tolerance    Electrical Stimulation Goals Pain;Tone     Manual Therapy   Manual Therapy Soft tissue mobilization   Soft tissue mobilization soft tissue work bilat upper traps, levator, periocciput and cervical paraspinals.  Rt Upper trap tighter than Lt, Lt pericervical tighter than Rt.           Trigger Point  Dry Needling - 08/06/17 1008    Consent Given? Yes   Education Handout Provided No   Muscles Treated Upper Body Upper trapezius;Oblique capitus;Levator scapulae;Longissimus   Upper Trapezius Response Twitch reponse elicited;Palpable increased muscle length  bilat   Oblique Capitus Response Palpable increased muscle length;Twitch response elicited  bilat   SubOccipitals Response Palpable increased muscle length;Twitch response elicited  bilat   Levator Scapulae Response Palpable increased muscle length;Twitch response elicited  bilat with stim   Longissimus Response Palpable increased muscle length;Twitch response elicited  bilat cervical C2-5                   PT Long Term Goals - 08/06/17 4481      PT LONG TERM GOAL #1   Title I with self care to assist with managing migraines ( 10/29/17)   Time 12    Period Weeks   Status New     PT LONG TERM GOAL #2   Title increase Rt cervical rotation =/> 75 degrees ( 10/29/17)   Time 12   Period Weeks   Status New     PT LONG TERM GOAL #3   Title improve FOTO =/< 34% limited. ( 10/29/17)    Baseline --   Time 12   Status New     PT LONG TERM GOAL #4   Title present with improved upright posture ( 10/29/17)    Time 12   Period Weeks   Status New                Plan - 08/06/17 1034    Clinical Impression Statement Leafy Ro has been doing pretty good managing her migraines and getting botox every three months.  She does have decreased cervical rotation and lots of muscular tightness.  She has postural changes that pull her anteriorly placing stress on the posterior chain.    Clinical Presentation Stable   Clinical Decision Making Low   Rehab Potential Good   PT Frequency Biweekly   PT Duration 12 weeks   PT Treatment/Interventions Moist Heat;Ultrasound;Traction;Therapeutic exercise;Dry needling;Taping;Manual techniques;Neuromuscular re-education;Cryotherapy;Electrical Stimulation;Iontophoresis 66m/ml Dexamethasone;Patient/family education   PT Next Visit Plan continue with DN, work into pecs and add in postural work   COncologistwith Plan of Care Patient      Patient will benefit from skilled therapeutic intervention in order to improve the following deficits and impairments:  Decreased range of motion, Increased muscle spasms, Postural dysfunction  Visit Diagnosis: Chronic tension-type headache, intractable - Plan: PT plan of care cert/re-cert  Other symptoms and signs involving the musculoskeletal system - Plan: PT plan of care cert/re-cert     Problem List Patient Active Problem List   Diagnosis Date Noted  . Essential hypertension 03/05/2017  . Dizziness 03/05/2017  . Hyperlipidemia 03/05/2017  . Plantar fasciitis of right foot 02/02/2016  . Chronic rhinitis 10/20/2015  . Neck pain 03/16/2015  . Overweight  01/22/2015  . Anxiety 07/14/2014  . Insomnia 05/28/2012  . Migraine 05/28/2012  . Muscle spasm 05/28/2012  . HEADACHE 04/07/2009    SJeral PinchPT  08/06/2017, 10:50 AM  [facility] [address]Phone: [phone]  Fax:  [phone] Name: ACORTNI TAYSMRN: [id number] of Birth: 601-23-1962  PHYSICAL THERAPY DISCHARGE SUMMARY  Visits from Start of Care: 1  Current functional level related to goals / functional outcomes: unknown   Remaining deficits: unknown   Education / Equipment: HEP  Plan:                                                    Patient goals were not met. Patient is being discharged due to not returning since the last visit.  ?????Pt uses multiple modalities to manage her migraines and has trigger point dry needling when they are really bad.  She has not returned.     Jeral Pinch, PT 10/24/17 11:04 AM

## 2017-08-07 ENCOUNTER — Other Ambulatory Visit: Payer: Self-pay | Admitting: Cardiovascular Disease

## 2017-08-07 MED FILL — AMLODIPINE BESYLATE 5 MG TA: 5 | 90 days supply | Qty: 90 | Fill #0

## 2017-08-07 MED FILL — ONDANSETRON HCL 8 MG TAB: 8 | 7 days supply | Qty: 20 | Fill #2

## 2017-08-07 NOTE — Telephone Encounter (Signed)
Refill Request.  

## 2017-08-10 ENCOUNTER — Encounter: Payer: Self-pay | Admitting: Family Medicine

## 2017-08-14 MED FILL — TOPIRAMATE 200 MG TABLET: 200 | 30 days supply | Qty: 30 | Fill #7

## 2017-08-21 MED FILL — ALPRAZolam 0.25 MG TABS: 0.25 | 30 days supply | Qty: 30 | Fill #0

## 2017-08-21 MED FILL — SERTRALINE HCL 100 MG TAB: 100 | 30 days supply | Qty: 30 | Fill #8

## 2017-08-23 ENCOUNTER — Encounter: Payer: 59 | Admitting: Family Medicine

## 2017-08-28 ENCOUNTER — Other Ambulatory Visit: Payer: Self-pay | Admitting: Family Medicine

## 2017-08-28 DIAGNOSIS — K219 Gastro-esophageal reflux disease without esophagitis: Secondary | ICD-10-CM

## 2017-08-28 MED FILL — CELECOXIB 200 MG CAPS: 200 | 30 days supply | Qty: 60 | Fill #4

## 2017-08-28 MED FILL — PANTOPRAZOLE SOD DR 40 MG T: 40 | 90 days supply | Qty: 90 | Fill #0

## 2017-09-18 ENCOUNTER — Encounter: Payer: Self-pay | Admitting: Allergy and Immunology

## 2017-09-18 ENCOUNTER — Ambulatory Visit (INDEPENDENT_AMBULATORY_CARE_PROVIDER_SITE_OTHER): Payer: 59 | Admitting: Allergy and Immunology

## 2017-09-18 DIAGNOSIS — R05 Cough: Secondary | ICD-10-CM | POA: Diagnosis not present

## 2017-09-18 DIAGNOSIS — J310 Chronic rhinitis: Secondary | ICD-10-CM | POA: Diagnosis not present

## 2017-09-18 DIAGNOSIS — R053 Chronic cough: Secondary | ICD-10-CM

## 2017-09-18 MED ORDER — RYVENT 6 MG PO TABS: 1.0000 | ORAL_TABLET | ORAL | 5 refills | Status: DC

## 2017-09-18 MED ORDER — FLUTICASONE PROPIONATE 93 MCG/ACT NA EXHU: 2.0000 | INHALANT_SUSPENSION | Freq: Two times a day (BID) | NASAL | 5 refills | Status: DC

## 2017-09-18 MED FILL — BACLOFEN 10 MG TABS: 10 | 20 days supply | Qty: 60 | Fill #2

## 2017-09-18 MED FILL — RYVENT 6 MG TABS: 6 | 15 days supply | Qty: 60 | Fill #0

## 2017-09-18 MED FILL — TOPIRAMATE 200 MG TABLET: 200 | 30 days supply | Qty: 30 | Fill #8

## 2017-09-18 NOTE — Assessment & Plan Note (Signed)
   A prescription has been provided for [facility], 2 actuations per nostril twice a day. Proper technique has been discussed and demonstrated.  I have also recommended nasal saline spray (i.e., Simply Saline) or nasal saline lavage (i.e., NeilMed) as needed and prior to medicated nasal sprays.  A prescription has been provided for RyVent (carbinoxamine maleate)  every 6-8 hours as needed.

## 2017-09-18 NOTE — Progress Notes (Signed)
Follow-up Note  RE: [name] MRN: [id number] DOB: [birth year] Date of Office Visit: 09/18/2017  Primary care provider: Zola Button, Grayling Congress, DO Referring provider: Zola Button, Grayling Congress, *  History of present illness: [name]" [name] is a 56 y.o. female with chronic rhinitis and history of persistent cough presenting today for follow up.  She was last seen in this clinic in November 2016.  She reports that she still experiences persistent nasal congestion and "always" has a cough.  The cough is described as an irritation/tickle at the base of her throat which he believes is secondary to postnasal drainage.  She has gastroesophageal reflux which is well-controlled with Protonix.  Prior to starting protonic she had experienced heartburn but currently does not.  She is currently using nasal saline rinse, however does not like using Dymista because of the taste of this medication.  She also would like for [place] to send in a prior authorization for [facility] because her insurance does not cover this medication.   Assessment and plan: Chronic rhinitis  A prescription has been provided for [facility], 2 actuations per nostril twice a day. Proper technique has been discussed and demonstrated.  I have also recommended nasal saline spray (i.e., Simply Saline) or nasal saline lavage (i.e., NeilMed) as needed and prior to medicated nasal sprays.  A prescription has been provided for RyVent (carbinoxamine maleate)  every 6-8 hours as needed.  Cough, persistent The patient's history and physical examination suggest upper airway cough syndrome.  We will aggressively treat postnasal drainage and evaluate results.  Treatment plan as outlined above.  If the coughing persists or progresses despite this plan, we will evaluate further.   Meds ordered this encounter  Medications  . RYVENT 6 MG TABS    Sig: Take 1 tablet by mouth See admin instructions. Every 6-8 hours as needed.   Dispense:  60 tablet    Refill:  5    Run [employer] Only BIN [id number]   RX PCN [id number]   Group [id number]   Cardholder ID    [id number]  . Fluticasone Propionate (XHANCE) 93 MCG/ACT EXHU    Sig: Place 2 sprays into the nose 2 (two) times daily.    Dispense:  32 mL    Refill:  5    Physical examination: Blood pressure 110/64, pulse 70, temperature 98.1 F (36.7 C), temperature source Oral, resp. rate 16, height  (1.626 m), weight 165 lb (74.8 kg), SpO2 98 %.  General: Alert, interactive, in no acute distress. HEENT: TMs pearly gray, turbinates moderately edematous without discharge, post-pharynx moderately erythematous. Neck: Supple without lymphadenopathy. Lungs: Clear to auscultation without wheezing, rhonchi or rales. CV: Normal S1, S2 without murmurs. Skin: Warm and dry, without lesions or rashes.  The following portions of the patient's history were reviewed and updated as appropriate: allergies, current medications, past family history, past medical history, past social history, past surgical history and problem list.  Allergies as of 09/18/2017   No Known Allergies     Medication List       Accurate as of 09/18/17 12:14 PM. Always use your most recent med list.          ALPRAZolam 0.25 MG tablet Commonly known as:  XANAX Take 1 tablet at bedtime as needed for sleep   amLODipine 5 MG tablet Commonly known as:  NORVASC Take 1 tablet (5 mg total) by mouth daily.   aspirin EC 81 MG tablet Take 81 mg  by mouth daily.   baclofen 10 MG tablet Commonly known as:  LIORESAL Take 1 tablet (10 mg total) by mouth every 8 (eight) hours as needed.   BOTOX 100 units Solr injection Generic drug:  botulinum toxin Type A INJECT 100 UNITS INTO THE MUSCLE ONCE EVERY 3 MONTHS   celecoxib 200 MG capsule Commonly known as:  CELEBREX TAKE 1 CAPSULE BY MOUTH 2 TIMES DAILY.   Fluticasone Propionate 93 MCG/ACT Exhu Commonly known as:  XHANCE Place 2 sprays into the nose 2 (two) times  daily.   HYDROcodone-acetaminophen 7.5-325 MG tablet Commonly known as:  NORCO Take 1 tablet by mouth every 6 (six) hours as needed.   ibuprofen 800 MG tablet Commonly known as:  ADVIL,MOTRIN Take 800 mg by mouth every 8 (eight) hours as needed.   naratriptan 2.5 MG tablet Commonly known as:  AMERGE Take 1 tablet (2.5 mg total) by mouth as needed. Take one (1) tablet at onset of headache; if returns or does not resolve, may repeat after 4 hours; do not exceed five (5) mg in 24 hours.   NONFORMULARY OR COMPOUNDED ITEM Dry needling  Dx Migraine, L hip pain   ondansetron 8 MG tablet Commonly known as:  ZOFRAN Take 1 tablet (8 mg total) by mouth every 8 (eight) hours as needed.   pantoprazole 40 MG tablet Commonly known as:  PROTONIX TAKE 1 TABLET BY MOUTH ONCE DAILY   rosuvastatin 40 MG tablet Commonly known as:  CRESTOR TAKE 1 TABLET BY MOUTH DAILY.   RYVENT 6 MG Tabs Generic drug:  Carbinoxamine Maleate Take 1 tablet by mouth See admin instructions. Every 6-8 hours as needed.   sertraline 100 MG tablet Commonly known as:  ZOLOFT Take 1 tablet (100 mg total) by mouth daily.   SUDAFED COUGH PO Take by mouth.   sulfamethoxazole-trimethoprim 800-160 MG tablet Commonly known as:  BACTRIM DS,SEPTRA DS   SUMAtriptan 100 MG tablet Commonly known as:  IMITREX Take 1 tablet (100 mg total) by mouth once as needed for migraine.   tiZANidine 4 MG tablet Commonly known as:  ZANAFLEX Take 1 tablet (4 mg total) by mouth 3 (three) times daily.   topiramate 200 MG tablet Commonly known as:  TOPAMAX Take 1 tablet (200 mg total) by mouth daily.   traZODone 50 MG tablet Commonly known as:  DESYREL Take 0.5-1 tablets (25-50 mg total) by mouth at bedtime as needed. for sleep       No Known Allergies  I appreciate the opportunity to take part in Willene's care. Please do not hesitate to contact me with questions.  Sincerely,   R. Jorene Guest, MD

## 2017-09-18 NOTE — Assessment & Plan Note (Addendum)
The patient's history and physical examination suggest upper airway cough syndrome. We will aggressively treat postnasal drainage and evaluate results.  Treatment plan as outlined above.  If the coughing persists or progresses despite this plan, we will evaluate further. 

## 2017-09-18 NOTE — Patient Instructions (Addendum)
Chronic rhinitis  A prescription has been provided for [facility], 2 actuations per nostril twice a day. Proper technique has been discussed and demonstrated.  I have also recommended nasal saline spray (i.e., Simply Saline) or nasal saline lavage (i.e., NeilMed) as needed and prior to medicated nasal sprays.  A prescription has been provided for RyVent (carbinoxamine maleate)  every 6-8 hours as needed.  Cough, persistent The patient's history and physical examination suggest upper airway cough syndrome.  We will aggressively treat postnasal drainage and evaluate results.  Treatment plan as outlined above.  If the coughing persists or progresses despite this plan, we will evaluate further.   Return in about 1 year (around 09/18/2018), or if symptoms worsen or fail to improve.

## 2017-09-19 ENCOUNTER — Other Ambulatory Visit: Payer: Self-pay | Admitting: Physician Assistant

## 2017-09-19 DIAGNOSIS — G43011 Migraine without aura, intractable, with status migrainosus: Secondary | ICD-10-CM

## 2017-09-20 ENCOUNTER — Telehealth: Payer: Self-pay | Admitting: Allergy & Immunology

## 2017-09-20 NOTE — Telephone Encounter (Signed)
wants to clarify a script that was called in

## 2017-09-21 MED FILL — NARATRIPTAN HCL 2.5 MG TAB: 2.5 | 20 days supply | Qty: 10 | Fill #0

## 2017-09-21 NOTE — Telephone Encounter (Signed)
Lm for pt to call us back  

## 2017-09-21 NOTE — Telephone Encounter (Signed)
Spoke with pt and she wanted to know if the xhance was approved. I told her to call [facility] to find out

## 2017-09-26 MED FILL — ALPRAZolam 0.25 MG TABS: 0.25 | 30 days supply | Qty: 30 | Fill #1

## 2017-09-26 MED FILL — SERTRALINE HCL 100 MG TAB: 100 | 30 days supply | Qty: 30 | Fill #9

## 2017-10-22 MED FILL — BACLOFEN 10 MG TABS: 10 | 20 days supply | Qty: 60 | Fill #3

## 2017-10-22 MED FILL — ONDANSETRON HCL 8 MG TAB: 8 | 7 days supply | Qty: 20 | Fill #3

## 2017-10-24 MED FILL — TOPIRAMATE 200 MG TABLET: 200 | 30 days supply | Qty: 30 | Fill #9

## 2017-10-26 MED FILL — CELECOXIB 200 MG CAPS: 200 | 30 days supply | Qty: 60 | Fill #5

## 2017-10-26 MED FILL — SERTRALINE HCL 100 MG TAB: 100 | 30 days supply | Qty: 30 | Fill #10

## 2017-10-29 ENCOUNTER — Other Ambulatory Visit: Payer: Self-pay | Admitting: Physician Assistant

## 2017-10-29 ENCOUNTER — Encounter: Payer: Self-pay | Admitting: Physician Assistant

## 2017-10-29 DIAGNOSIS — F419 Anxiety disorder, unspecified: Secondary | ICD-10-CM

## 2017-10-30 ENCOUNTER — Other Ambulatory Visit: Payer: Self-pay | Admitting: Obstetrics & Gynecology

## 2017-10-30 DIAGNOSIS — M542 Cervicalgia: Secondary | ICD-10-CM

## 2017-10-30 DIAGNOSIS — F419 Anxiety disorder, unspecified: Secondary | ICD-10-CM

## 2017-10-30 MED ORDER — ALPRAZOLAM 0.25 MG PO TABS: ORAL_TABLET | ORAL | 1 refills | Status: DC

## 2017-10-30 MED ORDER — HYDROCODONE-ACETAMINOPHEN 7.5-325 MG PO TABS: 1.0000 | ORAL_TABLET | Freq: Four times a day (QID) | ORAL | 0 refills | Status: DC | PRN

## 2017-10-30 MED FILL — HYDROCODON-APAP 7.5-325: 7.5-325 | 5 days supply | Qty: 20 | Fill #0

## 2017-10-30 MED FILL — ALPRAZolam 0.25 MG TABS: 0.25 | 30 days supply | Qty: 30 | Fill #0

## 2017-11-07 MED FILL — RYVENT 6 MG TABS: 6 | 15 days supply | Qty: 60 | Fill #1

## 2017-11-09 ENCOUNTER — Ambulatory Visit (INDEPENDENT_AMBULATORY_CARE_PROVIDER_SITE_OTHER): Payer: 59 | Admitting: Physician Assistant

## 2017-11-09 ENCOUNTER — Encounter: Payer: Self-pay | Admitting: Physician Assistant

## 2017-11-09 VITALS — BP 101/71 | HR 68

## 2017-11-09 DIAGNOSIS — G43009 Migraine without aura, not intractable, without status migrainosus: Secondary | ICD-10-CM

## 2017-11-09 DIAGNOSIS — G43011 Migraine without aura, intractable, with status migrainosus: Secondary | ICD-10-CM

## 2017-11-09 DIAGNOSIS — F419 Anxiety disorder, unspecified: Secondary | ICD-10-CM | POA: Diagnosis not present

## 2017-11-09 DIAGNOSIS — M62838 Other muscle spasm: Secondary | ICD-10-CM

## 2017-11-09 DIAGNOSIS — M542 Cervicalgia: Secondary | ICD-10-CM | POA: Diagnosis not present

## 2017-11-09 DIAGNOSIS — F439 Reaction to severe stress, unspecified: Secondary | ICD-10-CM | POA: Diagnosis not present

## 2017-11-09 MED ORDER — SERTRALINE HCL 100 MG PO TABS: 100.0000 mg | ORAL_TABLET | Freq: Every day | ORAL | 10 refills | Status: DC

## 2017-11-09 MED ORDER — ONDANSETRON HCL 8 MG PO TABS: 8.0000 mg | ORAL_TABLET | Freq: Three times a day (TID) | ORAL | 3 refills | Status: DC | PRN

## 2017-11-09 MED ORDER — HYDROCODONE-ACETAMINOPHEN 7.5-325 MG PO TABS: 1.0000 | ORAL_TABLET | Freq: Four times a day (QID) | ORAL | 0 refills | Status: DC | PRN

## 2017-11-09 MED ORDER — TIZANIDINE HCL 4 MG PO TABS: 4.0000 mg | ORAL_TABLET | Freq: Three times a day (TID) | ORAL | 2 refills | Status: DC

## 2017-11-09 MED ORDER — TOPIRAMATE 200 MG PO TABS: 200.0000 mg | ORAL_TABLET | Freq: Every day | ORAL | 11 refills | Status: DC

## 2017-11-09 MED ORDER — NARATRIPTAN HCL 2.5 MG PO TABS: ORAL_TABLET | ORAL | 11 refills | Status: DC

## 2017-11-09 MED ORDER — SUMATRIPTAN SUCCINATE 100 MG PO TABS: 100.0000 mg | ORAL_TABLET | Freq: Once | ORAL | 11 refills | Status: DC | PRN

## 2017-11-09 MED FILL — SUMATRIPTAN SUCC 100 MG TAB: 100 | 30 days supply | Qty: 8 | Fill #0

## 2017-11-09 MED FILL — tiZANidine HCL 4 MG TABS: 4 | 30 days supply | Qty: 90 | Fill #0

## 2017-11-09 MED FILL — NARATRIPTAN HCL 2.5 MG TAB: 2.5 | 30 days supply | Qty: 10 | Fill #0

## 2017-11-09 MED FILL — EMGALITY 120 MG/ML SOAJ: 120 | 30 days supply | Qty: 2 | Fill #0

## 2017-11-09 MED FILL — ONDANSETRON HCL 8 MG TAB: 8 | 6 days supply | Qty: 20 | Fill #0

## 2017-11-09 NOTE — Patient Instructions (Signed)
Botulinum Toxin Cosmetic Injection Botulinum toxin injection is a procedure to soften facial lines and wrinkles. Botulinum toxin is produced by a bacterium called Clostridium botulinum. The toxin is injected into muscles with a very thin needle. The toxin works by blocking nerve impulses to specific muscles. This weakens and temporarily paralyzes those muscles, which reduces the lines of facial expression. Tell a health care provider about:  Any allergies you have, especially allergies to eggs.  All medicines you are taking, including antibiotics, vitamins, herbs, eye drops, creams, and over-the-counter medicines.  Any problems you or family members have had with anesthetic medicines.  Any blood disorders you have.  Any surgeries you have had.  Any medical conditions you have, including neurologicaldisorders, hyperthyroidism, lung disease, or heart disease.  Whether you are pregnant, may be pregnant, or are breastfeeding. What are the risks? Generally, this is a safe procedure. However, problems may occur. Common problems include pain, swelling, and bruising at the injection sites. Other problems include:  Pain in the neck or jaw.  Infection.  Allergic reaction to the injection.  Damage to nearby structures or organs.  Drooping of the eyebrow or eyelid.  Double or blurred vision.  Changes in voice or speech.  Losing the ability to close one or both eyes.  Trouble swallowing.  What happens before the procedure?  Do not drink any alcohol as told by your health care provider.  Ask your health care provider about: ? Changing or stopping your regular medicines. This is especially important if you are taking diabetes medicines or blood thinners. ? Taking medicines such as aspirin and ibuprofen. These medicines can thin your blood. Do not take these medicines before your procedure if your health care provider instructs you not to.  Plan to have someone take you home after the  procedure, if told by your health care provider. What happens during the procedure?  You may be given a medicine to numb the area (local anesthetic).  Your health care provider will inject small amounts of the toxin into specific muscles. The number of injections that you get will depend on your specific condition and the area that is being treated. What happens after the procedure?  Follow up with your health care provider as directed. This information is not intended to replace advice given to you by your health care provider. Make sure you discuss any questions you have with your health care provider. Document Released: 08/24/2004 Document Revised: 05/04/2016 Document Reviewed: 06/02/2015 Elsevier Interactive Patient Education  2018 Elsevier Inc.  

## 2017-11-09 NOTE — Progress Notes (Signed)
S: Pt in office today for Botox injections. It has been longer than 3 months and she is no longer receiving benefit.  She has had a terrible time lately with her migraines.  She was told earlier this month that her marriage is over after 32 years.  She is exceptionally upset about this.  She is using xanax 3 times daily just to deal with every day life.  She has not yet seen a Veterinary surgeoncounselor.  She is using all of her migraine medications and needing more.  She wants botox today in addition to refills of her other medications.     O: BP 101/71   Pulse 68   Well developed, well nourished in no acute distress Skin is warm and dry without rash, lesion CN II-XII grossly intact Psych: Pt with appropriate affect, mood  Botox Procedure Note Vial of Botox was :Supplied by [employer] Lot # [id number] Expiration Date 02/2020  Botox Dosing by [employer] for Chronic Migraine   Injection Sites for Migraines  Botox 100 units was injected using the dosage in the table above in the pattern shown above.  A: Migraine - worsening Muscle spasm - unimproved Stress- new  P: Botox 100 units injected today.  Medications refilled but with limits.  Pt advised some of her medications are addictive.  She must limit her use of these and this office will not provide unlimited xanax and hydrocodone.  She must use these medications sparsely, or obtain them from somewhere else.   Will trial Emgality for migraine prevention.  My plan is to prevent them, and hopefully she will not need so many acute medications.   Pt advised to see PCP or psych for help with her anxiety She is also advised to see counselor right away. RTC 3 Months.

## 2017-11-13 MED FILL — HYDROCODON-APAP 7.5-325: 7.5-325 | 2 days supply | Qty: 10 | Fill #0

## 2017-11-15 ENCOUNTER — Other Ambulatory Visit: Payer: Self-pay

## 2017-11-15 ENCOUNTER — Emergency Department (INDEPENDENT_AMBULATORY_CARE_PROVIDER_SITE_OTHER)
Admission: EM | Admit: 2017-11-15 | Discharge: 2017-11-15 | Disposition: A | Discharge status: Home / Self Care | Payer: 59 | Source: Home / Self Care | Attending: Family Medicine | Admitting: Family Medicine

## 2017-11-15 ENCOUNTER — Encounter: Payer: Self-pay | Admitting: Family Medicine

## 2017-11-15 DIAGNOSIS — Z113 Encounter for screening for infections with a predominantly sexual mode of transmission: Secondary | ICD-10-CM

## 2017-11-15 NOTE — ED Provider Notes (Signed)
[facility]    CSN: [id number] Arrival date & time: 11/15/17  1716     History   Chief Complaint Chief Complaint  Patient presents with  . STD testing    HPI [name] is a 56 y.o. female.   HPI [name] is a 56 y.o. female presenting to UC with request for STI testing due to recently finding out her husband has been having an affair.  Pt denies having any symptoms but wants to be checked to be safe. Pt declines pelvic exam today.   Past Medical History:  Diagnosis Date  . Dizziness 03/05/2017  . Essential hypertension 03/05/2017  . Hyperlipidemia 03/05/2017  . Infertility, female   . Migraines     Patient Active Problem List   Diagnosis Date Noted  . Stress at home 11/09/2017  . Essential hypertension 03/05/2017  . Dizziness 03/05/2017  . Hyperlipidemia 03/05/2017  . Plantar fasciitis of right foot 02/02/2016  . Chronic rhinitis 10/20/2015  . Cough, persistent 10/20/2015  . Neck pain 03/16/2015  . Overweight 01/22/2015  . Anxiety 07/14/2014  . Insomnia 05/28/2012  . Migraine 05/28/2012  . Muscle spasm 05/28/2012  . HEADACHE 04/07/2009    Past Surgical History:  Procedure Laterality Date  . ANKLE SURGERY     Left  . SHOULDER SURGERY     Left    OB History    Gravida Para Term Preterm AB Living   1 1 1  0 0 1   SAB TAB Ectopic Multiple Live Births   0 0 0 0 1       Home Medications    Prior to Admission medications   Medication Sig Start Date End Date Taking? Authorizing Provider  ALPRAZolam Prudy Feeler(XANAX) 0.25 MG tablet Take 1 tablet at bedtime as needed for sleep 10/30/17   Anyanwu, Jethro BastosUgonna A, MD  amLODipine (NORVASC) 5 MG tablet Take 1 tablet (5 mg total) by mouth daily. 08/07/17   Chilton Siandolph, Tiffany, MD  aspirin EC 81 MG tablet Take 81 mg by mouth daily.    [provider]  baclofen (LIORESAL) 10 MG tablet Take 1 tablet (10 mg total) by mouth every 8 (eight) hours as needed. 04/06/17   Glyn Adeeague Clark, Scot JunKaren E, PA-C  BOTOX  100 units SOLR injection INJECT 100 UNITS INTO THE MUSCLE ONCE EVERY 3 MONTHS 07/13/17   Glyn Adeeague Clark, Scot JunKaren E, PA-C  celecoxib (CELEBREX) 200 MG capsule TAKE 1 CAPSULE BY MOUTH 2 TIMES DAILY. 03/23/17   Glyn Adeeague Clark, Scot JunKaren E, PA-C  Fluticasone Propionate Timmothy Sours(XHANCE) 93 MCG/ACT EXHU Place 2 sprays into the nose 2 (two) times daily. 09/18/17   Bobbitt, Heywood Ilesalph Carter, MD  HYDROcodone-acetaminophen (NORCO) 7.5-325 MG tablet Take 1 tablet by mouth every 6 (six) hours as needed. 11/09/17   Glyn Adeeague Clark, Scot JunKaren E, PA-C  ibuprofen (ADVIL,MOTRIN) 800 MG tablet Take 800 mg by mouth every 8 (eight) hours as needed.    [provider]  naratriptan (AMERGE) 2.5 MG tablet TAKE 1 TABLET BY MOUTH AT ONSET OF HEADACHE. IF RETURNS OR DOES NOT RESOLVE MAY REPEAT AFTER 4 HOURS. DO NOT EXCEED 2 TABLETS IN 24 HOURS 11/09/17   Glyn Adeeague Clark, Scot JunKaren E, PA-C  NONFORMULARY OR COMPOUNDED ITEM Dry needling  Dx Migraine, L hip pain 08/21/16   Zola ButtonLowne Chase, Grayling CongressYvonne R, DO  ondansetron (ZOFRAN) 8 MG tablet Take 1 tablet (8 mg total) by mouth every 8 (eight) hours as needed. 11/09/17   Glyn Adeeague Clark, Scot JunKaren E, PA-C  pantoprazole (PROTONIX) 40 MG  tablet TAKE 1 TABLET BY MOUTH ONCE DAILY 08/28/17   Zola ButtonLowne Chase, Yvonne R, DO  Pseudoephedrine-DM-GG (SUDAFED COUGH PO) Take by mouth.    [provider]  rosuvastatin (CRESTOR) 40 MG tablet TAKE 1 TABLET BY MOUTH DAILY. 07/16/17   Chilton Siandolph, Tiffany, MD  RYVENT 6 MG TABS Take 1 tablet by mouth See admin instructions. Every 6-8 hours as needed. 09/18/17   Bobbitt, Heywood Ilesalph Carter, MD  sertraline (ZOLOFT) 100 MG tablet Take 1 tablet (100 mg total) by mouth daily. 11/09/17   Glyn Adeeague Clark, Scot JunKaren E, PA-C  sulfamethoxazole-trimethoprim (BACTRIM DS,SEPTRA DS) 800-160 MG tablet  02/17/16   [provider]  SUMAtriptan (IMITREX) 100 MG tablet Take 1 tablet (100 mg total) by mouth once as needed for migraine. 11/09/17   Glyn Adeeague Clark, Scot JunKaren E, PA-C  tiZANidine (ZANAFLEX) 4 MG tablet Take 1 tablet (4  mg total) by mouth 3 (three) times daily. 11/09/17   Glyn Adeeague Clark, Scot JunKaren E, PA-C  topiramate (TOPAMAX) 200 MG tablet Take 1 tablet (200 mg total) by mouth daily. 11/09/17   Glyn Adeeague Clark, Scot JunKaren E, PA-C  traZODone (DESYREL) 50 MG tablet Take 0.5-1 tablets (25-50 mg total) by mouth at bedtime as needed. for sleep 08/21/16   Donato SchultzLowne Chase, Yvonne R, DO    Family History Family History  Problem Relation Age of Onset  . Hypertension Mother   . Allergic rhinitis Mother   . Cancer Maternal Grandmother   . Stroke Unknown   . Eczema Daughter   . Alcohol abuse Father   . Stroke Father   . Heart disease Maternal Grandfather   . Alcohol abuse Paternal Grandfather     Social History Social History   Tobacco Use  . Smoking status: Former Smoker    Types: Cigarettes    Last attempt to quit: 02/09/1992    Years since quitting: 25.7  . Smokeless tobacco: Never Used  Substance Use Topics  . Alcohol use: Yes    Comment: 1-2 weekly  . Drug use: No     Allergies   Patient has no known allergies.   Review of Systems Review of Systems  Constitutional: Negative for chills and fever.  Gastrointestinal: Negative for abdominal pain, diarrhea, nausea and vomiting.  Genitourinary: Negative for dysuria, frequency, vaginal discharge and vaginal pain.     Physical Exam Triage Vital Signs                                                Blood pressure 135/87, pulse 79, height 5\' 4"  (1.626 m), weight 160 lb (72.6 kg), SpO2 99 %.  No data found.    Physical Exam  Constitutional: She appears well-developed and well-nourished. No distress.  Neck: Normal range of motion.  Pulmonary/Chest: Effort normal. No respiratory distress.  Genitourinary:  Genitourinary Comments: Deferred   Skin: Skin is warm and dry. She is not diaphoretic.  Nursing note and vitals reviewed.    UC Treatments / Results  Labs (all labs ordered are listed, but only abnormal results are displayed) Labs  Reviewed  GC/CHLAMYDIA PROBE AMP  HIV ANTIBODY (ROUTINE TESTING)  RPR    EKG  EKG Interpretation None       Radiology No results found.  Procedures Procedures (including critical care time)  Medications Ordered in UC Medications - No data to display   Initial Impression / Assessment and Plan /  UC Course  I have reviewed the triage vital signs and the nursing notes.  Pertinent labs & imaging results that were available during my care of the patient were reviewed by me and considered in my medical decision making (see chart for details).     Samples sent to lab for HIV, Syphilis, GC/chlamydia (self-swab) Pt will be notified of results and treatment started if indicated.    Final Clinical Impressions(s) / UC Diagnoses   Final diagnoses:  Routine screening for STI (sexually transmitted infection)    ED Discharge Orders    None       Controlled Substance Prescriptions [place] Controlled Substance Registry consulted? Not Applicable   Rolla Plate 11/15/17 1744

## 2017-11-15 NOTE — Telephone Encounter (Signed)
We can give her the list of psychiatrist to call i'm happy to try to help with meds until she can get it with psych (because it may take a while)-- she would need an appointment .

## 2017-11-15 NOTE — ED Triage Notes (Signed)
Pt would like STD testing.

## 2017-11-16 LAB — C. TRACHOMATIS/N. GONORRHOEAE RNA
C. trachomatis RNA, TMA: NOT DETECTED
N. gonorrhoeae RNA, TMA: NOT DETECTED

## 2017-11-16 LAB — RPR: RPR Ser Ql: NONREACTIVE

## 2017-11-16 LAB — HIV ANTIBODY (ROUTINE TESTING W REFLEX): HIV 1&2 Ab, 4th Generation: NONREACTIVE

## 2017-11-22 DIAGNOSIS — F4323 Adjustment disorder with mixed anxiety and depressed mood: Secondary | ICD-10-CM | POA: Diagnosis not present

## 2017-11-26 MED FILL — SERTRALINE HCL 100 MG TAB: 100 | 30 days supply | Qty: 30 | Fill #0

## 2017-11-26 MED FILL — AMLODIPINE BESYLATE 5 MG TA: 5 | 90 days supply | Qty: 90 | Fill #1

## 2017-11-26 MED FILL — CELECOXIB 200 MG CAPS: 200 | 30 days supply | Qty: 60 | Fill #6

## 2017-11-27 MED FILL — ALPRAZolam 0.25 MG TABS: 0.25 | 30 days supply | Qty: 30 | Fill #1

## 2017-11-29 MED FILL — TOPIRAMATE 200 MG TABLET: 200 | 30 days supply | Qty: 30 | Fill #10

## 2017-12-13 ENCOUNTER — Encounter: Payer: Self-pay | Admitting: Family Medicine

## 2017-12-13 ENCOUNTER — Ambulatory Visit (INDEPENDENT_AMBULATORY_CARE_PROVIDER_SITE_OTHER): Payer: 59 | Admitting: Family Medicine

## 2017-12-13 VITALS — BP 132/102 | HR 84 | Temp 98.0°F | Ht 64.0 in | Wt 154.0 lb

## 2017-12-13 DIAGNOSIS — K219 Gastro-esophageal reflux disease without esophagitis: Secondary | ICD-10-CM

## 2017-12-13 DIAGNOSIS — I10 Essential (primary) hypertension: Secondary | ICD-10-CM

## 2017-12-13 DIAGNOSIS — Z79899 Other long term (current) drug therapy: Secondary | ICD-10-CM | POA: Diagnosis not present

## 2017-12-13 DIAGNOSIS — F419 Anxiety disorder, unspecified: Secondary | ICD-10-CM

## 2017-12-13 DIAGNOSIS — G43809 Other migraine, not intractable, without status migrainosus: Secondary | ICD-10-CM | POA: Diagnosis not present

## 2017-12-13 DIAGNOSIS — G4700 Insomnia, unspecified: Secondary | ICD-10-CM

## 2017-12-13 MED ORDER — METOPROLOL SUCCINATE ER 25 MG PO TB24: 25.0000 mg | ORAL_TABLET | Freq: Every day | ORAL | 3 refills | Status: DC

## 2017-12-13 MED ORDER — TRAZODONE HCL 50 MG PO TABS: 25.0000 mg | ORAL_TABLET | Freq: Every evening | ORAL | 1 refills | Status: DC | PRN

## 2017-12-13 MED ORDER — ALPRAZOLAM 0.5 MG PO TABS
0.5000 mg | ORAL_TABLET | Freq: Three times a day (TID) | ORAL | 1 refills | Status: DC | PRN
Start: 2017-12-13 — End: 2018-05-20

## 2017-12-13 MED ORDER — PANTOPRAZOLE SODIUM 40 MG PO TBEC: 40.0000 mg | DELAYED_RELEASE_TABLET | Freq: Every day | ORAL | 3 refills | Status: DC

## 2017-12-13 MED ORDER — SERTRALINE HCL 100 MG PO TABS: ORAL_TABLET | ORAL | 10 refills | Status: DC

## 2017-12-13 MED FILL — PANTOPRAZOLE SOD DR 40 MG T: 40 | 90 days supply | Qty: 90 | Fill #0

## 2017-12-13 MED FILL — METOPROLOL SUCC ER 25 MG TA: 25 | 90 days supply | Qty: 90 | Fill #0

## 2017-12-13 MED FILL — SERTRALINE HCL 100 MG TAB: 100 | 30 days supply | Qty: 60 | Fill #0

## 2017-12-13 MED FILL — traZODone HCL 50 MG TABS: 50 | 90 days supply | Qty: 90 | Fill #0

## 2017-12-13 MED FILL — ALPRAZolam 0.5 MG TABS: 0.5 | 15 days supply | Qty: 45 | Fill #0

## 2017-12-13 NOTE — Progress Notes (Signed)
Subjective:  I acted as a Neurosurgeon for [employer], CMA   Patient ID: [name], female    DOB: 12-04-1961, 57 y.o.   MRN: [id number]  Chief Complaint  Patient presents with  . Annual Exam  . Depression  . Anxiety    HPI  Patient is in today for worsening anxiety and migraines.  Her husband of >30 years left her for another women.  He has left her with nothing.  He is depressed and has had many medical problems.    Patient Care Team: Zola Button, Grayling Congress, DO as PCP - General (Family Medicine)   Past Medical History:  Diagnosis Date  . Dizziness 03/05/2017  . Essential hypertension 03/05/2017  . Hyperlipidemia 03/05/2017  . Infertility, female   . Migraines     Past Surgical History:  Procedure Laterality Date  . ANKLE SURGERY     Left  . SHOULDER SURGERY     Left    Family History  Problem Relation Age of Onset  . Hypertension Mother   . Allergic rhinitis Mother   . Cancer Maternal Grandmother   . Stroke Unknown   . Eczema Daughter   . Alcohol abuse Father   . Stroke Father   . Heart disease Maternal Grandfather   . Alcohol abuse Paternal Grandfather     Social History   Socioeconomic History  . Marital status: Married    Spouse name: Not on file  . Number of children: Not on file  . Years of education: Not on file  . Highest education level: Not on file  Social Needs  . Financial resource strain: Not on file  . Food insecurity - worry: Not on file  . Food insecurity - inability: Not on file  . Transportation needs - medical: Not on file  . Transportation needs - non-medical: Not on file  Occupational History    Employer: [employer]    Comment: [employer]  Tobacco Use  . Smoking status: Former Smoker    Types: Cigarettes    Last attempt to quit: 02/09/1992    Years since quitting: 25.8  . Smokeless tobacco: Never Used  Substance and Sexual Activity  . Alcohol use: Yes    Comment: 1-2 weekly  . Drug use: No  . Sexual  activity: Yes    Partners: Male  Other Topics Concern  . Not on file  Social History Narrative  . Not on file    Outpatient Medications Prior to Visit  Medication Sig Dispense Refill  . amLODipine (NORVASC) 5 MG tablet Take 1 tablet (5 mg total) by mouth daily. 90 tablet 3  . aspirin EC 81 MG tablet Take 81 mg by mouth daily.    . baclofen (LIORESAL) 10 MG tablet Take 1 tablet (10 mg total) by mouth every 8 (eight) hours as needed. 60 each 3  . BOTOX 100 units SOLR injection INJECT 100 UNITS INTO THE MUSCLE ONCE EVERY 3 MONTHS 1 each 3  . celecoxib (CELEBREX) 200 MG capsule TAKE 1 CAPSULE BY MOUTH 2 TIMES DAILY. 60 capsule 11  . Fluticasone Propionate (XHANCE) 93 MCG/ACT EXHU Place 2 sprays into the nose 2 (two) times daily. 32 mL 5  . HYDROcodone-acetaminophen (NORCO) 7.5-325 MG tablet Take 1 tablet by mouth every 6 (six) hours as needed. 10 tablet 0  . ibuprofen (ADVIL,MOTRIN) 800 MG tablet Take 800 mg by mouth every 8 (eight) hours as needed.    . naratriptan (AMERGE) 2.5 MG  tablet TAKE 1 TABLET BY MOUTH AT ONSET OF HEADACHE. IF RETURNS OR DOES NOT RESOLVE MAY REPEAT AFTER 4 HOURS. DO NOT EXCEED 2 TABLETS IN 24 HOURS 10 tablet 11  . NONFORMULARY OR COMPOUNDED ITEM Dry needling  Dx Migraine, L hip pain 1 each 0  . ondansetron (ZOFRAN) 8 MG tablet Take 1 tablet (8 mg total) by mouth every 8 (eight) hours as needed. 20 tablet 3  . Pseudoephedrine-DM-GG (SUDAFED COUGH PO) Take by mouth.    . rosuvastatin (CRESTOR) 40 MG tablet TAKE 1 TABLET BY MOUTH DAILY. 30 tablet 11  . RYVENT 6 MG TABS Take 1 tablet by mouth See admin instructions. Every 6-8 hours as needed. 60 tablet 5  . sulfamethoxazole-trimethoprim (BACTRIM DS,SEPTRA DS) 800-160 MG tablet   3  . SUMAtriptan (IMITREX) 100 MG tablet Take 1 tablet (100 mg total) by mouth once as needed for migraine. 9 tablet 11  . tiZANidine (ZANAFLEX) 4 MG tablet Take 1 tablet (4 mg total) by mouth 3 (three) times daily. 90 tablet 2  . topiramate  (TOPAMAX) 200 MG tablet Take 1 tablet (200 mg total) by mouth daily. 30 tablet 11  . ALPRAZolam (XANAX) 0.25 MG tablet Take 1 tablet at bedtime as needed for sleep 30 tablet 1  . pantoprazole (PROTONIX) 40 MG tablet TAKE 1 TABLET BY MOUTH ONCE DAILY 90 tablet 0  . sertraline (ZOLOFT) 100 MG tablet Take 1 tablet (100 mg total) by mouth daily. 30 tablet 10  . traZODone (DESYREL) 50 MG tablet Take 0.5-1 tablets (25-50 mg total) by mouth at bedtime as needed. for sleep 90 tablet 1   No facility-administered medications prior to visit.     No Known Allergies  Review of Systems  Constitutional: Negative for fever and malaise/fatigue.  HENT: Negative for congestion.   Eyes: Negative for blurred vision.  Respiratory: Negative for shortness of breath.   Cardiovascular: Negative for chest pain, palpitations and leg swelling.  Gastrointestinal: Negative for abdominal pain, blood in stool and nausea.  Genitourinary: Negative for dysuria and frequency.  Musculoskeletal: Negative for falls.  Skin: Negative for rash.  Neurological: Negative for dizziness, loss of consciousness and headaches.  Endo/Heme/Allergies: Negative for environmental allergies.  Psychiatric/Behavioral: Positive for depression. Negative for hallucinations, substance abuse and suicidal ideas. The patient is nervous/anxious and has insomnia.        Objective:    Physical Exam  Constitutional: She is oriented to person, place, and time. She appears well-developed and well-nourished.  HENT:  Head: Normocephalic and atraumatic.  Eyes: Conjunctivae and EOM are normal.  Neck: Normal range of motion. Neck supple. No JVD present. Carotid bruit is not present. No thyromegaly present.  Cardiovascular: Normal rate, regular rhythm and normal heart sounds.  No murmur heard. Pulmonary/Chest: Effort normal and breath sounds normal. No respiratory distress. She has no wheezes. She has no rales. She exhibits no tenderness.    Musculoskeletal: She exhibits no edema.  Neurological: She is alert and oriented to person, place, and time.  Psychiatric: Her speech is normal and behavior is normal. Judgment and thought content normal. Her mood appears anxious. Her affect is angry. Her affect is not blunt, not labile and not inappropriate. Cognition and memory are normal. She exhibits a depressed mood.  Nursing note and vitals reviewed.   BP (!) 132/102   Pulse 84   Temp 98 F (36.7 C) (Oral)   Ht 5\' 4"  (1.626 m)   Wt 154 lb (69.9 kg)   SpO2 97%  BMI 26.43 kg/m  Wt Readings from Last 3 Encounters:  12/13/17 154 lb (69.9 kg)  11/15/17 160 lb (72.6 kg)  09/18/17 165 lb (74.8 kg)   BP Readings from Last 3 Encounters:  12/13/17 (!) 132/102  11/15/17 135/87  11/09/17 101/71     Immunization History  Administered Date(s) Administered  . Influenza-Unspecified 11/11/2011, 10/11/2014    Health Maintenance  Topic Date Due  . Hepatitis C Screening  1961/07/08  . MAMMOGRAM  04/30/2015  . INFLUENZA VACCINE  07/11/2017  . TETANUS/TDAP  07/28/2018  . COLONOSCOPY  12/11/2018  . PAP SMEAR  08/22/2019  . HIV Screening  Completed    Lab Results  Component Value Date   WBC 7.4 01/17/2017   HGB 14.2 01/17/2017   HCT 41.2 01/17/2017   PLT 403.0 (H) 01/17/2017   GLUCOSE 96 05/30/2017   CHOL 162 05/30/2017   TRIG 102 05/30/2017   HDL 76 05/30/2017   LDLCALC 66 05/30/2017   ALT 37 (H) 05/30/2017   AST 28 05/30/2017   NA 144 05/30/2017   K 4.5 05/30/2017   CL 108 (H) 05/30/2017   CREATININE 0.82 05/30/2017   BUN 22 05/30/2017   CO2 20 05/30/2017   TSH 1.245 04/16/2015   HGBA1C 5.5 04/16/2015    Lab Results  Component Value Date   TSH 1.245 04/16/2015   Lab Results  Component Value Date   WBC 7.4 01/17/2017   HGB 14.2 01/17/2017   HCT 41.2 01/17/2017   MCV 97.5 01/17/2017   PLT 403.0 (H) 01/17/2017   Lab Results  Component Value Date   NA 144 05/30/2017   K 4.5 05/30/2017   CO2 20 05/30/2017    GLUCOSE 96 05/30/2017   BUN 22 05/30/2017   CREATININE 0.82 05/30/2017   BILITOT 0.3 05/30/2017   ALKPHOS 62 05/30/2017   AST 28 05/30/2017   ALT 37 (H) 05/30/2017   PROT 6.9 05/30/2017   ALBUMIN 4.3 05/30/2017   CALCIUM 9.0 05/30/2017   GFR 73.10 01/17/2017   Lab Results  Component Value Date   CHOL 162 05/30/2017   Lab Results  Component Value Date   HDL 76 05/30/2017   Lab Results  Component Value Date   LDLCALC 66 05/30/2017   Lab Results  Component Value Date   TRIG 102 05/30/2017   Lab Results  Component Value Date   CHOLHDL 2.1 05/30/2017   Lab Results  Component Value Date   HGBA1C 5.5 04/16/2015         Assessment & Plan:   Problem List Items Addressed This Visit      Unprioritized   Anxiety    Cont counseling  Inc zoloft 150 mg daily -- can inc to 200 mg daily after 2-3 months  Warned pt she can not take xanax and oxy together Names of psych given to pt to consider psych      Relevant Medications   sertraline (ZOLOFT) 100 MG tablet   traZODone (DESYREL) 50 MG tablet   ALPRAZolam (XANAX) 0.5 MG tablet   Essential hypertension    con't norvasc Add toprol xl 25 mg since pt has migraines as well  F/u 1 month or sooner prn       Relevant Medications   metoprolol succinate (TOPROL-XL) 25 MG 24 hr tablet   Insomnia   Relevant Medications   traZODone (DESYREL) 50 MG tablet   ALPRAZolam (XANAX) 0.5 MG tablet   Migraine - Primary    We are not able to rx  xanax and hydrocodone together Pt is aware con't meds from migraine dr       Relevant Medications   sertraline (ZOLOFT) 100 MG tablet   traZODone (DESYREL) 50 MG tablet   metoprolol succinate (TOPROL-XL) 25 MG 24 hr tablet    Other Visit Diagnoses    Gastroesophageal reflux disease, esophagitis presence not specified       Relevant Medications   pantoprazole (PROTONIX) 40 MG tablet    pt was here for >45 min with >50 % time discussing depression / anxeity Pt is not suicidal   Names and numbers of psych given to pt if needed   I have discontinued Marchelle Folks R. Boudoin's ALPRAZolam. I have also changed her sertraline and pantoprazole. Additionally, I am having her start on metoprolol succinate and ALPRAZolam. Lastly, I am having her maintain her sulfamethoxazole-trimethoprim, NONFORMULARY OR COMPOUNDED ITEM, ibuprofen, Pseudoephedrine-DM-GG (SUDAFED COUGH PO), aspirin EC, celecoxib, baclofen, BOTOX, rosuvastatin, amLODipine, RYVENT, Fluticasone Propionate, naratriptan, ondansetron, SUMAtriptan, tiZANidine, topiramate, HYDROcodone-acetaminophen, and traZODone.  Meds ordered this encounter  Medications  . sertraline (ZOLOFT) 100 MG tablet    Sig: 2 po qd    Dispense:  60 tablet    Refill:  10  . pantoprazole (PROTONIX) 40 MG tablet    Sig: Take 1 tablet (40 mg total) by mouth daily.    Dispense:  90 tablet    Refill:  3  . traZODone (DESYREL) 50 MG tablet    Sig: Take 0.5-1 tablets (25-50 mg total) by mouth at bedtime as needed. for sleep    Dispense:  90 tablet    Refill:  1  . metoprolol succinate (TOPROL-XL) 25 MG 24 hr tablet    Sig: Take 1 tablet (25 mg total) by mouth daily.    Dispense:  90 tablet    Refill:  3  . ALPRAZolam (XANAX) 0.5 MG tablet    Sig: Take 1 tablet (0.5 mg total) by mouth 3 (three) times daily as needed for anxiety or sleep.    Dispense:  45 tablet    Refill:  1    CMA served as scribe during this visit. History, Physical and Plan performed by medical provider. Documentation and orders reviewed and attested to.  Donato Schultz, DO

## 2017-12-13 NOTE — Assessment & Plan Note (Signed)
con't norvasc Add toprol xl 25 mg since pt has migraines as well  F/u 1 month or sooner prn

## 2017-12-13 NOTE — Addendum Note (Signed)
Addended by: Verdie ShireBAYNES, Nelvin Tomb M on: 12/13/2017 06:14 PM   Modules accepted: Orders

## 2017-12-13 NOTE — Assessment & Plan Note (Signed)
We are not able to rx xanax and hydrocodone together Pt is aware con't meds from migraine dr

## 2017-12-13 NOTE — Patient Instructions (Signed)

## 2017-12-13 NOTE — Assessment & Plan Note (Signed)
Cont counseling  Inc zoloft 150 mg daily -- can inc to 200 mg daily after 2-3 months  Warned pt she can not take xanax and oxy together Names of psych given to pt to consider psych

## 2017-12-18 LAB — PAIN MGMT, PROFILE 8 W/CONF, U
6 Acetylmorphine: NEGATIVE ng/mL (ref <10)
ALCOHOL METABOLITES: POSITIVE ng/mL — AB (ref <500)
ALPHAHYDROXYALPRAZOLAM: 56 ng/mL — AB (ref <25)
ALPHAHYDROXYMIDAZOLAM: NEGATIVE ng/mL (ref <50)
ALPHAHYDROXYTRIAZOLAM: NEGATIVE ng/mL (ref <50)
Aminoclonazepam: NEGATIVE ng/mL (ref <25)
Amphetamines: NEGATIVE ng/mL (ref <500)
BENZODIAZEPINES: POSITIVE ng/mL — AB (ref <100)
Buprenorphine, Urine: NEGATIVE ng/mL (ref <5)
CREATININE: 120.8 mg/dL
Cocaine Metabolite: NEGATIVE ng/mL (ref <150)
ETHYL GLUCURONIDE (ETG): 3593 ng/mL — AB (ref <500)
Ethyl Sulfate (ETS): 1883 ng/mL — ABNORMAL HIGH (ref <100)
HYDROXYETHYLFLURAZEPAM: NEGATIVE ng/mL (ref <50)
Lorazepam: NEGATIVE ng/mL (ref <50)
MARIJUANA METABOLITE: 11 ng/mL — AB (ref <5)
MARIJUANA METABOLITE: POSITIVE ng/mL — AB (ref <20)
MDMA: NEGATIVE ng/mL (ref <500)
NORDIAZEPAM: NEGATIVE ng/mL (ref <50)
OXIDANT: NEGATIVE ug/mL (ref <200)
Opiates: NEGATIVE ng/mL (ref <100)
Oxazepam: NEGATIVE ng/mL (ref <50)
Oxycodone: NEGATIVE ng/mL (ref <100)
PH: 5.78 (ref 4.5–9.0)
Temazepam: NEGATIVE ng/mL (ref <50)

## 2017-12-19 DIAGNOSIS — F4323 Adjustment disorder with mixed anxiety and depressed mood: Secondary | ICD-10-CM | POA: Diagnosis not present

## 2017-12-20 MED FILL — CLOTRIMAZOLE 10 MG TROCHE: 10 | 7 days supply | Qty: 35 | Fill #1

## 2017-12-25 MED FILL — TOPIRAMATE 200 MG TABLET: 200 | 30 days supply | Qty: 30 | Fill #0

## 2017-12-25 MED FILL — SUMATRIPTAN SUCC 100 MG TAB: 100 | 30 days supply | Qty: 8 | Fill #1

## 2017-12-25 MED FILL — NARATRIPTAN HCL 2.5 MG TAB: 2.5 | 30 days supply | Qty: 8 | Fill #1

## 2017-12-25 MED FILL — ONDANSETRON HCL 8 MG TAB: 8 | 6 days supply | Qty: 20 | Fill #1

## 2018-01-07 ENCOUNTER — Encounter: Payer: Self-pay | Admitting: Family Medicine

## 2018-01-08 MED FILL — CELECOXIB 200 MG CAPSULE: 200 | 30 days supply | Qty: 60 | Fill #7

## 2018-01-10 ENCOUNTER — Ambulatory Visit (INDEPENDENT_AMBULATORY_CARE_PROVIDER_SITE_OTHER): Payer: 59 | Admitting: Family Medicine

## 2018-01-10 ENCOUNTER — Encounter: Payer: Self-pay | Admitting: Family Medicine

## 2018-01-10 ENCOUNTER — Telehealth: Payer: Self-pay | Admitting: *Deleted

## 2018-01-10 VITALS — BP 136/86 | HR 61 | Temp 98.2°F | Resp 16 | Ht 64.0 in | Wt 150.4 lb

## 2018-01-10 DIAGNOSIS — F419 Anxiety disorder, unspecified: Secondary | ICD-10-CM

## 2018-01-10 NOTE — Progress Notes (Signed)
Patient ID: [name], female   DOB: 1961/08/08, 57 y.o.   MRN: [id number]    Subjective:  I acted as a Neurosurgeonscribe for Dr. Zola ButtonLowne-Chase.  Apolonio SchneidersSheketia, CMA   Patient ID: [name], female    DOB: 1961/08/08, 57 y.o.   MRN: [id number]  Chief Complaint  Patient presents with  . Migraine    HPI  Patient is in today for follow up migraine and anxiety.  She has been doing much better with inc dose of zoloft and is not needing xanax as much.  Pt has started exercising --- walking / jogging.   She fell last week and fell on her face.  She has bruised her nose/ eyes but is doing ok overall .   She was in [place] for a week and she felt like a new person after that.  [place] going to [place] for march to help her stop drinking and take care of herself.  It is a month long program.  She already has it approved at work.   She just needs fmla worked out.     Patient Care Team: Zola ButtonLowne Chase, Grayling CongressYvonne R, DO as PCP - General (Family Medicine)   Past Medical History:  Diagnosis Date  . Dizziness 03/05/2017  . Essential hypertension 03/05/2017  . Hyperlipidemia 03/05/2017  . Infertility, female   . Migraines     Past Surgical History:  Procedure Laterality Date  . ANKLE SURGERY     Left  . SHOULDER SURGERY     Left    Family History  Problem Relation Age of Onset  . Hypertension Mother   . Allergic rhinitis Mother   . Cancer Maternal Grandmother   . Stroke Unknown   . Eczema Daughter   . Alcohol abuse Father   . Stroke Father   . Heart disease Maternal Grandfather   . Alcohol abuse Paternal Grandfather     Social History   Socioeconomic History  . Marital status: Married    Spouse name: Not on file  . Number of children: Not on file  . Years of education: Not on file  . Highest education level: Not on file  Social Needs  . Financial resource strain: Not on file  . Food insecurity - worry: Not on file  . Food insecurity - inability: Not on file  . Transportation needs - medical: Not  on file  . Transportation needs - non-medical: Not on file  Occupational History    Employer: [employer]    Comment: [place] [employer] [facility]  Tobacco Use  . Smoking status: Former Smoker    Types: Cigarettes    Last attempt to quit: 02/09/1992    Years since quitting: 25.9  . Smokeless tobacco: Never Used  Substance and Sexual Activity  . Alcohol use: Yes    Comment: 1-2 weekly  . Drug use: No  . Sexual activity: Yes    Partners: Male  Other Topics Concern  . Not on file  Social History Narrative  . Not on file    Outpatient Medications Prior to Visit  Medication Sig Dispense Refill  . ALPRAZolam (XANAX) 0.5 MG tablet Take 1 tablet (0.5 mg total) by mouth 3 (three) times daily as needed for anxiety or sleep. 45 tablet 1  . amLODipine (NORVASC) 5 MG tablet Take 1 tablet (5 mg total) by mouth daily. 90 tablet 3  . aspirin EC 81 MG tablet Take 81 mg by mouth daily.    . baclofen (LIORESAL) 10  MG tablet Take 1 tablet (10 mg total) by mouth every 8 (eight) hours as needed. 60 each 3  . BOTOX 100 units SOLR injection INJECT 100 UNITS INTO THE MUSCLE ONCE EVERY 3 MONTHS 1 each 3  . celecoxib (CELEBREX) 200 MG capsule TAKE 1 CAPSULE BY MOUTH 2 TIMES DAILY. 60 capsule 11  . Fluticasone Propionate (XHANCE) 93 MCG/ACT EXHU Place 2 sprays into the nose 2 (two) times daily. 32 mL 5  . ibuprofen (ADVIL,MOTRIN) 800 MG tablet Take 800 mg by mouth every 8 (eight) hours as needed.    . metoprolol succinate (TOPROL-XL) 25 MG 24 hr tablet Take 1 tablet (25 mg total) by mouth daily. 90 tablet 3  . naratriptan (AMERGE) 2.5 MG tablet TAKE 1 TABLET BY MOUTH AT ONSET OF HEADACHE. IF RETURNS OR DOES NOT RESOLVE MAY REPEAT AFTER 4 HOURS. DO NOT EXCEED 2 TABLETS IN 24 HOURS 10 tablet 11  . NONFORMULARY OR COMPOUNDED ITEM Dry needling  Dx Migraine, L hip pain 1 each 0  . ondansetron (ZOFRAN) 8 MG tablet Take 1 tablet (8 mg total) by mouth every 8 (eight) hours as needed. 20 tablet 3  . pantoprazole  (PROTONIX) 40 MG tablet Take 1 tablet (40 mg total) by mouth daily. 90 tablet 3  . Pseudoephedrine-DM-GG (SUDAFED COUGH PO) Take by mouth.    . rosuvastatin (CRESTOR) 40 MG tablet TAKE 1 TABLET BY MOUTH DAILY. 30 tablet 11  . RYVENT 6 MG TABS Take 1 tablet by mouth See admin instructions. Every 6-8 hours as needed. 60 tablet 5  . sertraline (ZOLOFT) 100 MG tablet 2 po qd 60 tablet 10  . sulfamethoxazole-trimethoprim (BACTRIM DS,SEPTRA DS) 800-160 MG tablet   3  . SUMAtriptan (IMITREX) 100 MG tablet Take 1 tablet (100 mg total) by mouth once as needed for migraine. 9 tablet 11  . tiZANidine (ZANAFLEX) 4 MG tablet Take 1 tablet (4 mg total) by mouth 3 (three) times daily. 90 tablet 2  . topiramate (TOPAMAX) 200 MG tablet Take 1 tablet (200 mg total) by mouth daily. 30 tablet 11  . traZODone (DESYREL) 50 MG tablet Take 0.5-1 tablets (25-50 mg total) by mouth at bedtime as needed. for sleep 90 tablet 1  . HYDROcodone-acetaminophen (NORCO) 7.5-325 MG tablet Take 1 tablet by mouth every 6 (six) hours as needed. 10 tablet 0   No facility-administered medications prior to visit.     No Known Allergies  Review of Systems  Constitutional: Negative for chills, fever and malaise/fatigue.  HENT: Negative for congestion and hearing loss.   Eyes: Negative for discharge.  Respiratory: Negative for cough, sputum production and shortness of breath.   Cardiovascular: Negative for chest pain, palpitations and leg swelling.  Gastrointestinal: Negative for abdominal pain, blood in stool, constipation, diarrhea, heartburn, nausea and vomiting.  Genitourinary: Negative for dysuria, frequency, hematuria and urgency.  Musculoskeletal: Negative for back pain, falls and myalgias.  Skin: Negative for rash.  Neurological: Negative for dizziness, sensory change, loss of consciousness, weakness and headaches.  Endo/Heme/Allergies: Negative for environmental allergies. Does not bruise/bleed easily.    Psychiatric/Behavioral: Negative for depression and suicidal ideas. The patient is not nervous/anxious and does not have insomnia.        Objective:    Physical Exam  Constitutional: She is oriented to person, place, and time. She appears well-developed and well-nourished.  HENT:  Head: Normocephalic and atraumatic.  Eyes: Conjunctivae and EOM are normal.  Neck: Normal range of motion. Neck supple. No JVD present. Carotid  bruit is not present. No thyromegaly present.  Cardiovascular: Normal rate, regular rhythm and normal heart sounds.  No murmur heard. Pulmonary/Chest: Effort normal and breath sounds normal. No respiratory distress. She has no wheezes. She has no rales. She exhibits no tenderness.  Musculoskeletal: She exhibits no edema.  Neurological: She is alert and oriented to person, place, and time.  Psychiatric: She has a normal mood and affect.  Nursing note and vitals reviewed.   BP 136/86 (BP Location: Right Arm, Cuff Size: Normal)   Pulse 61   Temp 98.2 F (36.8 C) (Oral)   Resp 16   Ht 5\' 4"  (1.626 m)   Wt 150 lb 6.4 oz (68.2 kg)   SpO2 97%   BMI 25.82 kg/m  Wt Readings from Last 3 Encounters:  01/10/18 150 lb 6.4 oz (68.2 kg)  12/13/17 154 lb (69.9 kg)  11/15/17 160 lb (72.6 kg)   BP Readings from Last 3 Encounters:  01/10/18 136/86  12/13/17 (!) 132/102  11/15/17 135/87     Immunization History  Administered Date(s) Administered  . Influenza-Unspecified 11/11/2011, 10/11/2014    Health Maintenance  Topic Date Due  . Hepatitis C Screening  [birth year]  . MAMMOGRAM  04/30/2015  . INFLUENZA VACCINE  07/11/2017  . TETANUS/TDAP  07/28/2018  . COLONOSCOPY  12/11/2018  . PAP SMEAR  08/22/2019  . HIV Screening  Completed    Lab Results  Component Value Date   WBC 7.4 01/17/2017   HGB 14.2 01/17/2017   HCT 41.2 01/17/2017   PLT 403.0 (H) 01/17/2017   GLUCOSE 96 05/30/2017   CHOL 162 05/30/2017   TRIG 102 05/30/2017   HDL 76 05/30/2017    LDLCALC 66 05/30/2017   ALT 37 (H) 05/30/2017   AST 28 05/30/2017   NA 144 05/30/2017   K 4.5 05/30/2017   CL 108 (H) 05/30/2017   CREATININE 0.82 05/30/2017   BUN 22 05/30/2017   CO2 20 05/30/2017   TSH 1.245 04/16/2015   HGBA1C 5.5 04/16/2015    Lab Results  Component Value Date   TSH 1.245 04/16/2015   Lab Results  Component Value Date   WBC 7.4 01/17/2017   HGB 14.2 01/17/2017   HCT 41.2 01/17/2017   MCV 97.5 01/17/2017   PLT 403.0 (H) 01/17/2017   Lab Results  Component Value Date   NA 144 05/30/2017   K 4.5 05/30/2017   CO2 20 05/30/2017   GLUCOSE 96 05/30/2017   BUN 22 05/30/2017   CREATININE 0.82 05/30/2017   BILITOT 0.3 05/30/2017   ALKPHOS 62 05/30/2017   AST 28 05/30/2017   ALT 37 (H) 05/30/2017   PROT 6.9 05/30/2017   ALBUMIN 4.3 05/30/2017   CALCIUM 9.0 05/30/2017   GFR 73.10 01/17/2017   Lab Results  Component Value Date   CHOL 162 05/30/2017   Lab Results  Component Value Date   HDL 76 05/30/2017   Lab Results  Component Value Date   LDLCALC 66 05/30/2017   Lab Results  Component Value Date   TRIG 102 05/30/2017   Lab Results  Component Value Date   CHOLHDL 2.1 05/30/2017   Lab Results  Component Value Date   HGBA1C 5.5 04/16/2015         Assessment & Plan:   Problem List Items Addressed This Visit      Unprioritized   Anxiety - Primary    And depression with substance abuse  Pt going to a facility or rehab in March con't meds  F/u when she comes back in April         I have discontinued Dyanne Carrel. Fenech's HYDROcodone-acetaminophen. I am also having her maintain her sulfamethoxazole-trimethoprim, NONFORMULARY OR COMPOUNDED ITEM, ibuprofen, Pseudoephedrine-DM-GG (SUDAFED COUGH PO), aspirin EC, celecoxib, baclofen, BOTOX, rosuvastatin, amLODipine, RYVENT, Fluticasone Propionate, naratriptan, ondansetron, SUMAtriptan, tiZANidine, topiramate, sertraline, pantoprazole, traZODone, metoprolol succinate, and ALPRAZolam.  No  orders of the defined types were placed in this encounter.   CMA served as Neurosurgeon during this visit. History, Physical and Plan performed by medical provider. Documentation and orders reviewed and attested to.  Donato Schultz, DO

## 2018-01-10 NOTE — Patient Instructions (Signed)

## 2018-01-10 NOTE — Telephone Encounter (Signed)
Did you receive this patients FMLA paperwork?   "Your office should be receiving FMLA paperwork from an in-service treatment center called [name]. I plan to go for the month of March to be treated for alcohol abuse, traumatic loss, pain management and moving forward."

## 2018-01-11 NOTE — Assessment & Plan Note (Signed)
And depression with substance abuse  Pt going to a facility or rehab in March con't meds F/u when she comes back in April

## 2018-01-18 ENCOUNTER — Encounter: Payer: Self-pay | Admitting: Family Medicine

## 2018-01-21 ENCOUNTER — Telehealth: Payer: Self-pay

## 2018-01-21 ENCOUNTER — Encounter: Payer: Self-pay | Admitting: Physician Assistant

## 2018-01-21 NOTE — Telephone Encounter (Signed)
Entered in error

## 2018-01-21 NOTE — Telephone Encounter (Signed)
Completed as much as possible; forwarded to provider with OV notes/SLS 02/11

## 2018-01-22 ENCOUNTER — Telehealth: Payer: Self-pay | Admitting: *Deleted

## 2018-01-22 NOTE — Telephone Encounter (Signed)
Copied from CRM [phone]. Topic: General - Other >> Jan 21, 2018  9:36 AM Gerrianne ScalePayne, Angela L wrote: Reason for CRM: patient calling stating that she had faxed over [employer]/FMLA paper work last week and wanted to know if its ready pt work has to be sent into [employer] by 01-24-18  Paperwork received back by provider; faxed to [employer] this morning. Thanks/SLS 02/12

## 2018-01-23 ENCOUNTER — Encounter: Payer: Self-pay | Admitting: *Deleted

## 2018-01-23 DIAGNOSIS — L239 Allergic contact dermatitis, unspecified cause: Secondary | ICD-10-CM | POA: Diagnosis not present

## 2018-01-23 MED FILL — DESONIDE 0.05% OINTMENT: 0.05 | 10 days supply | Qty: 15 | Fill #0

## 2018-01-29 MED FILL — TOPIRAMATE 200 MG TABLET: 200 | 30 days supply | Qty: 30 | Fill #1

## 2018-01-29 MED FILL — ONDANSETRON HCL 8 MG TABLET: 8 | 6 days supply | Qty: 20 | Fill #2

## 2018-01-29 MED FILL — SERTRALINE HCL 100 MG TAB: 100 | 30 days supply | Qty: 60 | Fill #1

## 2018-01-29 NOTE — Telephone Encounter (Signed)
Paperwork has been faxed on 01/22/18//SLS

## 2018-02-01 MED FILL — ALPRAZolam 0.5 MG TABS: 0.5 | 15 days supply | Qty: 45 | Fill #1

## 2018-02-11 DIAGNOSIS — F332 Major depressive disorder, recurrent severe without psychotic features: Secondary | ICD-10-CM | POA: Diagnosis not present

## 2018-02-11 DIAGNOSIS — F1010 Alcohol abuse, uncomplicated: Secondary | ICD-10-CM | POA: Diagnosis not present

## 2018-02-11 DIAGNOSIS — F1020 Alcohol dependence, uncomplicated: Secondary | ICD-10-CM | POA: Diagnosis not present

## 2018-02-11 DIAGNOSIS — F411 Generalized anxiety disorder: Secondary | ICD-10-CM | POA: Diagnosis not present

## 2018-02-11 MED FILL — BACLOFEN 10 MG TABS: 10 | 20 days supply | Qty: 60 | Fill #0

## 2018-02-11 MED FILL — ESTRADIOL 0.1 MG/GM CREA: 0.1 | 90 days supply | Qty: 43 | Fill #1

## 2018-03-14 DIAGNOSIS — F1020 Alcohol dependence, uncomplicated: Secondary | ICD-10-CM | POA: Diagnosis not present

## 2018-03-15 DIAGNOSIS — F1020 Alcohol dependence, uncomplicated: Secondary | ICD-10-CM | POA: Diagnosis not present

## 2018-03-17 DIAGNOSIS — F1010 Alcohol abuse, uncomplicated: Secondary | ICD-10-CM | POA: Diagnosis not present

## 2018-03-18 DIAGNOSIS — F1020 Alcohol dependence, uncomplicated: Secondary | ICD-10-CM | POA: Diagnosis not present

## 2018-03-19 DIAGNOSIS — F1020 Alcohol dependence, uncomplicated: Secondary | ICD-10-CM | POA: Diagnosis not present

## 2018-03-20 DIAGNOSIS — F1020 Alcohol dependence, uncomplicated: Secondary | ICD-10-CM | POA: Diagnosis not present

## 2018-03-20 DIAGNOSIS — F1010 Alcohol abuse, uncomplicated: Secondary | ICD-10-CM | POA: Diagnosis not present

## 2018-03-21 DIAGNOSIS — F1020 Alcohol dependence, uncomplicated: Secondary | ICD-10-CM | POA: Diagnosis not present

## 2018-03-22 DIAGNOSIS — F1020 Alcohol dependence, uncomplicated: Secondary | ICD-10-CM | POA: Diagnosis not present

## 2018-03-24 DIAGNOSIS — F1010 Alcohol abuse, uncomplicated: Secondary | ICD-10-CM | POA: Diagnosis not present

## 2018-03-25 DIAGNOSIS — F1020 Alcohol dependence, uncomplicated: Secondary | ICD-10-CM | POA: Diagnosis not present

## 2018-03-26 DIAGNOSIS — F1020 Alcohol dependence, uncomplicated: Secondary | ICD-10-CM | POA: Diagnosis not present

## 2018-03-27 DIAGNOSIS — F4320 Adjustment disorder, unspecified: Secondary | ICD-10-CM | POA: Diagnosis not present

## 2018-03-27 DIAGNOSIS — F1020 Alcohol dependence, uncomplicated: Secondary | ICD-10-CM | POA: Diagnosis not present

## 2018-03-28 DIAGNOSIS — F1020 Alcohol dependence, uncomplicated: Secondary | ICD-10-CM | POA: Diagnosis not present

## 2018-03-29 DIAGNOSIS — M9901 Segmental and somatic dysfunction of cervical region: Secondary | ICD-10-CM | POA: Diagnosis not present

## 2018-03-29 DIAGNOSIS — F1020 Alcohol dependence, uncomplicated: Secondary | ICD-10-CM | POA: Diagnosis not present

## 2018-03-29 DIAGNOSIS — M9903 Segmental and somatic dysfunction of lumbar region: Secondary | ICD-10-CM | POA: Diagnosis not present

## 2018-03-29 DIAGNOSIS — M9902 Segmental and somatic dysfunction of thoracic region: Secondary | ICD-10-CM | POA: Diagnosis not present

## 2018-03-29 DIAGNOSIS — M5011 Cervical disc disorder with radiculopathy,  high cervical region: Secondary | ICD-10-CM | POA: Diagnosis not present

## 2018-04-01 DIAGNOSIS — F1020 Alcohol dependence, uncomplicated: Secondary | ICD-10-CM | POA: Diagnosis not present

## 2018-04-02 DIAGNOSIS — M9902 Segmental and somatic dysfunction of thoracic region: Secondary | ICD-10-CM | POA: Diagnosis not present

## 2018-04-02 DIAGNOSIS — F1020 Alcohol dependence, uncomplicated: Secondary | ICD-10-CM | POA: Diagnosis not present

## 2018-04-02 DIAGNOSIS — M5011 Cervical disc disorder with radiculopathy,  high cervical region: Secondary | ICD-10-CM | POA: Diagnosis not present

## 2018-04-02 DIAGNOSIS — M9903 Segmental and somatic dysfunction of lumbar region: Secondary | ICD-10-CM | POA: Diagnosis not present

## 2018-04-02 DIAGNOSIS — M9901 Segmental and somatic dysfunction of cervical region: Secondary | ICD-10-CM | POA: Diagnosis not present

## 2018-04-08 ENCOUNTER — Other Ambulatory Visit: Payer: Self-pay | Admitting: Physician Assistant

## 2018-04-08 DIAGNOSIS — M542 Cervicalgia: Secondary | ICD-10-CM

## 2018-04-08 DIAGNOSIS — G4489 Other headache syndrome: Secondary | ICD-10-CM

## 2018-04-08 MED FILL — PANTOPRAZOLE SOD DR 40 MG T: 40 | 90 days supply | Qty: 90 | Fill #1

## 2018-04-08 MED FILL — ROSUVASTATIN CALCIUM 40 MG: 40 | 90 days supply | Qty: 90 | Fill #1

## 2018-04-08 MED FILL — AMLODIPINE BESYLATE 5 MG TA: 5 | 90 days supply | Qty: 90 | Fill #2

## 2018-04-08 MED FILL — NARATRIPTAN HCL 2.5 MG TAB: 2.5 | 30 days supply | Qty: 8 | Fill #2

## 2018-04-08 MED FILL — tiZANidine HCL 4 MG TABS: 4 | 30 days supply | Qty: 90 | Fill #1

## 2018-04-08 MED FILL — BACLOFEN 10 MG TABLET: 10 | 20 days supply | Qty: 60 | Fill #1

## 2018-04-10 ENCOUNTER — Other Ambulatory Visit: Payer: Self-pay

## 2018-04-10 NOTE — Telephone Encounter (Signed)
Patient is requesting a refill on celebrex

## 2018-04-19 ENCOUNTER — Other Ambulatory Visit: Payer: Self-pay

## 2018-05-10 MED FILL — TOPIRAMATE 200 MG TABLET: 200 | 30 days supply | Qty: 30 | Fill #2

## 2018-05-10 MED FILL — SERTRALINE HCL 100 MG TAB: 100 | 30 days supply | Qty: 60 | Fill #2

## 2018-05-20 ENCOUNTER — Encounter: Payer: Self-pay | Admitting: Family Medicine

## 2018-05-20 ENCOUNTER — Ambulatory Visit (INDEPENDENT_AMBULATORY_CARE_PROVIDER_SITE_OTHER): Payer: 59 | Admitting: Family Medicine

## 2018-05-20 VITALS — BP 123/73 | HR 54 | Temp 97.9°F | Resp 16 | Ht 64.17 in | Wt 156.4 lb

## 2018-05-20 DIAGNOSIS — I10 Essential (primary) hypertension: Secondary | ICD-10-CM

## 2018-05-20 DIAGNOSIS — G43809 Other migraine, not intractable, without status migrainosus: Secondary | ICD-10-CM

## 2018-05-20 DIAGNOSIS — F419 Anxiety disorder, unspecified: Secondary | ICD-10-CM

## 2018-05-20 DIAGNOSIS — E7800 Pure hypercholesterolemia, unspecified: Secondary | ICD-10-CM

## 2018-05-20 MED ORDER — NONFORMULARY OR COMPOUNDED ITEM: Status: AC

## 2018-05-20 MED ORDER — TOPIRAMATE 200 MG PO TABS: 200.0000 mg | ORAL_TABLET | Freq: Every day | ORAL | 5 refills | Status: DC

## 2018-05-20 MED ORDER — SERTRALINE HCL 50 MG PO TABS: 50.0000 mg | ORAL_TABLET | Freq: Every day | ORAL | 3 refills | Status: DC

## 2018-05-20 MED ORDER — HYDROXYZINE PAMOATE 100 MG PO CAPS: 100.0000 mg | ORAL_CAPSULE | Freq: Three times a day (TID) | ORAL | 0 refills | Status: DC | PRN

## 2018-05-20 NOTE — Assessment & Plan Note (Signed)
Well controlled, no changes to meds. Encouraged heart healthy diet such as the DASH diet and exercise as tolerated.  Pt see cardiology

## 2018-05-20 NOTE — Progress Notes (Signed)
Subjective:  I acted as a Neurosurgeonscribe for CMS Energy CorporationDr.Lowne-Chase. [name], RMA   Patient ID: [name] R [name], female    DOB: [birth year], 59 y.o.   MRN: [id number]  Chief Complaint  Patient presents with  . Anxiety    HPI  Patient is in today for follow up on anxiety.   She has just gotten back from [place] -- rehab -- she is now going to [facility] [address].  -- [name]  She is doing well --- she will f/u with headache specialist in [place].    Patient Care Team: Zola ButtonLowne Chase, Grayling CongressYvonne R, DO as PCP - General (Family Medicine) Glyn Adeeague Clark, Scot JunKaren E, PA-C as Physician Assistant (Obstetrics and Gynecology) Chilton Siandolph, Tiffany, MD as Attending Physician (Cardiology) --headaches   Past Medical History:  Diagnosis Date  . Dizziness 03/05/2017  . Essential hypertension 03/05/2017  . Hyperlipidemia 03/05/2017  . Infertility, female   . Migraines     Past Surgical History:  Procedure Laterality Date  . ANKLE SURGERY     Left  . SHOULDER SURGERY     Left    Family History  Problem Relation Age of Onset  . Hypertension Mother   . Allergic rhinitis Mother   . Cancer Maternal Grandmother   . Stroke Unknown   . Eczema Daughter   . Alcohol abuse Father   . Stroke Father   . Heart disease Maternal Grandfather   . Alcohol abuse Paternal Grandfather     Social History   Socioeconomic History  . Marital status: Married    Spouse name: Not on file  . Number of children: Not on file  . Years of education: Not on file  . Highest education level: Not on file  Occupational History    Employer: [employer]    Comment:  urgent care  Social Needs  . Financial resource strain: Not on file  . Food insecurity:    Worry: Not on file    Inability: Not on file  . Transportation needs:    Medical: Not on file    Non-medical: Not on file  Tobacco Use  . Smoking status: Former Smoker    Types: Cigarettes    Last attempt to quit: 02/09/1992    Years since quitting: 26.2  .  Smokeless tobacco: Never Used  Substance and Sexual Activity  . Alcohol use: Yes    Comment: 1-2 weekly  . Drug use: No  . Sexual activity: Yes    Partners: Male  Lifestyle  . Physical activity:    Days per week: Not on file    Minutes per session: Not on file  . Stress: Not on file  Relationships  . Social connections:    Talks on phone: Not on file    Gets together: Not on file    Attends religious service: Not on file    Active member of club or organization: Not on file    Attends meetings of clubs or organizations: Not on file    Relationship status: Not on file  . Intimate partner violence:    Fear of current or ex partner: Not on file    Emotionally abused: Not on file    Physically abused: Not on file    Forced sexual activity: Not on file  Other Topics Concern  . Not on file  Social History Narrative  . Not on file    Outpatient Medications Prior to Visit  Medication Sig Dispense Refill  . amLODipine (NORVASC) 5 MG tablet  Take 1 tablet (5 mg total) by mouth daily. 90 tablet 3  . aspirin EC 81 MG tablet Take 81 mg by mouth daily.    . baclofen (LIORESAL) 10 MG tablet Take 1 tablet (10 mg total) by mouth every 8 (eight) hours as needed. 60 each 3  . BOTOX 100 units SOLR injection INJECT 100 UNITS INTO THE MUSCLE ONCE EVERY 3 MONTHS 1 each 3  . celecoxib (CELEBREX) 200 MG capsule TAKE 1 CAPSULE BY MOUTH 2 TIMES DAILY. 60 capsule 11  . Fluticasone Propionate (XHANCE) 93 MCG/ACT EXHU Place 2 sprays into the nose 2 (two) times daily. 32 mL 5  . ibuprofen (ADVIL,MOTRIN) 800 MG tablet Take 800 mg by mouth every 8 (eight) hours as needed.    . magnesium oxide (MAG-OX) 400 MG tablet Take 400 mg by mouth daily.    . metoprolol succinate (TOPROL-XL) 25 MG 24 hr tablet Take 1 tablet (25 mg total) by mouth daily. 90 tablet 3  . naratriptan (AMERGE) 2.5 MG tablet TAKE 1 TABLET BY MOUTH AT ONSET OF HEADACHE. IF RETURNS OR DOES NOT RESOLVE MAY REPEAT AFTER 4 HOURS. DO NOT EXCEED 2  TABLETS IN 24 HOURS 10 tablet 11  . ondansetron (ZOFRAN) 8 MG tablet Take 1 tablet (8 mg total) by mouth every 8 (eight) hours as needed. 20 tablet 3  . pantoprazole (PROTONIX) 40 MG tablet Take 1 tablet (40 mg total) by mouth daily. 90 tablet 3  . Pseudoephedrine-DM-GG (SUDAFED COUGH PO) Take by mouth.    . rosuvastatin (CRESTOR) 40 MG tablet TAKE 1 TABLET BY MOUTH DAILY. 30 tablet 11  . RYVENT 6 MG TABS Take 1 tablet by mouth See admin instructions. Every 6-8 hours as needed. 60 tablet 5  . SUMAtriptan (IMITREX) 100 MG tablet Take 1 tablet (100 mg total) by mouth once as needed for migraine. 9 tablet 11  . tiZANidine (ZANAFLEX) 4 MG tablet Take 1 tablet (4 mg total) by mouth 3 (three) times daily. 90 tablet 2  . traZODone (DESYREL) 50 MG tablet Take 0.5-1 tablets (25-50 mg total) by mouth at bedtime as needed. for sleep 90 tablet 1  . vitamin B-12 (CYANOCOBALAMIN) 500 MCG tablet Take 500 mcg by mouth daily.    . NONFORMULARY OR COMPOUNDED ITEM Dry needling  Dx Migraine, L hip pain 1 each 0  . sertraline (ZOLOFT) 100 MG tablet 2 po qd 60 tablet 10  . sulfamethoxazole-trimethoprim (BACTRIM DS,SEPTRA DS) 800-160 MG tablet   3  . topiramate (TOPAMAX) 200 MG tablet Take 1 tablet (200 mg total) by mouth daily. 30 tablet 11  . ALPRAZolam (XANAX) 0.5 MG tablet Take 1 tablet (0.5 mg total) by mouth 3 (three) times daily as needed for anxiety or sleep. 45 tablet 1  . topiramate (TOPAMAX) 200 MG tablet Take 1 tablet (200 mg total) by mouth 2 (two) times daily. 60 tablet    No facility-administered medications prior to visit.     No Known Allergies  Review of Systems  Constitutional: Negative for chills, fever and malaise/fatigue.  HENT: Negative for congestion and hearing loss.   Eyes: Negative for discharge.  Respiratory: Negative for cough, sputum production and shortness of breath.   Cardiovascular: Negative for chest pain, palpitations and leg swelling.  Gastrointestinal: Negative for  abdominal pain, blood in stool, constipation, diarrhea, heartburn, nausea and vomiting.  Genitourinary: Negative for dysuria, frequency, hematuria and urgency.  Musculoskeletal: Negative for back pain, falls and myalgias.  Skin: Negative for rash.  Neurological: Negative  for dizziness, sensory change, loss of consciousness, weakness and headaches.  Endo/Heme/Allergies: Negative for environmental allergies. Does not bruise/bleed easily.  Psychiatric/Behavioral: Negative for depression and suicidal ideas. The patient is not nervous/anxious and does not have insomnia.        Objective:    Physical Exam  Constitutional: She is oriented to person, place, and time. She appears well-developed and well-nourished.  HENT:  Head: Normocephalic and atraumatic.  Eyes: Conjunctivae and EOM are normal.  Neck: Normal range of motion. Neck supple. No JVD present. Carotid bruit is not present. No thyromegaly present.  Cardiovascular: Normal rate, regular rhythm and normal heart sounds.  No murmur heard. Pulmonary/Chest: Effort normal and breath sounds normal. No respiratory distress. She has no wheezes. She has no rales. She exhibits no tenderness.  Musculoskeletal: She exhibits no edema.  Neurological: She is alert and oriented to person, place, and time.  Psychiatric: She has a normal mood and affect.  Nursing note and vitals reviewed.   BP 123/73 (BP Location: Left Arm, Patient Position: Sitting, Cuff Size: Normal)   Pulse (!) 54   Temp 97.9 F (36.6 C) (Oral)   Resp 16   Ht 5' 4.17" (1.63 m)   Wt 156 lb 6.4 oz (70.9 kg)   SpO2 99%   BMI 26.70 kg/m  Wt Readings from Last 3 Encounters:  05/20/18 156 lb 6.4 oz (70.9 kg)  01/10/18 150 lb 6.4 oz (68.2 kg)  12/13/17 154 lb (69.9 kg)   BP Readings from Last 3 Encounters:  05/20/18 123/73  01/10/18 136/86  12/13/17 (!) 132/102     Immunization History  Administered Date(s) Administered  . Influenza-Unspecified 11/11/2011, 10/11/2014      Health Maintenance  Topic Date Due  . Hepatitis C Screening  [birth year]  . MAMMOGRAM  04/30/2015  . INFLUENZA VACCINE  07/11/2018  . TETANUS/TDAP  07/28/2018  . COLONOSCOPY  12/11/2018  . PAP SMEAR  08/22/2019  . HIV Screening  Completed    Lab Results  Component Value Date   WBC 7.4 01/17/2017   HGB 14.2 01/17/2017   HCT 41.2 01/17/2017   PLT 403.0 (H) 01/17/2017   GLUCOSE 96 05/30/2017   CHOL 162 05/30/2017   TRIG 102 05/30/2017   HDL 76 05/30/2017   LDLCALC 66 05/30/2017   ALT 37 (H) 05/30/2017   AST 28 05/30/2017   NA 144 05/30/2017   K 4.5 05/30/2017   CL 108 (H) 05/30/2017   CREATININE 0.82 05/30/2017   BUN 22 05/30/2017   CO2 20 05/30/2017   TSH 1.245 04/16/2015   HGBA1C 5.5 04/16/2015    Lab Results  Component Value Date   TSH 1.245 04/16/2015   Lab Results  Component Value Date   WBC 7.4 01/17/2017   HGB 14.2 01/17/2017   HCT 41.2 01/17/2017   MCV 97.5 01/17/2017   PLT 403.0 (H) 01/17/2017   Lab Results  Component Value Date   NA 144 05/30/2017   K 4.5 05/30/2017   CO2 20 05/30/2017   GLUCOSE 96 05/30/2017   BUN 22 05/30/2017   CREATININE 0.82 05/30/2017   BILITOT 0.3 05/30/2017   ALKPHOS 62 05/30/2017   AST 28 05/30/2017   ALT 37 (H) 05/30/2017   PROT 6.9 05/30/2017   ALBUMIN 4.3 05/30/2017   CALCIUM 9.0 05/30/2017   GFR 73.10 01/17/2017   Lab Results  Component Value Date   CHOL 162 05/30/2017   Lab Results  Component Value Date   HDL 76 05/30/2017   Lab  Results  Component Value Date   LDLCALC 66 05/30/2017   Lab Results  Component Value Date   TRIG 102 05/30/2017   Lab Results  Component Value Date   CHOLHDL 2.1 05/30/2017   Lab Results  Component Value Date   HGBA1C 5.5 04/16/2015         Assessment & Plan:   Problem List Items Addressed This Visit      Unprioritized   Anxiety - Primary    F/u emdr  con't meds  Pt doing well       Relevant Medications   sertraline (ZOLOFT) 50 MG tablet   hydrOXYzine  (VISTARIL) 100 MG capsule   NONFORMULARY OR COMPOUNDED ITEM   Essential hypertension    Well controlled, no changes to meds. Encouraged heart healthy diet such as the DASH diet and exercise as tolerated.  Pt see cardiology      Hyperlipidemia    Tolerating statin, encouraged heart healthy diet, avoid trans fats, minimize simple carbs and saturated fats. Increase exercise as tolerated--- pt sees cardiology      Migraine    F/u ha specialist Acupuncture, chiropractic Massage rto prn      Relevant Medications   sertraline (ZOLOFT) 50 MG tablet   topiramate (TOPAMAX) 200 MG tablet      I have discontinued Marchelle Folks R. Grundman's sulfamethoxazole-trimethoprim, NONFORMULARY OR COMPOUNDED ITEM, topiramate, sertraline, and ALPRAZolam. I have also changed her topiramate. Additionally, I am having her start on sertraline, hydrOXYzine, and NONFORMULARY OR COMPOUNDED ITEM. Lastly, I am having her maintain her ibuprofen, Pseudoephedrine-DM-GG (SUDAFED COUGH PO), aspirin EC, baclofen, BOTOX, rosuvastatin, amLODipine, RYVENT, Fluticasone Propionate, naratriptan, ondansetron, SUMAtriptan, tiZANidine, pantoprazole, traZODone, metoprolol succinate, celecoxib, magnesium oxide, and vitamin B-12.  Meds ordered this encounter  Medications  . sertraline (ZOLOFT) 50 MG tablet    Sig: Take 1 tablet (50 mg total) by mouth daily.    Dispense:  30 tablet    Refill:  3  . hydrOXYzine (VISTARIL) 100 MG capsule    Sig: Take 1 capsule (100 mg total) by mouth 3 (three) times daily as needed for itching.    Dispense:  30 capsule    Refill:  0  . NONFORMULARY OR COMPOUNDED ITEM    Sig: Gaba Calm as needed for anxiety  . topiramate (TOPAMAX) 200 MG tablet    Sig: Take 1 tablet (200 mg total) by mouth daily.    Dispense:  30 tablet    Refill:  5    CMA served as scribe during this visit. History, Physical and Plan performed by medical provider. Documentation and orders reviewed and attested to.  Donato Schultz, DO

## 2018-05-20 NOTE — Assessment & Plan Note (Signed)
F/u [facility]dr  con't meds  Pt doing well

## 2018-05-20 NOTE — Assessment & Plan Note (Signed)
Tolerating statin, encouraged heart healthy diet, avoid trans fats, minimize simple carbs and saturated fats. Increase exercise as tolerated--- pt sees cardiology

## 2018-05-20 NOTE — Patient Instructions (Signed)

## 2018-05-20 NOTE — Assessment & Plan Note (Signed)
F/u ha specialist Acupuncture, chiropractic Massage rto prn

## 2018-05-27 DIAGNOSIS — M542 Cervicalgia: Secondary | ICD-10-CM | POA: Diagnosis not present

## 2018-05-27 DIAGNOSIS — R51 Headache: Secondary | ICD-10-CM | POA: Diagnosis not present

## 2018-05-29 DIAGNOSIS — M542 Cervicalgia: Secondary | ICD-10-CM | POA: Diagnosis not present

## 2018-05-29 DIAGNOSIS — M5124 Other intervertebral disc displacement, thoracic region: Secondary | ICD-10-CM | POA: Diagnosis not present

## 2018-05-29 DIAGNOSIS — M47812 Spondylosis without myelopathy or radiculopathy, cervical region: Secondary | ICD-10-CM | POA: Diagnosis not present

## 2018-05-29 DIAGNOSIS — M5032 Other cervical disc degeneration, mid-cervical region, unspecified level: Secondary | ICD-10-CM | POA: Diagnosis not present

## 2018-05-29 DIAGNOSIS — R51 Headache: Secondary | ICD-10-CM | POA: Diagnosis not present

## 2018-05-29 DIAGNOSIS — M47814 Spondylosis without myelopathy or radiculopathy, thoracic region: Secondary | ICD-10-CM | POA: Diagnosis not present

## 2018-05-30 MED FILL — METOPROLOL SUCCINATE ER 25: 25 | 90 days supply | Qty: 90 | Fill #1

## 2018-05-30 MED FILL — ONDANSETRON HCL 8 MG TABLET: 8 | 6 days supply | Qty: 20 | Fill #3

## 2018-06-04 DIAGNOSIS — M47812 Spondylosis without myelopathy or radiculopathy, cervical region: Secondary | ICD-10-CM | POA: Diagnosis not present

## 2018-06-04 DIAGNOSIS — M47814 Spondylosis without myelopathy or radiculopathy, thoracic region: Secondary | ICD-10-CM | POA: Diagnosis not present

## 2018-06-04 DIAGNOSIS — R51 Headache: Secondary | ICD-10-CM | POA: Diagnosis not present

## 2018-06-04 DIAGNOSIS — M542 Cervicalgia: Secondary | ICD-10-CM | POA: Diagnosis not present

## 2018-06-04 DIAGNOSIS — M5032 Other cervical disc degeneration, mid-cervical region, unspecified level: Secondary | ICD-10-CM | POA: Diagnosis not present

## 2018-06-04 DIAGNOSIS — M5124 Other intervertebral disc displacement, thoracic region: Secondary | ICD-10-CM | POA: Diagnosis not present

## 2018-06-07 ENCOUNTER — Ambulatory Visit: Payer: Self-pay | Admitting: Family Medicine

## 2018-06-07 ENCOUNTER — Encounter: Payer: Self-pay | Admitting: Family Medicine

## 2018-06-07 ENCOUNTER — Other Ambulatory Visit: Payer: Self-pay | Admitting: Physician Assistant

## 2018-06-07 VITALS — BP 100/72 | HR 59 | Temp 97.7°F | Ht 64.0 in | Wt 158.0 lb

## 2018-06-07 DIAGNOSIS — M62838 Other muscle spasm: Secondary | ICD-10-CM

## 2018-06-07 DIAGNOSIS — Z0000 Encounter for general adult medical examination without abnormal findings: Secondary | ICD-10-CM

## 2018-06-07 DIAGNOSIS — G43009 Migraine without aura, not intractable, without status migrainosus: Secondary | ICD-10-CM

## 2018-06-07 MED FILL — traZODone HCL 50 MG TABS: 50 | 90 days supply | Qty: 90 | Fill #1

## 2018-06-07 MED FILL — BACLOFEN 10 MG TABLET: 10 | 20 days supply | Qty: 60 | Fill #0

## 2018-06-07 NOTE — Patient Instructions (Signed)

## 2018-06-07 NOTE — Progress Notes (Signed)
[name] is a 57 y.o. female who presents today with concerns of physical exam. She reports that she is under the care of a PCP who she has a great relationship with. She reports that she is otherwise healthy. She denies any need for acute condition evaluation today.  Review of Systems  Constitutional: Negative for chills, fever and malaise/fatigue.  HENT: Negative for congestion, ear discharge, ear pain, sinus pain and sore throat.   Eyes: Negative.   Respiratory: Negative for cough, sputum production and shortness of breath.   Cardiovascular: Negative.  Negative for chest pain.  Gastrointestinal: Negative for abdominal pain, diarrhea, nausea and vomiting.  Genitourinary: Negative for dysuria, frequency, hematuria and urgency.  Musculoskeletal: Negative for myalgias.  Skin: Negative.   Neurological: Negative for headaches.  Endo/Heme/Allergies: Negative.   Psychiatric/Behavioral: Negative.     O: Vitals:   06/07/18 1056  BP: 100/72  Pulse: (!) 59  Temp: 97.7 F (36.5 C)  SpO2: 99%     Physical Exam  Constitutional: She is oriented to person, place, and time. Vital signs are normal. She appears well-developed and well-nourished. She is active.  Non-toxic appearance. She does not have a sickly appearance.  HENT:  Head: Normocephalic.  Right Ear: Hearing, tympanic membrane, external ear and ear canal normal.  Left Ear: Hearing, tympanic membrane, external ear and ear canal normal.  Nose: Nose normal.  Mouth/Throat: Uvula is midline and oropharynx is clear and moist.  Neck: Normal range of motion. Neck supple.  Cardiovascular: Normal rate, regular rhythm, normal heart sounds and normal pulses.  Pulmonary/Chest: Effort normal and breath sounds normal.  Abdominal: Soft. Bowel sounds are normal.  Musculoskeletal: Normal range of motion.  Lymphadenopathy:       Head (right side): No submental and no submandibular adenopathy present.       Head (left side): No submental and  no submandibular adenopathy present.    She has no cervical adenopathy.  Neurological: She is alert and oriented to person, place, and time.  Psychiatric: She has a normal mood and affect. Her speech is normal and behavior is normal. Cognition and memory are normal.  PHQ-9- positive Denies desire to harm self or others. Having a tough time with personal issues PCP is aware and treatment is initiated.  Vitals reviewed.  A: 1. Physical exam    P: Exam findings, diagnosis etiology and medication use and indications reviewed with patient. Follow- Up and discharge instructions provided. No emergent/urgent issues found on exam.  Patient verbalized understanding of information provided and agrees with plan of care (POC), all questions answered.  1. Physical exam WNL

## 2018-06-19 MED FILL — TOPIRAMATE 200 MG TABLET: 200 | 30 days supply | Qty: 30 | Fill #3

## 2018-06-19 MED FILL — HYDROXYZINE PAM 50 MG CAP: 50 | 10 days supply | Qty: 60 | Fill #0

## 2018-06-19 MED FILL — NARATRIPTAN HCL 2.5 MG TAB: 2.5 | 30 days supply | Qty: 8 | Fill #3

## 2018-06-24 DIAGNOSIS — K130 Diseases of lips: Secondary | ICD-10-CM | POA: Diagnosis not present

## 2018-06-24 DIAGNOSIS — L710 Perioral dermatitis: Secondary | ICD-10-CM | POA: Diagnosis not present

## 2018-06-24 DIAGNOSIS — L309 Dermatitis, unspecified: Secondary | ICD-10-CM | POA: Diagnosis not present

## 2018-06-24 MED FILL — DESONIDE 0.05% OINTMENT: 0.05 | 14 days supply | Qty: 15 | Fill #0

## 2018-06-24 MED FILL — DOXYCYCLINE HYC 20 MG TAB: 20 | 30 days supply | Qty: 60 | Fill #0

## 2018-06-24 MED FILL — FLUCONAZOLE 200 MG TAB: 200 | 1 days supply | Qty: 1 | Fill #0

## 2018-06-25 DIAGNOSIS — F4323 Adjustment disorder with mixed anxiety and depressed mood: Secondary | ICD-10-CM | POA: Diagnosis not present

## 2018-06-25 DIAGNOSIS — M47814 Spondylosis without myelopathy or radiculopathy, thoracic region: Secondary | ICD-10-CM | POA: Diagnosis not present

## 2018-06-25 DIAGNOSIS — M47812 Spondylosis without myelopathy or radiculopathy, cervical region: Secondary | ICD-10-CM | POA: Diagnosis not present

## 2018-06-25 DIAGNOSIS — R51 Headache: Secondary | ICD-10-CM | POA: Diagnosis not present

## 2018-06-25 DIAGNOSIS — M542 Cervicalgia: Secondary | ICD-10-CM | POA: Diagnosis not present

## 2018-06-25 DIAGNOSIS — M5032 Other cervical disc degeneration, mid-cervical region, unspecified level: Secondary | ICD-10-CM | POA: Diagnosis not present

## 2018-06-25 DIAGNOSIS — M5124 Other intervertebral disc displacement, thoracic region: Secondary | ICD-10-CM | POA: Diagnosis not present

## 2018-07-02 ENCOUNTER — Telehealth: Payer: Self-pay | Admitting: Physician Assistant

## 2018-07-02 NOTE — Telephone Encounter (Signed)
Pt has appt this week 07/05/18 with Nada MaclachlanKaren Teague Clark for fu and injection. Pt states her pharmacy told her the botox will need prior authorization. Pt states she has never had to have that before. Pt wants to know who is working on this for her so she can stay on schedule for botox this Friday.

## 2018-07-03 NOTE — Telephone Encounter (Signed)
Called patient to inform her to prior authorization for botox is being work on

## 2018-07-04 MED FILL — RYVENT 6 MG TABS: 6 | 15 days supply | Qty: 60 | Fill #2

## 2018-07-05 ENCOUNTER — Encounter: Payer: Self-pay | Admitting: Physician Assistant

## 2018-07-05 ENCOUNTER — Ambulatory Visit (INDEPENDENT_AMBULATORY_CARE_PROVIDER_SITE_OTHER): Payer: 59 | Admitting: Physician Assistant

## 2018-07-05 VITALS — BP 94/61 | HR 62 | Ht 64.0 in | Wt 158.6 lb

## 2018-07-05 DIAGNOSIS — G43709 Chronic migraine without aura, not intractable, without status migrainosus: Secondary | ICD-10-CM | POA: Diagnosis not present

## 2018-07-05 DIAGNOSIS — IMO0002 Reserved for concepts with insufficient information to code with codable children: Secondary | ICD-10-CM

## 2018-07-05 DIAGNOSIS — M62838 Other muscle spasm: Secondary | ICD-10-CM | POA: Diagnosis not present

## 2018-07-05 DIAGNOSIS — G43009 Migraine without aura, not intractable, without status migrainosus: Secondary | ICD-10-CM

## 2018-07-05 MED ORDER — GALCANEZUMAB-GNLM 120 MG/ML ~~LOC~~ SOAJ: SUBCUTANEOUS | 11 refills | Status: DC

## 2018-07-05 MED ORDER — ONDANSETRON HCL 8 MG PO TABS: 8.0000 mg | ORAL_TABLET | Freq: Three times a day (TID) | ORAL | 3 refills | Status: DC | PRN

## 2018-07-05 MED ORDER — BACLOFEN 10 MG PO TABS: 10.0000 mg | ORAL_TABLET | Freq: Three times a day (TID) | ORAL | 3 refills | Status: DC | PRN

## 2018-07-05 MED FILL — BOTOX 100 UNITS VIAL: 100 | 28 days supply | Qty: 1 | Fill #0

## 2018-07-05 MED FILL — BACLOFEN 10 MG TABLET: 10 | 20 days supply | Qty: 60 | Fill #0

## 2018-07-05 MED FILL — ONDANSETRON HCL 8 MG TABLET: 8 | 7 days supply | Qty: 20 | Fill #0

## 2018-07-05 NOTE — Progress Notes (Signed)
``````````````````````````````````````````````````````````````History:  [name] is a 57 y.o. G1P1001 who presents to clinic today for headache.  Since last seen she has been to inpatient treatment programs in [place] for one month and [place] one month.  She has stopped drinking alcohol as well as all vicodin and xanax.  She is doing yoga, meditation, seeing a chiropractor and generally taking good care of herself as she adjusts to her marital separation and life changes.  Nonetheless, her headaches are worsening with moderate to severe headaches every single day.  She has not used Emgality as was discussed last visit.  Furthermore, her Botox wore off many months ago and due to a mix-up with the prior auth, she does not have the medication to be able to inject today.  Her headaches are now more often located in the right temple whereas before they changed location often.      Past Medical History:  Diagnosis Date  . Dizziness 03/05/2017  . Essential hypertension 03/05/2017  . Hyperlipidemia 03/05/2017  . Infertility, female   . Migraines     Social History   Socioeconomic History  . Marital status: Married    Spouse name: Not on file  . Number of children: Not on file  . Years of education: Not on file  . Highest education level: Not on file  Occupational History    Employer: [employer]    Comment: [employer]  Social Needs  . Financial resource strain: Not on file  . Food insecurity:    Worry: Not on file    Inability: Not on file  . Transportation needs:    Medical: Not on file    Non-medical: Not on file  Tobacco Use  . Smoking status: Former Smoker    Types: Cigarettes    Last attempt to quit: 02/09/1992    Years since quitting: 26.4  . Smokeless tobacco: Never Used  Substance and Sexual Activity  . Alcohol use: Yes    Comment: 1-2 weekly  . Drug use: No  . Sexual activity: Yes    Partners: Male  Lifestyle  . Physical activity:    Days per week:  Not on file    Minutes per session: Not on file  . Stress: Not on file  Relationships  . Social connections:    Talks on phone: Not on file    Gets together: Not on file    Attends religious service: Not on file    Active member of club or organization: Not on file    Attends meetings of clubs or organizations: Not on file    Relationship status: Not on file  . Intimate partner violence:    Fear of current or ex partner: Not on file    Emotionally abused: Not on file    Physically abused: Not on file    Forced sexual activity: Not on file  Other Topics Concern  . Not on file  Social History Narrative  . Not on file    Family History  Problem Relation Age of Onset  . Hypertension Mother   . Allergic rhinitis Mother   . Cancer Maternal Grandmother   . Stroke Unknown   . Eczema Daughter   . Alcohol abuse Father   . Stroke Father   . Heart disease Maternal Grandfather   . Alcohol abuse Paternal Grandfather     No Known Allergies  Current Outpatient Medications on File Prior to Visit  Medication Sig Dispense Refill  . amLODipine (NORVASC) 5  MG tablet Take 1 tablet (5 mg total) by mouth daily. 90 tablet 3  . aspirin EC 81 MG tablet Take 81 mg by mouth daily.    . baclofen (LIORESAL) 10 MG tablet TAKE 1 TABLET (10 MG TOTAL) BY MOUTH EVERY 8 (EIGHT) HOURS AS NEEDED. 60 tablet 1  . BOTOX 100 units SOLR injection INJECT 100 UNITS INTO THE MUSCLE ONCE EVERY 3 MONTHS 1 each 3  . desonide (DESOWEN) 0.05 % ointment   0  . doxycycline (PERIOSTAT) 20 MG tablet   1  . Fluticasone Propionate (XHANCE) 93 MCG/ACT EXHU Place 2 sprays into the nose 2 (two) times daily. 32 mL 5  . hydrOXYzine (VISTARIL) 100 MG capsule Take 1 capsule (100 mg total) by mouth 3 (three) times daily as needed for itching. 30 capsule 0  . ibuprofen (ADVIL,MOTRIN) 800 MG tablet Take 800 mg by mouth every 8 (eight) hours as needed.    . magnesium oxide (MAG-OX) 400 MG tablet Take 400 mg by mouth daily.    .  metoprolol succinate (TOPROL-XL) 25 MG 24 hr tablet Take 1 tablet (25 mg total) by mouth daily. 90 tablet 3  . naratriptan (AMERGE) 2.5 MG tablet TAKE 1 TABLET BY MOUTH AT ONSET OF HEADACHE. IF RETURNS OR DOES NOT RESOLVE MAY REPEAT AFTER 4 HOURS. DO NOT EXCEED 2 TABLETS IN 24 HOURS 10 tablet 11  . NONFORMULARY OR COMPOUNDED ITEM Gaba Calm as needed for anxiety    . ondansetron (ZOFRAN) 8 MG tablet Take 1 tablet (8 mg total) by mouth every 8 (eight) hours as needed. 20 tablet 3  . pantoprazole (PROTONIX) 40 MG tablet Take 1 tablet (40 mg total) by mouth daily. 90 tablet 3  . Pseudoephedrine-DM-GG (SUDAFED COUGH PO) Take by mouth.    . rosuvastatin (CRESTOR) 40 MG tablet TAKE 1 TABLET BY MOUTH DAILY. 30 tablet 11  . RYVENT 6 MG TABS Take 1 tablet by mouth See admin instructions. Every 6-8 hours as needed. 60 tablet 5  . sertraline (ZOLOFT) 50 MG tablet Take 1 tablet (50 mg total) by mouth daily. 30 tablet 3  . SUMAtriptan (IMITREX) 100 MG tablet Take 1 tablet (100 mg total) by mouth once as needed for migraine. 9 tablet 11  . tiZANidine (ZANAFLEX) 4 MG tablet Take 1 tablet (4 mg total) by mouth 3 (three) times daily. 90 tablet 2  . topiramate (TOPAMAX) 200 MG tablet Take 1 tablet (200 mg total) by mouth daily. 30 tablet 5  . traZODone (DESYREL) 50 MG tablet Take 0.5-1 tablets (25-50 mg total) by mouth at bedtime as needed. for sleep 90 tablet 1  . vitamin B-12 (CYANOCOBALAMIN) 500 MCG tablet Take 500 mcg by mouth daily.    . celecoxib (CELEBREX) 200 MG capsule TAKE 1 CAPSULE BY MOUTH 2 TIMES DAILY. (Patient not taking: Reported on 07/05/2018) 60 capsule 11   No current facility-administered medications on file prior to visit.      Review of Systems:  All pertinent positive/negative included in HPI, all other review of systems are negative   Objective:  Physical Exam BP 94/61   Pulse 62   Ht 5\' 4"  (1.626 m)   Wt 158 lb 9.6 oz (71.9 kg)   BMI 27.22 kg/m  CONSTITUTIONAL: Well-developed,  well-nourished female in no acute distress.  EYES: EOM intact ENT: Normocephalic CARDIOVASCULAR: Regular rate . RESPIRATORY: Normal rate.   MUSCULOSKELETAL: Normal ROM SKIN: Warm, dry without erythema  NEUROLOGICAL: Alert, oriented, CN II-XII grossly intact, Appropriate balance PSYCH:  Normal behavior, mood   Assessment & Plan:  Assessment: 1. Chronic migraine   2. Migraine without aura and without status migrainosus, not intractable   3. Muscle spasm      Plan: Begin Emgality with 2 injections today and then 1 monthly.  Samples provided.  Directions reviewed.   Pt may have Botox as soon as it is available.  She will call to schedule Pt applauded for all the healthy things she is doing to improve her life and encouraged to keep up the excellent work.  Other meds approved as pt needed.    Follow-up in 3-6 months or sooner PRN  Bertram Denver, PA-C 07/05/2018 10:44 AM

## 2018-07-05 NOTE — Patient Instructions (Signed)

## 2018-07-08 ENCOUNTER — Telehealth: Payer: Self-pay

## 2018-07-08 NOTE — Telephone Encounter (Signed)
Call patient to make her make her aware the botox was approved.

## 2018-07-09 MED FILL — CELECOXIB 200 MG CAP: 200 | 30 days supply | Qty: 60 | Fill #0

## 2018-07-11 ENCOUNTER — Other Ambulatory Visit: Payer: Self-pay | Admitting: Family Medicine

## 2018-07-11 DIAGNOSIS — F419 Anxiety disorder, unspecified: Secondary | ICD-10-CM

## 2018-07-12 MED ORDER — HYDROXYZINE PAMOATE 100 MG PO CAPS: 100.0000 mg | ORAL_CAPSULE | Freq: Three times a day (TID) | ORAL | 0 refills | Status: DC | PRN

## 2018-07-12 MED FILL — EMGALITY 120 MG/ML SOAJ: 120 | 30 days supply | Qty: 2 | Fill #0

## 2018-07-12 NOTE — Telephone Encounter (Signed)
Please advise hydroxyzine request?

## 2018-07-15 ENCOUNTER — Other Ambulatory Visit: Payer: Self-pay

## 2018-07-15 DIAGNOSIS — F419 Anxiety disorder, unspecified: Secondary | ICD-10-CM

## 2018-07-15 MED ORDER — HYDROXYZINE HCL 50 MG PO TABS: ORAL_TABLET | ORAL | 0 refills | Status: DC

## 2018-07-15 MED FILL — hydrOXYzine HCL 50 MG TABS: 50 | 10 days supply | Qty: 60 | Fill #0

## 2018-07-15 NOTE — Telephone Encounter (Signed)
Copied from CRM [phone]. Topic: General - Other >> Jul 12, 2018  3:37 PM Gerrianne ScalePayne, Angela L wrote: Reason for CRM:   [facility] calling wanting to change the hydrOXYzine (VISTARIL) 100 MG capsule  to tablets stating that the capsules in on back order

## 2018-07-15 NOTE — Telephone Encounter (Signed)
Received message from [facility] asking to change hydroxyzine 100 mg capsule (on back order)  to hydroxyzine 50 mg tablets.   Originally ordered as:  hydrOXYzine (VISTARIL) 100 MG capsule  Take 1 capsule by mouth 3 times daily as needed for itching.  Change to: hydroxyzine (ATARAX/VISTARIL) 50 mg tablet Take 2 tablets (100 mg total) by mouth 3 times daily as needed for itching.   Just wanted to make sure this change is ok. Please advise.

## 2018-07-18 ENCOUNTER — Other Ambulatory Visit: Payer: Self-pay | Admitting: Cardiovascular Disease

## 2018-07-18 MED FILL — tiZANidine HCL 4 MG TABS: 4 | 30 days supply | Qty: 90 | Fill #2

## 2018-07-18 MED FILL — AMLODIPINE BESYLATE 5 MG TA: 5 | 90 days supply | Qty: 90 | Fill #3

## 2018-07-18 MED FILL — PANTOPRAZOLE SOD DR 40 MG T: 40 | 90 days supply | Qty: 90 | Fill #2

## 2018-07-18 MED FILL — TOPIRAMATE 200 MG TABLET: 200 | 30 days supply | Qty: 30 | Fill #4

## 2018-07-19 ENCOUNTER — Ambulatory Visit (INDEPENDENT_AMBULATORY_CARE_PROVIDER_SITE_OTHER): Payer: 59 | Admitting: Physician Assistant

## 2018-07-19 ENCOUNTER — Encounter: Payer: Self-pay | Admitting: Physician Assistant

## 2018-07-19 VITALS — BP 103/66 | HR 72 | Wt 159.8 lb

## 2018-07-19 DIAGNOSIS — M62838 Other muscle spasm: Secondary | ICD-10-CM | POA: Diagnosis not present

## 2018-07-19 DIAGNOSIS — G43709 Chronic migraine without aura, not intractable, without status migrainosus: Secondary | ICD-10-CM | POA: Diagnosis not present

## 2018-07-19 DIAGNOSIS — IMO0002 Reserved for concepts with insufficient information to code with codable children: Secondary | ICD-10-CM

## 2018-07-19 DIAGNOSIS — G43001 Migraine without aura, not intractable, with status migrainosus: Secondary | ICD-10-CM

## 2018-07-19 MED ORDER — TRAMADOL HCL 50 MG PO TABS: 50.0000 mg | ORAL_TABLET | Freq: Four times a day (QID) | ORAL | 0 refills | Status: DC | PRN

## 2018-07-19 MED ORDER — PREDNISONE 20 MG PO TABS: 20.0000 mg | ORAL_TABLET | Freq: Every day | ORAL | 0 refills | Status: DC

## 2018-07-19 MED ORDER — ONABOTULINUMTOXINA 100 UNITS IJ SOLR
100.0000 [IU] | Freq: Once | INTRAMUSCULAR | Status: AC
  Administered 2018-07-19: 100 [IU] via INTRAMUSCULAR

## 2018-07-19 MED FILL — predniSONE 20 MG TABS: 20 | 10 days supply | Qty: 10 | Fill #0

## 2018-07-19 MED FILL — traMADol HCL 50 MG TABS: 50 | 5 days supply | Qty: 20 | Fill #0

## 2018-07-19 MED FILL — ROSUVASTATIN CALCIUM 40 MG: 40 | 30 days supply | Qty: 30 | Fill #0

## 2018-07-19 NOTE — Addendum Note (Signed)
Addended by: Cheree DittoGRAHAM, Winferd Wease A on: 07/19/2018 10:23 AM   Modules accepted: Orders

## 2018-07-19 NOTE — Patient Instructions (Signed)
Botulinum Toxin Cosmetic Injection, Care After Refer to this sheet in the next few weeks. These instructions provide you with information about caring for yourself after your procedure. Your health care provider may also give you more specific instructions. Your treatment has been planned according to current medical practices, but problems sometimes occur. Call your health care provider if you have any problems or questions after your procedure. What can I expect after the procedure? After the procedure, it is common to have:  Soreness, redness, or swelling around the injection sites.  Small bruises around the injection sites.  Brief numbness near the injection sites.  Headache.  Nausea.  Follow these instructions at home:   Do not lie down for 4 hours after treatment or as told by your health care provider.  Do not rub the injected area. This can spread the botulinum toxin to surrounding muscles.  Depending on the injection site: ? Your health care provider may ask you to avoid moving the muscles in the injected area. Follow instructions from your health care provider. ? Your health care provider may tell you to frown or squint regularly. You may need to do these facial exercises every 15 minutes for 1 hour after treatment, or as told by your health care provider. Follow instructions from your health care provider.  Do not do any strenuous activity for 2 hours after the procedure or for as long as told by your health care provider. This includes lifting heavy items, working out, or doing activities that increase your heart rate.  Do not get laser treatments, facials, or facial massages for 1-2 weeks after the procedure or for as long as told by your health care provider.  If directed, apply ice to the injected area: ? Put ice in a plastic bag. ? Place a towel between your skin and the bag. ? Leave the ice on for 20 minutes, 2-3 times per day.  Take over-the-counter and  prescription medicines only as told by your doctor.  Keep all follow-up visits as told by your doctor. This is important. Contact a health care provider if:  You develop neck pain.  You have a headache or nausea that gets worse.  You feel unusually drowsy.  You have pain, tightness, weakness, or spasms in your face or neck.  Your mouth is dry.  You have trouble having a bowel movement (constipation).  You develop anxiety. Get help right away if:  You have chest pain.  You faint.  You have a fever.  You develop signs of an allergic reaction. These include: ? An itchy rash or welts. ? Wheezing or shortness of breath. ? Dizziness.  You develop double or blurred vision.  Your pupils start to widen (dilate) and are sensitive to light.  Your eyelids become droopy or swollen.  You have trouble speaking.  You feel short of breath or have trouble breathing.  Your voice changes or becomes hoarse.  You start to have trouble swallowing.  You develop muscle pain, weakness, or spasms in other parts of your body. This information is not intended to replace advice given to you by your health care provider. Make sure you discuss any questions you have with your health care provider. Document Released: 05/01/2011 Document Revised: 05/04/2016 Document Reviewed: 06/02/2015 Elsevier Interactive Patient Education  2018 [employer]vier Inc.  

## 2018-07-19 NOTE — Progress Notes (Signed)
S: Pt in office today for Botox injections.  She has not had the botox in some time and is suffering with migraines.  THe Emgality was restarted one week ago and is yet to give benefit.  She has a headache all the time and is miserable.     O: BP 103/66   Pulse 72   Wt 159 lb 12.8 oz (72.5 kg)   BMI 27.43 kg/m    Botox Procedure Note Vial of Botox was :Supplied by Patient Lot # [id number] Expiration Date 01/2021   Botox Dosing by Muscle Group for Chronic Migraine   Injection Sites for Migraines  Botox 100 units was injected using the dosage in the table above in the pattern shown above.  A: Migraine Status Chronic Migraine Muscle spasm   P: Botox 100 units injected today.  I have provided an rx for prednisone taper to help with current daily, severe migraine ongoing for days.  20mg  tabs, take 4 the first day, then 3 the 2nd day, 2 tabs the 3rd day and 1 tab the 4th and final day.   Ultram - limited supply for rescue RTC 3 Months.

## 2018-07-19 NOTE — Telephone Encounter (Signed)
Rx(s) sent to pharmacy electronically.  

## 2018-08-06 DIAGNOSIS — M9903 Segmental and somatic dysfunction of lumbar region: Secondary | ICD-10-CM | POA: Diagnosis not present

## 2018-08-06 DIAGNOSIS — M542 Cervicalgia: Secondary | ICD-10-CM | POA: Diagnosis not present

## 2018-08-06 DIAGNOSIS — R51 Headache: Secondary | ICD-10-CM | POA: Diagnosis not present

## 2018-08-06 DIAGNOSIS — M47812 Spondylosis without myelopathy or radiculopathy, cervical region: Secondary | ICD-10-CM | POA: Diagnosis not present

## 2018-08-06 DIAGNOSIS — M47814 Spondylosis without myelopathy or radiculopathy, thoracic region: Secondary | ICD-10-CM | POA: Diagnosis not present

## 2018-08-06 DIAGNOSIS — M5124 Other intervertebral disc displacement, thoracic region: Secondary | ICD-10-CM | POA: Diagnosis not present

## 2018-08-06 DIAGNOSIS — M5032 Other cervical disc degeneration, mid-cervical region, unspecified level: Secondary | ICD-10-CM | POA: Diagnosis not present

## 2018-08-09 ENCOUNTER — Encounter: Payer: 59 | Admitting: Physician Assistant

## 2018-08-09 ENCOUNTER — Telehealth: Payer: Self-pay | Admitting: Radiology

## 2018-08-09 NOTE — Telephone Encounter (Signed)
Called and left voicemail for patient to call [id number] to schedule f/u appointment with headache specialist .

## 2018-08-13 DIAGNOSIS — N3020 Other chronic cystitis without hematuria: Secondary | ICD-10-CM | POA: Diagnosis not present

## 2018-08-13 DIAGNOSIS — N952 Postmenopausal atrophic vaginitis: Secondary | ICD-10-CM | POA: Diagnosis not present

## 2018-08-14 MED FILL — CELECOXIB 200 MG CAPSULE: 200 | 30 days supply | Qty: 60 | Fill #1

## 2018-08-14 MED FILL — DOXYCYCLINE HYC 20 MG TAB: 20 | 30 days supply | Qty: 60 | Fill #1

## 2018-08-22 ENCOUNTER — Other Ambulatory Visit: Payer: Self-pay | Admitting: Family Medicine

## 2018-08-22 ENCOUNTER — Encounter: Payer: Self-pay | Admitting: Family Medicine

## 2018-08-22 MED FILL — SUMAtriptan SUCCINATE 100 M: 100 | 30 days supply | Qty: 8 | Fill #2

## 2018-08-22 MED FILL — NARATRIPTAN HCL 2.5 MG TAB: 2.5 | 30 days supply | Qty: 8 | Fill #4

## 2018-08-23 MED FILL — hydrOXYzine HCL 50 MG TABS: 50 | 10 days supply | Qty: 60 | Fill #0

## 2018-08-29 ENCOUNTER — Other Ambulatory Visit: Payer: Self-pay | Admitting: Family Medicine

## 2018-08-29 DIAGNOSIS — G4700 Insomnia, unspecified: Secondary | ICD-10-CM

## 2018-08-29 MED FILL — traZODone HCL 50 MG TABS: 50 | 90 days supply | Qty: 90 | Fill #0

## 2018-08-29 MED FILL — TOPIRAMATE 200 MG TABLET: 200 | 30 days supply | Qty: 30 | Fill #5

## 2018-08-29 MED FILL — BACLOFEN 10 MG TABLET: 10 | 20 days supply | Qty: 60 | Fill #1

## 2018-09-26 ENCOUNTER — Other Ambulatory Visit: Payer: Self-pay

## 2018-09-26 MED ORDER — BOTOX 100 UNITS IJ SOLR: 100.0000 [IU] | Freq: Once | INTRAMUSCULAR | 3 refills | Status: AC

## 2018-09-26 MED FILL — BOTOX 100 UNITS VIAL: 100 | 84 days supply | Qty: 1 | Fill #0

## 2018-09-26 NOTE — Telephone Encounter (Signed)
Refill on botox

## 2018-09-27 ENCOUNTER — Ambulatory Visit (INDEPENDENT_AMBULATORY_CARE_PROVIDER_SITE_OTHER): Payer: 59 | Admitting: Cardiovascular Disease

## 2018-09-27 ENCOUNTER — Encounter: Payer: Self-pay | Admitting: Cardiovascular Disease

## 2018-09-27 VITALS — BP 118/82 | HR 58 | Ht 64.0 in | Wt 168.0 lb

## 2018-09-27 DIAGNOSIS — Z13220 Encounter for screening for lipoid disorders: Secondary | ICD-10-CM

## 2018-09-27 DIAGNOSIS — Z5181 Encounter for therapeutic drug level monitoring: Secondary | ICD-10-CM

## 2018-09-27 DIAGNOSIS — R635 Abnormal weight gain: Secondary | ICD-10-CM

## 2018-09-27 DIAGNOSIS — I10 Essential (primary) hypertension: Secondary | ICD-10-CM

## 2018-09-27 MED ORDER — ROSUVASTATIN CALCIUM 40 MG PO TABS: 40.0000 mg | ORAL_TABLET | Freq: Every day | ORAL | 3 refills | Status: DC

## 2018-09-27 MED FILL — ROSUVASTATIN CALCIUM 40 MG: 40 | 90 days supply | Qty: 90 | Fill #0

## 2018-09-27 NOTE — Progress Notes (Signed)
Cardiology Office Note   Date:  09/27/2018   ID:  [name], DOB [birth year], MRN [id number]  PCP:  [name], Alexandria Congress, DO  Cardiologist:   Chilton Si, MD   Chief Complaint  Patient presents with  . Follow-up    16 months      History of Present Illness: [name] is a 57 y.o. female with hypertension who presents for follow up.  She was first seen 01/2017 for an evaluation of chest pain and an abnormal EKG.  She was referred for an Lexiscan Myoview that revealed LVEF 53% and no ischemia. She also reported dizziness and her heart rate was noted to be 45 bpm.  Metoprolol was switched to amlodipine.  She reported high blood pressures and it was recommended that she reduce alcohol and Sudafed use.  Based on her lipid profile she was started on rosuvastatin 40 mg daily.  At her last appointment her evening blood pressures were elevated so amlodipine was split into 2.5 mg twice daily.  Since making that change of blood pressure has been very well-controlled and she only had one elevated reading.   The last year has been very stressful for [name].  Her sister died of an overdose.  Her husband left her one year ago.  She struggled with heavy alcohol use and anxiety.  She was treated for two months and is doing much better now.  During that time she lost 25 pounds due to not eating.  However now, her diet has been very poor and she is eating much more.  She has gained half of that weight back.  She has not experienced any chest pain or shortness of breath.  She has no lower extremity edema, orthopnea, or PND.  She wears compressions on long trips and that seems to help the edema she previously experienced.  She struggles with making good eating habits.  She likes to cycle and walk but has not been exercising regularly.  She lacks the motivation to start back.   Past Medical History:  Diagnosis Date  . Dizziness 03/05/2017  . Essential hypertension 03/05/2017  .  Hyperlipidemia 03/05/2017  . Infertility, female   . Migraines     Past Surgical History:  Procedure Laterality Date  . ANKLE SURGERY     Left  . SHOULDER SURGERY     Left     Current Outpatient Medications  Medication Sig Dispense Refill  . amLODipine (NORVASC) 5 MG tablet Take 1 tablet (5 mg total) by mouth daily. 90 tablet 3  . aspirin EC 81 MG tablet Take 81 mg by mouth daily.    . baclofen (LIORESAL) 10 MG tablet Take 1 tablet (10 mg total) by mouth every 8 (eight) hours as needed. 60 tablet 3  . celecoxib (CELEBREX) 200 MG capsule TAKE 1 CAPSULE BY MOUTH 2 TIMES DAILY. 60 capsule 11  . desonide (DESOWEN) 0.05 % ointment   0  . doxycycline (PERIOSTAT) 20 MG tablet   1  . Fluticasone Propionate (XHANCE) 93 MCG/ACT EXHU Place 2 sprays into the nose 2 (two) times daily. 32 mL 5  . Galcanezumab-gnlm (EMGALITY) 120 MG/ML SOAJ Inject 240 mg into the skin as directed AND 120 mg every 30 (thirty) days. Inj 240mg  once then 120mg  monthly. 1 pen 11  . hydrOXYzine (ATARAX/VISTARIL) 50 MG tablet TAKE 2 TABLETS BY MOUTH 3 TIMES DAILY AS NEEDED FOR ITCHING. 60 tablet 0  . hydrOXYzine (VISTARIL) 100 MG capsule Take 1  capsule (100 mg total) by mouth 3 (three) times daily as needed for itching. 30 capsule 0  . ibuprofen (ADVIL,MOTRIN) 800 MG tablet Take 800 mg by mouth every 8 (eight) hours as needed.    . magnesium oxide (MAG-OX) 400 MG tablet Take 400 mg by mouth daily.    . metoprolol succinate (TOPROL-XL) 25 MG 24 hr tablet Take 1 tablet (25 mg total) by mouth daily. 90 tablet 3  . naratriptan (AMERGE) 2.5 MG tablet TAKE 1 TABLET BY MOUTH AT ONSET OF HEADACHE. IF RETURNS OR DOES NOT RESOLVE MAY REPEAT AFTER 4 HOURS. DO NOT EXCEED 2 TABLETS IN 24 HOURS 10 tablet 11  . NONFORMULARY OR COMPOUNDED ITEM Gaba Calm as needed for anxiety    . ondansetron (ZOFRAN) 8 MG tablet Take 1 tablet (8 mg total) by mouth every 8 (eight) hours as needed. 20 tablet 3  . pantoprazole (PROTONIX) 40 MG tablet Take 1  tablet (40 mg total) by mouth daily. 90 tablet 3  . predniSONE (DELTASONE) 20 MG tablet Take 1 tablet (20 mg total) by mouth daily with breakfast. 10 tablet 0  . Pseudoephedrine-DM-GG (SUDAFED COUGH PO) Take by mouth.    . rosuvastatin (CRESTOR) 40 MG tablet Take 1 tablet (40 mg total) by mouth daily. <PLEASE MAKE APPOINTMENT FOR REFILLS> 30 tablet 0  . RYVENT 6 MG TABS Take 1 tablet by mouth See admin instructions. Every 6-8 hours as needed. 60 tablet 5  . sertraline (ZOLOFT) 50 MG tablet Take 1 tablet (50 mg total) by mouth daily. 30 tablet 3  . SUMAtriptan (IMITREX) 100 MG tablet Take 1 tablet (100 mg total) by mouth once as needed for migraine. 9 tablet 11  . tiZANidine (ZANAFLEX) 4 MG tablet Take 1 tablet (4 mg total) by mouth 3 (three) times daily. 90 tablet 2  . topiramate (TOPAMAX) 200 MG tablet Take 1 tablet (200 mg total) by mouth daily. 30 tablet 5  . traMADol (ULTRAM) 50 MG tablet Take 1 tablet (50 mg total) by mouth every 6 (six) hours as needed for severe pain. 20 tablet 0  . traZODone (DESYREL) 50 MG tablet TAKE 1/2-1 TABLET (25-50 MG) BY MOUTH AT BEDTIME AS NEEDED FOR SLEEP 90 tablet 1  . vitamin B-12 (CYANOCOBALAMIN) 500 MCG tablet Take 500 mcg by mouth daily.     No current facility-administered medications for this visit.     Allergies:   Patient has no known allergies.    Social History:  The patient  reports that she quit smoking about 26 years ago. Her smoking use included cigarettes. She has never used smokeless tobacco. She reports that she drinks alcohol. She reports that she does not use drugs.   Family History:  The patient's family history includes Alcohol abuse in her father and paternal grandfather; Allergic rhinitis in her mother; Cancer in her maternal grandmother; Eczema in her daughter; Heart disease in her maternal grandfather; Hypertension in her mother; Stroke in her father and unknown relative.    ROS:  Please see the history of present illness.    Otherwise, review of systems are positive for none.   All other systems are reviewed and negative.    PHYSICAL EXAM: VS:  BP 118/82 (BP Location: Left Arm, Patient Position: Sitting, Cuff Size: Normal)   Pulse (!) 58   Ht 5\' 4"  (1.626 m)   Wt 168 lb (76.2 kg)   BMI 28.84 kg/m  , BMI Body mass index is 28.84 kg/m. GENERAL:  Well appearing HEENT:  Pupils equal round and reactive, fundi not visualized, oral mucosa unremarkable NECK:  No jugular venous distention, waveform within normal limits, carotid upstroke brisk and symmetric, no bruits, no thyromegaly LYMPHATICS:  No cervical adenopathy LUNGS:  Clear to auscultation bilaterally HEART:  RRR.  PMI not displaced or sustained,S1 and S2 within normal limits, no S3, no S4, no clicks, no rubs, no murmurs ABD:  Flat, positive bowel sounds normal in frequency in pitch, no bruits, no rebound, no guarding, no midline pulsatile mass, no hepatomegaly, no splenomegaly EXT:  2 plus pulses throughout, no edema, no cyanosis no clubbing SKIN:  No rashes no nodules NEURO:  Cranial nerves II through XII grossly intact, motor grossly intact throughout PSYCH:  Cognitively intact, oriented to person place and time   EKG:  EKG is ordered today. 01/15/17: sinus bradycardia. Rate 45 bpm. Anterior T-wave inversions. 09/27/18: Sinus bradycardia.  Rate 58 bpm.  Exercise Myoview 02/20/17: Nuclear stress EF: 53%.  The left ventricular ejection fraction is mildly decreased (45-54%) / lower limit of normal  There was no ST segment deviation noted during stress.  The study is normal.  This is a low risk study.   Recent Labs: No results found for requested labs within last 8760 hours.    Lipid Panel    Component Value Date/Time   CHOL 162 05/30/2017 1142   TRIG 102 05/30/2017 1142   HDL 76 05/30/2017 1142   CHOLHDL 2.1 05/30/2017 1142   LDLCALC 66 05/30/2017 1142      Wt Readings from Last 3 Encounters:  09/27/18 168 lb (76.2 kg)  07/19/18 159 lb  12.8 oz (72.5 kg)  07/05/18 158 lb 9.6 oz (71.9 kg)      ASSESSMENT AND PLAN:  # Chest pain: # Shortness of breath: Resolved.  Exercise Myoview was negative for ischemia 02/2017. Her shortness of breath has also improved.   # Hypertension: BP is well-controlled.  Continue amlodipine and metoprolol.  # Hyperlipidemia: Check lipids and CMP.  Continue rosuvastatin.    # CV Disease Prevention: Ms. Lanphier would like to meet with our Care Guide regarding diet and exercise changes.  Recommend the Mediterranean diet.  Current medicines are reviewed at length with the patient today.  The patient does not have concerns regarding medicines.  The following changes have been made:  None   Labs/ tests ordered today include:   No orders of the defined types were placed in this encounter.    Disposition:   FU with Osiel Stick C. Duke Salvia, MD, [facility] in 1 year.      Signed, Arville Postlewaite C. Duke Salvia, MD, [facility]  09/27/2018 3:03 PM    [facility]

## 2018-09-27 NOTE — Patient Instructions (Addendum)
Medication Instructions:  Your physician recommends that you continue on your current medications as directed. Please refer to the Current Medication list given to you today.  If you need a refill on your cardiac medications before your next appointment, please call your pharmacy.   Lab work: FASTING LIPID/CMET/TSH/CBC SOON  If you have labs (blood work) drawn today and your tests are completely normal, you will receive your results only by: Marland Kitchen MyChart Message (if you have MyChart) OR . A paper copy in the mail If you have any lab test that is abnormal or we need to change your treatment, we will call you to review the results.  Testing/Procedures: NONE  Follow-Up: At [facility], you and your health needs are our priority.  As part of our continuing mission to provide you with exceptional heart care, we have created designated Provider Care Teams.  These Care Teams include your primary Cardiologist (physician) and Advanced Practice Providers (APPs -  Physician Assistants and Nurse Practitioners) who all work together to provide you with the care you need, when you need it. You will need a follow up appointment in 12 months.  Please call our office 2 months in advance to schedule this appointment.  You may see DR [facility]  or one of the following Advanced Practice Providers on your designated Care Team:   Corine Shelter, PA-C Judy Pimple, [place] . Marjie Skiff, PA-C  You have been referred to [facility], SHE WILL CALL AND ARRANGE. IF YOU DO NOT HEAR FROM HER BY THE END OF NEXT WEEK CALL THE OFFICE AT [phone]

## 2018-09-29 ENCOUNTER — Encounter: Payer: Self-pay | Admitting: Cardiovascular Disease

## 2018-10-01 ENCOUNTER — Other Ambulatory Visit: Payer: Self-pay | Admitting: Allergy and Immunology

## 2018-10-01 ENCOUNTER — Other Ambulatory Visit: Payer: Self-pay | Admitting: Family Medicine

## 2018-10-01 DIAGNOSIS — J310 Chronic rhinitis: Secondary | ICD-10-CM

## 2018-10-01 MED FILL — CELECOXIB 200 MG CAP: 200 | 30 days supply | Qty: 60 | Fill #2

## 2018-10-01 MED FILL — ONDANSETRON HCL 8 MG TABLET: 8 | 7 days supply | Qty: 20 | Fill #1

## 2018-10-01 MED FILL — NARATRIPTAN HCL 2.5 MG TAB: 2.5 | 30 days supply | Qty: 8 | Fill #5

## 2018-10-01 MED FILL — METOPROLOL SUCCINATE ER 25: 25 | 90 days supply | Qty: 90 | Fill #2

## 2018-10-01 MED FILL — SERTRALINE HCL 100 MG TAB: 100 | 30 days supply | Qty: 60 | Fill #3

## 2018-10-01 MED FILL — TOPIRAMATE 200 MG TABLET: 200 | 30 days supply | Qty: 30 | Fill #6

## 2018-10-01 MED FILL — traZODone HCL 50 MG TABS: 50 | 90 days supply | Qty: 90 | Fill #0

## 2018-10-08 ENCOUNTER — Telehealth: Payer: Self-pay

## 2018-10-08 NOTE — Telephone Encounter (Signed)
Unavailable. Left vm

## 2018-10-15 MED FILL — EMGALITY 120 MG/ML SOAJ: 120 | 30 days supply | Qty: 1 | Fill #1

## 2018-10-22 ENCOUNTER — Encounter: Payer: Self-pay | Admitting: Family Medicine

## 2018-10-22 ENCOUNTER — Other Ambulatory Visit: Payer: Self-pay | Admitting: Family Medicine

## 2018-10-22 DIAGNOSIS — F419 Anxiety disorder, unspecified: Secondary | ICD-10-CM

## 2018-10-22 MED ORDER — HYDROXYZINE PAMOATE 100 MG PO CAPS: 100.0000 mg | ORAL_CAPSULE | Freq: Three times a day (TID) | ORAL | 5 refills | Status: DC | PRN

## 2018-10-22 MED FILL — HYDROXYZINE PAM 50 MG CAP: 50 | 10 days supply | Qty: 60 | Fill #0

## 2018-10-24 ENCOUNTER — Other Ambulatory Visit: Payer: Self-pay | Admitting: Cardiovascular Disease

## 2018-10-24 MED FILL — BACLOFEN 10 MG TABLET: 10 | 20 days supply | Qty: 60 | Fill #2

## 2018-10-24 MED FILL — PANTOPRAZOLE SOD DR 40 MG T: 40 | 90 days supply | Qty: 90 | Fill #3

## 2018-10-24 MED FILL — AMLODIPINE BESYLATE 5 MG TA: 5 | 90 days supply | Qty: 90 | Fill #0

## 2018-10-29 ENCOUNTER — Other Ambulatory Visit: Payer: Self-pay | Admitting: Physician Assistant

## 2018-11-05 MED FILL — CELECOXIB 200 MG CAP: 200 | 30 days supply | Qty: 60 | Fill #3

## 2018-11-15 ENCOUNTER — Other Ambulatory Visit: Payer: Self-pay | Admitting: Physician Assistant

## 2018-11-15 ENCOUNTER — Encounter: Payer: Self-pay | Admitting: Physician Assistant

## 2018-11-15 ENCOUNTER — Ambulatory Visit (INDEPENDENT_AMBULATORY_CARE_PROVIDER_SITE_OTHER): Payer: 59 | Admitting: Physician Assistant

## 2018-11-15 VITALS — BP 132/84 | HR 72 | Wt 169.2 lb

## 2018-11-15 DIAGNOSIS — G43709 Chronic migraine without aura, not intractable, without status migrainosus: Secondary | ICD-10-CM

## 2018-11-15 DIAGNOSIS — G43809 Other migraine, not intractable, without status migrainosus: Secondary | ICD-10-CM | POA: Diagnosis not present

## 2018-11-15 DIAGNOSIS — G43011 Migraine without aura, intractable, with status migrainosus: Secondary | ICD-10-CM | POA: Diagnosis not present

## 2018-11-15 DIAGNOSIS — M542 Cervicalgia: Secondary | ICD-10-CM | POA: Diagnosis not present

## 2018-11-15 DIAGNOSIS — IMO0002 Reserved for concepts with insufficient information to code with codable children: Secondary | ICD-10-CM

## 2018-11-15 DIAGNOSIS — G4700 Insomnia, unspecified: Secondary | ICD-10-CM

## 2018-11-15 DIAGNOSIS — G43009 Migraine without aura, not intractable, without status migrainosus: Secondary | ICD-10-CM

## 2018-11-15 DIAGNOSIS — M62838 Other muscle spasm: Secondary | ICD-10-CM | POA: Diagnosis not present

## 2018-11-15 MED ORDER — TOPIRAMATE 200 MG PO TABS: 200.0000 mg | ORAL_TABLET | Freq: Every day | ORAL | 5 refills | Status: DC

## 2018-11-15 MED ORDER — SUMATRIPTAN SUCCINATE 100 MG PO TABS: 100.0000 mg | ORAL_TABLET | Freq: Once | ORAL | 11 refills | Status: DC | PRN

## 2018-11-15 MED ORDER — NARATRIPTAN HCL 2.5 MG PO TABS: ORAL_TABLET | ORAL | 11 refills | Status: DC

## 2018-11-15 MED ORDER — TRAMADOL HCL 50 MG PO TABS: 50.0000 mg | ORAL_TABLET | Freq: Four times a day (QID) | ORAL | 0 refills | Status: DC | PRN

## 2018-11-15 MED ORDER — BACLOFEN 10 MG PO TABS: 10.0000 mg | ORAL_TABLET | Freq: Three times a day (TID) | ORAL | 3 refills | Status: DC | PRN

## 2018-11-15 MED ORDER — TIZANIDINE HCL 4 MG PO TABS: 4.0000 mg | ORAL_TABLET | Freq: Three times a day (TID) | ORAL | 2 refills | Status: DC

## 2018-11-15 MED ORDER — ONABOTULINUMTOXINA 100 UNITS IJ SOLR
100.0000 [IU] | Freq: Once | INTRAMUSCULAR | Status: AC
  Administered 2018-11-15: 100 [IU] via INTRAMUSCULAR

## 2018-11-15 MED ORDER — ONDANSETRON HCL 8 MG PO TABS: 8.0000 mg | ORAL_TABLET | Freq: Three times a day (TID) | ORAL | 3 refills | Status: DC | PRN

## 2018-11-15 MED FILL — SUMATRIPTAN SUCC 100 MG TAB: 100 | 30 days supply | Qty: 9 | Fill #0

## 2018-11-15 MED FILL — BACLOFEN 10 MG TABS: 10 | 20 days supply | Qty: 60 | Fill #0

## 2018-11-15 MED FILL — NARATRIPTAN HCL 2.5 MG TAB: 2.5 | 20 days supply | Qty: 10 | Fill #0

## 2018-11-15 MED FILL — tiZANidine HCL 4 MG TABS: 4 | 30 days supply | Qty: 90 | Fill #0

## 2018-11-15 MED FILL — traMADol HCL 50 MG TABS: 50 | 5 days supply | Qty: 20 | Fill #0

## 2018-11-15 MED FILL — ONDANSETRON HCL 8 MG TABLET: 8 | 6 days supply | Qty: 20 | Fill #0

## 2018-11-15 MED FILL — EMGALITY 120 MG/ML SOAJ: 120 | 30 days supply | Qty: 1 | Fill #2

## 2018-11-15 NOTE — Patient Instructions (Signed)
Botulinum Toxin Cosmetic Injection, Care After These instructions give you information about caring for yourself after your procedure. Your doctor may also give you more specific instructions. Call your doctor if you have any problems or questions after your procedure. Follow these instructions at home:  Do not lie down for 4 hours after treatment or as told by your doctor.  Do not rub the area where you got a shot. This can spread the toxin.  Depending on where the shot was: ? Your doctor may ask you not to move your muscles in that area. Follow instructions from your doctor. ? Your doctor may tell you to frown or squint regularly. You may need to do these exercises every 15 minutes for 1 hour after treatment, or as told by your doctor. Follow instructions from your doctor.  Do not do any activities that take a lot of effort (are strenuous) for 2 hours after the procedure or for as long as told by your doctor. This includes: ? Lifting heavy items. ? Working out. ? Doing activities that make your heart beat more quickly.  Do not get laser treatments, facials, or facial massages for 1-2 weeks after the procedure or for as long as told by your doctor.  If directed, apply ice to the area where you got a shot: ? Put ice in a plastic bag. ? Place a towel between your skin and the bag. ? Leave the ice on for 20 minutes, 2-3 times per day.  Take over-the-counter and prescription medicines only as told by your doctor.  Keep all follow-up visits with your doctor. This is important. Contact a doctor if:  You have neck pain.  You have a headache that gets worse.  You feel sick to your stomach (nauseous) and it gets worse.  You feel unusually sleepy.  You have pain, tightness, weakness, or spasms in your face or neck.  Your mouth is dry.  You have trouble pooping (constipation).  You feel worried or nervous (anxious). Get help right away if:  You have chest pain.  You  faint.  You have a fever.  You have signs of an allergic reaction. These include: ? An itchy rash or welts. ? Wheezing or shortness of breath. ? Dizziness.  You have double or blurred vision.  The black centers of your eyes (pupils) start to get bigger (dilate) and are sensitive to light.  Your eyelids start to get droopy or swollen.  You have trouble speaking.  You feel short of breath or have trouble breathing.  Your voice changes or starts to sound hoarse.  You start to have trouble swallowing.  You have muscle pain, weakness, or spasms in other parts of your body. This information is not intended to replace advice given to you by your health care provider. Make sure you discuss any questions you have with your health care provider. Document Released: 06/29/2011 Document Revised: 05/04/2016 Document Reviewed: 06/02/2015 Elsevier Interactive Patient Education  2018 Elsevier Inc.  

## 2018-11-15 NOTE — Addendum Note (Signed)
Addended by: Cheree DittoGRAHAM, DEMETRICE A on: 11/15/2018 12:05 PM   Modules accepted: Orders

## 2018-11-15 NOTE — Progress Notes (Signed)
S: Pt in office today for Botox injections. Her Botox has been working very well for migraine prevention. She is using 100units every 3 months.  She continues to need her other medications.     O: BP 132/84   Pulse 72   Wt 169 lb 3.2 oz (76.7 kg)   BMI 29.04 kg/m    Botox Procedure Note Vial of Botox was : Supplied by Patient Lot # [id number] Expiration Date 03/2021   Botox Dosing by [employer] for Chronic Migraine   Injection Sites for Migraines  Botox 100 units was injected using the dosage in the table above in the pattern shown above.  A: Migraine  Muscle spasm   P: Botox 100 units injected today.  Meds refilled as requested RTC 3 Months.

## 2018-11-20 MED FILL — HYDROXYZINE PAM 50 MG CAP: 50 | 10 days supply | Qty: 60 | Fill #1

## 2018-11-21 MED FILL — TOPIRAMATE 200 MG TABLET: 200 | 30 days supply | Qty: 30 | Fill #0

## 2018-11-26 DIAGNOSIS — I10 Essential (primary) hypertension: Secondary | ICD-10-CM | POA: Diagnosis not present

## 2018-11-26 DIAGNOSIS — Z13220 Encounter for screening for lipoid disorders: Secondary | ICD-10-CM | POA: Diagnosis not present

## 2018-11-26 DIAGNOSIS — R635 Abnormal weight gain: Secondary | ICD-10-CM | POA: Diagnosis not present

## 2018-11-26 DIAGNOSIS — Z5181 Encounter for therapeutic drug level monitoring: Secondary | ICD-10-CM | POA: Diagnosis not present

## 2018-11-26 LAB — COMPREHENSIVE METABOLIC PANEL
ALBUMIN: 4.5 g/dL (ref 3.5–5.5)
ALT: 42 IU/L — AB (ref 0–32)
AST: 30 IU/L (ref 0–40)
Albumin/Globulin Ratio: 2 (ref 1.2–2.2)
Alkaline Phosphatase: 63 IU/L (ref 39–117)
BILIRUBIN TOTAL: 0.3 mg/dL (ref 0.0–1.2)
BUN/Creatinine Ratio: 29 — ABNORMAL HIGH (ref 9–23)
BUN: 20 mg/dL (ref 6–24)
CALCIUM: 9.3 mg/dL (ref 8.7–10.2)
CO2: 21 mmol/L (ref 20–29)
CREATININE: 0.7 mg/dL (ref 0.57–1.00)
Chloride: 104 mmol/L (ref 96–106)
GFR, EST AFRICAN AMERICAN: 110 mL/min/{1.73_m2} (ref >59)
GFR, EST NON AFRICAN AMERICAN: 95 mL/min/{1.73_m2} (ref >59)
GLUCOSE: 88 mg/dL (ref 65–99)
Globulin, Total: 2.2 g/dL (ref 1.5–4.5)
POTASSIUM: 3.9 mmol/L (ref 3.5–5.2)
SODIUM: 140 mmol/L (ref 134–144)
TOTAL PROTEIN: 6.7 g/dL (ref 6.0–8.5)

## 2018-11-26 LAB — CBC WITH DIFFERENTIAL/PLATELET
BASOS ABS: 0.1 10*3/uL (ref 0.0–0.2)
Basos: 1 %
EOS (ABSOLUTE): 0.3 10*3/uL (ref 0.0–0.4)
Eos: 4 %
Hematocrit: 35 % (ref 34.0–46.6)
Hemoglobin: 12.1 g/dL (ref 11.1–15.9)
IMMATURE GRANS (ABS): 0 10*3/uL (ref 0.0–0.1)
IMMATURE GRANULOCYTES: 0 %
LYMPHS: 36 %
Lymphocytes Absolute: 2.7 10*3/uL (ref 0.7–3.1)
MCH: 30.2 pg (ref 26.6–33.0)
MCHC: 34.6 g/dL (ref 31.5–35.7)
MCV: 87 fL (ref 79–97)
MONOS ABS: 0.6 10*3/uL (ref 0.1–0.9)
Monocytes: 7 %
NEUTROS ABS: 3.9 10*3/uL (ref 1.4–7.0)
NEUTROS PCT: 52 %
Platelets: 287 10*3/uL (ref 150–450)
RBC: 4.01 x10E6/uL (ref 3.77–5.28)
RDW: 12.7 % (ref 12.3–15.4)
WBC: 7.6 10*3/uL (ref 3.4–10.8)

## 2018-11-26 LAB — TSH: TSH: 1.6 u[IU]/mL (ref 0.450–4.500)

## 2018-11-26 LAB — LIPID PANEL
Chol/HDL Ratio: 1.8 ratio (ref 0.0–4.4)
Cholesterol, Total: 175 mg/dL (ref 100–199)
HDL: 100 mg/dL (ref >39)
LDL Calculated: 57 mg/dL (ref 0–99)
Triglycerides: 91 mg/dL (ref 0–149)
VLDL Cholesterol Cal: 18 mg/dL (ref 5–40)

## 2018-11-27 ENCOUNTER — Other Ambulatory Visit: Payer: Self-pay | Admitting: Physician Assistant

## 2018-11-27 MED FILL — ONDANSETRON HCL 8 MG TABLET: 8 | 6 days supply | Qty: 20 | Fill #1

## 2018-11-28 MED FILL — HYDROXYZINE PAM 50 MG CAP: 50 | 10 days supply | Qty: 60 | Fill #2

## 2018-12-02 MED FILL — PHENTERMINE 37.5 MG TABLET: 37.5 | 30 days supply | Qty: 30 | Fill #0

## 2018-12-21 ENCOUNTER — Telehealth: Payer: 59 | Admitting: Nurse Practitioner

## 2018-12-21 DIAGNOSIS — J0100 Acute maxillary sinusitis, unspecified: Secondary | ICD-10-CM | POA: Diagnosis not present

## 2018-12-21 MED ORDER — AMOXICILLIN-POT CLAVULANATE 875-125 MG PO TABS: 1.0000 | ORAL_TABLET | Freq: Two times a day (BID) | ORAL | 0 refills | Status: DC

## 2018-12-21 NOTE — Progress Notes (Signed)

## 2018-12-24 MED FILL — EMGALITY 120 MG/ML SOAJ: 120 | 30 days supply | Qty: 1 | Fill #3

## 2018-12-25 MED ORDER — FLUCONAZOLE 150 MG PO TABS: 150.0000 mg | ORAL_TABLET | Freq: Once | ORAL | 0 refills | Status: AC

## 2018-12-25 MED FILL — FLUCONAZOLE 150 MG TABS: 150 | 1 days supply | Qty: 1 | Fill #0

## 2018-12-25 MED FILL — CELECOXIB 200 MG CAP: 200 | 30 days supply | Qty: 60 | Fill #4

## 2018-12-25 NOTE — Progress Notes (Signed)

## 2018-12-25 NOTE — Addendum Note (Signed)
Addended by: Bennie Pierini on: 12/25/2018 08:52 AM   Modules accepted: Orders

## 2018-12-27 ENCOUNTER — Telehealth: Payer: Self-pay | Admitting: Physician Assistant

## 2018-12-27 DIAGNOSIS — G8929 Other chronic pain: Secondary | ICD-10-CM

## 2018-12-27 DIAGNOSIS — R51 Headache: Principal | ICD-10-CM

## 2018-12-27 NOTE — Telephone Encounter (Signed)
Pt returned call.  Headaches are continuing.  Pt concerned as they are unrelenting despite Emgality, Botox and other medications.  She has requested imaging.  MRI to be ordered.

## 2018-12-27 NOTE — Telephone Encounter (Signed)
Pt had sent message asking for CT scan to eval atypical headaches unaided by her current regimen.   I left a message for her on voicemail indicating an MRI would be more appropriate and inquiring if she is still having the same problem.  She is asked to return my call to answer the above questions and then I can order imaging.

## 2019-01-07 ENCOUNTER — Other Ambulatory Visit: Payer: Self-pay | Admitting: Family Medicine

## 2019-01-07 DIAGNOSIS — F419 Anxiety disorder, unspecified: Secondary | ICD-10-CM

## 2019-01-07 MED FILL — ROSUVASTATIN CALCIUM 40 MG: 40 | 90 days supply | Qty: 90 | Fill #1

## 2019-01-08 MED FILL — PHENTERMINE 37.5 MG TABLET: 37.5 | 20 days supply | Qty: 20 | Fill #0

## 2019-01-08 MED FILL — SERTRALINE HCL 100 MG TAB: 100 | 30 days supply | Qty: 60 | Fill #0

## 2019-01-16 ENCOUNTER — Encounter: Payer: Self-pay | Admitting: Family Medicine

## 2019-01-16 ENCOUNTER — Telehealth: Payer: Self-pay

## 2019-01-16 NOTE — Telephone Encounter (Signed)
Initial care guide visit scheduled for 2/10 at 1:00 pm

## 2019-01-17 NOTE — Telephone Encounter (Signed)
Spoke with pt. PCP has no availability next week. Pt's next availability is the week of 2/24.  Appt scheduled for 02/04/19 at 1:45pm.

## 2019-01-20 ENCOUNTER — Ambulatory Visit: Payer: 59

## 2019-01-20 MED FILL — HYDROXYZINE PAM 50 MG CAP: 50 | 10 days supply | Qty: 60 | Fill #3

## 2019-01-20 MED FILL — AMLODIPINE BESYLATE 5 MG TA: 5 | 90 days supply | Qty: 90 | Fill #1

## 2019-01-20 MED FILL — BACLOFEN 10 MG TABS: 10 | 20 days supply | Qty: 60 | Fill #1

## 2019-02-04 ENCOUNTER — Ambulatory Visit (INDEPENDENT_AMBULATORY_CARE_PROVIDER_SITE_OTHER): Payer: 59 | Admitting: Family Medicine

## 2019-02-04 ENCOUNTER — Encounter: Payer: Self-pay | Admitting: Family Medicine

## 2019-02-04 VITALS — BP 110/80 | HR 82 | Ht 64.0 in | Wt 174.0 lb

## 2019-02-04 DIAGNOSIS — F419 Anxiety disorder, unspecified: Secondary | ICD-10-CM

## 2019-02-04 DIAGNOSIS — F322 Major depressive disorder, single episode, severe without psychotic features: Secondary | ICD-10-CM | POA: Diagnosis not present

## 2019-02-04 MED ORDER — SERTRALINE HCL 100 MG PO TABS: ORAL_TABLET | ORAL | 1 refills | Status: DC

## 2019-02-04 MED ORDER — HYDROXYZINE PAMOATE 100 MG PO CAPS: 100.0000 mg | ORAL_CAPSULE | Freq: Three times a day (TID) | ORAL | 5 refills | Status: DC | PRN

## 2019-02-04 MED FILL — HYDROXYZINE PAM 50 MG CAP: 50 | 20 days supply | Qty: 60 | Fill #0

## 2019-02-04 MED FILL — SERTRALINE HCL 100 MG TAB: 100 | 90 days supply | Qty: 135 | Fill #0

## 2019-02-04 NOTE — Progress Notes (Signed)
Patient ID: [name], female    DOB: 1961/02/08  Age: 58 y.o. MRN: [id number]    Subjective:  Subjective  HPI [name] presents for f/u anxiety / depression    She has received a recommendation for a counselor and will call for an appointment  Pt states she is just not happy.    Review of Systems  Constitutional: Negative for appetite change, diaphoresis, fatigue and unexpected weight change.  Eyes: Negative for pain, redness and visual disturbance.  Respiratory: Negative for cough, chest tightness, shortness of breath and wheezing.   Cardiovascular: Negative for chest pain, palpitations and leg swelling.  Endocrine: Negative for cold intolerance, heat intolerance, polydipsia, polyphagia and polyuria.  Genitourinary: Negative for difficulty urinating, dysuria and frequency.  Neurological: Negative for dizziness, light-headedness, numbness and headaches.  Psychiatric/Behavioral: Positive for decreased concentration and dysphoric mood. Negative for self-injury, sleep disturbance and suicidal ideas. The patient is nervous/anxious.     History Past Medical History:  Diagnosis Date  . Dizziness 03/05/2017  . Essential hypertension 03/05/2017  . Hyperlipidemia 03/05/2017  . Infertility, female   . Migraines     She has a past surgical history that includes Shoulder surgery and Ankle surgery.   Her family history includes Alcohol abuse in her father and paternal grandfather; Allergic rhinitis in her mother; Cancer in her maternal grandmother; Eczema in her daughter; Heart disease in her maternal grandfather; Hypertension in her mother; Stroke in her father and another family member.She reports that she quit smoking about 27 years ago. Her smoking use included cigarettes. She has never used smokeless tobacco. She reports current alcohol use. She reports that she does not use drugs.  Current Outpatient Medications on File Prior to Visit  Medication Sig Dispense Refill  .  amLODipine (NORVASC) 5 MG tablet TAKE 1 TABLET BY MOUTH ONCE DAILY 90 tablet 3  . aspirin EC 81 MG tablet Take 81 mg by mouth daily.    . baclofen (LIORESAL) 10 MG tablet Take 1 tablet (10 mg total) by mouth every 8 (eight) hours as needed. 60 tablet 3  . celecoxib (CELEBREX) 200 MG capsule TAKE 1 CAPSULE BY MOUTH 2 TIMES DAILY. 60 capsule 11  . desonide (DESOWEN) 0.05 % ointment   0  . Fluticasone Propionate (XHANCE) 93 MCG/ACT EXHU Place 2 sprays into the nose 2 (two) times daily. 32 mL 5  . Galcanezumab-gnlm (EMGALITY) 120 MG/ML SOAJ Inject 240 mg into the skin as directed AND 120 mg every 30 (thirty) days. Inj 240mg  once then 120mg  monthly. 1 pen 11  . hydrOXYzine (ATARAX/VISTARIL) 50 MG tablet TAKE 2 TABLETS BY MOUTH 3 TIMES DAILY AS NEEDED FOR ITCHING. 60 tablet 0  . ibuprofen (ADVIL,MOTRIN) 800 MG tablet Take 800 mg by mouth every 8 (eight) hours as needed.    . magnesium oxide (MAG-OX) 400 MG tablet Take 400 mg by mouth daily.    . metoprolol succinate (TOPROL-XL) 25 MG 24 hr tablet Take 1 tablet (25 mg total) by mouth daily. 90 tablet 3  . naratriptan (AMERGE) 2.5 MG tablet TAKE 1 TABLET BY MOUTH AT ONSET OF HEADACHE. IF RETURNS OR DOES NOT RESOLVE MAY REPEAT AFTER 4 HOURS. DO NOT EXCEED 2 TABLETS IN 24 HOURS 10 tablet 11  . NONFORMULARY OR COMPOUNDED ITEM Gaba Calm as needed for anxiety    . ondansetron (ZOFRAN) 8 MG tablet Take 1 tablet (8 mg total) by mouth every 8 (eight) hours as needed. 20 tablet 3  . pantoprazole (PROTONIX) 40  MG tablet Take 1 tablet (40 mg total) by mouth daily. 90 tablet 3  . Pseudoephedrine-DM-GG (SUDAFED COUGH PO) Take by mouth.    . rosuvastatin (CRESTOR) 40 MG tablet Take 1 tablet (40 mg total) by mouth daily. <PLEASE MAKE APPOINTMENT FOR REFILLS> 90 tablet 3  . RYVENT 6 MG TABS Take 1 tablet by mouth See admin instructions. Every 6-8 hours as needed. 60 tablet 5  . SUMAtriptan (IMITREX) 100 MG tablet Take 1 tablet (100 mg total) by mouth once as needed for  migraine. 9 tablet 11  . tiZANidine (ZANAFLEX) 4 MG tablet Take 1 tablet (4 mg total) by mouth 3 (three) times daily. 90 tablet 2  . topiramate (TOPAMAX) 200 MG tablet Take 1 tablet (200 mg total) by mouth daily. 30 tablet 5  . topiramate (TOPAMAX) 200 MG tablet TAKE 1 TABLET BY MOUTH ONCE DAILY 30 tablet 11  . topiramate (TOPAMAX) 200 MG tablet TAKE 1 TABLET BY MOUTH ONCE DAILY 30 tablet 5  . traMADol (ULTRAM) 50 MG tablet Take 1 tablet (50 mg total) by mouth every 6 (six) hours as needed for severe pain. 20 tablet 0  . traZODone (DESYREL) 50 MG tablet TAKE 1/2-1 TABLET (25-50 MG) BY MOUTH AT BEDTIME AS NEEDED FOR SLEEP 90 tablet 1  . vitamin B-12 (CYANOCOBALAMIN) 500 MCG tablet Take 500 mcg by mouth daily.     No current facility-administered medications on file prior to visit.      Objective:  Objective  Physical Exam Vitals signs and nursing note reviewed.  Constitutional:      Appearance: She is well-developed.  HENT:     Head: Normocephalic and atraumatic.  Eyes:     Conjunctiva/sclera: Conjunctivae normal.  Neck:     Musculoskeletal: Normal range of motion and neck supple.     Thyroid: No thyromegaly.     Vascular: No carotid bruit or JVD.  Cardiovascular:     Rate and Rhythm: Normal rate and regular rhythm.     Heart sounds: Normal heart sounds. No murmur.  Pulmonary:     Effort: Pulmonary effort is normal. No respiratory distress.     Breath sounds: Normal breath sounds. No wheezing or rales.  Chest:     Chest wall: No tenderness.  Neurological:     Mental Status: She is alert and oriented to person, place, and time.  Psychiatric:        Attention and Perception: Attention and perception normal.        Mood and Affect: Mood is anxious and depressed.        Behavior: Behavior is withdrawn.        Thought Content: Thought content does not include homicidal or suicidal ideation. Thought content does not include homicidal or suicidal plan.        Cognition and Memory:  Cognition and memory normal.        Judgment: Judgment normal.    BP 110/80 (BP Location: Left Arm, Patient Position: Sitting, Cuff Size: Normal)   Pulse 82   Ht 5\' 4"  (1.626 m)   Wt 174 lb (78.9 kg)   SpO2 98%   BMI 29.87 kg/m  Wt Readings from Last 3 Encounters:  02/04/19 174 lb (78.9 kg)  11/15/18 169 lb 3.2 oz (76.7 kg)  09/27/18 168 lb (76.2 kg)     Lab Results  Component Value Date   WBC 7.6 11/26/2018   HGB 12.1 11/26/2018   HCT 35.0 11/26/2018   PLT 287 11/26/2018  GLUCOSE 88 11/26/2018   CHOL 175 11/26/2018   TRIG 91 11/26/2018   HDL 100 11/26/2018   LDLCALC 57 11/26/2018   ALT 42 (H) 11/26/2018   AST 30 11/26/2018   NA 140 11/26/2018   K 3.9 11/26/2018   CL 104 11/26/2018   CREATININE 0.70 11/26/2018   BUN 20 11/26/2018   CO2 21 11/26/2018   TSH 1.600 11/26/2018   HGBA1C 5.5 04/16/2015    No results found.   Assessment & Plan:  Plan  I have discontinued Marchelle Folks R. Brake's doxycycline, predniSONE, and amoxicillin-clavulanate. I have also changed her sertraline. Additionally, I am having her maintain her ibuprofen, Pseudoephedrine-DM-GG (SUDAFED COUGH PO), aspirin EC, RYVENT, Fluticasone Propionate, pantoprazole, metoprolol succinate, celecoxib, magnesium oxide, vitamin B-12, NONFORMULARY OR COMPOUNDED ITEM, desonide, Galcanezumab-gnlm, hydrOXYzine, traZODone, rosuvastatin, amLODipine, baclofen, naratriptan, ondansetron, SUMAtriptan, tiZANidine, topiramate, traMADol, topiramate, topiramate, and hydrOXYzine.  Meds ordered this encounter  Medications  . hydrOXYzine (VISTARIL) 100 MG capsule    Sig: Take 1 capsule (100 mg total) by mouth 3 (three) times daily as needed for itching.    Dispense:  30 capsule    Refill:  5  . sertraline (ZOLOFT) 100 MG tablet    Sig: TAKE 1 1/2 TABLETS BY MOUTH DAILY. Needs ov before any more refills    Dispense:  135 tablet    Refill:  1    Problem List Items Addressed This Visit      Unprioritized   Anxiety    Relevant Medications   hydrOXYzine (VISTARIL) 100 MG capsule   sertraline (ZOLOFT) 100 MG tablet    Other Visit Diagnoses    Depression, major, single episode, severe (HCC)    -  Primary   Relevant Medications   hydrOXYzine (VISTARIL) 100 MG capsule   sertraline (ZOLOFT) 100 MG tablet    increase the zoloft to 1 1/2 tab daily   Follow-up: Return in about 3 months (around 05/05/2019), or if symptoms worsen or fail to improve, for depresion, anxiety.  Donato Schultz, DO

## 2019-02-04 NOTE — Patient Instructions (Signed)
Persistent Depressive Disorder  Persistent depressive disorder (PDD) is a mental health condition. PDD causes symptoms of low-level depression for 2 years or longer. It may also be called long-term (chronic) depression or dysthymia. PDD may include episodes of more severe depression that last for about 2 weeks (major depressive disorder or MDD). PDD can affect the way you think, feel, and sleep. This condition may also affect your relationships. You may be more likely to get sick if you have PDD. Symptoms of PDD occur for most of the day and may include:  Feeling tired (fatigue).  Low energy.  Eating too much or too little.  Sleeping too much or too little.  Feeling restless or agitated.  Feeling hopeless.  Feeling worthless or guilty.  Feeling worried or nervous (anxiety).  Trouble concentrating or making decisions.  Low self-esteem.  A negative way of looking at things (outlook).  Not being able to have fun or feel pleasure.  Avoiding interacting with people.  Getting angry or annoyed easily (irritability).  Acting aggressive or angry. Follow these instructions at home: Activity  Go back to your normal activities as told by your doctor.  Exercise regularly as told by your doctor. General instructions  Take over-the-counter and prescription medicines only as told by your doctor.  Do not drink alcohol. Or, limit how much alcohol you drink to no more than 1 drink a day for nonpregnant women and 2 drinks a day for men. One drink equals 12 oz of beer, 5 oz of wine, or 1 oz of hard liquor. Alcohol can affect any antidepressant medicines you are taking. Talk with your doctor about your alcohol use.  Eat a healthy diet and get plenty of sleep.  Find activities that you enjoy each day.  Consider joining a support group. Your doctor may be able to suggest a support group.  Keep all follow-up visits as told by your doctor. This is important. Where to find more  information National Alliance on Mental Illness  www.nami.org [place] National Institute of Mental Health  www.nimh.nih.gov National Suicide Prevention Lifeline  (1-800-273-8255).  This is free, 24-hour help. Contact a doctor if:  Your symptoms get worse.  You have new symptoms.  You have trouble sleeping or doing your daily activities. Get help right away if:  You self-harm.  You have serious thoughts about hurting yourself or others.  You see, hear, taste, smell, or feel things that are not there (hallucinate). This information is not intended to replace advice given to you by your health care provider. Make sure you discuss any questions you have with your health care provider. Document Released: 11/08/2015 Document Revised: 07/21/2016 Document Reviewed: 07/21/2016 Elsevier Interactive Patient Education  2019 [employer].  

## 2019-02-10 MED FILL — EMGALITY 120 MG/ML SOAJ: 120 | 30 days supply | Qty: 1 | Fill #4

## 2019-02-11 MED FILL — PHENTERMINE 37.5 MG TABLET: 37.5 | 30 days supply | Qty: 30 | Fill #0

## 2019-02-12 ENCOUNTER — Other Ambulatory Visit: Payer: Self-pay | Admitting: Family Medicine

## 2019-02-12 DIAGNOSIS — K219 Gastro-esophageal reflux disease without esophagitis: Secondary | ICD-10-CM

## 2019-02-12 MED FILL — ONDANSETRON HCL 8 MG TABLET: 8 | 6 days supply | Qty: 20 | Fill #2

## 2019-02-12 MED FILL — tiZANidine HCL 4 MG TABS: 4 | 30 days supply | Qty: 90 | Fill #1

## 2019-02-12 MED FILL — traZODone HCL 50 MG TABS: 50 | 90 days supply | Qty: 90 | Fill #1

## 2019-02-12 MED FILL — CELECOXIB 200 MG CAP: 200 | 30 days supply | Qty: 60 | Fill #5

## 2019-02-13 MED FILL — PANTOPRAZOLE SOD DR 40 MG T: 40 | 90 days supply | Qty: 90 | Fill #0

## 2019-02-20 MED FILL — BOTOX 100 UNITS VIAL: 100 | 84 days supply | Qty: 1 | Fill #1

## 2019-02-25 ENCOUNTER — Encounter: Payer: Self-pay | Admitting: *Deleted

## 2019-02-26 ENCOUNTER — Other Ambulatory Visit: Payer: Self-pay | Admitting: Family Medicine

## 2019-02-26 ENCOUNTER — Other Ambulatory Visit: Payer: Self-pay

## 2019-02-26 ENCOUNTER — Ambulatory Visit (INDEPENDENT_AMBULATORY_CARE_PROVIDER_SITE_OTHER): Payer: 59

## 2019-02-26 DIAGNOSIS — Z1239 Encounter for other screening for malignant neoplasm of breast: Secondary | ICD-10-CM

## 2019-02-26 DIAGNOSIS — Z1231 Encounter for screening mammogram for malignant neoplasm of breast: Secondary | ICD-10-CM | POA: Diagnosis not present

## 2019-04-01 MED FILL — BACLOFEN 10 MG TABS: 10 | 20 days supply | Qty: 60 | Fill #2

## 2019-04-01 MED FILL — HYDROXYZINE PAM 50 MG CAP: 50 | 10 days supply | Qty: 60 | Fill #1

## 2019-04-02 MED FILL — EMGALITY 120 MG/ML SOAJ: 120 | 30 days supply | Qty: 1 | Fill #5

## 2019-04-02 MED FILL — NARATRIPTAN HCL 2.5 MG TAB: 2.5 | 20 days supply | Qty: 10 | Fill #1

## 2019-04-04 ENCOUNTER — Ambulatory Visit (INDEPENDENT_AMBULATORY_CARE_PROVIDER_SITE_OTHER): Payer: 59 | Admitting: Physician Assistant

## 2019-04-04 ENCOUNTER — Other Ambulatory Visit: Payer: Self-pay

## 2019-04-04 ENCOUNTER — Encounter: Payer: Self-pay | Admitting: Physician Assistant

## 2019-04-04 VITALS — BP 116/79 | HR 90

## 2019-04-04 DIAGNOSIS — G43009 Migraine without aura, not intractable, without status migrainosus: Secondary | ICD-10-CM | POA: Diagnosis not present

## 2019-04-04 DIAGNOSIS — G43709 Chronic migraine without aura, not intractable, without status migrainosus: Secondary | ICD-10-CM

## 2019-04-04 DIAGNOSIS — IMO0002 Reserved for concepts with insufficient information to code with codable children: Secondary | ICD-10-CM

## 2019-04-04 DIAGNOSIS — M62838 Other muscle spasm: Secondary | ICD-10-CM

## 2019-04-04 MED ORDER — RIMEGEPANT SULFATE 75 MG PO TBDP: 1.0000 | ORAL_TABLET | ORAL | 11 refills | Status: DC | PRN

## 2019-04-04 MED ORDER — TRAMADOL HCL 50 MG PO TABS: 50.0000 mg | ORAL_TABLET | Freq: Four times a day (QID) | ORAL | 0 refills | Status: DC | PRN

## 2019-04-04 MED ORDER — ONABOTULINUMTOXINA 100 UNITS IJ SOLR
100.0000 [IU] | Freq: Once | INTRAMUSCULAR | Status: AC
  Administered 2019-04-04: 100 [IU] via INTRAMUSCULAR

## 2019-04-04 MED FILL — traMADol HCL 50 MG TABS: 50 | 5 days supply | Qty: 20 | Fill #0

## 2019-04-04 NOTE — Patient Instructions (Signed)
Botulinum Toxin Cosmetic Injection Botulinum toxin injection is a shot. This shot is used to soften wrinkles in the face. Tell a doctor about:  Any allergies you have. This includes allergies to eggs.  All the medicines you take. This includes: ? Antibiotics. ? Vitamins. ? Herbs. ? Eye drops. ? Creams. ? Over-the-counter medicines.  Any problems you or your family have had with the use of anesthetics.  Any blood problems you have.  Any surgeries you have had.  Any medical conditions you have.  If you are pregnant or may be pregnant.  If you are breastfeeding. What are the risks? Generally, this is a safe procedure, but there may be problems. Common problems include:  Pain.  Swelling.  Bruising. Other problems include:  Jaw pain.  Neck pain.  Infection.  Allergic reaction to the shot.  Damage to nearby: ? Structures. ? Organs.  Drooping eyebrow or eyelid.  Double vision.  Blurred vision.  Changes in voice or speech.  Losing the ability to close one or both eyes.  Trouble swallowing. What happens before the procedure?  Do not drink any alcohol as told by your doctor.  Ask your doctor about: ? Changing or stopping your regular medicines. This is important if you take diabetes medicines or blood thinners. ? Taking medicines such as aspirin and ibuprofen. These medicines can thin your blood. Do not take these medicines before your procedure if your doctor tells you not to.  Plan to have someone take you home after the procedure, if told by your doctor. What happens during the procedure?  You may be given a medicine to numb the area (local anesthetic).  Your doctor will give you some shots of the toxin. How many shots you will get depends on: ? Your condition. ? Which area is being treated. What happens after the procedure?  Follow up with your doctor as told.  Do not drive for 24 hours if you were given a medicine to help you relax (sedative).  This information is not intended to replace advice given to you by your health care provider. Make sure you discuss any questions you have with your health care provider. Document Released: 12/30/2010 Document Revised: 05/04/2016 Document Reviewed: 06/02/2015 Elsevier Interactive Patient Education  2019 Elsevier Inc.  

## 2019-04-04 NOTE — Progress Notes (Signed)
History:  [name] is a 58 y.o. G1P1001 who presents to clinic today for botox and headache followup.  She is not sleeping well.  Headaches have been worse.  Right shoulder blade with a muscle in spasm.  She has moved to a new, smaller home away from her soon-to-be-ex husband.  She requests something new to try for acute pain.  She is still using Emgality and Topamax regularly for migraine and still having Botox as able.  (Was unable to get appt on time this time.)    Past Medical History:  Diagnosis Date  . Dizziness 03/05/2017  . Essential hypertension 03/05/2017  . Hyperlipidemia 03/05/2017  . Infertility, female   . Migraines     Social History   Socioeconomic History  . Marital status: Married    Spouse name: Not on file  . Number of children: Not on file  . Years of education: Not on file  . Highest education level: Not on file  Occupational History    Employer: [employer]    Comment: [employer]  Social Needs  . Financial resource strain: Not on file  . Food insecurity:    Worry: Not on file    Inability: Not on file  . Transportation needs:    Medical: Not on file    Non-medical: Not on file  Tobacco Use  . Smoking status: Former Smoker    Types: Cigarettes    Last attempt to quit: 02/09/1992    Years since quitting: 27.1  . Smokeless tobacco: Never Used  Substance and Sexual Activity  . Alcohol use: Yes    Comment: 1-2 weekly  . Drug use: No  . Sexual activity: Yes    Partners: Male  Lifestyle  . Physical activity:    Days per week: Not on file    Minutes per session: Not on file  . Stress: Not on file  Relationships  . Social connections:    Talks on phone: Not on file    Gets together: Not on file    Attends religious service: Not on file    Active member of club or organization: Not on file    Attends meetings of clubs or organizations: Not on file    Relationship status: Not on file  . Intimate partner violence:    Fear of  current or ex partner: Not on file    Emotionally abused: Not on file    Physically abused: Not on file    Forced sexual activity: Not on file  Other Topics Concern  . Not on file  Social History Narrative  . Not on file    Family History  Problem Relation Age of Onset  . Hypertension Mother   . Allergic rhinitis Mother   . Cancer Maternal Grandmother   . Stroke Other   . Eczema Daughter   . Alcohol abuse Father   . Stroke Father   . Heart disease Maternal Grandfather   . Alcohol abuse Paternal Grandfather     No Known Allergies  Current Outpatient Medications on File Prior to Visit  Medication Sig Dispense Refill  . amLODipine (NORVASC) 5 MG tablet TAKE 1 TABLET BY MOUTH ONCE DAILY 90 tablet 3  . aspirin EC 81 MG tablet Take 81 mg by mouth daily.    . baclofen (LIORESAL) 10 MG tablet Take 1 tablet (10 mg total) by mouth every 8 (eight) hours as needed. 60 tablet 3  . celecoxib (CELEBREX) 200 MG capsule TAKE 1  CAPSULE BY MOUTH 2 TIMES DAILY. 60 capsule 11  . desonide (DESOWEN) 0.05 % ointment   0  . Fluticasone Propionate (XHANCE) 93 MCG/ACT EXHU Place 2 sprays into the nose 2 (two) times daily. 32 mL 5  . Galcanezumab-gnlm (EMGALITY) 120 MG/ML SOAJ Inject 240 mg into the skin as directed AND 120 mg every 30 (thirty) days. Inj 240mg  once then 120mg  monthly. 1 pen 11  . hydrOXYzine (ATARAX/VISTARIL) 50 MG tablet TAKE 2 TABLETS BY MOUTH 3 TIMES DAILY AS NEEDED FOR ITCHING. 60 tablet 0  . hydrOXYzine (VISTARIL) 100 MG capsule Take 1 capsule (100 mg total) by mouth 3 (three) times daily as needed for itching. 30 capsule 5  . ibuprofen (ADVIL,MOTRIN) 800 MG tablet Take 800 mg by mouth every 8 (eight) hours as needed.    . magnesium oxide (MAG-OX) 400 MG tablet Take 400 mg by mouth daily.    . metoprolol succinate (TOPROL-XL) 25 MG 24 hr tablet Take 1 tablet (25 mg total) by mouth daily. 90 tablet 3  . naratriptan (AMERGE) 2.5 MG tablet TAKE 1 TABLET BY MOUTH AT ONSET OF HEADACHE. IF  RETURNS OR DOES NOT RESOLVE MAY REPEAT AFTER 4 HOURS. DO NOT EXCEED 2 TABLETS IN 24 HOURS 10 tablet 11  . NONFORMULARY OR COMPOUNDED ITEM Gaba Calm as needed for anxiety    . ondansetron (ZOFRAN) 8 MG tablet Take 1 tablet (8 mg total) by mouth every 8 (eight) hours as needed. 20 tablet 3  . pantoprazole (PROTONIX) 40 MG tablet TAKE 1 TABLET (40 MG TOTAL) BY MOUTH DAILY. 90 tablet 3  . Pseudoephedrine-DM-GG (SUDAFED COUGH PO) Take by mouth.    . rosuvastatin (CRESTOR) 40 MG tablet Take 1 tablet (40 mg total) by mouth daily. <PLEASE MAKE APPOINTMENT FOR REFILLS> 90 tablet 3  . RYVENT 6 MG TABS Take 1 tablet by mouth See admin instructions. Every 6-8 hours as needed. 60 tablet 5  . sertraline (ZOLOFT) 100 MG tablet TAKE 1 1/2 TABLETS BY MOUTH DAILY. Needs ov before any more refills 135 tablet 1  . SUMAtriptan (IMITREX) 100 MG tablet Take 1 tablet (100 mg total) by mouth once as needed for migraine. 9 tablet 11  . tiZANidine (ZANAFLEX) 4 MG tablet Take 1 tablet (4 mg total) by mouth 3 (three) times daily. 90 tablet 2  . topiramate (TOPAMAX) 200 MG tablet Take 1 tablet (200 mg total) by mouth daily. 30 tablet 5  . topiramate (TOPAMAX) 200 MG tablet TAKE 1 TABLET BY MOUTH ONCE DAILY 30 tablet 11  . topiramate (TOPAMAX) 200 MG tablet TAKE 1 TABLET BY MOUTH ONCE DAILY 30 tablet 5  . traMADol (ULTRAM) 50 MG tablet Take 1 tablet (50 mg total) by mouth every 6 (six) hours as needed for severe pain. 20 tablet 0  . traZODone (DESYREL) 50 MG tablet TAKE 1/2-1 TABLET (25-50 MG) BY MOUTH AT BEDTIME AS NEEDED FOR SLEEP 90 tablet 1  . vitamin B-12 (CYANOCOBALAMIN) 500 MCG tablet Take 500 mcg by mouth daily.     No current facility-administered medications on file prior to visit.      Review of Systems:  All pertinent positive/negative included in HPI, all other review of systems are negative   Objective:  Physical Exam BP 116/79   Pulse 90  CONSTITUTIONAL: Well-developed, well-nourished female in no acute  distress.  EYES: EOM intact ENT: Normocephalic CARDIOVASCULAR: Regular rate RESPIRATORY: Normal rate.  MUSCULOSKELETAL: Normal ROMmuscle spasm noted SKIN: Warm, dry without erythema  NEUROLOGICAL: Alert, oriented, CN  II-XII grossly intact, Appropriate balance, No dysmetria, Sensation equal bilaterally, Romberg negative.   PSYCH: Normal behavior, mood  Botox Procedure Note Vial of Botox was : Supplied by Patient Lot # [id number] Expiration Date 07/2021  Botox Dosing by [facility] for Chronic Migraine   Injection Sites for Migraines  Botox 100 units was injected using the dosage in the table above in the pattern shown above.      Assessment & Plan:  Assessment: 1. Migraine without aura and without status migrainosus, not intractable   2. Chronic migraine   3. Muscle spasm    Migraine not improving.    Plan: Botox 100 units injected today in above pattern Meds refilled as needed Will begin new medicine as her headaches have continued to worsen: Nurtec ODT for acute migraine.  Directions discussed.  Samples provided.  Follow-up in 3 months or sooner PRN  Charlyne Petrineague Clark,  E, PA-C 04/04/2019 9:36 AM

## 2019-04-09 ENCOUNTER — Encounter: Payer: Self-pay | Admitting: *Deleted

## 2019-04-09 MED FILL — CELECOXIB 200 MG CAPSULE: 200 | 30 days supply | Qty: 60 | Fill #6

## 2019-04-09 MED FILL — TOPIRAMATE 200 MG TABLET: 200 | 30 days supply | Qty: 30 | Fill #0

## 2019-04-14 MED FILL — NURTEC 75 MG TBDP: 75 | 30 days supply | Qty: 8 | Fill #0

## 2019-05-13 ENCOUNTER — Telehealth: Payer: Self-pay | Admitting: Radiology

## 2019-05-13 DIAGNOSIS — F4321 Adjustment disorder with depressed mood: Secondary | ICD-10-CM | POA: Diagnosis not present

## 2019-05-13 NOTE — Telephone Encounter (Signed)
Left message to call [id number] to schedule appointment in July for Botox injections with Nada Maclachlan

## 2019-05-15 MED FILL — BACLOFEN 10 MG TABS: 10 | 20 days supply | Qty: 60 | Fill #3

## 2019-05-15 MED FILL — PANTOPRAZOLE SOD DR 40 MG T: 40 | 90 days supply | Qty: 90 | Fill #1

## 2019-05-15 MED FILL — HYDROXYZINE PAM 50 MG CAP: 50 | 10 days supply | Qty: 60 | Fill #2

## 2019-05-15 MED FILL — NURTEC 75 MG TBDP: 75 | 30 days supply | Qty: 8 | Fill #0

## 2019-05-16 ENCOUNTER — Other Ambulatory Visit: Payer: Self-pay | Admitting: *Deleted

## 2019-05-16 ENCOUNTER — Other Ambulatory Visit: Payer: Self-pay | Admitting: Physician Assistant

## 2019-05-16 DIAGNOSIS — G4489 Other headache syndrome: Secondary | ICD-10-CM

## 2019-05-16 DIAGNOSIS — M542 Cervicalgia: Secondary | ICD-10-CM

## 2019-05-16 MED ORDER — CELECOXIB 200 MG PO CAPS: ORAL_CAPSULE | ORAL | 11 refills | Status: DC

## 2019-05-16 MED FILL — CELECOXIB 200 MG CAP: 200 | 30 days supply | Qty: 60 | Fill #0

## 2019-05-16 MED FILL — ROSUVASTATIN CALCIUM 40 MG: 40 | 90 days supply | Qty: 90 | Fill #2

## 2019-05-16 MED FILL — TOPIRAMATE 200 MG TABLET: 200 | 30 days supply | Qty: 30 | Fill #1

## 2019-05-27 DIAGNOSIS — F4321 Adjustment disorder with depressed mood: Secondary | ICD-10-CM | POA: Diagnosis not present

## 2019-06-04 ENCOUNTER — Telehealth: Payer: Self-pay | Admitting: *Deleted

## 2019-06-04 DIAGNOSIS — R58 Hemorrhage, not elsewhere classified: Secondary | ICD-10-CM | POA: Diagnosis not present

## 2019-06-04 NOTE — Telephone Encounter (Signed)
-----   Message from Serafina Mitchell, MD sent at 06/04/2019  4:39 PM EDT ----- I spoke with the vein center tonight.  This is a [facility] at [facility].  She came in bleeding.  They felt she had a arterial bleed.  It stopped with pressure dressing.  I told her to call the office to be seen tomorrow, or if she bleeds again tonight to come to the ER

## 2019-06-05 ENCOUNTER — Other Ambulatory Visit: Payer: Self-pay

## 2019-06-05 ENCOUNTER — Ambulatory Visit (INDEPENDENT_AMBULATORY_CARE_PROVIDER_SITE_OTHER): Payer: 59 | Admitting: Vascular Surgery

## 2019-06-05 ENCOUNTER — Encounter: Payer: Self-pay | Admitting: Vascular Surgery

## 2019-06-05 VITALS — BP 99/74 | HR 90 | Temp 97.3°F | Resp 20 | Ht 64.0 in | Wt 175.0 lb

## 2019-06-05 DIAGNOSIS — I83891 Varicose veins of right lower extremities with other complications: Secondary | ICD-10-CM | POA: Diagnosis not present

## 2019-06-05 NOTE — Progress Notes (Signed)
Referring Physician: Dr Jones Skene  Patient name: [name] MRN: [id number] DOB: 1961-06-08 Sex: female  REASON FOR CONSULT: Bleeding right thigh  HPI: [name] is a 58 y.o. female, who had an episode of bleeding from what sounds like a varicosity that was scraped by a razor on her right thigh yesterday.  She presented to [facility] because she could not get the bleeding to stop with pressure.  They did some type of Doppler study on her and told her it was a superficial arterial bleed.  A pressure dressing was applied there.  She had no further bleeding after that.  She was sent here for further follow-up.  Patient states that she has had a bluish discoloration on her anterior thigh as far back as she can remember dating back to her teenage years.  She has never had any bleeding episodes from this.  She does not really complain of varicose veins.  She has no tiredness fullness heaviness or achiness in her legs.  She has no family history of varicose veins.  He has no history of DVT.  She has no history of easy bleeding.  She is on aspirin.  Other medical problems include hypertension and migraines both of which have been stable.  Past Medical History:  Diagnosis Date  . Dizziness 03/05/2017  . Essential hypertension 03/05/2017  . Hyperlipidemia 03/05/2017  . Infertility, female   . Migraines    Past Surgical History:  Procedure Laterality Date  . ANKLE SURGERY     Left  . SHOULDER SURGERY     Left    Family History  Problem Relation Age of Onset  . Hypertension Mother   . Allergic rhinitis Mother   . Cancer Maternal Grandmother   . Stroke Other   . Eczema Daughter   . Alcohol abuse Father   . Stroke Father   . Heart disease Maternal Grandfather   . Alcohol abuse Paternal Grandfather     SOCIAL HISTORY: Social History   Socioeconomic History  . Marital status: Married    Spouse name: Not on file  . Number of children: Not on file  . Years of  education: Not on file  . Highest education level: Not on file  Occupational History    Employer: [employer]    Comment: [employer]  Social Needs  . Financial resource strain: Not on file  . Food insecurity    Worry: Not on file    Inability: Not on file  . Transportation needs    Medical: Not on file    Non-medical: Not on file  Tobacco Use  . Smoking status: Former Smoker    Types: Cigarettes    Quit date: 02/09/1992    Years since quitting: 27.3  . Smokeless tobacco: Never Used  Substance and Sexual Activity  . Alcohol use: Yes    Comment: 1-2 weekly  . Drug use: No  . Sexual activity: Yes    Partners: Male  Lifestyle  . Physical activity    Days per week: Not on file    Minutes per session: Not on file  . Stress: Not on file  Relationships  . Social Herbalist on phone: Not on file    Gets together: Not on file    Attends religious service: Not on file    Active member of club or organization: Not on file    Attends meetings of clubs or organizations: Not on file  Relationship status: Not on file  . Intimate partner violence    Fear of current or ex partner: Not on file    Emotionally abused: Not on file    Physically abused: Not on file    Forced sexual activity: Not on file  Other Topics Concern  . Not on file  Social History Narrative  . Not on file    Allergies  Allergen Reactions  . Augmentin [Amoxicillin-Pot Clavulanate]     diarrhea    Current Outpatient Medications  Medication Sig Dispense Refill  . amLODipine (NORVASC) 5 MG tablet TAKE 1 TABLET BY MOUTH ONCE DAILY 90 tablet 3  . aspirin EC 81 MG tablet Take 81 mg by mouth daily.    . baclofen (LIORESAL) 10 MG tablet Take 1 tablet (10 mg total) by mouth every 8 (eight) hours as needed. 60 tablet 3  . celecoxib (CELEBREX) 200 MG capsule TAKE 1 CAPSULE BY MOUTH 2 TIMES DAILY. 60 capsule 11  . desonide (DESOWEN) 0.05 % ointment   0  . Fluticasone Propionate (XHANCE) 93  MCG/ACT EXHU Place 2 sprays into the nose 2 (two) times daily. 32 mL 5  . Galcanezumab-gnlm (EMGALITY) 120 MG/ML SOAJ Inject 240 mg into the skin as directed AND 120 mg every 30 (thirty) days. Inj 240mg  once then 120mg  monthly. 1 pen 11  . hydrOXYzine (ATARAX/VISTARIL) 50 MG tablet TAKE 2 TABLETS BY MOUTH 3 TIMES DAILY AS NEEDED FOR ITCHING. 60 tablet 0  . ibuprofen (ADVIL,MOTRIN) 800 MG tablet Take 800 mg by mouth every 8 (eight) hours as needed.    . magnesium oxide (MAG-OX) 400 MG tablet Take 400 mg by mouth daily.    . metoprolol succinate (TOPROL-XL) 25 MG 24 hr tablet Take 1 tablet (25 mg total) by mouth daily. 90 tablet 3  . naratriptan (AMERGE) 2.5 MG tablet TAKE 1 TABLET BY MOUTH AT ONSET OF HEADACHE. IF RETURNS OR DOES NOT RESOLVE MAY REPEAT AFTER 4 HOURS. DO NOT EXCEED 2 TABLETS IN 24 HOURS 10 tablet 11  . NONFORMULARY OR COMPOUNDED ITEM Gaba Calm as needed for anxiety    . ondansetron (ZOFRAN) 8 MG tablet Take 1 tablet (8 mg total) by mouth every 8 (eight) hours as needed. 20 tablet 3  . pantoprazole (PROTONIX) 40 MG tablet TAKE 1 TABLET (40 MG TOTAL) BY MOUTH DAILY. 90 tablet 3  . Pseudoephedrine-DM-GG (SUDAFED COUGH PO) Take by mouth.    . Rimegepant Sulfate (NURTEC) 75 MG TBDP Take 1 tablet by mouth as needed. 8 tablet 11  . rosuvastatin (CRESTOR) 40 MG tablet Take 1 tablet (40 mg total) by mouth daily. <PLEASE MAKE APPOINTMENT FOR REFILLS> 90 tablet 3  . sertraline (ZOLOFT) 100 MG tablet TAKE 1 1/2 TABLETS BY MOUTH DAILY. Needs ov before any more refills 135 tablet 1  . SUMAtriptan (IMITREX) 100 MG tablet Take 1 tablet (100 mg total) by mouth once as needed for migraine. 9 tablet 11  . tiZANidine (ZANAFLEX) 4 MG tablet Take 1 tablet (4 mg total) by mouth 3 (three) times daily. 90 tablet 2  . topiramate (TOPAMAX) 200 MG tablet Take 1 tablet (200 mg total) by mouth daily. 30 tablet 5  . traMADol (ULTRAM) 50 MG tablet Take 1 tablet (50 mg total) by mouth every 6 (six) hours as needed  for severe pain. 20 tablet 0  . traZODone (DESYREL) 50 MG tablet TAKE 1/2-1 TABLET (25-50 MG) BY MOUTH AT BEDTIME AS NEEDED FOR SLEEP 90 tablet 1  . vitamin B-12 (  CYANOCOBALAMIN) 500 MCG tablet Take 500 mcg by mouth daily.     No current facility-administered medications for this visit.     ROS:   General:  No weight loss, Fever, chills  HEENT: No recent headaches, no nasal bleeding, no visual changes, no sore throat  Neurologic: No dizziness, blackouts, seizures. No recent symptoms of stroke or mini- stroke. No recent episodes of slurred speech, or temporary blindness.  Cardiac: No recent episodes of chest pain/pressure, no shortness of breath at rest.  No shortness of breath with exertion.  Denies history of atrial fibrillation or irregular heartbeat  Vascular: No history of rest pain in feet.  No history of claudication.  No history of non-healing ulcer, No history of DVT   Pulmonary: No home oxygen, no productive cough, no hemoptysis,  No asthma or wheezing  Musculoskeletal:  [ ]  Arthritis, [ ]  Low back pain,  [ ]  Joint pain  Hematologic:No history of hypercoagulable state.  No history of easy bleeding.  No history of anemia  Gastrointestinal: No hematochezia or melena,  No gastroesophageal reflux, no trouble swallowing  Urinary: [ ]  chronic Kidney disease, [ ]  on HD - [ ]  MWF or [ ]  TTHS, [ ]  Burning with urination, [ ]  Frequent urination, [ ]  Difficulty urinating;   Skin: No rashes  Psychological: No history of anxiety,  No history of depression   Physical Examination  Vitals:   06/05/19 1309  BP: 99/74  Pulse: 90  Resp: 20  Temp: (!) 97.3 F (36.3 C)  SpO2: 99%  Weight: 175 lb (79.4 kg)  Height: 5\' 4"  (1.626 m)    Body mass index is 30.04 kg/m.  General:  Alert and oriented, no acute distress HEENT: Normal Neck: No JVD Pulmonary: Clear to auscultation bilaterally Cardiac: Regular Rate and Rhythm Abdomen: Soft, non-tender, non-distended, no mass Skin: No  rash, better spider type varicosities in the anterior thigh and calf bilaterally.  There is a 5 x 5 mm circular ulceration on the anterior aspect of her right thigh approximately 5 cm above the patella with eschar on it it is nonpulsatile, there is induration around this extending to about 3 cm Extremity Pulses:  2+ radial, brachial, femoral, dorsalis pedis, posterior tibial pulses bilaterally Musculoskeletal: No deformity or edema  Neurologic: Upper and lower extremity motor 5/5 and symmetric  DATA: I spoke directly with Dr. Jimmie MollyGreenberg from [facility].  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  In the meanwhile we will get her scheduled for an MRI scan of her right anterior thigh to make sure this is not an AVM before we decide on a treatment plan.  When she returns for follow-up to see me in a few weeks she will have a duplex ultrasound for reflux of both lower extremities to see if this is a component as well.  Fabienne Brunsharles Isac Lincks, MD [facility] Office: [phone] Pager: [phone]

## 2019-06-06 ENCOUNTER — Other Ambulatory Visit: Payer: Self-pay

## 2019-06-06 DIAGNOSIS — I83891 Varicose veins of right lower extremities with other complications: Secondary | ICD-10-CM

## 2019-06-17 ENCOUNTER — Other Ambulatory Visit: Payer: Self-pay | Admitting: Family Medicine

## 2019-06-17 DIAGNOSIS — G4700 Insomnia, unspecified: Secondary | ICD-10-CM

## 2019-06-17 MED FILL — HYDROXYZINE PAM 50 MG CAP: 50 | 10 days supply | Qty: 60 | Fill #3

## 2019-06-17 MED FILL — EMGALITY 120 MG/ML SOAJ: 120 | 30 days supply | Qty: 1 | Fill #6

## 2019-06-17 MED FILL — tiZANidine HCL 4 MG TABS: 4 | 30 days supply | Qty: 90 | Fill #2

## 2019-06-17 MED FILL — AMLODIPINE BESYLATE 5 MG TA: 5 | 90 days supply | Qty: 90 | Fill #2

## 2019-06-17 MED FILL — TOPIRAMATE 200 MG TABLET: 200 | 30 days supply | Qty: 30 | Fill #2

## 2019-06-17 MED FILL — traZODone HCL 50 MG TABS: 50 | 30 days supply | Qty: 30 | Fill #0

## 2019-06-17 MED FILL — BOTOX 100 UNITS VIAL: 100 | 84 days supply | Qty: 1 | Fill #2

## 2019-06-20 ENCOUNTER — Encounter: Payer: Self-pay | Admitting: Physician Assistant

## 2019-06-20 ENCOUNTER — Ambulatory Visit (INDEPENDENT_AMBULATORY_CARE_PROVIDER_SITE_OTHER): Payer: 59 | Admitting: Physician Assistant

## 2019-06-20 ENCOUNTER — Other Ambulatory Visit: Payer: Self-pay

## 2019-06-20 DIAGNOSIS — M542 Cervicalgia: Secondary | ICD-10-CM | POA: Diagnosis not present

## 2019-06-20 DIAGNOSIS — G43009 Migraine without aura, not intractable, without status migrainosus: Secondary | ICD-10-CM | POA: Diagnosis not present

## 2019-06-20 DIAGNOSIS — M62838 Other muscle spasm: Secondary | ICD-10-CM

## 2019-06-20 DIAGNOSIS — G43809 Other migraine, not intractable, without status migrainosus: Secondary | ICD-10-CM

## 2019-06-20 MED ORDER — TRAMADOL HCL 50 MG PO TABS: 50.0000 mg | ORAL_TABLET | Freq: Four times a day (QID) | ORAL | 0 refills | Status: DC | PRN

## 2019-06-20 MED ORDER — EMGALITY 120 MG/ML ~~LOC~~ SOAJ: SUBCUTANEOUS | 11 refills | Status: DC

## 2019-06-20 MED ORDER — TOPIRAMATE 200 MG PO TABS: 200.0000 mg | ORAL_TABLET | Freq: Every day | ORAL | 5 refills | Status: DC

## 2019-06-20 MED ORDER — ONABOTULINUMTOXINA 100 UNITS IJ SOLR
100.0000 [IU] | Freq: Once | INTRAMUSCULAR | Status: AC
  Administered 2019-06-20: 11:00:00 100 [IU] via INTRAMUSCULAR

## 2019-06-20 MED ORDER — TIZANIDINE HCL 4 MG PO TABS: 4.0000 mg | ORAL_TABLET | Freq: Three times a day (TID) | ORAL | 2 refills | Status: DC

## 2019-06-20 MED ORDER — BACLOFEN 10 MG PO TABS: 10.0000 mg | ORAL_TABLET | Freq: Three times a day (TID) | ORAL | 3 refills | Status: DC | PRN

## 2019-06-20 MED ORDER — ONDANSETRON HCL 8 MG PO TABS: 8.0000 mg | ORAL_TABLET | Freq: Three times a day (TID) | ORAL | 3 refills | Status: DC | PRN

## 2019-06-20 MED FILL — ONDANSETRON HCL 8 MG TABLET: 8 | 7 days supply | Qty: 20 | Fill #0

## 2019-06-20 MED FILL — BACLOFEN 10 MG TABS: 10 | 20 days supply | Qty: 60 | Fill #0

## 2019-06-20 NOTE — Progress Notes (Signed)
S: Pt in office today for Botox injections. Her Botox has been working very well for migraine prevention along with her other medications.    O: BP 128/85   Pulse (!) 57    Botox Procedure Note Vial of Botox was : Supplied by Patient Lot # [id number] Expiration Date 01/2022  Botox Dosing by [facility] for Chronic Migraine   Injection Sites for Migraines  Botox 100 units was injected using the dosage in the table above in the pattern shown above.  A: Migraine  Muscle spasm   P: Botox 100 units injected today.  Meds refilled as needed.  RTC 3 Months.

## 2019-06-20 NOTE — Patient Instructions (Signed)
Botulinum Toxin Cosmetic Injection Botulinum toxin injection is a shot that is given to soften lines and wrinkles in the face. The medicine works by weakening the muscles in the face. When muscles become weak, wrinkles are reduced. Tell a doctor about:  Any allergies you have. It is important to tell the doctor if you are allergic to eggs.  All the medicines you take. This includes: ? Antibiotics. ? Vitamins. ? Herbs. ? Eye drops. ? Creams. ? Over-the-counter medicines.  Any problems you or your family have had with the use of anesthetics.  Any blood problems you have.  Any surgeries you have had.  Any medical conditions you have.  If you are pregnant or may be pregnant.  If you are breastfeeding. What are the risks? Generally, this is a safe procedure, but there may be problems. Common problems include:  Pain.  Swelling.  Bruising. Other problems include:  Pain in the jaw.  Pain in the neck.  Allergic reaction to the shot.  Damage to nearby structures or organs.  Drooping eyebrow or eyelid.  Double vision.  Blurred vision.  Changes in voice or speech.  Inability to close one or both eyes.  Trouble swallowing.  Infection. What happens before the procedure?  Do not drink any alcohol as told by your doctor.  Ask your doctor about: ? Changing or stopping your regular medicines. This is important if you take diabetes medicines or blood thinners. ? Taking medicines such as aspirin and ibuprofen. These medicines can thin your blood. Do not take these medicines before the procedure unless your doctor tells you to take them. ? Taking over-the-counter medicines, vitamins, herbs, and supplements. What happens during the procedure?   The area to be treated may be: ? Chilled with ice. ? Numbed with medicine (local anesthetic).  Your doctor will give you some shots of the toxin. How many shots you will get depends on: ? Your condition. ? Which area is being  treated. The procedure may vary among doctors and hospitals. What can I expect after the procedure? After these shots, it is common to have:  Pain, soreness, swelling, itching, or redness on your face.  Small bruises from the needle.  Bumps or marks from the needle.  Loss of feeling in areas where the needle entered the body.  Headache. This is rare. It may take up to 14 days for the treatment to work. It may work sooner for some people. Results will last for about 3-4 months. Follow-up treatment will make this last longer. Follow these instructions at home: Relieving pain and discomfort   Take over-the-counter and prescription medicines only as told by your doctor.  If told, put ice to the injected area: ? Put ice in a plastic bag. ? Place a towel between your skin and the bag. ? Leave the ice on for 20 minutes, 2-3 times per day. Activity Do not do any activities that take a lot of effort. Avoid these activities for 2 hours after the procedure or for as long as told by your doctor. This includes:  Lifting heavy items.  Working out.  Doing activities that make your heart beat faster. General instructions  Do not lie down for 4 hours after treatment or as told by your doctor.  Do not rub the area where you got the shot. This can spread the toxin.  Depending on where the shot was given: ? Your doctor may ask you not to move your muscles in that area. Follow what   you are told. ? Your doctor may tell you to frown or squint regularly. You may need to do these exercises every 15 minutes for 1 hour after the shots. Follow what the doctor tells you. ? Do not get laser treatments, facials, or facial massages for 1-2 weeks after the procedure or for as long as told by your doctor. ? Keep all follow-up visits with your doctor. This is important. Contact a doctor if:  You have a headache that gets worse.  You have a fever. Get help right away if:  You have signs of an allergic  reaction. These include: ? An itchy rash or welts. ? Wheezing or shortness of breath. ? Dizziness.  Your eyelids start to get droopy or swollen.  You have trouble speaking.  You feel short of breath or have trouble breathing.  You start to have trouble swallowing. Summary  Botulinum toxin injection is a shot that is given to soften lines and wrinkles in the face.  This is a safe procedure. However, some problems may occur, including infection, drooping eyelid, double or blurred vision, and trouble swallowing.  Follow your doctor's instructions before the procedure. These may include stopping or changing medicines or stopping alcohol use.  You will be told how to care for yourself after the procedure.  Get help right away if you have chest pain, fever, or an allergic reaction. Also, get help right away if you have problems with vision, trouble speaking, or you feel short of breath, become hoarse, or have trouble swallowing. This information is not intended to replace advice given to you by your health care provider. Make sure you discuss any questions you have with your health care provider. Document Released: 12/30/2010 Document Revised: 07/18/2018 Document Reviewed: 07/18/2018 Elsevier Patient Education  2020 [employer]  

## 2019-06-25 MED FILL — CELECOXIB 200 MG CAP: 200 | 30 days supply | Qty: 60 | Fill #1

## 2019-06-27 MED FILL — traMADol HCL 50 MG TABS: 50 | 5 days supply | Qty: 20 | Fill #0

## 2019-07-01 ENCOUNTER — Ambulatory Visit
Admission: RE | Admit: 2019-07-01 | Discharge: 2019-07-01 | Disposition: A | Discharge status: Home / Self Care | Payer: 59 | Source: Ambulatory Visit | Attending: Vascular Surgery | Admitting: Vascular Surgery

## 2019-07-01 DIAGNOSIS — S71111A Laceration without foreign body, right thigh, initial encounter: Secondary | ICD-10-CM | POA: Diagnosis not present

## 2019-07-01 DIAGNOSIS — I83891 Varicose veins of right lower extremities with other complications: Secondary | ICD-10-CM

## 2019-07-01 MED ORDER — GADOBENATE DIMEGLUMINE 529 MG/ML IV SOLN
15.0000 mL | Freq: Once | INTRAVENOUS | Status: AC | PRN
  Administered 2019-07-01: 15 mL via INTRAVENOUS

## 2019-07-10 ENCOUNTER — Other Ambulatory Visit: Payer: Self-pay

## 2019-07-10 ENCOUNTER — Ambulatory Visit (INDEPENDENT_AMBULATORY_CARE_PROVIDER_SITE_OTHER): Payer: 59 | Admitting: Vascular Surgery

## 2019-07-10 ENCOUNTER — Encounter: Payer: Self-pay | Admitting: Vascular Surgery

## 2019-07-10 VITALS — BP 90/67 | HR 62 | Temp 97.7°F | Resp 20 | Ht 64.0 in | Wt 170.0 lb

## 2019-07-10 DIAGNOSIS — Q2730 Arteriovenous malformation, site unspecified: Secondary | ICD-10-CM | POA: Diagnosis not present

## 2019-07-10 NOTE — Progress Notes (Signed)
Patient is a 58 year old female who returns for follow-up today.  She recently had an MRI done of her right thigh to rule out AV malformation.  She had had a recent bleeding episode from a subcutaneous nodule on the anterior aspect of her right leg.  She reports that since she was last here the nodule has gotten much smaller.  She has had no further bleeding episodes.  She really has no significant varicosities in her lower extremity.  Physical exam:  Vitals:   07/10/19 1311  BP: 90/67  Pulse: 62  Resp: 20  Temp: 97.7 F (36.5 C)  TempSrc: Temporal  SpO2: 97%  Weight: 170 lb (77.1 kg)  Height: 5\' 4"  (1.626 m)    Right lower extremity: 2 mm area slightly reddish anterior thigh about 5 cm above the patella  Data: MRI of the thigh reveals no significant AV malformation  Assessment: Subcutaneous nodule with no further bleeding episodes too small to be detected on MRI I believe this is probably benign and should not cause her problems further.  However if she has further bleeding issues a wish to have this removed I told her that we could remove this with a fairly small operation.  She wishes to observe it for now.  Plan: The patient will follow-up with [place] on an as-needed basis.  Ruta Hinds, MD [facility] Office: [phone] Pager: [phone]

## 2019-07-22 ENCOUNTER — Other Ambulatory Visit: Payer: Self-pay | Admitting: Family Medicine

## 2019-07-22 DIAGNOSIS — G4700 Insomnia, unspecified: Secondary | ICD-10-CM

## 2019-07-22 MED FILL — HYDROXYZINE PAM 50 MG CAP: 50 | 10 days supply | Qty: 60 | Fill #4

## 2019-07-22 MED FILL — TOPIRAMATE 200 MG TABLET: 200 | 30 days supply | Qty: 30 | Fill #3

## 2019-07-23 MED FILL — traZODone HCL 50 MG TABS: 50 | 30 days supply | Qty: 30 | Fill #0

## 2019-08-12 MED FILL — CELECOXIB 200 MG CAP: 200 | 30 days supply | Qty: 60 | Fill #2

## 2019-08-12 MED FILL — BACLOFEN 10 MG TABS: 10 | 20 days supply | Qty: 60 | Fill #1

## 2019-08-12 MED FILL — NURTEC 75 MG TBDP: 75 | 30 days supply | Qty: 8 | Fill #1

## 2019-08-12 MED FILL — SERTRALINE HCL 100 MG TAB: 100 | 90 days supply | Qty: 135 | Fill #1

## 2019-08-12 MED FILL — EMGALITY 120 MG/ML SOAJ: 120 | 30 days supply | Qty: 1 | Fill #0

## 2019-08-25 MED FILL — ROSUVASTATIN CALCIUM 40 MG: 40 | 90 days supply | Qty: 90 | Fill #3

## 2019-08-25 MED FILL — PANTOPRAZOLE SOD DR 40 MG T: 40 | 90 days supply | Qty: 90 | Fill #2

## 2019-08-25 MED FILL — TOPIRAMATE 200 MG TABLET: 200 | 30 days supply | Qty: 30 | Fill #4

## 2019-08-29 ENCOUNTER — Other Ambulatory Visit: Payer: Self-pay | Admitting: Physician Assistant

## 2019-08-29 MED ORDER — PREDNISONE 20 MG PO TABS: 20.0000 mg | ORAL_TABLET | Freq: Every day | ORAL | 0 refills | Status: DC

## 2019-08-29 MED FILL — predniSONE 20 MG TABS: 20 | 10 days supply | Qty: 10 | Fill #0

## 2019-08-29 NOTE — Progress Notes (Unsigned)
Pt requested Pred taper.  Rx sent to pharm.

## 2019-09-09 ENCOUNTER — Other Ambulatory Visit: Payer: Self-pay | Admitting: Family Medicine

## 2019-09-09 DIAGNOSIS — G4700 Insomnia, unspecified: Secondary | ICD-10-CM

## 2019-09-23 ENCOUNTER — Telehealth: Payer: Self-pay | Admitting: *Deleted

## 2019-09-23 MED FILL — EMGALITY 120 MG/ML SOAJ: 120 | 30 days supply | Qty: 1 | Fill #1

## 2019-09-23 MED FILL — tiZANidine HCL 4 MG TABS: 4 | 30 days supply | Qty: 90 | Fill #0

## 2019-09-23 MED FILL — CELECOXIB 200 MG CAP: 200 | 30 days supply | Qty: 60 | Fill #3

## 2019-09-23 MED FILL — BACLOFEN 10 MG TABS: 10 | 20 days supply | Qty: 60 | Fill #2

## 2019-09-23 NOTE — Telephone Encounter (Signed)
Refilled tizanidine 4mg  #90 with one additional refill via fax.

## 2019-09-26 ENCOUNTER — Encounter: Payer: 59 | Admitting: Physician Assistant

## 2019-09-27 ENCOUNTER — Other Ambulatory Visit: Payer: Self-pay | Admitting: Physician Assistant

## 2019-10-01 ENCOUNTER — Encounter: Payer: Self-pay | Admitting: *Deleted

## 2019-10-02 MED FILL — AMLODIPINE BESYLATE 5 MG TA: 5 | 90 days supply | Qty: 90 | Fill #3

## 2019-10-02 MED FILL — TOPIRAMATE 200 MG TABLET: 200 | 30 days supply | Qty: 30 | Fill #5

## 2019-10-06 MED FILL — NARATRIPTAN HCL 2.5 MG TAB: 2.5 | 20 days supply | Qty: 10 | Fill #2

## 2019-10-07 ENCOUNTER — Other Ambulatory Visit: Payer: Self-pay | Admitting: *Deleted

## 2019-10-07 MED ORDER — BOTOX 100 UNITS IJ SOLR: 100.0000 [IU] | Freq: Once | INTRAMUSCULAR | 0 refills | Status: DC

## 2019-10-07 MED FILL — BOTOX 100 UNITS VIAL: 100 | 84 days supply | Qty: 1 | Fill #0

## 2019-10-10 ENCOUNTER — Other Ambulatory Visit: Payer: Self-pay

## 2019-10-10 ENCOUNTER — Ambulatory Visit (INDEPENDENT_AMBULATORY_CARE_PROVIDER_SITE_OTHER): Payer: 59 | Admitting: Physician Assistant

## 2019-10-10 ENCOUNTER — Encounter: Payer: Self-pay | Admitting: Physician Assistant

## 2019-10-10 VITALS — BP 92/62 | HR 74 | Wt 177.0 lb

## 2019-10-10 DIAGNOSIS — G43709 Chronic migraine without aura, not intractable, without status migrainosus: Secondary | ICD-10-CM

## 2019-10-10 DIAGNOSIS — G43009 Migraine without aura, not intractable, without status migrainosus: Secondary | ICD-10-CM

## 2019-10-10 DIAGNOSIS — IMO0002 Reserved for concepts with insufficient information to code with codable children: Secondary | ICD-10-CM

## 2019-10-10 DIAGNOSIS — G43011 Migraine without aura, intractable, with status migrainosus: Secondary | ICD-10-CM

## 2019-10-10 MED ORDER — TRAMADOL HCL 50 MG PO TABS: 50.0000 mg | ORAL_TABLET | Freq: Four times a day (QID) | ORAL | 0 refills | Status: DC | PRN

## 2019-10-10 MED ORDER — NARATRIPTAN HCL 2.5 MG PO TABS: ORAL_TABLET | ORAL | 11 refills | Status: DC

## 2019-10-10 MED ORDER — ONDANSETRON HCL 8 MG PO TABS: 8.0000 mg | ORAL_TABLET | Freq: Three times a day (TID) | ORAL | 3 refills | Status: DC | PRN

## 2019-10-10 MED ORDER — ONABOTULINUMTOXINA 100 UNITS IJ SOLR
100.0000 [IU] | Freq: Once | INTRAMUSCULAR | Status: AC
  Administered 2019-10-10: 100 [IU] via INTRAMUSCULAR

## 2019-10-10 MED FILL — ONDANSETRON HCL 8 MG TABLET: 8 | 6 days supply | Qty: 20 | Fill #0

## 2019-10-10 MED FILL — traMADol HCL 50 MG TABS: 50 | 5 days supply | Qty: 20 | Fill #0

## 2019-10-10 NOTE — Progress Notes (Signed)
S: Pt in office today for Botox injections. Her Botox has been working well for migraine prevention. She would like to review her medications for refills as well.  She recently got prescription for Nurtec for acute migraine.  She notes it works well, taking away her migraine within 2 hours and keeping the migraine away for at least one full day.  She is able to return to work and function normally within 2 hours of using Nurtec.  Her Migraine ACT score reveals 4 out of 4 affirmative answers today regarding Nurtec.   O: BP 92/62   Pulse 74   Wt 177 lb (80.3 kg)   BMI 30.38 kg/m    HIT6: 66 MACT: 4/4   Botox Procedure Note Vial of Botox was : Supplied by Patient Lot # [id number]  Expiration Date 07/2022  Botox Dosing by [facility] for Chronic Migraine   Injection Sites for Migraines  Botox 100 units was injected using the dosage in the table above in the pattern shown above.  A: Migraine  Muscle spasm   P: Botox 100 units injected today.  Medications refilled as requested RTC 3 Months.

## 2019-10-10 NOTE — Patient Instructions (Signed)

## 2019-10-14 ENCOUNTER — Encounter: Payer: Self-pay | Admitting: *Deleted

## 2019-10-14 MED FILL — NURTEC 75 MG TBDP: 75 | 30 days supply | Qty: 8 | Fill #2

## 2019-10-16 DIAGNOSIS — H5203 Hypermetropia, bilateral: Secondary | ICD-10-CM | POA: Diagnosis not present

## 2019-10-16 DIAGNOSIS — H52223 Regular astigmatism, bilateral: Secondary | ICD-10-CM | POA: Diagnosis not present

## 2019-10-16 DIAGNOSIS — Z135 Encounter for screening for eye and ear disorders: Secondary | ICD-10-CM | POA: Diagnosis not present

## 2019-10-16 DIAGNOSIS — H16223 Keratoconjunctivitis sicca, not specified as Sjogren's, bilateral: Secondary | ICD-10-CM | POA: Diagnosis not present

## 2019-10-16 DIAGNOSIS — H524 Presbyopia: Secondary | ICD-10-CM | POA: Diagnosis not present

## 2019-10-16 MED FILL — RESTASIS 0.05% EYE EMULSION: 0.05 | 30 days supply | Qty: 60 | Fill #0

## 2019-11-11 MED FILL — BACLOFEN 10 MG TABS: 10 | 20 days supply | Qty: 60 | Fill #3

## 2019-11-11 MED FILL — CELECOXIB 200 MG CAP: 200 | 30 days supply | Qty: 60 | Fill #4

## 2019-11-14 MED FILL — EMGALITY 120 MG/ML SOAJ: 120 | 30 days supply | Qty: 1 | Fill #2

## 2019-11-25 ENCOUNTER — Other Ambulatory Visit: Payer: Self-pay | Admitting: Family Medicine

## 2019-11-25 ENCOUNTER — Other Ambulatory Visit: Payer: Self-pay | Admitting: Physician Assistant

## 2019-11-25 DIAGNOSIS — F419 Anxiety disorder, unspecified: Secondary | ICD-10-CM

## 2019-11-25 DIAGNOSIS — G43809 Other migraine, not intractable, without status migrainosus: Secondary | ICD-10-CM

## 2019-11-25 MED FILL — HYDROXYZINE PAM 50 MG CAP: 50 | 10 days supply | Qty: 60 | Fill #0

## 2019-11-28 MED FILL — TOPIRAMATE 200 MG TABLET: 200 | 30 days supply | Qty: 30 | Fill #0

## 2019-12-02 MED FILL — PANTOPRAZOLE SOD DR 40 MG T: 40 | 90 days supply | Qty: 90 | Fill #3

## 2019-12-15 ENCOUNTER — Other Ambulatory Visit: Payer: Self-pay

## 2019-12-15 ENCOUNTER — Ambulatory Visit (INDEPENDENT_AMBULATORY_CARE_PROVIDER_SITE_OTHER): Payer: 59

## 2019-12-15 ENCOUNTER — Ambulatory Visit (INDEPENDENT_AMBULATORY_CARE_PROVIDER_SITE_OTHER): Payer: 59 | Admitting: Family Medicine

## 2019-12-15 ENCOUNTER — Encounter: Payer: Self-pay | Admitting: Family Medicine

## 2019-12-15 VITALS — BP 110/80 | HR 71 | Ht 64.0 in | Wt 181.0 lb

## 2019-12-15 DIAGNOSIS — M79645 Pain in left finger(s): Secondary | ICD-10-CM

## 2019-12-15 DIAGNOSIS — M79644 Pain in right finger(s): Secondary | ICD-10-CM | POA: Diagnosis not present

## 2019-12-15 DIAGNOSIS — M6530 Trigger finger, unspecified finger: Secondary | ICD-10-CM | POA: Diagnosis not present

## 2019-12-15 MED FILL — EMGALITY 120 MG/ML SOAJ: 120 | 30 days supply | Qty: 1 | Fill #3

## 2019-12-15 NOTE — Progress Notes (Signed)
I, Christoper Fabian, LAT, ATC, am serving as scribe for Dr. [name].  [name] is a 59 y.o. female who presents to [facility] today for L & R 3rd finger pain and catching. L third finger worse than the right. Has noticed that L finger is crooked. Does state that today she feels like it is about 50% better. Has been dealing with the pain for 6 months. Having a hard time opening jars, lift things, or make a fist. Before today pain 6/10 and today 5/10. Also experiencing bilateral hand numbness and tingling in the mornings. Is taking the celebrex, Ibuprofen and voltaren gel regularly. These will help temporarily with the pain.    ROS:  As above  Exam:  BP 110/80 (BP Location: Left Arm, Patient Position: Sitting, Cuff Size: Normal)   Pulse 71   Ht 5\' 4"  (1.626 m)   Wt 181 lb (82.1 kg)   SpO2 98%   BMI 31.07 kg/m  Wt Readings from Last 5 Encounters:  12/15/19 181 lb (82.1 kg)  10/10/19 177 lb (80.3 kg)  07/10/19 170 lb (77.1 kg)  06/05/19 175 lb (79.4 kg)  02/04/19 174 lb (78.9 kg)   General: Well Developed, well nourished, and in no acute distress.  Neuro/Psych: Alert and oriented x3, extra-ocular muscles intact, able to move all 4 extremities, sensation grossly intact. Skin: Warm and dry, no rashes noted.  Respiratory: Not using accessory muscles, speaking in full sentences, trachea midline.  Cardiovascular: Pulses palpable, no extremity edema. Abdomen: Does not appear distended. MSK: Left hand Nodular joint swelling at DIPs throughout left hand. No significant MCP swelling or effusion.  No ulnar deviation. Normal hand motion with the exception of flexion at third PIP which is slightly limited. Mildly tender palpation at palmar third MCP with triggering. Normal strength  Right hand: Again nodular joint swelling at DIPs throughout left hand. No significant MCP swelling or effusion no ulnar deviation. Hand motion normal with the exception  of flexion at third PIP slightly limited. Not particular tender palpation palmar MCP No triggering Normal strength.      Lab and Radiology Results  Procedure: Real-time Ultrasound Guided Injection of left third MCP A1 pulley tendon sheath Device: Philips Affiniti 50G Images permanently stored and available for review in the ultrasound unit. Verbal informed consent obtained.  Discussed risks and benefits of procedure. Warned about infection bleeding damage to structures skin hypopigmentation and fat atrophy among others. Patient expresses understanding and agreement Time-out conducted.   Noted no overlying erythema, induration, or other signs of local infection.   Skin prepped in a sterile fashion.   Local anesthesia: Topical Ethyl chloride.   With sterile technique and under real time ultrasound guidance:  1 mL of lidocaine and 40 mg of Kenalog injected easily.   Completed without difficulty   Pain immediately resolved suggesting accurate placement of the medication.   Advised to call if fevers/chills, erythema, induration, drainage, or persistent bleeding.   Images permanently stored and available for review in the ultrasound unit.  Impression: Technically successful ultrasound guided injection.         Assessment and Plan: 59 y.o. female with left third trigger finger.  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  Plan to treat with steroid injection left third A1 pulley as above.  Additionally will continue Voltaren gel and add double Band-Aid splint.  We will also treat contralateral right hand third PIP symptoms  with Voltaren gel and double Band-Aid splint.  Recheck back as needed for injection.  Return sooner if needed.  Precautions reviewed.    Orders Placed This Encounter  Procedures  . [place] - Upper Extremity - Limited - BILATERAL    Order Specific Question:   Reason for Exam (SYMPTOM  OR DIAGNOSIS REQUIRED)     Answer:   B finger pain    Order Specific Question:   Preferred imaging location?    Answer:   [facility]   No orders of the defined types were placed in this encounter.   Historical information moved to improve visibility of documentation.  Past Medical History:  Diagnosis Date  . Dizziness 03/05/2017  . Essential hypertension 03/05/2017  . Hyperlipidemia 03/05/2017  . Infertility, female   . Migraines    Past Surgical History:  Procedure Laterality Date  . ANKLE SURGERY     Left  . SHOULDER SURGERY     Left   Social History   Tobacco Use  . Smoking status: Former Smoker    Types: Cigarettes    Quit date: 02/09/1992    Years since quitting: 27.8  . Smokeless tobacco: Never Used  Substance Use Topics  . Alcohol use: Yes    Comment: 1-2 weekly   family history includes Alcohol abuse in her father and paternal grandfather; Allergic rhinitis in her mother; Cancer in her maternal grandmother; Eczema in her daughter; Heart disease in her maternal grandfather; Hypertension in her mother; Stroke in her father and another family member.  Medications: Current Outpatient Medications  Medication Sig Dispense Refill  . amLODipine (NORVASC) 5 MG tablet TAKE 1 TABLET BY MOUTH ONCE DAILY 90 tablet 3  . aspirin EC 81 MG tablet Take 81 mg by mouth daily.    . baclofen (LIORESAL) 10 MG tablet Take 1 tablet (10 mg total) by mouth every 8 (eight) hours as needed. 60 tablet 3  . celecoxib (CELEBREX) 200 MG capsule TAKE 1 CAPSULE BY MOUTH 2 TIMES DAILY. 60 capsule 11  . desonide (DESOWEN) 0.05 % ointment   0  . Fluticasone Propionate (XHANCE) 93 MCG/ACT EXHU Place 2 sprays into the nose 2 (two) times daily. 32 mL 5  . Galcanezumab-gnlm (EMGALITY) 120 MG/ML SOAJ Inject 240 mg into the skin as directed AND 120 mg every 30 (thirty) days. Inj 240mg  once then 120mg  monthly. 1 pen 11  . hydrOXYzine (ATARAX/VISTARIL) 50 MG tablet TAKE 2 TABLETS BY MOUTH 3 TIMES DAILY AS  NEEDED FOR ITCHING. 60 tablet 0  . hydrOXYzine (VISTARIL) 50 MG capsule TAKE 2 CAPSULES (100MG ) BY MOUTH THREE TIMES DAILY AS NEEDED FOR ITCHING 60 capsule 5  . ibuprofen (ADVIL,MOTRIN) 800 MG tablet Take 800 mg by mouth every 8 (eight) hours as needed.    . magnesium oxide (MAG-OX) 400 MG tablet Take 400 mg by mouth daily.    . naratriptan (AMERGE) 2.5 MG tablet TAKE 1 TABLET BY MOUTH AT ONSET OF HEADACHE. IF RETURNS OR DOES NOT RESOLVE MAY REPEAT AFTER 4 HOURS. DO NOT EXCEED 2 TABLETS IN 24 HOURS 10 tablet 11  . NONFORMULARY OR COMPOUNDED ITEM Gaba Calm as needed for anxiety    . ondansetron (ZOFRAN) 8 MG tablet Take 1 tablet (8 mg total) by mouth every 8 (eight) hours as needed. 20 tablet 3  . pantoprazole (PROTONIX) 40 MG tablet TAKE 1 TABLET (40 MG TOTAL) BY MOUTH DAILY. 90 tablet 3  . Pseudoephedrine-DM-GG (SUDAFED COUGH PO) Take by mouth.    Marland Kitchen  Rimegepant Sulfate (NURTEC) 75 MG TBDP Take 1 tablet by mouth as needed. 8 tablet 11  . rosuvastatin (CRESTOR) 40 MG tablet Take 1 tablet (40 mg total) by mouth daily. <PLEASE MAKE APPOINTMENT FOR REFILLS> 90 tablet 3  . sertraline (ZOLOFT) 100 MG tablet TAKE 1 1/2 TABLETS BY MOUTH DAILY. Needs ov before any more refills 135 tablet 1  . SUMAtriptan (IMITREX) 100 MG tablet Take 1 tablet (100 mg total) by mouth once as needed for migraine. 9 tablet 11  . tiZANidine (ZANAFLEX) 4 MG tablet Take 1 tablet (4 mg total) by mouth 3 (three) times daily. 90 tablet 2  . topiramate (TOPAMAX) 200 MG tablet TAKE 1 TABLET BY MOUTH ONCE DAILY 30 tablet 5  . traMADol (ULTRAM) 50 MG tablet Take 1 tablet (50 mg total) by mouth every 6 (six) hours as needed for severe pain. 20 tablet 0  . traZODone (DESYREL) 50 MG tablet TAKE 1/2 TO 1 TABLET BY MOUTH AT BEDTIME AS NEEDED FOR SLEEP (NEED OFFICE VISIT BEFORE ANY MORE REFILLS) 15 tablet 0  . vitamin B-12 (CYANOCOBALAMIN) 500 MCG tablet Take 500 mcg by mouth daily.     No current facility-administered medications for this  visit.   Allergies  Allergen Reactions  . Augmentin [Amoxicillin-Pot Clavulanate]     diarrhea      Discussed warning signs or symptoms. Please see discharge instructions. Patient expresses understanding.  The above documentation has been reviewed and is accurate and complete [name]

## 2019-12-15 NOTE — Patient Instructions (Signed)
Thank you for coming in today.  Call or go to the ER if you develop a large red swollen joint with extreme pain or oozing puss.  Continue the double bandaid splint.  Return sooner if needed.

## 2019-12-16 ENCOUNTER — Other Ambulatory Visit: Payer: Self-pay | Admitting: Physician Assistant

## 2019-12-16 MED FILL — RESTASIS 0.05% EYE EMULSION: 0.05 | 30 days supply | Qty: 60 | Fill #1

## 2019-12-18 ENCOUNTER — Telehealth: Payer: Self-pay

## 2019-12-18 MED FILL — BOTOX 100 UNITS VIAL: 100 | 1 days supply | Qty: 1 | Fill #0

## 2019-12-18 NOTE — Telephone Encounter (Signed)
Patient requested a refill but the refill did not come to the office under prescription. I have sent in a refill for patient to have her botox injection. She will have to be reschedule for a follow up an headache.

## 2019-12-19 ENCOUNTER — Encounter: Payer: 59 | Admitting: Physician Assistant

## 2020-01-08 ENCOUNTER — Other Ambulatory Visit: Payer: Self-pay | Admitting: Cardiovascular Disease

## 2020-01-08 ENCOUNTER — Other Ambulatory Visit: Payer: Self-pay | Admitting: Family Medicine

## 2020-01-08 ENCOUNTER — Other Ambulatory Visit: Payer: Self-pay | Admitting: Physician Assistant

## 2020-01-08 DIAGNOSIS — F419 Anxiety disorder, unspecified: Secondary | ICD-10-CM

## 2020-01-08 DIAGNOSIS — M62838 Other muscle spasm: Secondary | ICD-10-CM

## 2020-01-08 DIAGNOSIS — G43009 Migraine without aura, not intractable, without status migrainosus: Secondary | ICD-10-CM

## 2020-01-08 MED FILL — HYDROXYZINE PAM 50 MG CAP: 50 | 10 days supply | Qty: 60 | Fill #1

## 2020-01-08 MED FILL — TOPIRAMATE 200 MG TABLET: 200 | 30 days supply | Qty: 30 | Fill #1

## 2020-01-08 MED FILL — ONDANSETRON HCL 8 MG TABLET: 8 | 7 days supply | Qty: 20 | Fill #1

## 2020-01-08 MED FILL — CELECOXIB 200 MG CAP: 200 | 30 days supply | Qty: 60 | Fill #5

## 2020-01-08 MED FILL — ROSUVASTATIN CALCIUM 40 MG: 40 | 60 days supply | Qty: 60 | Fill #0

## 2020-01-08 MED FILL — tiZANidine HCL 4 MG TABS: 4 | 30 days supply | Qty: 90 | Fill #1

## 2020-01-08 MED FILL — AMLODIPINE BESYLATE 5 MG TA: 5 | 60 days supply | Qty: 60 | Fill #0

## 2020-01-09 ENCOUNTER — Ambulatory Visit (INDEPENDENT_AMBULATORY_CARE_PROVIDER_SITE_OTHER): Payer: 59 | Admitting: Physician Assistant

## 2020-01-09 ENCOUNTER — Encounter: Payer: Self-pay | Admitting: Physician Assistant

## 2020-01-09 ENCOUNTER — Other Ambulatory Visit: Payer: Self-pay | Admitting: Physician Assistant

## 2020-01-09 ENCOUNTER — Other Ambulatory Visit: Payer: Self-pay

## 2020-01-09 VITALS — BP 137/94 | HR 105

## 2020-01-09 DIAGNOSIS — G43001 Migraine without aura, not intractable, with status migrainosus: Secondary | ICD-10-CM

## 2020-01-09 DIAGNOSIS — M542 Cervicalgia: Secondary | ICD-10-CM

## 2020-01-09 DIAGNOSIS — M62838 Other muscle spasm: Secondary | ICD-10-CM

## 2020-01-09 DIAGNOSIS — G43009 Migraine without aura, not intractable, without status migrainosus: Secondary | ICD-10-CM

## 2020-01-09 DIAGNOSIS — IMO0002 Reserved for concepts with insufficient information to code with codable children: Secondary | ICD-10-CM

## 2020-01-09 DIAGNOSIS — G43809 Other migraine, not intractable, without status migrainosus: Secondary | ICD-10-CM

## 2020-01-09 DIAGNOSIS — G43709 Chronic migraine without aura, not intractable, without status migrainosus: Secondary | ICD-10-CM | POA: Diagnosis not present

## 2020-01-09 MED ORDER — KETOROLAC TROMETHAMINE 60 MG/2ML IM SOLN
60.0000 mg | Freq: Once | INTRAMUSCULAR | Status: AC
  Administered 2020-01-09: 60 mg via INTRAMUSCULAR

## 2020-01-09 MED ORDER — IBUPROFEN 800 MG PO TABS: 800.0000 mg | ORAL_TABLET | Freq: Three times a day (TID) | ORAL | 3 refills | Status: DC | PRN

## 2020-01-09 MED ORDER — ONDANSETRON HCL 8 MG PO TABS: 8.0000 mg | ORAL_TABLET | Freq: Three times a day (TID) | ORAL | 3 refills | Status: DC | PRN

## 2020-01-09 MED ORDER — UBRELVY 100 MG PO TABS: 100.0000 mg | ORAL_TABLET | ORAL | 11 refills | Status: DC | PRN

## 2020-01-09 MED ORDER — TRAMADOL HCL 50 MG PO TABS: 50.0000 mg | ORAL_TABLET | Freq: Four times a day (QID) | ORAL | 0 refills | Status: DC | PRN

## 2020-01-09 MED ORDER — TIZANIDINE HCL 4 MG PO TABS: 4.0000 mg | ORAL_TABLET | Freq: Three times a day (TID) | ORAL | 2 refills | Status: DC

## 2020-01-09 MED ORDER — ONABOTULINUMTOXINA 100 UNITS IJ SOLR
100.0000 [IU] | Freq: Once | INTRAMUSCULAR | Status: AC
  Administered 2020-01-09: 10:00:00 100 [IU] via INTRAMUSCULAR

## 2020-01-09 MED ORDER — TOPIRAMATE 200 MG PO TABS: 200.0000 mg | ORAL_TABLET | Freq: Every day | ORAL | 5 refills | Status: DC

## 2020-01-09 MED ORDER — BOTOX 100 UNITS IJ SOLR: 100.0000 [IU] | INTRAMUSCULAR | 3 refills | Status: DC

## 2020-01-09 MED FILL — UBRELVY 100 MG TABS: 100 | 8 days supply | Qty: 8 | Fill #0

## 2020-01-09 MED FILL — traMADol HCL 50 MG TABS: 50 | 5 days supply | Qty: 20 | Fill #0

## 2020-01-09 MED FILL — IBUPROFEN 800 MG TAB: 800 | 20 days supply | Qty: 60 | Fill #0

## 2020-01-09 MED FILL — BOTOX 100 UNITS VIAL: 100 | 90 days supply | Qty: 1 | Fill #0

## 2020-01-09 MED FILL — BACLOFEN 10 MG TABS: 10 | 20 days supply | Qty: 60 | Fill #0

## 2020-01-09 MED FILL — SERTRALINE HCL 100 MG TAB: 100 | 90 days supply | Qty: 135 | Fill #0

## 2020-01-09 NOTE — Progress Notes (Signed)
S: Pt in office today for Botox injections. Her Botox has been working very well for migraine prevention. Headaches were better until just over a week ago.  She received her 2nd dose of the covid vaccine last week and developed 10/10 migraine the following day.  She is still dealing with migraine and requests toradol today in addition to her botox.     O: BP (!) 137/94   Pulse (!) 105    Botox Procedure Note Vial of Botox was :/Supplied by Patient   Botox Dosing by [facility] for Chronic Migraine   Injection Sites for Migraines  Botox 100 units was injected using the dosage in the table above in the pattern shown above.  A: Migraine  Muscle spasm  Status Migraine  P: Botox 100 units injected today.  Meds refilled as needed.  Will trial ubrelvy in place of nurtec. Toradol 60mg  IM for status migraine.  RTC 3 Months.     troTromethamine given in right gluteal area.

## 2020-01-23 ENCOUNTER — Other Ambulatory Visit: Payer: Self-pay | Admitting: Physician Assistant

## 2020-01-23 MED ORDER — PREDNISONE 20 MG PO TABS: ORAL_TABLET | ORAL | 0 refills | Status: DC

## 2020-01-23 MED FILL — predniSONE 20 MG TABS: 20 | 4 days supply | Qty: 10 | Fill #0

## 2020-01-23 NOTE — Progress Notes (Signed)
Prednisone taper for persistent, intractable migraine.   Take 4 tabs (80mg ) today.  Take 3 tabs on day 2 Take 2 tabs on day 3 and Take 1 tab on the 4th and final day

## 2020-02-02 ENCOUNTER — Encounter: Payer: Self-pay | Admitting: *Deleted

## 2020-02-05 ENCOUNTER — Telehealth: Payer: Self-pay | Admitting: Emergency Medicine

## 2020-02-05 MED FILL — FLUCONAZOLE 150 MG TABS: 150 | 3 days supply | Qty: 3 | Fill #0

## 2020-02-06 MED FILL — TOPIRAMATE 200 MG TABLET: 200 | 30 days supply | Qty: 30 | Fill #2

## 2020-02-19 ENCOUNTER — Telehealth: Payer: Self-pay | Admitting: Radiology

## 2020-02-19 NOTE — Telephone Encounter (Signed)
Left message for [name] to call the office to schedule Botox in April with Nada Maclachlan

## 2020-02-24 ENCOUNTER — Other Ambulatory Visit: Payer: Self-pay

## 2020-02-24 ENCOUNTER — Encounter: Payer: Self-pay | Admitting: Family Medicine

## 2020-02-24 ENCOUNTER — Ambulatory Visit: Payer: Self-pay

## 2020-02-24 ENCOUNTER — Ambulatory Visit (INDEPENDENT_AMBULATORY_CARE_PROVIDER_SITE_OTHER): Payer: 59

## 2020-02-24 ENCOUNTER — Ambulatory Visit: Payer: 59

## 2020-02-24 ENCOUNTER — Ambulatory Visit (INDEPENDENT_AMBULATORY_CARE_PROVIDER_SITE_OTHER): Payer: 59 | Admitting: Family Medicine

## 2020-02-24 VITALS — BP 112/72 | HR 92 | Ht 64.0 in | Wt 178.2 lb

## 2020-02-24 DIAGNOSIS — M25561 Pain in right knee: Secondary | ICD-10-CM | POA: Diagnosis not present

## 2020-02-24 DIAGNOSIS — Z23 Encounter for immunization: Secondary | ICD-10-CM | POA: Diagnosis not present

## 2020-02-24 DIAGNOSIS — S01112A Laceration without foreign body of left eyelid and periocular area, initial encounter: Secondary | ICD-10-CM

## 2020-02-24 NOTE — Patient Instructions (Signed)
Thank you for coming in today. Get xray today.  If the locking continues let me know.  We can look into this more for Meniscus injury.  Knee sleeve or a hinged knee brace will help.    Meniscus Tear  A meniscus tear is a knee injury that happens when a piece of the meniscus is torn. The meniscus is a thick, rubbery, wedge-shaped cartilage in the knee. Two menisci are located in each knee. They sit between the upper bone (femur) and lower bone (tibia) that make up the knee joint. Each meniscus acts as a shock absorber for the knee. A torn meniscus is one of the most common types of knee injuries. This injury can range from mild to severe. Surgery may be needed to repair a severe tear. What are the causes? This condition may be caused by any kneeling, squatting, twisting, or pivoting movement. Sports-related injuries are the most common cause. These often occur from:  Running and stopping suddenly. ? Changing direction. ? Being tackled or knocked off your feet.  Lifting or carrying heavy weights. As people get older, their menisci get thinner and weaker. In these people, tears can happen more easily, such as from climbing stairs. What increases the risk? You are more likely to develop this condition if you:  Play contact sports.  Have a job that requires kneeling or squatting.  Are female.  Are over 30 years old. What are the signs or symptoms? Symptoms of this condition include:  Knee pain, especially at the side of the knee joint. You may feel pain when the injury occurs, or you may only hear a pop and feel pain later.  A feeling that your knee is clicking, catching, locking, or giving way.  Not being able to fully bend or extend your knee.  Bruising or swelling in your knee. How is this diagnosed? This condition may be diagnosed based on your symptoms and a physical exam. You may also have tests, such as:  X-rays.  MRI.  A procedure to look inside your knee with a  narrow surgical telescope (arthroscopy). You may be referred to a knee specialist (orthopedic surgeon). How is this treated? Treatment for this injury depends on the severity of the tear. Treatment for a mild tear may include:  Rest.  Medicine to reduce pain and swelling. This is usually a nonsteroidal anti-inflammatory drug (NSAID), like ibuprofen.  A knee brace, sleeve, or wrap.  Using crutches or a walker to keep weight off your knee and to help you walk.  Exercises to strengthen your knee (physical therapy). You may need surgery if you have a severe tear or if other treatments are not working. Follow these instructions at home: If you have a brace, sleeve, or wrap:  Wear it as told by your health care provider. Remove it only as told by your health care provider.  Loosen the brace, sleeve, or wrap if your toes tingle, become numb, or turn cold and blue.  Keep the brace, sleeve, or wrap clean and dry.  If the brace, sleeve, or wrap is not waterproof: ? Do not let it get wet. ? Cover it with a watertight covering when you take a bath or shower. Managing pain and swelling   Take over-the-counter and prescription medicines only as told by your health care provider.  If directed, put ice on your knee: ? If you have a removable brace, sleeve, or wrap, remove it as told by your health care provider. ? Put ice  in a plastic bag. ? Place a towel between your skin and the bag. ? Leave the ice on for 20 minutes, 2-3 times per day.  Move your toes often to avoid stiffness and to lessen swelling.  Raise (elevate) the injured area above the level of your heart while you are sitting or lying down. Activity  Do not use the injured limb to support your body weight until your health care provider says that you can. Use crutches or a walker as told by your health care provider.  Return to your normal activities as told by your health care provider. Ask your health care provider what  activities are safe for you.  Perform range-of-motion exercises only as told by your health care provider.  Begin doing exercises to strengthen your knee and leg muscles only as told by your health care provider. After you recover, your health care provider may recommend these exercises to help prevent another injury. General instructions  Use a knee brace, sleeve, or wrap as told by your health care provider.  Ask your health care provider when it is safe to drive if you have a brace, sleeve, or wrap on your knee.  Do not use any products that contain nicotine or tobacco, such as cigarettes, e-cigarettes, and chewing tobacco. If you need help quitting, ask your health care provider.  Ask your health care provider if the medicine prescribed to you: ? Requires you to avoid driving or using heavy machinery. ? Can cause constipation. You may need to take these actions to prevent or treat constipation:  Drink enough fluid to keep your urine pale yellow.  Take over-the-counter or prescription medicines.  Eat foods that are high in fiber, such as beans, whole grains, and fresh fruits and vegetables.  Limit foods that are high in fat and processed sugars, such as fried or sweet foods.  Keep all follow-up visits as told by your health care provider. This is important. Contact a health care provider if:  You have a fever.  Your knee becomes red, tender, or swollen.  Your pain medicine is not helping.  Your symptoms get worse or do not improve after 2 weeks of home care. Summary  A meniscus tear is a knee injury that happens when a piece of the meniscus is torn.  Treatment for this injury depends on the severity of the tear. You may need surgery if you have a severe tear or if other treatments are not working.  Rest, ice, and raise (elevate) your injured knee as told by your health care provider. This will help lessen pain and swelling.  Contact a health care provider if you have new  symptoms, or your symptoms get worse or do not improve after 2 weeks of home care.  Keep all follow-up visits as told by your health care provider. This is important. This information is not intended to replace advice given to you by your health care provider. Make sure you discuss any questions you have with your health care provider. Document Revised: 06/11/2018 Document Reviewed: 06/11/2018 Elsevier Patient Education  [place].

## 2020-02-24 NOTE — Progress Notes (Signed)
I, Wendy Poet, LAT, ATC, am serving as scribe for Dr. Lynne Leader.  [name] is a 59 y.o. female who presents to [facility] today for R knee pain.  She was walking on a moving sidewalk [place] at the airport when travelling back from [place] and her R knee gave way and she fell.  She locates her R knee pain to the R medial and lateral knee.  She also reports R lateral hip pain, has a bruised L eye, an laceration left eyebrow and a L forearm abrasion.  She rates her pain at a 6/10 and describes her pain aching. She described occasional locking with knee motion.  Radiating pain: No R knee swelling: Yes R knee mechanical symptoms: Yes, R knee is locking Aggravating factors: Walking, transitioning from sit-to-stand; walking upstairs Treatments tried: Compression wrap; ice; elevation; IBU 800mg ; Zanaflex   Pertinent review of systems: No fevers or chills  Relevant historical information: Patient thinks Tdap was about 10 years ago.   Exam:  BP 112/72 (BP Location: Right Arm, Patient Position: Sitting, Cuff Size: Normal)   Pulse 92   Ht 5\' 4"  (1.626 m)   Wt 178 lb 3.2 oz (80.8 kg)   SpO2 98%   BMI 30.59 kg/m  General: Well Developed, well nourished, and in no acute distress.  HEENT: Contusion and linear laceration left eyebrow. Wound edges are approximated. MSK: Right knee: Mild effusion no erythema. Mildly tender palpation anterior knee. Nontender along bony prominences femoral condyles or tibia. Range of motion 5-100. Stable ligamentous exam. Intact flexion extension strength.  Patient has a mild antalgic gait but is able to ambulate.    Lab and Radiology Results  X-ray images right knee personally independently reviewed  X-ray right knee show mild degenerative changes. Loose body present middle portion of anterior knee. No acute fractures visible. Await formal radiology review  Diagnostic Limited MSK Ultrasound of: Right knee Quad  tendon intact without tear. Mild effusion. Patellar tendon intact without tear. Medial and lateral joint lines narrowed mildly. Probable meniscus tears present. No Baker's cyst. Impression: DJD mild effusion  Procedure: Real-time Ultrasound Guided Injection of right knee Device: Philips Affiniti 50G Images permanently stored and available for review in the ultrasound unit. Verbal informed consent obtained.  Discussed risks and benefits of procedure. Warned about infection bleeding damage to structures skin hypopigmentation and fat atrophy among others. Patient expresses understanding and agreement Time-out conducted.   Noted no overlying erythema, induration, or other signs of local infection.   Skin prepped in a sterile fashion.   Local anesthesia: Topical Ethyl chloride.   With sterile technique and under real time ultrasound guidance:  40 mg of Kenalog and 3 mL of Marcaine injected easily.   Completed without difficulty   Pain partially resolved suggesting accurate placement of the medication.   Advised to call if fevers/chills, erythema, induration, drainage, or persistent bleeding.   Images permanently stored and available for review in the ultrasound unit.  Impression: Technically successful ultrasound guided injection.     Left elbow laceration protected with Steri-Strip.  Tdap given prior to discharge   Assessment and Plan: 59 y.o. female with  Right knee pain after fall. Patient describes mechanical locking and catching symptoms that possibly preceded the fall. X-ray today does show possible loose body in the knee but try interpretation does not show acute fractures. Await formal radiology review. Plan for injection Voltaren gel hinged knee brace. Await radiology review however will proceed with  MRI if not improving.  Left eye brow laceration. Wound edges were already approximated however we protected it a bit with a Steri-Strip.  Additionally Tdap given today.   PDMP  not reviewed this encounter. Orders Placed This Encounter  Procedures  . DG Knee AP/LAT W/Sunrise Right    Standing Status:   Future    Number of Occurrences:   1    Standing Expiration Date:   04/25/2021    Order Specific Question:   Reason for Exam (SYMPTOM  OR DIAGNOSIS REQUIRED)    Answer:   R knee pain    Order Specific Question:   Is patient pregnant?    Answer:   No    Order Specific Question:   Preferred imaging location?    Answer:   [facility]  . [place] LIMITED JOINT SPACE STRUCTURES LOW RIGHT(NO LINKED CHARGES)    Order Specific Question:   Reason for Exam (SYMPTOM  OR DIAGNOSIS REQUIRED)    Answer:   r knee pain    Order Specific Question:   Preferred imaging location?    Answer:   [facility]  . DG Knee 4 Views W/Patella Right    Standing Status:   Future    Number of Occurrences:   1    Standing Expiration Date:   04/25/2021    Order Specific Question:   Reason for Exam (SYMPTOM  OR DIAGNOSIS REQUIRED)    Answer:   eval rigt knee pain after fall    Order Specific Question:   Is patient pregnant?    Answer:   No    Order Specific Question:   Preferred imaging location?    Answer:   [facility]    Order Specific Question:   Radiology Contrast Protocol - do NOT remove file path    Answer:   \\[email or web address]\DXFluoroContrastProtocols.pdf  . Tdap vaccine greater than or equal to 7yo IM   No orders of the defined types were placed in this encounter.    Discussed warning signs or symptoms. Please see discharge instructions. Patient expresses understanding.   The above documentation has been reviewed and is accurate and complete [name]

## 2020-02-25 NOTE — Progress Notes (Signed)
X-ray knee shows no fracture.  Does show arthritis.  I am concerned for a loose body in the knee potentially making her knee catch.  We can see this on x-ray.  If not improved will proceed with MRI for surgical planning to remove the loose body causing the knee catching or giving way.

## 2020-02-26 ENCOUNTER — Encounter: Payer: Self-pay | Admitting: Family Medicine

## 2020-02-26 DIAGNOSIS — M25561 Pain in right knee: Secondary | ICD-10-CM

## 2020-02-26 DIAGNOSIS — M2341 Loose body in knee, right knee: Secondary | ICD-10-CM

## 2020-02-26 DIAGNOSIS — M2391 Unspecified internal derangement of right knee: Secondary | ICD-10-CM

## 2020-02-26 MED ORDER — LORAZEPAM 0.5 MG PO TABS: ORAL_TABLET | ORAL | 0 refills | Status: DC

## 2020-02-26 MED FILL — LORazepam 0.5 MG TABS: 0.5 | 2 days supply | Qty: 4 | Fill #0

## 2020-02-26 NOTE — Addendum Note (Signed)
Addended by: Rodolph Bong on: 02/26/2020 09:33 AM   Modules accepted: Orders

## 2020-02-27 MED FILL — BACLOFEN 10 MG TABS: 10 | 20 days supply | Qty: 60 | Fill #1

## 2020-03-01 ENCOUNTER — Ambulatory Visit (INDEPENDENT_AMBULATORY_CARE_PROVIDER_SITE_OTHER): Payer: 59

## 2020-03-01 ENCOUNTER — Encounter: Payer: Self-pay | Admitting: Family Medicine

## 2020-03-01 ENCOUNTER — Other Ambulatory Visit: Payer: Self-pay

## 2020-03-01 DIAGNOSIS — M2341 Loose body in knee, right knee: Secondary | ICD-10-CM

## 2020-03-01 DIAGNOSIS — M2391 Unspecified internal derangement of right knee: Secondary | ICD-10-CM

## 2020-03-01 DIAGNOSIS — S83241A Other tear of medial meniscus, current injury, right knee, initial encounter: Secondary | ICD-10-CM | POA: Diagnosis not present

## 2020-03-01 DIAGNOSIS — M25561 Pain in right knee: Secondary | ICD-10-CM | POA: Diagnosis not present

## 2020-03-01 DIAGNOSIS — S83231A Complex tear of medial meniscus, current injury, right knee, initial encounter: Secondary | ICD-10-CM

## 2020-03-01 NOTE — Progress Notes (Signed)
MRI knee shows medium arthritis.  It also shows a medial meniscus tear that probably explains your mechanical symptoms.  If mechanical symptoms persist and not better with injection would recommend surgical referral.  Would you like me to do that now?

## 2020-03-03 ENCOUNTER — Other Ambulatory Visit: Payer: Self-pay

## 2020-03-03 ENCOUNTER — Ambulatory Visit (INDEPENDENT_AMBULATORY_CARE_PROVIDER_SITE_OTHER): Payer: 59 | Admitting: Orthopedic Surgery

## 2020-03-03 ENCOUNTER — Encounter: Payer: Self-pay | Admitting: Orthopedic Surgery

## 2020-03-03 DIAGNOSIS — M25461 Effusion, right knee: Secondary | ICD-10-CM | POA: Diagnosis not present

## 2020-03-04 ENCOUNTER — Other Ambulatory Visit: Payer: Self-pay | Admitting: Family Medicine

## 2020-03-04 ENCOUNTER — Encounter: Payer: Self-pay | Admitting: Orthopedic Surgery

## 2020-03-04 DIAGNOSIS — K219 Gastro-esophageal reflux disease without esophagitis: Secondary | ICD-10-CM

## 2020-03-04 MED FILL — PANTOPRAZOLE SOD DR 40 MG T: 40 | 90 days supply | Qty: 90 | Fill #0

## 2020-03-04 MED FILL — CELECOXIB 200 MG CAP: 200 | 30 days supply | Qty: 60 | Fill #6

## 2020-03-04 NOTE — Progress Notes (Signed)
Office Visit Note   Patient: [name]           Date of Birth: 1961-06-20           MRN: [id number] Visit Date: 03/03/2020 Requested by: Rodolph Bong, MD [address] [place],  [place] [address] PCP: Zola Button, Grayling Congress, DO  Subjective: Chief Complaint  Patient presents with  . Right Knee - Pain    HPI: [name] is a patient with right knee pain.  She [place] her knee at the airport [place] on her left side.  She has had multiple episodes of locking of the right knee since that time.  She describes definite locking episodes where her knee gets stuck in a position close to extension and then she has to unlock it and the pain is relieved.  This is causing her to fall.  She has to walk essentially with her knee straight or her knee will lock up.  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  The pain is minimal at rest.  She has been using a hinged knee brace.  She has had an injection and an MRI scan which is reviewed with her today.              ROS: All systems reviewed are negative as they relate to the chief complaint within the history of present illness.  Patient denies  fevers or chills.   Assessment & Plan: Visit Diagnoses:  1. Effusion, right knee     Plan: Impression is right knee locking following an injury at the airport 8 days ago.  She does have an effusion.  Undersurface tear of the medial meniscus is described in the report but it is pretty underwhelming on inspection of the coronal and sagittal views.  I think she may have fairly thickened transverse meniscal ligament with possible loose body in that region versus loose body in the medial compartment seen in the area of chondral edema around the femur and tibia.  Plan at this time is activity modification for the next 2 to 3 days followed by attempt to return to regular activity including regular walking.  If her mechanical symptoms continue I think arthroscopy is indicated for exploration  and debridement/removal of offending tissue.  I will see her back in 7 days for clinical recheck and decision for or against surgery at that time.  Follow-Up Instructions: Return in about 1 week (around 03/10/2020).   Orders:  No orders of the defined types were placed in this encounter.  No orders of the defined types were placed in this encounter.     Procedures: No procedures performed   Clinical Data: No additional findings.  Objective: Vital Signs: There were no vitals taken for this visit.  Physical Exam:   Constitutional: Patient appears well-developed HEENT:  Head: Normocephalic Eyes:EOM are normal Neck: Normal range of motion Cardiovascular: Normal rate Pulmonary/chest: Effort normal Neurologic: Patient is alert Skin: Skin is warm Psychiatric: Patient has normal mood and affect    Ortho Exam: Ortho exam demonstrates slightly antalgic gait to the right.  Mild effusion is present.  Collateral and cruciate ligaments are stable.  She has medial greater than lateral joint line tenderness.  McMurray compression testing equivocal.  Extensor mechanism intact and nontender.  No groin pain with internal extra rotation of the leg.  Specialty Comments:  No specialty comments available.  Imaging: No results found.   PMFS History: Patient Active Problem  List   Diagnosis Date Noted  . Trigger finger, acquired 12/15/2019  . Chronic migraine 07/05/2018  . Stress at home 11/09/2017  . Essential hypertension 03/05/2017  . Dizziness 03/05/2017  . Hyperlipidemia 03/05/2017  . Plantar fasciitis of right foot 02/02/2016  . Chronic rhinitis 10/20/2015  . Cough, persistent 10/20/2015  . Neck pain 03/16/2015  . Overweight 01/22/2015  . Anxiety 07/14/2014  . Insomnia 05/28/2012  . Migraine 05/28/2012  . Muscle spasm 05/28/2012  . HEADACHE 04/07/2009   Past Medical History:  Diagnosis Date  . Dizziness 03/05/2017  . Essential hypertension 03/05/2017  . Hyperlipidemia  03/05/2017  . Infertility, female   . Migraines     Family History  Problem Relation Age of Onset  . Hypertension Mother   . Allergic rhinitis Mother   . Cancer Maternal Grandmother   . Stroke Other   . Eczema Daughter   . Alcohol abuse Father   . Stroke Father   . Heart disease Maternal Grandfather   . Alcohol abuse Paternal Grandfather     Past Surgical History:  Procedure Laterality Date  . ANKLE SURGERY     Left  . SHOULDER SURGERY     Left   Social History   Occupational History    Employer: [employer]    Comment: [employer]  Tobacco Use  . Smoking status: Former Smoker    Types: Cigarettes    Quit date: 02/09/1992    Years since quitting: 28.0  . Smokeless tobacco: Never Used  Substance and Sexual Activity  . Alcohol use: Yes    Comment: 1-2 weekly  . Drug use: No  . Sexual activity: Yes    Partners: Male

## 2020-03-10 ENCOUNTER — Other Ambulatory Visit: Payer: Self-pay

## 2020-03-10 ENCOUNTER — Encounter: Payer: Self-pay | Admitting: Family Medicine

## 2020-03-10 ENCOUNTER — Ambulatory Visit (INDEPENDENT_AMBULATORY_CARE_PROVIDER_SITE_OTHER): Payer: 59 | Admitting: Orthopedic Surgery

## 2020-03-10 ENCOUNTER — Encounter: Payer: Self-pay | Admitting: Orthopedic Surgery

## 2020-03-10 VITALS — Ht 64.0 in | Wt 178.0 lb

## 2020-03-10 DIAGNOSIS — M25461 Effusion, right knee: Secondary | ICD-10-CM | POA: Diagnosis not present

## 2020-03-10 NOTE — Progress Notes (Signed)
Office Visit Note   Patient: [name]           Date of Birth: 1961-06-05           MRN: [id number] Visit Date: 03/10/2020 Requested by: [address], [place], [place] [address] [place],  [place] [address] PCP: Zola Button, Grayling Congress, DO  Subjective: Chief Complaint  Patient presents with  . Right Knee - Pain, Follow-up    HPI: [name] is a patient with right knee pain.  We saw her a week ago.  She is having definite mechanical symptoms but the MRI scan of her knee showed fairly unimpressive stable appearing minimal degenerative tear of the meniscus.  I thought there may be a chondral problem on the medial femoral condyle which was not commented upon but that was only seen in one of the sequences.  Bottom line is there is nothing definitive in the MRI scan to account for her mechanical symptoms.  Overall she has been improving.  She reports less locking up of the knee and when the knee does "lock up" it is less severe and debilitating than it was before last week.  She is doing the bike and exercising daily.  She is using the brace.  She is walking normally in the house but with some catching.              ROS: All systems reviewed are negative as they relate to the chief complaint within the history of present illness.  Patient denies  fevers or chills.   Assessment & Plan: Visit Diagnoses:  1. Effusion, right knee     Plan: Impression is improved right knee pain with no effusion today and diminishing mechanical symptoms.  We discussed at length operative and nonoperative treatment options.  In general because she is improving we are going to watch this for now.  I do not think there is anything in the knee that is going to be destructive or debilitating for the knee if it is not explored and removed.  For now we are going to base our surgical decision making on clinical symptoms which at this time are improving.  Should that change I think Mandy and I both have a low  threshold for arthroscopic evaluation of the joint.  She is going to call and set that up if her symptoms do change.  Follow-Up Instructions: Return if symptoms worsen or fail to improve.   Orders:  No orders of the defined types were placed in this encounter.  No orders of the defined types were placed in this encounter.     Procedures: No procedures performed   Clinical Data: No additional findings.  Objective: Vital Signs: Ht 5\' 4"  (1.626 m)   Wt 178 lb (80.7 kg)   BMI 30.55 kg/m   Physical Exam:   Constitutional: Patient appears well-developed HEENT:  Head: Normocephalic Eyes:EOM are normal Neck: Normal range of motion Cardiovascular: Normal rate Pulmonary/chest: Effort normal Neurologic: Patient is alert Skin: Skin is warm Psychiatric: Patient has normal mood and affect    Ortho Exam: Ortho exam demonstrates full range of motion of the right knee with no real focal joint line tenderness.  No effusion today.  Range of motion is good with no significant patellofemoral crepitus.  Extensor mechanism is intact and nontender.  Specialty Comments:  No specialty comments available.  Imaging: No results found.   PMFS History: Patient Active Problem List   Diagnosis Date Noted  .  Trigger finger, acquired 12/15/2019  . Chronic migraine 07/05/2018  . Stress at home 11/09/2017  . Essential hypertension 03/05/2017  . Dizziness 03/05/2017  . Hyperlipidemia 03/05/2017  . Plantar fasciitis of right foot 02/02/2016  . Chronic rhinitis 10/20/2015  . Cough, persistent 10/20/2015  . Neck pain 03/16/2015  . Overweight 01/22/2015  . Anxiety 07/14/2014  . Insomnia 05/28/2012  . Migraine 05/28/2012  . Muscle spasm 05/28/2012  . HEADACHE 04/07/2009   Past Medical History:  Diagnosis Date  . Dizziness 03/05/2017  . Essential hypertension 03/05/2017  . Hyperlipidemia 03/05/2017  . Infertility, female   . Migraines     Family History  Problem Relation Age of Onset    . Hypertension Mother   . Allergic rhinitis Mother   . Cancer Maternal Grandmother   . Stroke Other   . Eczema Daughter   . Alcohol abuse Father   . Stroke Father   . Heart disease Maternal Grandfather   . Alcohol abuse Paternal Grandfather     Past Surgical History:  Procedure Laterality Date  . ANKLE SURGERY     Left  . SHOULDER SURGERY     Left   Social History   Occupational History    Employer: [employer]    Comment: [employer]  Tobacco Use  . Smoking status: Former Smoker    Types: Cigarettes    Quit date: 02/09/1992    Years since quitting: 28.1  . Smokeless tobacco: Never Used  Substance and Sexual Activity  . Alcohol use: Yes    Comment: 1-2 weekly  . Drug use: No  . Sexual activity: Yes    Partners: Male

## 2020-03-24 ENCOUNTER — Other Ambulatory Visit: Payer: Self-pay | Admitting: Cardiovascular Disease

## 2020-03-24 ENCOUNTER — Other Ambulatory Visit: Payer: Self-pay

## 2020-03-24 ENCOUNTER — Telehealth (INDEPENDENT_AMBULATORY_CARE_PROVIDER_SITE_OTHER): Payer: 59 | Admitting: Cardiovascular Disease

## 2020-03-24 ENCOUNTER — Encounter: Payer: Self-pay | Admitting: Cardiovascular Disease

## 2020-03-24 VITALS — BP 156/84 | HR 65 | Ht 64.0 in | Wt 175.0 lb

## 2020-03-24 DIAGNOSIS — E785 Hyperlipidemia, unspecified: Secondary | ICD-10-CM

## 2020-03-24 DIAGNOSIS — I10 Essential (primary) hypertension: Secondary | ICD-10-CM

## 2020-03-24 DIAGNOSIS — Z5181 Encounter for therapeutic drug level monitoring: Secondary | ICD-10-CM | POA: Diagnosis not present

## 2020-03-24 LAB — COMPREHENSIVE METABOLIC PANEL
ALT: 35 IU/L — ABNORMAL HIGH (ref 0–32)
AST: 26 IU/L (ref 0–40)
Albumin/Globulin Ratio: 2.2 (ref 1.2–2.2)
Albumin: 4.9 g/dL (ref 3.8–4.9)
Alkaline Phosphatase: 90 IU/L (ref 39–117)
BUN/Creatinine Ratio: 26 (ref 12–28)
BUN: 19 mg/dL (ref 8–27)
Bilirubin Total: 0.3 mg/dL (ref 0.0–1.2)
CO2: 22 mmol/L (ref 20–29)
Calcium: 9.5 mg/dL (ref 8.7–10.3)
Chloride: 105 mmol/L (ref 96–106)
Creatinine, Ser: 0.72 mg/dL (ref 0.57–1.00)
GFR calc Af Amer: 105 mL/min/{1.73_m2} (ref >59)
GFR calc non Af Amer: 91 mL/min/{1.73_m2} (ref >59)
Globulin, Total: 2.2 g/dL (ref 1.5–4.5)
Glucose: 87 mg/dL (ref 65–99)
Potassium: 4.1 mmol/L (ref 3.5–5.2)
Sodium: 142 mmol/L (ref 134–144)
Total Protein: 7.1 g/dL (ref 6.0–8.5)

## 2020-03-24 LAB — LIPID PANEL
Chol/HDL Ratio: 2.5 ratio (ref 0.0–4.4)
Cholesterol, Total: 189 mg/dL (ref 100–199)
HDL: 75 mg/dL (ref >39)
LDL Chol Calc (NIH): 82 mg/dL (ref 0–99)
Triglycerides: 193 mg/dL — ABNORMAL HIGH (ref 0–149)
VLDL Cholesterol Cal: 32 mg/dL (ref 5–40)

## 2020-03-24 MED ORDER — AMLODIPINE BESYLATE 5 MG PO TABS: 5.0000 mg | ORAL_TABLET | Freq: Every day | ORAL | 3 refills | Status: DC

## 2020-03-24 MED ORDER — ROSUVASTATIN CALCIUM 40 MG PO TABS: 40.0000 mg | ORAL_TABLET | Freq: Every day | ORAL | 3 refills | Status: DC

## 2020-03-24 MED FILL — ROSUVASTATIN CALCIUM 40 MG: 40 | 90 days supply | Qty: 90 | Fill #0

## 2020-03-24 MED FILL — AMLODIPINE BESYLATE 5 MG TA: 5 | 90 days supply | Qty: 90 | Fill #0

## 2020-03-24 NOTE — Patient Instructions (Addendum)
Medication Instructions:  Your physician recommends that you continue on your current medications as directed. Please refer to the Current Medication list given to you today.  *If you need a refill on your cardiac medications before your next appointment, please call your pharmacy*  Lab Work: LP/CMET TODAY   If you have labs (blood work) drawn today and your tests are completely normal, you will receive your results only by: Marland Kitchen MyChart Message (if you have MyChart) OR . A paper copy in the mail If you have any lab test that is abnormal or we need to change your treatment, we will call you to review the results.  Testing/Procedures: NONE  Follow-Up: At [facility], you and your health needs are our priority.  As part of our continuing mission to provide you with exceptional heart care, we have created designated Provider Care Teams.  These Care Teams include your primary Cardiologist (physician) and Advanced Practice Providers (APPs -  Physician Assistants and Nurse Practitioners) who all work together to provide you with the care you need, when you need it.  We recommend signing up for the patient portal called "MyChart".  Sign up information is provided on this After Visit Summary.  MyChart is used to connect with patients for Virtual Visits (Telemedicine).  Patients are able to view lab/test results, encounter notes, upcoming appointments, etc.  Non-urgent messages can be sent to your provider as well.   To learn more about what you can do with MyChart, go to [email or web address].    Your next appointment:   12 month(s)  You will receive a reminder letter in the mail two months in advance. If you don't receive a letter, please call our office to schedule the follow-up appointment.  The format for your next appointment:   In Person  Provider:   DR [facility] OR PA/NP   You have been referred to NUTRITION    Ambulatory referral to [facility]  Where:  [facility] Address: [address] [place] [address] Phone: [phone]  Other Instructions  MONITOR YOUR BLOOD PRESSURE AT HOME. IF IT IS NOT CONSISTENTLY BELOW 130/80 CALL THE OFFICE AT [phone]

## 2020-03-24 NOTE — Progress Notes (Signed)
Cardiology Office Note   Date:  03/24/2020   ID:  [name], DOB 1960/12/28, MRN [id number]  PCP:  [name], Grayling Congress, DO  Cardiologist:   Chilton Si, MD   No chief complaint on file.    History of Present Illness: [name] is a 59 y.o. female with hypertension and hyperlipidemia who presents for follow up.  She was first seen 01/2017 for an evaluation of chest pain and an abnormal EKG.  She was referred for an Lexiscan Myoview that revealed LVEF 53% and no ischemia. She also reported dizziness and her heart rate was noted to be 45 bpm.  Metoprolol was switched to amlodipine.  She reported high blood pressures and it was recommended that she reduce alcohol and Sudafed use.  Based on her lipid profile she was started on rosuvastatin 40 mg daily.  Her evening blood pressures were elevated so amlodipine was split into 2.5 mg twice daily.  Since making that change of blood pressure has been very well-controlled and she only had one elevated reading.   2019 was a stressful year for [name].  Her sister died of an overdose.  Her husband left her one year ago.  She struggled with heavy alcohol use and anxiety.  She was treated for two months and is doing much better now.  Lately she has been doing well physically.  However she has had some problems with her right knee and is currently wearing a brace.  This is limited her ability to exercise.  She has no exertional chest pain or shortness of breath.  She denies lower extremity edema, orthopnea, or PND.  Her blood pressure has been generally well-controlled.  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  Today it is high because her car broke down on the way to the office and she is very stressed.  She notes that her diet has been poor.  She has been making poor food choices and thinks she would benefit from nutrition counseling.   Past Medical History:  Diagnosis Date  . Dizziness 03/05/2017  . Essential  hypertension 03/05/2017  . Hyperlipidemia 03/05/2017  . Infertility, female   . Migraines     Past Surgical History:  Procedure Laterality Date  . ANKLE SURGERY     Left  . SHOULDER SURGERY     Left     Current Outpatient Medications  Medication Sig Dispense Refill  . amLODipine (NORVASC) 5 MG tablet Take 1 tablet (5 mg total) by mouth daily. 90 tablet 3  . aspirin EC 81 MG tablet Take 81 mg by mouth daily.    . baclofen (LIORESAL) 10 MG tablet TAKE 1 TABLET (10 MG TOTAL) BY MOUTH EVERY 8 (EIGHT) HOURS AS NEEDED. 60 tablet 3  . BOTOX 100 units SOLR injection Inject 100 Units into the muscle every 3 (three) months. 100 Units 3  . celecoxib (CELEBREX) 200 MG capsule TAKE 1 CAPSULE BY MOUTH 2 TIMES DAILY. 60 capsule 11  . desonide (DESOWEN) 0.05 % ointment   0  . Fluticasone Propionate (XHANCE) 93 MCG/ACT EXHU Place 2 sprays into the nose 2 (two) times daily. 32 mL 5  . Galcanezumab-gnlm (EMGALITY) 120 MG/ML SOAJ Inject 240 mg into the skin as directed AND 120 mg every 30 (thirty) days. Inj 240mg  once then 120mg  monthly. 1 pen 11  . hydrOXYzine (ATARAX/VISTARIL) 50 MG tablet TAKE 2 TABLETS BY MOUTH 3 TIMES DAILY AS NEEDED FOR ITCHING. 60 tablet  0  . hydrOXYzine (VISTARIL) 50 MG capsule TAKE 2 CAPSULES (100MG ) BY MOUTH THREE TIMES DAILY AS NEEDED FOR ITCHING 60 capsule 5  . ibuprofen (ADVIL) 800 MG tablet Take 1 tablet (800 mg total) by mouth every 8 (eight) hours as needed. 60 tablet 3  . LORazepam (ATIVAN) 0.5 MG tablet 1-2 tabs 30 - 60 min prior to MRI. Do not drive with this medicine. 4 tablet 0  . magnesium oxide (MAG-OX) 400 MG tablet Take 400 mg by mouth daily.    . naratriptan (AMERGE) 2.5 MG tablet TAKE 1 TABLET BY MOUTH AT ONSET OF HEADACHE. IF RETURNS OR DOES NOT RESOLVE MAY REPEAT AFTER 4 HOURS. DO NOT EXCEED 2 TABLETS IN 24 HOURS 10 tablet 11  . NONFORMULARY OR COMPOUNDED ITEM Gaba Calm as needed for anxiety    . ondansetron (ZOFRAN) 8 MG tablet Take 1 tablet (8 mg total) by  mouth every 8 (eight) hours as needed. 20 tablet 3  . pantoprazole (PROTONIX) 40 MG tablet Take 1 tablet (40 mg total) by mouth daily. NEEDS OV/FOLLOW UP BEFORE ANY MORE REFILLS 90 tablet 0  . predniSONE (DELTASONE) 20 MG tablet Take 4 tabs on 2/12, 3 tabs on 2/13, 2 tabs on 2/14 and 1 tab 2/15 10 tablet 0  . Pseudoephedrine-DM-GG (SUDAFED COUGH PO) Take by mouth.    . Rimegepant Sulfate (NURTEC) 75 MG TBDP Take 1 tablet by mouth as needed. 8 tablet 11  . rosuvastatin (CRESTOR) 40 MG tablet Take 1 tablet (40 mg total) by mouth daily. 90 tablet 3  . sertraline (ZOLOFT) 100 MG tablet TAKE 1&1/2 TABLETS BY MOUTH ONCE A DAY (NEEDS OV) 135 tablet 1  . tiZANidine (ZANAFLEX) 4 MG tablet Take 1 tablet (4 mg total) by mouth 3 (three) times daily. 90 tablet 2  . topiramate (TOPAMAX) 200 MG tablet Take 1 tablet (200 mg total) by mouth daily. 30 tablet 5  . traMADol (ULTRAM) 50 MG tablet Take 1 tablet (50 mg total) by mouth every 6 (six) hours as needed for severe pain. 20 tablet 0  . traZODone (DESYREL) 50 MG tablet TAKE 1/2 TO 1 TABLET BY MOUTH AT BEDTIME AS NEEDED FOR SLEEP (NEED OFFICE VISIT BEFORE ANY MORE REFILLS) 15 tablet 0  . Ubrogepant (UBRELVY) 100 MG TABS Take 100 mg by mouth as needed. 8 tablet 11  . vitamin B-12 (CYANOCOBALAMIN) 500 MCG tablet Take 500 mcg by mouth daily.     No current facility-administered medications for this visit.    Allergies:   Augmentin [amoxicillin-pot clavulanate]    Social History:  The patient  reports that she quit smoking about 28 years ago. Her smoking use included cigarettes. She has never used smokeless tobacco. She reports current alcohol use. She reports that she does not use drugs.   Family History:  The patient's family history includes Alcohol abuse in her father and paternal grandfather; Allergic rhinitis in her mother; Cancer in her maternal grandmother; Eczema in her daughter; Heart disease in her maternal grandfather; Hypertension in her mother;  Stroke in her father and another family member.    ROS:  Please see the history of present illness.   Otherwise, review of systems are positive for none.   All other systems are reviewed and negative.    PHYSICAL EXAM: VS:  BP (!) 156/84   Pulse 65   Ht 5\' 4"  (1.626 m)   Wt 175 lb (79.4 kg)   SpO2 96%   BMI 30.04 kg/m  , BMI  Body mass index is 30.04 kg/m. GENERAL:  Well appearing HEENT: Pupils equal round and reactive, fundi not visualized, oral mucosa unremarkable NECK:  No jugular venous distention, waveform within normal limits, carotid upstroke brisk and symmetric, no bruits LUNGS:  Clear to auscultation bilaterally HEART:  RRR.  PMI not displaced or sustained,S1 and S2 within normal limits, no S3, no S4, no clicks, no rubs, no murmurs ABD:  Flat, positive bowel sounds normal in frequency in pitch, no bruits, no rebound, no guarding, no midline pulsatile mass, no hepatomegaly, no splenomegaly EXT:  2 plus pulses throughout, no edema, no cyanosis no clubbing SKIN:  No rashes no nodules NEURO:  Cranial nerves II through XII grossly intact, motor grossly intact throughout PSYCH:  Cognitively intact, oriented to person place and time  EKG:  EKG is ordered today. 01/15/17: sinus bradycardia. Rate 45 bpm. Anterior T-wave inversions. 09/27/18: Sinus bradycardia.  Rate 58 bpm. 03/24/2020: Sinus rhythm.  Rate 65 bpm.  Anterior T wave inversions.  Exercise Myoview 02/20/17: Nuclear stress EF: 53%.  The left ventricular ejection fraction is mildly decreased (45-54%) / lower limit of normal  There was no ST segment deviation noted during stress.  The study is normal.  This is a low risk study.   Recent Labs: 03/24/2020: ALT 35; BUN 19; Creatinine, Ser 0.72; Potassium 4.1; Sodium 142    Lipid Panel    Component Value Date/Time   CHOL 189 03/24/2020 0943   TRIG 193 (H) 03/24/2020 0943   HDL 75 03/24/2020 0943   CHOLHDL 2.5 03/24/2020 0943   LDLCALC 82 03/24/2020 0943       Wt Readings from Last 3 Encounters:  03/24/20 175 lb (79.4 kg)  03/10/20 178 lb (80.7 kg)  02/24/20 178 lb 3.2 oz (80.8 kg)      ASSESSMENT AND PLAN:  # Chest pain: # Shortness of breath: Resolved.  Exercise Myoview was negative for ischemia 02/2017. Her shortness of breath has also improved.   # Hypertension: BP is elevated today.  However she notes a lot of stress as her car broke down on awakening appointment this morning.  She will continue to monitor at home and work, where he has been doing well-continue amlodipine.  # Hyperlipidemia: Check lipids and CMP.  Continue rosuvastatin.    # CV Disease Prevention: Ms. Samara has been struggling with her diet.  We will refer her for nutrition counseling.  Current medicines are reviewed at length with the patient today.  The patient does not have concerns regarding medicines.  The following changes have been made:  None   Labs/ tests ordered today include:   Orders Placed This Encounter  Procedures  . Lipid panel  . Comprehensive metabolic panel  . Ambulatory referral to Nutrition and Diabetic Education  . EKG 12-Lead     Disposition:   FU with Lavonne Cass C. Duke Salvia, MD, [facility] in 1 year.      Signed, Christoffer Currier C. Duke Salvia, MD, [facility]  03/24/2020 5:17 PM    [facility]

## 2020-04-08 MED FILL — TOPIRAMATE 200 MG TABLET: 200 | 30 days supply | Qty: 30 | Fill #3

## 2020-04-12 MED FILL — EMGALITY 120 MG/ML SOAJ: 120 | 30 days supply | Qty: 1 | Fill #4

## 2020-04-12 MED FILL — tiZANidine HCL 4 MG TABS: 4 | 30 days supply | Qty: 90 | Fill #2

## 2020-05-04 MED FILL — CELECOXIB 200 MG CAP: 200 | 30 days supply | Qty: 60 | Fill #7

## 2020-05-04 MED FILL — HYDROXYZINE PAM 50 MG CAP: 50 | 10 days supply | Qty: 60 | Fill #2

## 2020-05-05 ENCOUNTER — Other Ambulatory Visit: Payer: Self-pay

## 2020-05-05 ENCOUNTER — Encounter: Payer: 59 | Attending: Cardiovascular Disease | Admitting: Registered"

## 2020-05-05 ENCOUNTER — Encounter: Payer: Self-pay | Admitting: Registered"

## 2020-05-05 DIAGNOSIS — E781 Pure hyperglyceridemia: Secondary | ICD-10-CM | POA: Diagnosis not present

## 2020-05-05 DIAGNOSIS — I10 Essential (primary) hypertension: Secondary | ICD-10-CM | POA: Insufficient documentation

## 2020-05-05 NOTE — Progress Notes (Signed)
Medical Nutrition Therapy:  Appt start time: 1410 end time:  1510.  Assessment:  Primary concerns today: Pt referred due to HTN, HLD. Pt present for appointment alone.  Pt reports she lost 25 lb over past 1-2 years. Reports since COVID she has gained 30 lb due to sitting at home with nothing to do except cook and eat. Pt reports she has been eating "everything." Reports she blames herself for this gain. Reports concern about her lipids. Reports her blood pressure had been higher than usual, but has improved some. Pt is monitoring her blood pressure.   Pt has a knee injury due to a fall. Pt went to [place] in March and fell at the airport. Pt reports she may have to have surgery. Reports this greatly limits her walking. Reports the surgeon recommended holding off on surgery for now. Reports the surgeon recommended stationary bike and leg lifts.   Pt reports she has lost around 5 lb over past month or so. Pt reports having an organic protein shake for breakfast. Some days she may eat small meals/snacks throughout the day rather than bigger meals on work days. Pt reports she loves to cook. Pt reports she is slow eater and feels she doesn't eat large portions. Pt reports she doesn't usually fry foods but does fry chicken sometimes. Has an airfryer but hasn't tried yet.   Pt wants to know if she can take a fish oil supplement.   Pt enjoys gardening and sitting outside around her pond. 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   Food Allergies/Intolerances: None reported.   GI Concerns: GERD. Reports it is well controlled with protonix and probiotic.   Pertinent Lab Values: 03/24/20: Triglycerides: 193  Preferred Learning Style:   No preference indicated   Learning Readiness:   Ready  MEDICATIONS: See list. Reviewed.    DIETARY INTAKE:  Usual eating pattern includes 3 meals and 2 snacks per day. Reports she has a habit of having a dessert before bedtime. Reports this started when she was  growing up.   Common foods: roasted vegetables.  Avoided foods: sweet potatoes.    Typical Snacks: dates, nuts, figs, cheese, grapes, crackers, berries with Cool Whip, tomatoes and cucumbers, carrots and hummus.   Typical Beverages: water, seltzer water, wine, occasional beer, vodka with lime or cranberry juice, margarita occasionally, diet Coke occasionally.    Location of Meals: living room if alone, dining room if having company. Reports outdoors if during summer. Pt reports she is a slow eater.   Electronics Present at [employer]: sometimes: TV  24-hr recall:  B ( AM): organic protein shake with strawberries, 2 cups coffee with half and half Snk ( AM): None reported.  L ( PM): salad with lettuce, tomatoes, 2 slices bacon, ginger dressing, water  Snk ( PM): 2 dates  D ( PM): 1 slice meatloaf, baked potato  Snk ( PM): 2 glasses wine Beverages: water, 36 oz seltzer water, 2 glasses wine  Usual physical activity: leg lifts, stretches. Limited due to knee injury. Minutes/Week: N/A  Progress Towards Goal(s):  In progress.   Nutritional Diagnosis:  NB-1.1 Food and nutrition-related knowledge deficit As related to heart healthy nutrition therapy .  As evidenced by no prior appointment with dietitian reported.    Intervention:  Nutrition counseling provided.  Dietitian provided education regarding balanced and heart healthy nutrition. Discussed limiting high sugar foods and alcohol and increasing intake of healthy fats in place of saturated fats, particularly increasing sources of omega  3 fatty acids, to help improve triglyceride levels. Encouraged pt to follow doctor's recommendations regarding physical activity with knee injury. Provided education on mindful eating. Discussed that information and recommendations discussed promote whole body health as well as weight loss, however, recommend focusing on the habits rather than the weight. Pt appeared agreeable to information/goals discussed.     Instructions/Goals:  -Have 3 meals per day/eat every 3-5 hours  May do mini meals, try to include smaller portions of each food group on the plate example.   -Include heart healthy unsaturated fats and limit saturated fats. Include good sources of omega 3 fatty acids (see handout). May add a fish oil supplement with 1000 mg omega 3. Recommend Osie Cheeks, Rich Reining, or [employer] brands which are lab tested.   Practice Mindful Eating  At meal and snack times, put away electronics (TV, phone, tablet, etc.) and try to eat seated at a table so you can better focus on eating your meal/snack and promote listening to your body's fullness and hunger signals.  If you feel that you are wanting to snack because your are bored or due to emotions and not because you are hungry, try to do a fun activity (craft, read a book, take a walk, talk with a friend, etc.) for at least 20 minutes.   Make physical activity a part of your week. Try to include at least 30 minutes of physical activity 5 days each week or at least 150 minutes per week. Regular physical activity promotes overall health-including helping to reduce risk for heart disease and diabetes, promoting mental health, and helping [place] sleep better.    Follow your doctor's recommendations for physical activity. Try out the stationary bike and include some entertainment such as music, maybe a buddy as well.    Teaching Method Utilized:  Visual Auditory  Handouts given during visit include:  Balanced plate and food list.   Balanced snack sheet  Heart Healthy Nutrition   Barriers to learning/adherence to lifestyle change: None reported.   Demonstrated degree of understanding via:  Teach Back   Monitoring/Evaluation:  Dietary intake, exercise, and body weight in 1 year(s).

## 2020-05-05 NOTE — Patient Instructions (Addendum)
Instructions/Goals:  -Have 3 meals per day/eat every 3-5 hours  May do mini meals, try to include smaller portions of each food group on the plate example.   -Include heart healthy unsaturated fats and limit saturated fats. Include good sources of omega 3 fatty acids (see handout). May add a fish oil supplement with 1000 mg omega 3. Recommend Nickola Major, Cory Roughen, or [employer] brands which are lab tested.   Practice Mindful Eating  At meal and snack times, put away electronics (TV, phone, tablet, etc.) and try to eat seated at a table so you can better focus on eating your meal/snack and promote listening to your body's fullness and hunger signals.  If you feel that you are wanting to snack because your are bored or due to emotions and not because you are hungry, try to do a fun activity (craft, read a book, take a walk, talk with a friend, etc.) for at least 20 minutes.   Make physical activity a part of your week. Try to include at least 30 minutes of physical activity 5 days each week or at least 150 minutes per week. Regular physical activity promotes overall health-including helping to reduce risk for heart disease and diabetes, promoting mental health, and helping [place] sleep better.    Follow your doctor's recommendations for physical activity. Try out the stationary bike and include some entertainment such as music, maybe a buddy as well.

## 2020-05-06 MED FILL — EMGALITY 120 MG/ML SOAJ: 120 | 30 days supply | Qty: 1 | Fill #5

## 2020-05-13 MED FILL — TOPIRAMATE 200 MG TABLET: 200 | 30 days supply | Qty: 30 | Fill #4

## 2020-05-14 ENCOUNTER — Other Ambulatory Visit: Payer: Self-pay

## 2020-05-14 ENCOUNTER — Ambulatory Visit (INDEPENDENT_AMBULATORY_CARE_PROVIDER_SITE_OTHER): Payer: 59 | Admitting: Physician Assistant

## 2020-05-14 ENCOUNTER — Encounter: Payer: Self-pay | Admitting: Physician Assistant

## 2020-05-14 ENCOUNTER — Encounter: Payer: 59 | Admitting: Physician Assistant

## 2020-05-14 ENCOUNTER — Other Ambulatory Visit: Payer: Self-pay | Admitting: Physician Assistant

## 2020-05-14 VITALS — BP 119/75 | HR 75 | Wt 175.0 lb

## 2020-05-14 DIAGNOSIS — M62838 Other muscle spasm: Secondary | ICD-10-CM

## 2020-05-14 DIAGNOSIS — G43709 Chronic migraine without aura, not intractable, without status migrainosus: Secondary | ICD-10-CM

## 2020-05-14 DIAGNOSIS — IMO0002 Reserved for concepts with insufficient information to code with codable children: Secondary | ICD-10-CM

## 2020-05-14 DIAGNOSIS — G43809 Other migraine, not intractable, without status migrainosus: Secondary | ICD-10-CM

## 2020-05-14 DIAGNOSIS — G43009 Migraine without aura, not intractable, without status migrainosus: Secondary | ICD-10-CM

## 2020-05-14 MED ORDER — ONDANSETRON HCL 8 MG PO TABS: 8.0000 mg | ORAL_TABLET | Freq: Three times a day (TID) | ORAL | 3 refills | Status: DC | PRN

## 2020-05-14 MED ORDER — TOPIRAMATE 200 MG PO TABS: 200.0000 mg | ORAL_TABLET | Freq: Every day | ORAL | 5 refills | Status: DC

## 2020-05-14 MED ORDER — TRAMADOL HCL 50 MG PO TABS: 50.0000 mg | ORAL_TABLET | Freq: Four times a day (QID) | ORAL | 0 refills | Status: DC | PRN

## 2020-05-14 MED ORDER — NURTEC 75 MG PO TBDP: 1.0000 | ORAL_TABLET | ORAL | 11 refills | Status: DC | PRN

## 2020-05-14 MED ORDER — ONABOTULINUMTOXINA 100 UNITS IJ SOLR
100.0000 [IU] | Freq: Once | INTRAMUSCULAR | Status: AC
  Administered 2020-05-14: 100 [IU] via INTRAMUSCULAR

## 2020-05-14 MED ORDER — EMGALITY 120 MG/ML ~~LOC~~ SOAJ: SUBCUTANEOUS | 11 refills | Status: DC

## 2020-05-14 MED FILL — traMADol HCL 50 MG TABS: 50 | 5 days supply | Qty: 20 | Fill #0

## 2020-05-14 MED FILL — ONDANSETRON HCL 8 MG TABLET: 8 | 6 days supply | Qty: 20 | Fill #0

## 2020-05-14 NOTE — Progress Notes (Signed)
S: Pt in office today for Botox injections. Her Botox has been working as part of her strategy for migraine prevention.    O: BP 119/75   Pulse 75   Wt 175 lb (79.4 kg)   BMI 30.04 kg/m    Botox Procedure Note Vial of Botox was : Supplied by Patient Lot # [id number] Expiration Date 09/2022   Botox Dosing by [facility] Group for Chronic Migraine   Injection Sites for Migraines  Botox 100 units was injected using the dosage in the table above in the pattern shown above.  A: Migraine  Muscle spasm   P: Botox 100 units injected today.  Refills of medications as required. RTC 3 Months.

## 2020-06-15 ENCOUNTER — Other Ambulatory Visit: Payer: Self-pay | Admitting: Physician Assistant

## 2020-06-15 ENCOUNTER — Other Ambulatory Visit: Payer: Self-pay | Admitting: Family Medicine

## 2020-06-15 DIAGNOSIS — K219 Gastro-esophageal reflux disease without esophagitis: Secondary | ICD-10-CM

## 2020-06-15 DIAGNOSIS — M542 Cervicalgia: Secondary | ICD-10-CM

## 2020-06-15 DIAGNOSIS — G4489 Other headache syndrome: Secondary | ICD-10-CM

## 2020-06-15 MED FILL — SERTRALINE HCL 100 MG TABS: 100 | 90 days supply | Qty: 135 | Fill #1

## 2020-06-15 MED FILL — BACLOFEN 10 MG TABS: 10 | 20 days supply | Qty: 60 | Fill #2

## 2020-06-15 MED FILL — HYDROXYZINE PAM 50 MG CAP: 50 | 10 days supply | Qty: 60 | Fill #3

## 2020-06-15 MED FILL — CELECOXIB 200 MG CAP: 200 | 30 days supply | Qty: 60 | Fill #0

## 2020-06-15 MED FILL — AMLODIPINE BESYLATE 5 MG TA: 5 | 90 days supply | Qty: 90 | Fill #1

## 2020-06-18 MED FILL — EMGALITY 120 MG/ML SOAJ: 120 | 30 days supply | Qty: 1 | Fill #5

## 2020-06-21 ENCOUNTER — Ambulatory Visit: Payer: 59 | Admitting: Registered"

## 2020-06-29 MED FILL — TOPIRAMATE 200 MG TABLET: 200 | 30 days supply | Qty: 30 | Fill #5

## 2020-06-30 MED FILL — RESTASIS 0.05% EYE EMULSION: 0.05 | 30 days supply | Qty: 60 | Fill #2

## 2020-06-30 MED FILL — NURTEC 75 MG TBDP: 75 | 30 days supply | Qty: 8 | Fill #0

## 2020-06-30 MED FILL — NARATRIPTAN HCL 2.5 MG TAB: 2.5 | 20 days supply | Qty: 10 | Fill #0

## 2020-07-01 MED FILL — EMGALITY 120 MG/ML SOAJ: 120 | 30 days supply | Qty: 1 | Fill #0

## 2020-07-05 ENCOUNTER — Other Ambulatory Visit: Payer: Self-pay | Admitting: Family Medicine

## 2020-07-05 ENCOUNTER — Ambulatory Visit: Payer: 59 | Admitting: Family Medicine

## 2020-07-05 ENCOUNTER — Other Ambulatory Visit: Payer: Self-pay

## 2020-07-05 ENCOUNTER — Encounter: Payer: Self-pay | Admitting: Family Medicine

## 2020-07-05 VITALS — BP 118/80 | HR 90 | Temp 98.2°F | Resp 18 | Ht 64.0 in | Wt 169.8 lb

## 2020-07-05 DIAGNOSIS — E663 Overweight: Secondary | ICD-10-CM | POA: Diagnosis not present

## 2020-07-05 DIAGNOSIS — G4700 Insomnia, unspecified: Secondary | ICD-10-CM

## 2020-07-05 DIAGNOSIS — K219 Gastro-esophageal reflux disease without esophagitis: Secondary | ICD-10-CM | POA: Diagnosis not present

## 2020-07-05 MED ORDER — PANTOPRAZOLE SODIUM 40 MG PO TBEC: 40.0000 mg | DELAYED_RELEASE_TABLET | Freq: Every day | ORAL | 3 refills | Status: DC

## 2020-07-05 MED ORDER — SAXENDA 18 MG/3ML ~~LOC~~ SOPN: 3.0000 mg | PEN_INJECTOR | Freq: Every day | SUBCUTANEOUS | 2 refills | Status: DC

## 2020-07-05 MED ORDER — TRAZODONE HCL 50 MG PO TABS: ORAL_TABLET | ORAL | 0 refills | Status: DC

## 2020-07-05 MED ORDER — INSULIN PEN NEEDLE 31G X 6 MM MISC: 3 refills | Status: DC

## 2020-07-05 MED FILL — traZODone HCL 50 MG TABS: 50 | 15 days supply | Qty: 15 | Fill #0

## 2020-07-05 MED FILL — PANTOPRAZOLE SOD DR 40 MG T: 40 | 90 days supply | Qty: 90 | Fill #0

## 2020-07-05 MED FILL — UNIFINE PENTIPS 6MM 31G: 31G X 6 MM | 90 days supply | Qty: 100 | Fill #0

## 2020-07-05 NOTE — Assessment & Plan Note (Signed)
trazadone working  Refill sent

## 2020-07-05 NOTE — Patient Instructions (Signed)
BMI for Adults What is BMI? Body mass index (BMI) is a number that is calculated from a person's weight and height. BMI can help estimate how much of a person's weight is composed of fat. BMI does not measure body fat directly. Rather, it is an alternative to procedures that directly measure body fat, which can be difficult and expensive. BMI can help identify people who may be at higher risk for certain medical problems. What are BMI measurements used for? BMI is used as a screening tool to identify possible weight problems. It helps determine whether a person is obese, overweight, a healthy weight, or underweight. BMI is useful for:  Identifying a weight problem that may be related to a medical condition or may increase the risk for medical problems.  Promoting changes, such as changes in diet and exercise, to help reach a healthy weight. BMI screening can be repeated to see if these changes are working. How is BMI calculated? BMI involves measuring your weight in relation to your height. Both height and weight are measured, and the BMI is calculated from those numbers. This can be done either in English (U.S.) or metric measurements. Note that charts and online BMI calculators are available to help you find your BMI quickly and easily without having to do these calculations yourself. To calculate your BMI in English (U.S.) measurements:  1. Measure your weight in pounds (lb). 2. Multiply the number of pounds by 703. ? For example, for a person who weighs 180 lb, multiply that number by 703, which equals 126,540. 3. Measure your height in inches. Then multiply that number by itself to get a measurement called "inches squared." ? For example, for a person who is 70 inches tall, the "inches squared" measurement is 70 inches x 70 inches, which equals 4,900 inches squared. 4. Divide the total from step 2 (number of lb x 703) by the total from step 3 (inches squared): 126,540  4,900 = 25.8. This is  your BMI. To calculate your BMI in metric measurements: 1. Measure your weight in kilograms (kg). 2. Measure your height in meters (m). Then multiply that number by itself to get a measurement called "meters squared." ? For example, for a person who is 1.75 m tall, the "meters squared" measurement is 1.75 m x 1.75 m, which is equal to 3.1 meters squared. 3. Divide the number of kilograms (your weight) by the meters squared number. In this example: 70  3.1 = 22.6. This is your BMI. What do the results mean? BMI charts are used to identify whether you are underweight, normal weight, overweight, or obese. The following guidelines will be used:  Underweight: BMI less than 18.5.  Normal weight: BMI between 18.5 and 24.9.  Overweight: BMI between 25 and 29.9.  Obese: BMI of 30 or above. Keep these notes in mind:  Weight includes both fat and muscle, so someone with a muscular build, such as an athlete, may have a BMI that is higher than 24.9. In cases like these, BMI is not an accurate measure of body fat.  To determine if excess body fat is the cause of a BMI of 25 or higher, further assessments may need to be done by a health care provider.  BMI is usually interpreted in the same way for men and women. Where to find more information For more information about BMI, including tools to quickly calculate your BMI, go to these websites:  Centers for Disease Control and Prevention: www.cdc.gov  American   Heart Association: www.heart.org  National Heart, Lung, and Blood Institute: www.nhlbi.nih.gov Summary  Body mass index (BMI) is a number that is calculated from a person's weight and height.  BMI may help estimate how much of a person's weight is composed of fat. BMI can help identify those who may be at higher risk for certain medical problems.  BMI can be measured using English measurements or metric measurements.  BMI charts are used to identify whether you are underweight, normal  weight, overweight, or obese. This information is not intended to replace advice given to you by your health care provider. Make sure you discuss any questions you have with your health care provider. Document Revised: 08/20/2019 Document Reviewed: 06/27/2019 Elsevier Patient Education  2020 Elsevier Inc.  

## 2020-07-05 NOTE — Assessment & Plan Note (Signed)
Pt would like to try saxenda--- start at 0.6 mg daily and inc by 0.6 each week as needed to help with weight

## 2020-07-05 NOTE — Progress Notes (Signed)
Patient ID: [name], female    DOB: June 29, 1961  Age: 59 y.o. MRN: [id number]    Subjective:  Subjective  HPI [name] presents for meds refills   She also would like something to help her lose weight     Review of Systems  Constitutional: Negative for activity change, appetite change, diaphoresis, fatigue and unexpected weight change.  Eyes: Negative for pain, redness and visual disturbance.  Respiratory: Negative for cough, chest tightness, shortness of breath and wheezing.   Cardiovascular: Negative for chest pain, palpitations and leg swelling.  Endocrine: Negative for cold intolerance, heat intolerance, polydipsia, polyphagia and polyuria.  Genitourinary: Negative for difficulty urinating, dysuria and frequency.  Neurological: Negative for dizziness, light-headedness, numbness and headaches.  Psychiatric/Behavioral: Negative for behavioral problems and dysphoric mood. The patient is not nervous/anxious.     History Past Medical History:  Diagnosis Date  . Dizziness 03/05/2017  . Essential hypertension 03/05/2017  . Hyperlipidemia 03/05/2017  . Infertility, female   . Migraines     She has a past surgical history that includes Shoulder surgery and Ankle surgery.   Her family history includes Alcohol abuse in her father and paternal grandfather; Allergic rhinitis in her mother; Cancer in her maternal grandmother; Eczema in her daughter; Heart disease in her maternal grandfather; Hyperlipidemia in her mother; Hypertension in her mother; Sleep apnea in her mother; Stroke in her father and another family member.She reports that she quit smoking about 28 years ago. Her smoking use included cigarettes. She has never used smokeless tobacco. She reports current alcohol use. She reports that she does not use drugs.  Current Outpatient Medications on File Prior to Visit  Medication Sig Dispense Refill  . amLODipine (NORVASC) 5 MG tablet Take 1 tablet (5 mg total) by mouth  daily. 90 tablet 3  . Ascorbic Acid (VITAMIN C PO) Take by mouth.    Marland Kitchen aspirin EC 81 MG tablet Take 81 mg by mouth daily.    . baclofen (LIORESAL) 10 MG tablet TAKE 1 TABLET (10 MG TOTAL) BY MOUTH EVERY 8 (EIGHT) HOURS AS NEEDED. 60 tablet 3  . BOTOX 100 units SOLR injection Inject 100 Units into the muscle every 3 (three) months. 100 Units 3  . Calcium Carbonate (CALCIUM 600 PO) Take by mouth.    . celecoxib (CELEBREX) 200 MG capsule TAKE 1 CAPSULE BY MOUTH 2 TIMES DAILY. 60 capsule 11  . desonide (DESOWEN) 0.05 % ointment   0  . Fluticasone Propionate (XHANCE) 93 MCG/ACT EXHU Place 2 sprays into the nose 2 (two) times daily. 32 mL 5  . Galcanezumab-gnlm (EMGALITY) 120 MG/ML SOAJ Inject 240 mg into the skin as directed AND 120 mg every 30 (thirty) days. Inj 240mg  once then 120mg  monthly. 1 pen 11  . hydrOXYzine (ATARAX/VISTARIL) 50 MG tablet TAKE 2 TABLETS BY MOUTH 3 TIMES DAILY AS NEEDED FOR ITCHING. 60 tablet 0  . hydrOXYzine (VISTARIL) 50 MG capsule TAKE 2 CAPSULES (100MG ) BY MOUTH THREE TIMES DAILY AS NEEDED FOR ITCHING 60 capsule 5  . ibuprofen (ADVIL) 800 MG tablet Take 1 tablet (800 mg total) by mouth every 8 (eight) hours as needed. 60 tablet 3  . LORazepam (ATIVAN) 0.5 MG tablet 1-2 tabs 30 - 60 min prior to MRI. Do not drive with this medicine. 4 tablet 0  . magnesium oxide (MAG-OX) 400 MG tablet Take 400 mg by mouth daily.    . Multiple Vitamins-Minerals (ZINC PO) Take by mouth.    . naratriptan (AMERGE)  2.5 MG tablet TAKE 1 TABLET BY MOUTH AT ONSET OF HEADACHE. IF RETURNS OR DOES NOT RESOLVE MAY REPEAT AFTER 4 HOURS. DO NOT EXCEED 2 TABLETS IN 24 HOURS 10 tablet 11  . NONFORMULARY OR COMPOUNDED ITEM Gaba Calm as needed for anxiety    . ondansetron (ZOFRAN) 8 MG tablet Take 1 tablet (8 mg total) by mouth every 8 (eight) hours as needed. 20 tablet 3  . Probiotic Product (PROBIOTIC DAILY PO) Take by mouth.    . Pseudoephedrine-DM-GG (SUDAFED COUGH PO) Take by mouth.    . RESTASIS 0.05  % ophthalmic emulsion Place 1 drop into both eyes 2 (two) times daily.    . Rimegepant Sulfate (NURTEC) 75 MG TBDP Take 1 tablet by mouth as needed. 8 tablet 11  . rosuvastatin (CRESTOR) 40 MG tablet Take 1 tablet (40 mg total) by mouth daily. 90 tablet 3  . sertraline (ZOLOFT) 100 MG tablet TAKE 1&1/2 TABLETS BY MOUTH ONCE A DAY (NEEDS OV) 135 tablet 1  . tiZANidine (ZANAFLEX) 4 MG tablet Take 1 tablet (4 mg total) by mouth 3 (three) times daily. 90 tablet 2  . topiramate (TOPAMAX) 200 MG tablet Take 1 tablet (200 mg total) by mouth daily. 30 tablet 5  . traMADol (ULTRAM) 50 MG tablet Take 1 tablet (50 mg total) by mouth every 6 (six) hours as needed for severe pain. 20 tablet 0  . Ubrogepant (UBRELVY) 100 MG TABS Take 100 mg by mouth as needed. 8 tablet 11  . UNABLE TO FIND CBD 50 mg    . vitamin B-12 (CYANOCOBALAMIN) 500 MCG tablet Take 500 mcg by mouth daily.    Marland Kitchen VITAMIN D PO Take by mouth.     No current facility-administered medications on file prior to visit.     Objective:  Objective  Physical Exam Vitals and nursing note reviewed.  Constitutional:      Appearance: She is well-developed.  HENT:     Head: Normocephalic and atraumatic.  Eyes:     Conjunctiva/sclera: Conjunctivae normal.  Neck:     Thyroid: No thyromegaly.     Vascular: No carotid bruit or JVD.  Cardiovascular:     Rate and Rhythm: Normal rate and regular rhythm.     Heart sounds: Normal heart sounds. No murmur heard.   Pulmonary:     Effort: Pulmonary effort is normal. No respiratory distress.     Breath sounds: Normal breath sounds. No wheezing or rales.  Chest:     Chest wall: No tenderness.  Musculoskeletal:     Cervical back: Normal range of motion and neck supple.  Neurological:     Mental Status: She is alert and oriented to person, place, and time.    BP 118/80   Pulse 90   Temp 98.2 F (36.8 C) (Oral)   Resp 18   Ht 5\' 4"  (1.626 m)   Wt 169 lb 12.8 oz (77 kg)   SpO2 96%   BMI 29.15  kg/m  Wt Readings from Last 3 Encounters:  07/05/20 169 lb 12.8 oz (77 kg)  05/14/20 175 lb (79.4 kg)  05/05/20 173 lb 11.2 oz (78.8 kg)     Lab Results  Component Value Date   WBC 7.6 11/26/2018   HGB 12.1 11/26/2018   HCT 35.0 11/26/2018   PLT 287 11/26/2018   GLUCOSE 87 03/24/2020   CHOL 189 03/24/2020   TRIG 193 (H) 03/24/2020   HDL 75 03/24/2020   LDLCALC 82 03/24/2020   ALT  35 (H) 03/24/2020   AST 26 03/24/2020   NA 142 03/24/2020   K 4.1 03/24/2020   CL 105 03/24/2020   CREATININE 0.72 03/24/2020   BUN 19 03/24/2020   CO2 22 03/24/2020   TSH 1.600 11/26/2018   HGBA1C 5.5 04/16/2015    MR FEMUR RIGHT W WO CONTRAST  Result Date: 07/02/2019 CLINICAL DATA:  The patient suffered a right upper leg laceration just superior to the right knee 3 weeks ago shaving. Continued bleeding. Initial encounter. Creatinine [facility] [address] Results: Creatinine 1.0 mg/dL. EXAM: MRI OF THE RIGHT FEMUR WITHOUT AND WITH CONTRAST TECHNIQUE: Multiplanar, multisequence MR imaging of the right femur was performed both before and after administration of intravenous contrast. CONTRAST:  15 mL MULTIHANCE GADOBENATE DIMEGLUMINE 529 MG/ML IV SOLN COMPARISON:  None. FINDINGS: Bones/Joint/Cartilage No fracture, stress change or worrisome bony lesion is identified. Degenerative change about the knees appears worst in the lateral and patellofemoral compartments on the right. Small bilateral knee joint effusions incidentally noted. Ligaments Intact. Muscles and Tendons Intact and normal in appearance. Soft tissues Appear normal. No fluid collection or mass is identified. The patient's laceration is not visualized. IMPRESSION: No acute abnormality or finding to explain the patient's symptoms. Osteoarthritis about the knees appears worst in the patellofemoral and lateral compartments of the right knee. Electronically Signed   By: Drusilla Kannerhomas  Dalessio M.D.   On:  07/02/2019 13:59     Assessment & Plan:  Plan  I have discontinued Doyle AskewAmanda R. Woolverton "Mandy"'s predniSONE. I am also having her start on Saxenda and Insulin Pen Needle. Additionally, I am having her maintain her Pseudoephedrine-DM-GG (SUDAFED COUGH PO), aspirin EC, Fluticasone Propionate, magnesium oxide, vitamin B-12, NONFORMULARY OR COMPOUNDED ITEM, desonide, hydrOXYzine, naratriptan, hydrOXYzine, sertraline, baclofen, ibuprofen, Ubrelvy, tiZANidine, Botox, LORazepam, rosuvastatin, amLODipine, Probiotic Product (PROBIOTIC DAILY PO), Calcium Carbonate (CALCIUM 600 PO), Multiple Vitamins-Minerals (ZINC PO), Ascorbic Acid (VITAMIN C PO), VITAMIN D PO, UNABLE TO FIND, topiramate, Emgality, ondansetron, Nurtec, traMADol, celecoxib, Restasis, traZODone, and pantoprazole.  Meds ordered this encounter  Medications  . traZODone (DESYREL) 50 MG tablet    Sig: TAKE 1/2 TO 1 TABLET BY MOUTH AT BEDTIME AS NEEDED FOR SLEEP (NEED OFFICE VISIT BEFORE ANY MORE REFILLS)    Dispense:  15 tablet    Refill:  0  . pantoprazole (PROTONIX) 40 MG tablet    Sig: Take 1 tablet (40 mg total) by mouth daily. NEEDS OV/FOLLOW UP BEFORE ANY MORE REFILLS    Dispense:  90 tablet    Refill:  3  . Liraglutide -Weight Management (SAXENDA) 18 MG/3ML SOPN    Sig: Inject 0.5 mLs (3 mg total) into the skin daily.    Dispense:  5 pen    Refill:  2  . Insulin Pen Needle 31G X 6 MM MISC    Sig: As directed    Dispense:  30 each    Refill:  3    Problem List Items Addressed This Visit      Unprioritized   Insomnia    trazadone working  Refill sent      Relevant Medications   traZODone (DESYREL) 50 MG tablet   Overweight - Primary    Pt would like to try saxenda--- start at 0.6 mg daily and inc by 0.6 each week as needed to help with weight       Relevant Medications   Liraglutide -Weight Management (SAXENDA) 18 MG/3ML SOPN   Insulin Pen Needle  31G X 6 MM MISC    Other Visit Diagnoses    Gastroesophageal reflux  disease       Relevant Medications   pantoprazole (PROTONIX) 40 MG tablet      Follow-up: Return in about 3 months (around 10/05/2020), or if symptoms worsen or fail to improve, for 9.  Donato Schultz, DO

## 2020-07-16 MED FILL — ROSUVASTATIN CALCIUM 40 MG: 40 | 90 days supply | Qty: 90 | Fill #1

## 2020-08-03 ENCOUNTER — Encounter: Payer: Self-pay | Admitting: Family Medicine

## 2020-08-03 DIAGNOSIS — H04129 Dry eye syndrome of unspecified lacrimal gland: Secondary | ICD-10-CM | POA: Diagnosis not present

## 2020-08-03 DIAGNOSIS — H25813 Combined forms of age-related cataract, bilateral: Secondary | ICD-10-CM | POA: Diagnosis not present

## 2020-08-03 MED FILL — ROSUVASTATIN CALCIUM 40 MG: 40 | 90 days supply | Qty: 90 | Fill #1

## 2020-08-04 ENCOUNTER — Telehealth: Payer: Self-pay

## 2020-08-04 MED FILL — INVELTYS 1 % SUSP: 1 | 30 days supply | Qty: 6 | Fill #0

## 2020-08-04 NOTE — Telephone Encounter (Signed)
PA initiated via Covermymeds; KEY: [id number]. Awaiting determination.

## 2020-08-06 NOTE — Telephone Encounter (Signed)
PA approved. Effective 08/04/20 to 12/03/2020.

## 2020-08-07 MED FILL — SAXENDA 18 MG/3 ML PEN: 18 | 30 days supply | Qty: 15 | Fill #0

## 2020-08-11 ENCOUNTER — Telehealth: Payer: Self-pay

## 2020-08-11 MED FILL — BACLOFEN 10 MG TABS: 10 | 20 days supply | Qty: 60 | Fill #3

## 2020-08-11 MED FILL — TOPIRAMATE 200 MG TABLET: 200 | 30 days supply | Qty: 30 | Fill #0

## 2020-08-11 MED FILL — CELECOXIB 200 MG CAP: 200 | 30 days supply | Qty: 60 | Fill #1

## 2020-08-11 MED FILL — UBRELVY 100 MG TABS: 100 | 8 days supply | Qty: 8 | Fill #1

## 2020-08-11 MED FILL — NURTEC 75 MG TBDP: 75 | 30 days supply | Qty: 8 | Fill #1

## 2020-08-11 MED FILL — NARATRIPTAN HCL 2.5 MG TAB: 2.5 | 20 days supply | Qty: 10 | Fill #1

## 2020-08-11 NOTE — Telephone Encounter (Signed)
PA has been sent to patient insurance company([employer]) for approval. It can take up to 72 hours for approval. Ref#[id number]

## 2020-08-12 ENCOUNTER — Encounter: Payer: Self-pay | Admitting: *Deleted

## 2020-08-31 ENCOUNTER — Other Ambulatory Visit (HOSPITAL_COMMUNITY): Payer: Self-pay | Admitting: Optometry

## 2020-08-31 DIAGNOSIS — H04129 Dry eye syndrome of unspecified lacrimal gland: Secondary | ICD-10-CM | POA: Diagnosis not present

## 2020-08-31 MED FILL — INVELTYS 1 % SUSP: 1 | 84 days supply | Qty: 8 | Fill #0

## 2020-09-24 MED FILL — TOPIRAMATE 200 MG TABLET: 200 | 30 days supply | Qty: 30 | Fill #1

## 2020-09-24 MED FILL — HYDROXYZINE PAM 50 MG CAP: 50 | 10 days supply | Qty: 60 | Fill #4

## 2020-09-24 MED FILL — NURTEC 75 MG TBDP: 75 | 30 days supply | Qty: 8 | Fill #2

## 2020-09-24 MED FILL — EMGALITY 120 MG/ML SOAJ: 120 | 30 days supply | Qty: 1 | Fill #1

## 2020-09-24 MED FILL — tiZANidine HCL 4 MG TABS: 4 | 30 days supply | Qty: 90 | Fill #0

## 2020-09-25 MED FILL — CELECOXIB 200 MG CAP: 200 | 30 days supply | Qty: 60 | Fill #2

## 2020-10-08 ENCOUNTER — Encounter: Payer: 59 | Admitting: Family Medicine

## 2020-10-14 ENCOUNTER — Other Ambulatory Visit: Payer: Self-pay | Admitting: Family Medicine

## 2020-10-14 ENCOUNTER — Other Ambulatory Visit: Payer: Self-pay | Admitting: Physician Assistant

## 2020-10-14 DIAGNOSIS — G43009 Migraine without aura, not intractable, without status migrainosus: Secondary | ICD-10-CM

## 2020-10-14 DIAGNOSIS — G43011 Migraine without aura, intractable, with status migrainosus: Secondary | ICD-10-CM

## 2020-10-14 DIAGNOSIS — M62838 Other muscle spasm: Secondary | ICD-10-CM

## 2020-10-14 DIAGNOSIS — F419 Anxiety disorder, unspecified: Secondary | ICD-10-CM

## 2020-10-14 MED FILL — SERTRALINE HCL 100 MG TABS: 100 | 30 days supply | Qty: 45 | Fill #0

## 2020-10-14 MED FILL — AMLODIPINE BESYLATE 5 MG TA: 5 | 90 days supply | Qty: 90 | Fill #2

## 2020-10-14 MED FILL — RESTASIS 0.05% EYE EMULSION: 0.05 | 30 days supply | Qty: 60 | Fill #3

## 2020-10-14 MED FILL — PANTOPRAZOLE SOD DR 40 MG T: 40 | 90 days supply | Qty: 90 | Fill #1

## 2020-10-14 MED FILL — INVELTYS 1 % SUSP: 1 | 84 days supply | Qty: 8 | Fill #0

## 2020-10-15 ENCOUNTER — Other Ambulatory Visit: Payer: Self-pay | Admitting: Physician Assistant

## 2020-10-15 MED FILL — BACLOFEN 10 MG TABS: 10 | 20 days supply | Qty: 60 | Fill #0

## 2020-10-15 MED FILL — NARATRIPTAN HCL 2.5 MG TAB: 2.5 | 30 days supply | Qty: 10 | Fill #0

## 2020-10-18 ENCOUNTER — Encounter: Payer: Self-pay | Admitting: *Deleted

## 2020-10-18 MED FILL — NURTEC 75 MG TBDP: 75 | 30 days supply | Qty: 8 | Fill #3

## 2020-10-18 MED FILL — BOTOX 100 UNITS VIAL: 100 | 90 days supply | Qty: 1 | Fill #1

## 2020-10-26 MED FILL — EMGALITY 120 MG/ML SOAJ: 120 | 30 days supply | Qty: 1 | Fill #2

## 2020-11-01 DIAGNOSIS — H5203 Hypermetropia, bilateral: Secondary | ICD-10-CM | POA: Diagnosis not present

## 2020-11-01 DIAGNOSIS — H524 Presbyopia: Secondary | ICD-10-CM | POA: Diagnosis not present

## 2020-11-01 DIAGNOSIS — H52223 Regular astigmatism, bilateral: Secondary | ICD-10-CM | POA: Diagnosis not present

## 2020-11-11 MED FILL — ROSUVASTATIN CALCIUM 40 MG: 40 | 90 days supply | Qty: 90 | Fill #2

## 2020-11-11 MED FILL — TOPIRAMATE 200 MG TABLET: 200 | 30 days supply | Qty: 30 | Fill #2

## 2020-11-11 MED FILL — CELECOXIB 200 MG CAP: 200 | 30 days supply | Qty: 60 | Fill #3

## 2020-11-22 ENCOUNTER — Telehealth: Payer: Self-pay

## 2020-11-22 NOTE — Telephone Encounter (Signed)
Called patient. Lvm to call back to set up appt

## 2020-11-22 NOTE — Telephone Encounter (Signed)
Received PA for continue of therapy for Saxenda. We don't have an updated weight since starting therapy. Can you contact her?

## 2020-12-01 ENCOUNTER — Other Ambulatory Visit: Payer: Self-pay | Admitting: Family Medicine

## 2020-12-01 DIAGNOSIS — F419 Anxiety disorder, unspecified: Secondary | ICD-10-CM

## 2020-12-01 MED FILL — BACLOFEN 10 MG TABS: 10 | 20 days supply | Qty: 60 | Fill #1

## 2020-12-01 MED FILL — HYDROXYZINE PAM 50 MG CAP: 50 | 10 days supply | Qty: 60 | Fill #0

## 2020-12-01 MED FILL — SERTRALINE HCL 100 MG TABS: 100 | 30 days supply | Qty: 45 | Fill #0

## 2020-12-07 MED FILL — EMGALITY 120 MG/ML SOAJ: 120 | 30 days supply | Qty: 1 | Fill #3

## 2020-12-24 ENCOUNTER — Ambulatory Visit (INDEPENDENT_AMBULATORY_CARE_PROVIDER_SITE_OTHER): Payer: 59 | Admitting: Physician Assistant

## 2020-12-24 ENCOUNTER — Other Ambulatory Visit: Payer: Self-pay | Admitting: Physician Assistant

## 2020-12-24 ENCOUNTER — Other Ambulatory Visit: Payer: Self-pay

## 2020-12-24 ENCOUNTER — Encounter: Payer: Self-pay | Admitting: Physician Assistant

## 2020-12-24 VITALS — BP 128/81 | HR 67

## 2020-12-24 DIAGNOSIS — G43709 Chronic migraine without aura, not intractable, without status migrainosus: Secondary | ICD-10-CM | POA: Diagnosis not present

## 2020-12-24 DIAGNOSIS — G43009 Migraine without aura, not intractable, without status migrainosus: Secondary | ICD-10-CM | POA: Diagnosis not present

## 2020-12-24 DIAGNOSIS — M62838 Other muscle spasm: Secondary | ICD-10-CM

## 2020-12-24 DIAGNOSIS — M542 Cervicalgia: Secondary | ICD-10-CM | POA: Diagnosis not present

## 2020-12-24 MED ORDER — BOTOX 100 UNITS IJ SOLR: 100.0000 [IU] | INTRAMUSCULAR | 3 refills | Status: DC

## 2020-12-24 MED ORDER — BACLOFEN 10 MG PO TABS: 10.0000 mg | ORAL_TABLET | Freq: Three times a day (TID) | ORAL | 3 refills | Status: DC | PRN

## 2020-12-24 MED ORDER — TIZANIDINE HCL 4 MG PO TABS: 4.0000 mg | ORAL_TABLET | Freq: Three times a day (TID) | ORAL | 2 refills | Status: DC

## 2020-12-24 MED ORDER — ONABOTULINUMTOXINA 100 UNITS IJ SOLR
100.0000 [IU] | Freq: Once | INTRAMUSCULAR | Status: AC
Start: 2020-12-24 — End: 2020-12-24
  Administered 2020-12-24: 100 [IU] via INTRAMUSCULAR

## 2020-12-24 MED ORDER — ONDANSETRON HCL 8 MG PO TABS: 8.0000 mg | ORAL_TABLET | Freq: Three times a day (TID) | ORAL | 3 refills | Status: DC | PRN

## 2020-12-24 MED ORDER — TRAMADOL HCL 50 MG PO TABS: 50.0000 mg | ORAL_TABLET | Freq: Four times a day (QID) | ORAL | 0 refills | Status: DC | PRN

## 2020-12-24 MED ORDER — NURTEC 75 MG PO TBDP: 75.0000 mg | ORAL_TABLET | ORAL | 11 refills | Status: DC

## 2020-12-24 MED FILL — tiZANidine HCL 4 MG TABS: 4 | 30 days supply | Qty: 90 | Fill #0

## 2020-12-24 MED FILL — BACLOFEN 10 MG TABS: 10 | 20 days supply | Qty: 60 | Fill #0

## 2020-12-24 MED FILL — BOTOX 100 UNITS VIAL: 100 | 30 days supply | Qty: 1 | Fill #0

## 2020-12-24 MED FILL — traMADol HCL 50 MG TABS: 50 | 2 days supply | Qty: 10 | Fill #0

## 2020-12-24 MED FILL — ONDANSETRON HCL 8 MG TABLET: 8 | 6 days supply | Qty: 20 | Fill #0

## 2020-12-24 NOTE — Progress Notes (Signed)
S: Pt in office today for Botox injections. Her Botox has been working very well for migraine prevention.    O: BP 128/81   Pulse 67    Botox Procedure Note Vial of Botox was : Supplied by Patient   Botox Dosing by Muscle Group for Chronic Migraine   Injection Sites for Migraines  Botox 100 units was injected using the dosage in the table above in the pattern shown above.  A: Migraine  Muscle spasm   P: Botox 100 units injected today.  meds refilled as needed Will trial qod nurtec for prevention RTC 3 Months.

## 2020-12-24 NOTE — Patient Instructions (Signed)
OnabotulinumtoxinA injection (Medical Use) What is this medicine? ONABOTULINUMTOXINA (o na BOTT you lye num tox in eh) is a neuro-muscular blocker. This medicine is used to treat crossed eyes, eyelid spasms, severe neck muscle spasms, ankle and toe muscle spasms, and elbow, wrist, and finger muscle spasms. It is also used to treat excessive underarm sweating, to prevent chronic migraine headaches, and to treat loss of bladder control due to neurologic conditions such as multiple sclerosis or spinal cord injury. This medicine may be used for other purposes; ask your health care provider or pharmacist if you have questions. COMMON BRAND NAME(S): Botox What should I tell my health care provider before I take this medicine? They need to know if you have any of these conditions:  breathing problems  cerebral palsy spasms  difficulty urinating  heart problems  history of surgery where this medicine is going to be used  infection at the site where this medicine is going to be used  myasthenia gravis or other neurologic disease  nerve or muscle disease  surgery plans  take medicines that treat or prevent blood clots  thyroid problems  an unusual or allergic reaction to botulinum toxin, albumin, other medicines, foods, dyes, or preservatives  pregnant or trying to get pregnant  breast-feeding How should I use this medicine? This medicine is for injection into a muscle. It is given by a health care professional in a hospital or clinic setting. Talk to your pediatrician regarding the use of this medicine in children. While this drug may be prescribed for children as young as 2 years old for selected conditions, precautions do apply. Overdosage: If you think you have taken too much of this medicine contact a poison control center or emergency room at once. NOTE: This medicine is only for you. Do not share this medicine with others. What if I miss a dose? This does not apply. What may  interact with this medicine?  aminoglycoside antibiotics like gentamicin, neomycin, tobramycin  muscle relaxants  other botulinum toxin injections This list may not describe all possible interactions. Give your health care provider a list of all the medicines, herbs, non-prescription drugs, or dietary supplements you use. Also tell them if you smoke, drink alcohol, or use illegal drugs. Some items may interact with your medicine. What should I watch for while using this medicine? Visit your doctor for regular check ups. This medicine will cause weakness in the muscle where it is injected. Tell your doctor if you feel unusually weak in other muscles. Get medical help right away if you have problems with breathing, swallowing, or talking. This medicine might make your eyelids droop or make you see blurry or double. If you have weak muscles or trouble seeing do not drive a car, use machinery, or do other dangerous activities. This medicine contains albumin from human blood. It may be possible to pass an infection in this medicine, but no cases have been reported. Talk to your doctor about the risks and benefits of this medicine. If your activities have been limited by your condition, go back to your regular routine slowly after treatment with this medicine. What side effects may I notice from receiving this medicine? Side effects that you should report to your doctor or health care professional as soon as possible:  allergic reactions like skin rash, itching or hives, swelling of the face, lips, or tongue  breathing problems  changes in vision  chest pain or tightness  eye irritation, pain  fast, irregular heartbeat    infection  numbness  speech problems  swallowing problems  unusual weakness Side effects that usually do not require medical attention (report to your doctor or health care professional if they continue or are bothersome):  bruising or pain at site where  injected  drooping eyelid  dry eyes or mouth  headache  muscles aches, pains  sensitivity to light  tearing This list may not describe all possible side effects. Call your doctor for medical advice about side effects. You may report side effects to FDA at 1-800-FDA-1088. Where should I keep my medicine? This drug is given in a hospital or clinic and will not be stored at home. NOTE: This sheet is a summary. It may not cover all possible information. If you have questions about this medicine, talk to your doctor, pharmacist, or health care provider.  2021 Elsevier/Gold Standard (2018-06-03 14:21:42)  

## 2020-12-30 ENCOUNTER — Encounter: Payer: Self-pay | Admitting: *Deleted

## 2021-01-05 MED FILL — CELECOXIB 200 MG CAP: 200 | 30 days supply | Qty: 60 | Fill #4

## 2021-01-05 MED FILL — TOPIRAMATE 200 MG TABLET: 200 | 30 days supply | Qty: 30 | Fill #3

## 2021-01-05 MED FILL — AMLODIPINE BESYLATE 5 MG TA: 5 | 90 days supply | Qty: 90 | Fill #3

## 2021-01-05 MED FILL — PANTOPRAZOLE SOD DR 40 MG T: 40 | 90 days supply | Qty: 90 | Fill #2

## 2021-01-06 ENCOUNTER — Encounter: Payer: Self-pay | Admitting: *Deleted

## 2021-01-20 ENCOUNTER — Encounter: Payer: Self-pay | Admitting: Emergency Medicine

## 2021-01-20 ENCOUNTER — Other Ambulatory Visit: Payer: Self-pay

## 2021-01-20 ENCOUNTER — Emergency Department (INDEPENDENT_AMBULATORY_CARE_PROVIDER_SITE_OTHER)
Admission: EM | Admit: 2021-01-20 | Discharge: 2021-01-20 | Disposition: A | Discharge status: Home / Self Care | Payer: 59 | Source: Home / Self Care | Attending: Family Medicine | Admitting: Family Medicine

## 2021-01-20 DIAGNOSIS — E861 Hypovolemia: Secondary | ICD-10-CM | POA: Diagnosis not present

## 2021-01-20 DIAGNOSIS — R531 Weakness: Secondary | ICD-10-CM

## 2021-01-20 DIAGNOSIS — R001 Bradycardia, unspecified: Secondary | ICD-10-CM

## 2021-01-20 DIAGNOSIS — I9589 Other hypotension: Secondary | ICD-10-CM | POA: Diagnosis not present

## 2021-01-20 DIAGNOSIS — E860 Dehydration: Secondary | ICD-10-CM | POA: Diagnosis not present

## 2021-01-20 LAB — POCT URINALYSIS DIP (MANUAL ENTRY)
Blood, UA: NEGATIVE
Glucose, UA: NEGATIVE mg/dL
Leukocytes, UA: NEGATIVE
Nitrite, UA: NEGATIVE
Protein Ur, POC: 30 mg/dL — AB
Spec Grav, UA: 1.02 (ref 1.010–1.025)
Urobilinogen, UA: 0.2 E.U./dL
pH, UA: 5 (ref 5.0–8.0)

## 2021-01-20 MED ORDER — SODIUM CHLORIDE 0.9 % IV BOLUS
1000.0000 mL | Freq: Once | INTRAVENOUS | Status: AC
  Administered 2021-01-20: 1000 mL via INTRAVENOUS

## 2021-01-20 NOTE — ED Triage Notes (Signed)
Weakness & fatigue today since coming to work  Pt c/o of feeling dry Denies CP  HR low 41-42, was 61 at start of shift Dr Cathren Harsh notified  Pt has had a migraine H/A last few days- seems to be resolving per pt

## 2021-01-21 NOTE — ED Provider Notes (Signed)
[facility]    CSN: [id number] Arrival date & time: 01/20/21  1306      History   Chief Complaint Chief Complaint  Patient presents with  . Weakness    HPI [name] is a 60 y.o. female.   Patient reports that she has felt excessively fatigued, weak, and light-headed with movement upon arriving to work today.  She complains of dry mouth.  She reports that she has had a persistent migraine headache during the past three days that finally resolved after using three prescribed migraine medications.  She denies chest pain, respiratory symptoms, GI and GU symptoms. 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  The history is provided by the patient.    Past Medical History:  Diagnosis Date  . Dizziness 03/05/2017  . Essential hypertension 03/05/2017  . Hyperlipidemia 03/05/2017  . Infertility, female   . Migraines     Patient Active Problem List   Diagnosis Date Noted  . Chronic migraine without aura without status migrainosus, not intractable 12/24/2020  . Trigger finger, acquired 12/15/2019  . Chronic migraine 07/05/2018  . Stress at home 11/09/2017  . Essential hypertension 03/05/2017  . Dizziness 03/05/2017  . Hyperlipidemia 03/05/2017  . Plantar fasciitis of right foot 02/02/2016  . Chronic rhinitis 10/20/2015  . Cough, persistent 10/20/2015  . Neck pain 03/16/2015  . Overweight 01/22/2015  . Anxiety 07/14/2014  . Insomnia 05/28/2012  . Migraine 05/28/2012  . Muscle spasm 05/28/2012  . HEADACHE 04/07/2009    Past Surgical History:  Procedure Laterality Date  . ANKLE SURGERY     Left  . SHOULDER SURGERY     Left    OB History    Gravida  1   Para  1   Term  1   Preterm  0   AB  0   Living  1     SAB  0   IAB  0   Ectopic  0   Multiple  0   Live Births  1            Home Medications    Prior to Admission medications   Medication Sig Start Date End Date Taking?  Authorizing Provider  amLODipine (NORVASC) 5 MG tablet Take 1 tablet (5 mg total) by mouth daily. 03/24/20   Chilton Si, MD  Ascorbic Acid (VITAMIN C PO) Take by mouth.    [provider]  aspirin EC 81 MG tablet Take 81 mg by mouth daily.    [provider]  baclofen (LIORESAL) 10 MG tablet Take 1 tablet (10 mg total) by mouth every 8 (eight) hours as needed. 12/24/20   Teague Chestine Spore, Scot Jun, PA-C  BOTOX 100 units SOLR injection Inject 100 Units into the muscle every 3 (three) months. 12/24/20   Glyn Ade, Scot Jun, PA-C  Calcium Carbonate (CALCIUM 600 PO) Take by mouth.    [provider]  celecoxib (CELEBREX) 200 MG capsule TAKE 1 CAPSULE BY MOUTH 2 TIMES DAILY. 06/15/20   Glyn Ade, Scot Jun, PA-C  desonide (DESOWEN) 0.05 % ointment  06/24/18   [provider]  Fluticasone Propionate (XHANCE) 93 MCG/ACT EXHU Place 2 sprays into the nose 2 (two) times daily. 09/18/17   Bobbitt, Heywood Iles, MD  Galcanezumab-gnlm (EMGALITY) 120 MG/ML SOAJ Inject 240 mg into the skin as directed AND 120 mg every 30 (thirty) days. Inj 240mg  once then 120mg  monthly. 05/14/20   , 07/14/20  E, PA-C  hydrOXYzine (ATARAX/VISTARIL) 50 MG tablet TAKE 2 TABLETS BY MOUTH 3 TIMES DAILY AS NEEDED FOR ITCHING. 08/23/18   Zola Button, Grayling Congress, DO  hydrOXYzine (VISTARIL) 50 MG capsule TAKE 2 CAPSULES BY MOUTH THREE TIMES DAILY AS NEEDED FOR ITCHING 12/01/20   Zola Button, Grayling Congress, DO  ibuprofen (ADVIL) 800 MG tablet Take 1 tablet (800 mg total) by mouth every 8 (eight) hours as needed. 01/09/20   Glyn Ade, Scot Jun, PA-C  Insulin Pen Needle 31G X 6 MM MISC As directed 07/05/20   Zola Button, Grayling Congress, DO  Liraglutide -Weight Management (SAXENDA) 18 MG/3ML SOPN Inject 0.5 mLs (3 mg total) into the skin daily. 07/05/20   Seabron Spates R, DO  LORazepam (ATIVAN) 0.5 MG tablet 1-2 tabs 30 - 60 min prior to MRI. Do not drive with this medicine. Patient not taking: Reported on  12/24/2020 02/26/20   Rodolph Bong, MD  magnesium oxide (MAG-OX) 400 MG tablet Take 400 mg by mouth daily.    [provider]  Multiple Vitamins-Minerals (ZINC PO) Take by mouth.    [provider]  naratriptan (AMERGE) 2.5 MG tablet TAKE 1 TABLET BY MOUTH AT ONSET OF HEADACHE. IF RETURNS OR DOES NOT RESOLVE MAY REPEAT AFTER 4 HOURS. DO NOT EXCEED 2 TABLETS IN 24 HOURS 10/15/20   Glyn Ade, Scot Jun, PA-C  NONFORMULARY OR COMPOUNDED ITEM Gaba Calm as needed for anxiety 05/20/18   Zola Button, Myrene Buddy R, DO  ondansetron (ZOFRAN) 8 MG tablet Take 1 tablet (8 mg total) by mouth every 8 (eight) hours as needed. 12/24/20   Glyn Ade, Scot Jun, PA-C  pantoprazole (PROTONIX) 40 MG tablet Take 1 tablet (40 mg total) by mouth daily. NEEDS OV/FOLLOW UP BEFORE ANY MORE REFILLS 07/05/20   Seabron Spates R, DO  Probiotic Product (PROBIOTIC DAILY PO) Take by mouth.    [provider]  Pseudoephedrine-DM-GG (SUDAFED COUGH PO) Take by mouth.    [provider]  RESTASIS 0.05 % ophthalmic emulsion Place 1 drop into both eyes 2 (two) times daily. 06/30/20   [provider]  Rimegepant Sulfate (NURTEC) 75 MG TBDP Take 1 tablet by mouth as needed. 05/14/20   Glyn Ade, Scot Jun, PA-C  Rimegepant Sulfate (NURTEC) 75 MG TBDP Take 75 mg by mouth every other day. 12/24/20   Glyn Ade, Scot Jun, PA-C  rosuvastatin (CRESTOR) 40 MG tablet Take 1 tablet (40 mg total) by mouth daily. 03/24/20   Chilton Si, MD  sertraline (ZOLOFT) 100 MG tablet TAKE 1 AND 1/2 TABLETS (150 MG TOTAL) BY MOUTH DAILY. 12/01/20   Donato Schultz, DO  tiZANidine (ZANAFLEX) 4 MG tablet Take 1 tablet (4 mg total) by mouth 3 (three) times daily. 12/24/20   Glyn Ade, Scot Jun, PA-C  topiramate (TOPAMAX) 200 MG tablet Take 1 tablet (200 mg total) by mouth daily. 05/14/20   Glyn Ade, Scot Jun, PA-C  traMADol (ULTRAM) 50 MG tablet Take 1 tablet (50 mg total) by mouth every 6 (six) hours as needed  for severe pain. 12/24/20   Glyn Ade, Scot Jun, PA-C  traZODone (DESYREL) 50 MG tablet TAKE 1/2 TO 1 TABLET BY MOUTH AT BEDTIME AS NEEDED FOR SLEEP (NEED OFFICE VISIT BEFORE ANY MORE REFILLS) 07/05/20   Zola Button, Grayling Congress, DO  Ubrogepant (UBRELVY) 100 MG TABS Take 100 mg by mouth as needed. 01/09/20   Teague Chestine Spore, Scot Jun, PA-C  UNABLE TO FIND CBD 50 mg  [provider]  vitamin B-12 (CYANOCOBALAMIN) 500 MCG tablet Take 500 mcg by mouth daily.    [provider]  VITAMIN D PO Take by mouth.    [provider]    Family History Family History  Problem Relation Age of Onset  . Hypertension Mother   . Allergic rhinitis Mother   . Sleep apnea Mother   . Hyperlipidemia Mother   . Cancer Maternal Grandmother   . Stroke Other   . Eczema Daughter   . Alcohol abuse Father   . Stroke Father   . Heart disease Maternal Grandfather   . Alcohol abuse Paternal Grandfather     Social History Social History   Tobacco Use  . Smoking status: Former Smoker    Types: Cigarettes    Quit date: 02/09/1992    Years since quitting: 28.9  . Smokeless tobacco: Never Used  Vaping Use  . Vaping Use: Never used  Substance Use Topics  . Alcohol use: Yes    Comment: 1-2 weekly  . Drug use: No     Allergies   Augmentin [amoxicillin-pot clavulanate]   Review of Systems Review of Systems No sore throat No cough No pleuritic pain No wheezing No nasal congestion No post-nasal drainage No sinus pain/pressure No itchy/red eyes No earache No hemoptysis No SOB No fever/chills No nausea No vomiting No abdominal pain No diarrhea No urinary symptoms No skin rash + fatigue + light-headedness No myalgias No headache   Physical Exam Triage Vital Signs ED Triage Vitals  Enc Vitals Group     BP 01/20/21 1316 (!) 74/56     Pulse Rate 01/20/21 1316 (!) 41     Resp 01/20/21 1316 15     Temp 01/20/21 1316 (!) 97.3 F (36.3 C)     Temp Source 01/20/21 1316  Tympanic     SpO2 01/20/21 1316 100 %     Weight 01/20/21 1322 169 lb 12.1 oz (77 kg)     Height 01/20/21 1322 5\' 4"  (1.626 m)     Head Circumference --      Peak Flow --      Pain Score 01/20/21 1321 3     Pain Loc --      Pain Edu? --      Excl. in GC? --    No data found.  Updated Vital Signs BP 99/63 (BP Location: Left Arm)   Pulse 73   Temp (!) 97.3 F (36.3 C) (Tympanic)   Resp 15   Ht 5\' 4"  (1.626 m)   Wt 77 kg   SpO2 98%   BMI 29.14 kg/m   Visual Acuity Right Eye Distance:   Left Eye Distance:   Bilateral Distance:    Right Eye Near:   Left Eye Near:    Bilateral Near:     Physical Exam Nursing notes and Vital Signs reviewed. Appearance:  Patient appears stated age, and in no acute distress.    Eyes:  Pupils are equal, round, and reactive to light and accomodation.  Extraocular movement is intact.  Conjunctivae are not inflamed   Pharynx:  Normal; moist mucous membranes  Neck:  No adenopathy Lungs:  Clear to auscultation.  Breath sounds are equal.  Moving air well. Heart:  Regular rate and rhythm without murmurs, rubs, or gallops.  Rate less than 60. Abdomen:  Nontender Extremities:  No edema.  Skin:  No rash present.     UC Treatments / Results  Labs (all labs ordered  are listed, but only abnormal results are displayed) Labs Reviewed  POCT URINALYSIS DIP (MANUAL ENTRY) - Abnormal; Notable for the following components:      Result Value   Bilirubin, UA moderate (*)    Ketones, POC UA small (15) (*)    Protein Ur, POC =30 (*)    All other components within normal limits    EKG  Rate:  39 BPM PR:  144 msec QT:  572 msec QTcH:  460 msec QRSD:  90 msec QRS axis:  11 degrees Interpretation:  Marked sinus bradycardia; otherwise no acute changes.  Review of previous records:  Today's EKG similar to EKG performed 24 March 2020 except for bradycardia today.  Radiology No results found.  Procedures Procedures IV fluids administered:  1 liter NS  bolus.  Medications Ordered in UC Medications  sodium chloride 0.9 % bolus 1,000 mL (0 mLs Intravenous Stopped 01/20/21 1540)    Initial Impression / Assessment and Plan / UC Course  I have reviewed the triage vital signs and the nursing notes.  Pertinent labs & imaging results that were available during my care of the patient were reviewed by me and considered in my medical decision making (see chart for details).     Patient's heart rate and blood pressure normalized after IV hydration.  Her weakness and fatigue also resolved. Recommend that she follow-up with her PCP if not feeling well in 4 days.  Final Clinical Impressions(s) / UC Diagnoses   Final diagnoses:  Generalized weakness  Hypotension due to hypovolemia  Dehydration  Bradycardia     Discharge Instructions     Rest.  Maintain increased fluid intake.  If symptoms become significantly worse during the night or over the weekend, proceed to the local emergency room.     ED Prescriptions    None        Lattie Haw, MD 01/21/21 2023

## 2021-01-21 NOTE — Discharge Instructions (Signed)
Rest.  Maintain increased fluid intake.  If symptoms become significantly worse during the night or over the weekend, proceed to the local emergency room.

## 2021-01-25 IMAGING — MR MR KNEE*R* W/O CM
7 series · 40 of 40 positions shown · non-contrast
Comparison: None.

CLINICAL DATA: Status post fall with pain medially and laterally.

EXAM:
MRI OF THE RIGHT KNEE WITHOUT CONTRAST
TECHNIQUE: Multiplanar, multisequence MR imaging of the knee was performed. No
intravenous contrast was administered.

[Series 6: T2 fat-sat · axial · 4.0mm · 0.53mm/px · z∈[-79,+71]mm · 6 of 31 slices shown (1 of 3)]
[im 1/31]
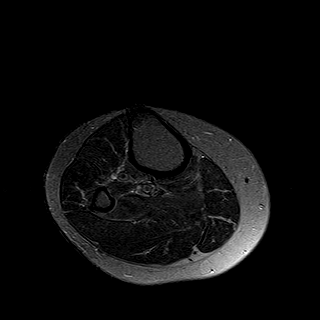
[im 7/31]
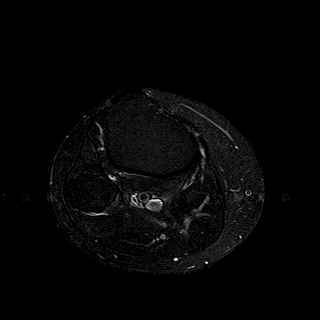
[im 13/31]
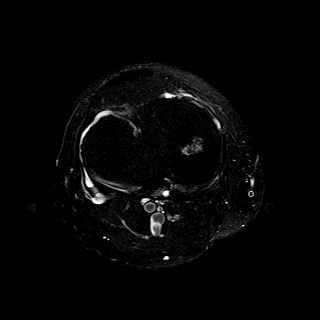
[im 19/31]
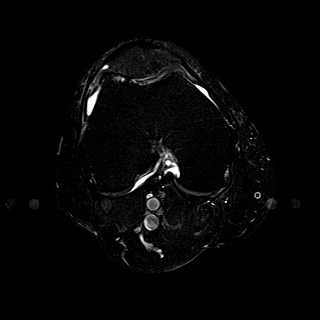
[im 25/31]
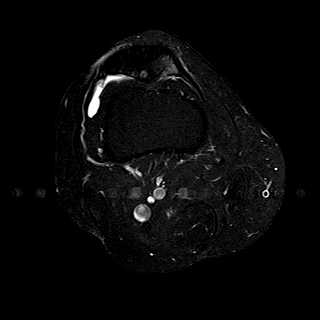
[im 31/31]
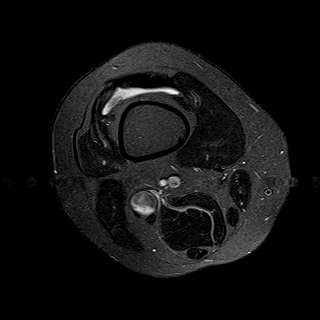

[Series 7: T1 · coronal · 4.0mm · 0.62mm/px · 6 of 27 slices shown]
[im 1/27]
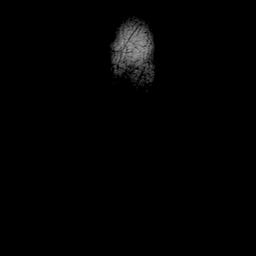
[im 6/27]
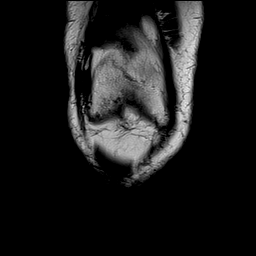
[im 11/27]
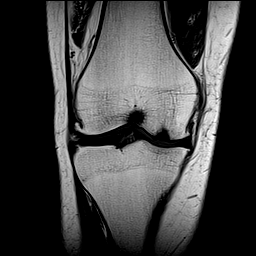
[im 16/27]
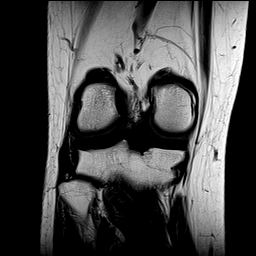
[im 21/27]
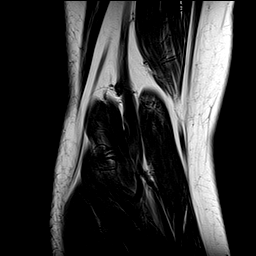
[im 27/27]
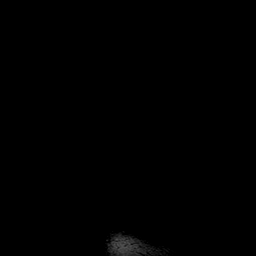

[Series 8: T2 fat-sat · coronal · 4.0mm · 0.62mm/px · 6 of 27 slices shown (2 of 3)]
[im 1/27]
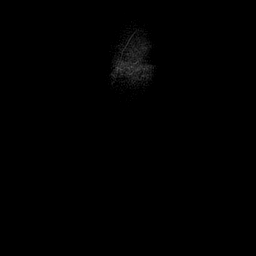
[im 6/27]
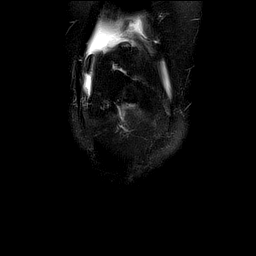
[im 11/27]
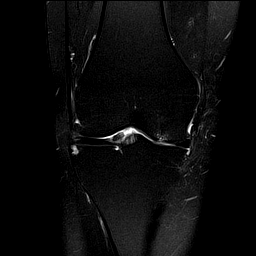
[im 16/27]
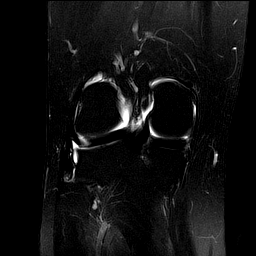
[im 21/27]
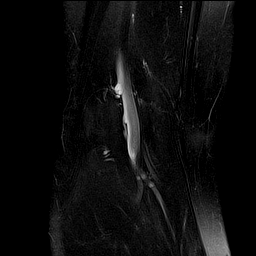
[im 27/27]
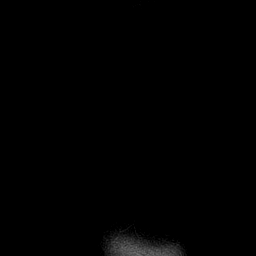

[Series 9: PD fat-sat · coronal · 4.0mm · 0.62mm/px · 6 of 27 slices shown (1 of 3)]
[im 1/27]
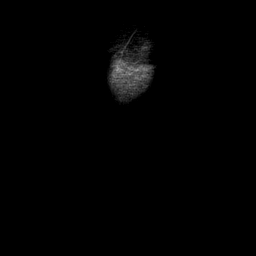
[im 6/27]
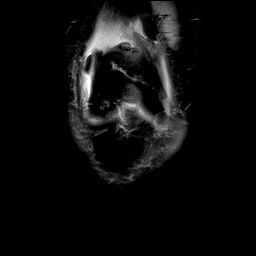
[im 11/27]
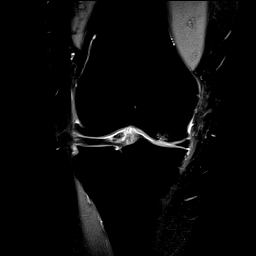
[im 16/27]
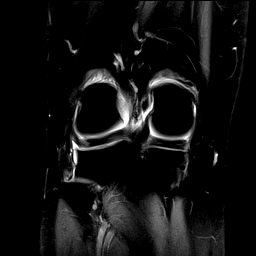
[im 21/27]
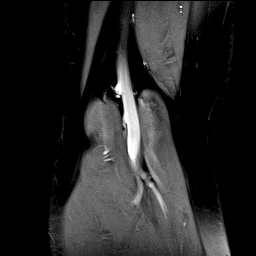
[im 27/27]
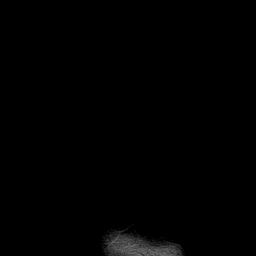

[Series 10: PD fat-sat · sagittal · 3.0mm · 0.62mm/px · 6 of 30 slices shown (2 of 3)]
[im 1/30]
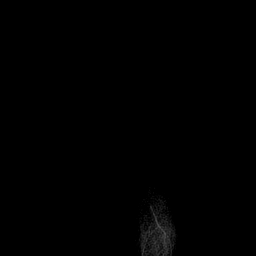
[im 6/30]
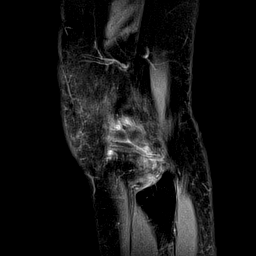
[im 12/30]
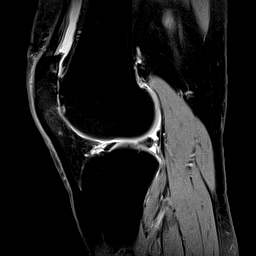
[im 18/30]
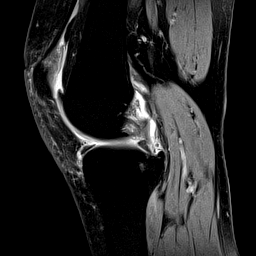
[im 24/30]
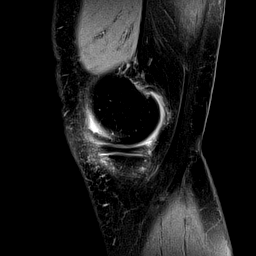
[im 30/30]
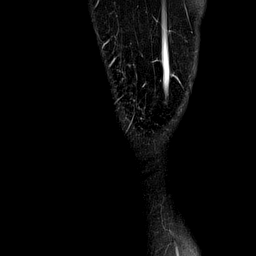

[Series 11: T2 fat-sat · sagittal · 3.0mm · 0.62mm/px · 6 of 30 slices shown (3 of 3)]
[im 1/30]
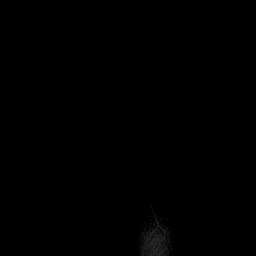
[im 6/30]
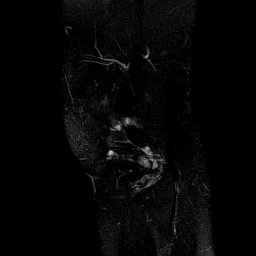
[im 12/30]
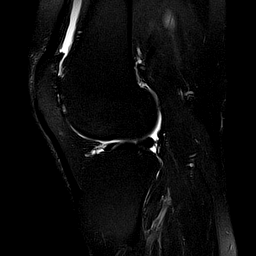
[im 18/30]
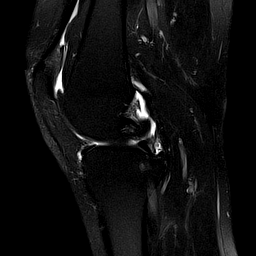
[im 24/30]
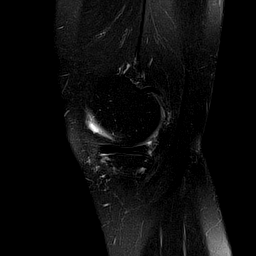
[im 30/30]
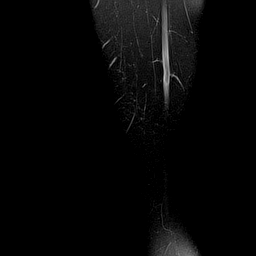

[Series 12: PD fat-sat · coronal · 2.0mm · 0.62mm/px · 4 of 19 slices shown (3 of 3)]
[im 1/19]
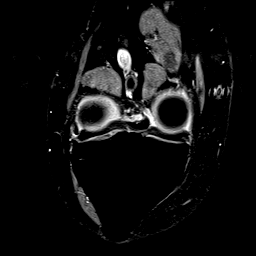
[im 7/19]
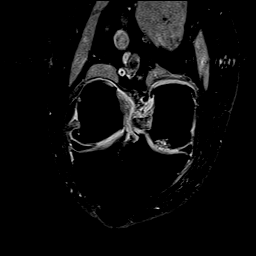
[im 13/19]
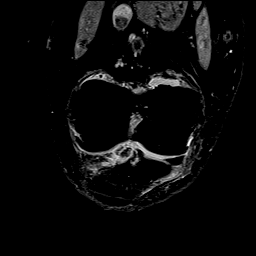
[im 19/19]
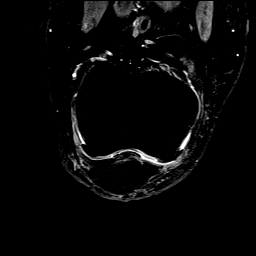

[40 of 40 positions shown; findings below may reference images not displayed]

FINDINGS: MENISCI

Medial meniscus: Small undersurface tear of the posterior horn of
medial meniscus extending to the inferior articular surface (image
22/23 series 10). Degeneration of the posterior horn of the medial
meniscus at the meniscal root.

Lateral meniscus:  Intact.

LIGAMENTS

Cruciates:  Intact ACL and PCL.

Collaterals: Medial collateral ligament is intact. Lateral
collateral ligament complex is intact.

CARTILAGE

Patellofemoral: Full-thickness cartilage loss of the lateral
patellofemoral compartment with mild subchondral reactive marrow
changes and marginal osteophytes.

Medial: Partial-thickness cartilage loss of the medial femorotibial
compartment with small areas of full-thickness cartilage loss in the
medial femoral condyle and subchondral reactive marrow changes.

Lateral:  Mild chondral thinning.

Joint: Small joint effusion. Normal Hoffa's fat. No plical
thickening.

Popliteal Fossa:  No Baker cyst. Intact popliteus tendon.

Extensor Mechanism: Intact quadriceps tendon. Intact patellar
tendon. Intact medial patellar retinaculum. Intact lateral patellar
retinaculum. Intact MPFL.

Bones:  No acute osseous abnormality. No aggressive osseous lesion.

Other: No fluid collection or hematoma. Muscles are normal.
IMPRESSION: 1. Small undersurface tear of the posterior horn of medial meniscus
extending to the inferior articular surface. Degeneration of the
posterior horn of the medial meniscus at the meniscal root.
2. Tricompartmental cartilage abnormalities as described above.

## 2021-01-26 ENCOUNTER — Ambulatory Visit: Payer: 59 | Admitting: General Practice

## 2021-01-28 ENCOUNTER — Encounter: Payer: Self-pay | Admitting: *Deleted

## 2021-01-28 ENCOUNTER — Ambulatory Visit: Payer: 59 | Admitting: Cardiovascular Disease

## 2021-01-31 ENCOUNTER — Telehealth: Payer: Self-pay | Admitting: Radiology

## 2021-01-31 NOTE — Telephone Encounter (Signed)
Left message for patient to call [facility] to schedule April headache appointment. Mychart message sent also.

## 2021-02-15 ENCOUNTER — Other Ambulatory Visit: Payer: Self-pay | Admitting: Family Medicine

## 2021-02-15 DIAGNOSIS — F419 Anxiety disorder, unspecified: Secondary | ICD-10-CM

## 2021-02-15 MED FILL — TOPIRAMATE 200 MG TABLET: 200 | 30 days supply | Qty: 30 | Fill #4

## 2021-02-15 MED FILL — CELECOXIB 200 MG CAP: 200 | 30 days supply | Qty: 60 | Fill #5

## 2021-02-15 MED FILL — HYDROXYZINE PAMOATE 50 MG C: 50 | 10 days supply | Qty: 60 | Fill #0

## 2021-02-15 MED FILL — SERTRALINE HCL 100 MG TABS: 100 | 30 days supply | Qty: 45 | Fill #0

## 2021-02-17 ENCOUNTER — Telehealth: Payer: Self-pay

## 2021-02-17 DIAGNOSIS — M9904 Segmental and somatic dysfunction of sacral region: Secondary | ICD-10-CM | POA: Diagnosis not present

## 2021-02-17 DIAGNOSIS — M9903 Segmental and somatic dysfunction of lumbar region: Secondary | ICD-10-CM | POA: Diagnosis not present

## 2021-02-17 DIAGNOSIS — M25511 Pain in right shoulder: Secondary | ICD-10-CM | POA: Diagnosis not present

## 2021-02-17 DIAGNOSIS — M25551 Pain in right hip: Secondary | ICD-10-CM | POA: Diagnosis not present

## 2021-02-17 NOTE — Telephone Encounter (Signed)
Received fax from pharmacy to renew Sertraline 100 mg. Pt has not been seen since 06/2020, was due to f/u in 09/2020 but has not. Okay to decline?

## 2021-02-17 NOTE — Telephone Encounter (Signed)
Ok to send 30 in with no refills but pt needs ov

## 2021-02-21 NOTE — Telephone Encounter (Signed)
Left a vm to schedule an appointment.

## 2021-02-25 ENCOUNTER — Other Ambulatory Visit: Payer: Self-pay

## 2021-02-25 ENCOUNTER — Other Ambulatory Visit: Payer: Self-pay | Admitting: Family Medicine

## 2021-02-25 ENCOUNTER — Ambulatory Visit: Payer: 59 | Admitting: Family Medicine

## 2021-02-25 ENCOUNTER — Encounter: Payer: Self-pay | Admitting: Family Medicine

## 2021-02-25 VITALS — BP 100/68 | HR 77 | Temp 97.7°F | Resp 18 | Ht 64.0 in | Wt 172.2 lb

## 2021-02-25 DIAGNOSIS — E785 Hyperlipidemia, unspecified: Secondary | ICD-10-CM

## 2021-02-25 DIAGNOSIS — E2839 Other primary ovarian failure: Secondary | ICD-10-CM

## 2021-02-25 DIAGNOSIS — Z1159 Encounter for screening for other viral diseases: Secondary | ICD-10-CM | POA: Diagnosis not present

## 2021-02-25 DIAGNOSIS — R3989 Other symptoms and signs involving the genitourinary system: Secondary | ICD-10-CM | POA: Diagnosis not present

## 2021-02-25 DIAGNOSIS — G43709 Chronic migraine without aura, not intractable, without status migrainosus: Secondary | ICD-10-CM

## 2021-02-25 DIAGNOSIS — K219 Gastro-esophageal reflux disease without esophagitis: Secondary | ICD-10-CM | POA: Diagnosis not present

## 2021-02-25 DIAGNOSIS — Z1211 Encounter for screening for malignant neoplasm of colon: Secondary | ICD-10-CM

## 2021-02-25 DIAGNOSIS — F418 Other specified anxiety disorders: Secondary | ICD-10-CM

## 2021-02-25 DIAGNOSIS — G4700 Insomnia, unspecified: Secondary | ICD-10-CM

## 2021-02-25 DIAGNOSIS — I10 Essential (primary) hypertension: Secondary | ICD-10-CM | POA: Diagnosis not present

## 2021-02-25 LAB — POCT URINALYSIS DIPSTICK
Bilirubin, UA: NEGATIVE
Blood, UA: NEGATIVE
Glucose, UA: NEGATIVE
Ketones, UA: NEGATIVE
Leukocytes, UA: NEGATIVE
Nitrite, UA: NEGATIVE
Protein, UA: POSITIVE — AB
Spec Grav, UA: 1.015 (ref 1.010–1.025)
Urobilinogen, UA: NEGATIVE E.U./dL — AB
pH, UA: 6.5 (ref 5.0–8.0)

## 2021-02-25 LAB — CBC WITH DIFFERENTIAL/PLATELET
Basophils Absolute: 0 10*3/uL (ref 0.0–0.1)
Basophils Relative: 0.7 % (ref 0.0–3.0)
Eosinophils Absolute: 0.3 10*3/uL (ref 0.0–0.7)
Eosinophils Relative: 3.5 % (ref 0.0–5.0)
HCT: 36.4 % (ref 36.0–46.0)
Hemoglobin: 12.8 g/dL (ref 12.0–15.0)
Lymphocytes Relative: 36 % (ref 12.0–46.0)
Lymphs Abs: 2.7 10*3/uL (ref 0.7–4.0)
MCHC: 35.2 g/dL (ref 30.0–36.0)
MCV: 91.7 fl (ref 78.0–100.0)
Monocytes Absolute: 0.6 10*3/uL (ref 0.1–1.0)
Monocytes Relative: 8.5 % (ref 3.0–12.0)
Neutro Abs: 3.8 10*3/uL (ref 1.4–7.7)
Neutrophils Relative %: 51.3 % (ref 43.0–77.0)
Platelets: 316 10*3/uL (ref 150.0–400.0)
RBC: 3.97 Mil/uL (ref 3.87–5.11)
RDW: 13.1 % (ref 11.5–15.5)
WBC: 7.4 10*3/uL (ref 4.0–10.5)

## 2021-02-25 LAB — COMPREHENSIVE METABOLIC PANEL
ALT: 49 U/L — ABNORMAL HIGH (ref 0–35)
AST: 40 U/L — ABNORMAL HIGH (ref 0–37)
Albumin: 4.3 g/dL (ref 3.5–5.2)
Alkaline Phosphatase: 66 U/L (ref 39–117)
BUN: 15 mg/dL (ref 6–23)
CO2: 27 mEq/L (ref 19–32)
Calcium: 9.3 mg/dL (ref 8.4–10.5)
Chloride: 103 mEq/L (ref 96–112)
Creatinine, Ser: 0.85 mg/dL (ref 0.40–1.20)
GFR: 73.78 mL/min (ref >60.00)
Glucose, Bld: 99 mg/dL (ref 70–99)
Potassium: 3.9 mEq/L (ref 3.5–5.1)
Sodium: 139 mEq/L (ref 135–145)
Total Bilirubin: 0.4 mg/dL (ref 0.2–1.2)
Total Protein: 6.7 g/dL (ref 6.0–8.3)

## 2021-02-25 LAB — LIPID PANEL
Cholesterol: 141 mg/dL (ref 0–200)
HDL: 67.1 mg/dL (ref >39.00)
LDL Cholesterol: 53 mg/dL (ref 0–99)
NonHDL: 74.31
Total CHOL/HDL Ratio: 2
Triglycerides: 105 mg/dL (ref 0.0–149.0)
VLDL: 21 mg/dL (ref 0.0–40.0)

## 2021-02-25 MED ORDER — TRAZODONE HCL 50 MG PO TABS: ORAL_TABLET | ORAL | 0 refills | Status: DC

## 2021-02-25 MED ORDER — HYDROXYZINE PAMOATE 50 MG PO CAPS: 100.0000 mg | ORAL_CAPSULE | Freq: Three times a day (TID) | ORAL | 0 refills | Status: DC | PRN

## 2021-02-25 MED ORDER — PANTOPRAZOLE SODIUM 40 MG PO TBEC: 40.0000 mg | DELAYED_RELEASE_TABLET | Freq: Every day | ORAL | 3 refills | Status: DC

## 2021-02-25 NOTE — Progress Notes (Signed)
Patient ID: [name], female    DOB: 02-05-1961  Age: 60 y.o. MRN: [id number]    Subjective:  Subjective  HPI [name] presents for an office visit today. She complains today of urinary frequency x 2 days.   She reports that she has been drinking large amounts of water but the urine out put has been small. She denies any burning sensation while urinating.  She has been experiencing sleep disturbance and hot flashes at night. She usually takes Trazodone to treat her insomina symptoms however she recently ran out of this medication. She has tried white noise meditation in the past, it help her in the beginning but now she stays up to 4 hours into the meditation before falling asleep. The patient as also been taking Saxenda for weight loss. She discontinued this medication due to vomiting and nausea. She is requesting for an alternative medication.   She has a past medical hx of migraines. She notes having a single episode of low bp and dehydration after taking a high dose of Nurtec a month ago. She receive botox injections for her migraines frequently. She is currently having a HA at this time. She denies any SOB, cough, fever, fatigue, chills, abdominal pain, or vaginal bleeding, discharge, and pain at this time.   Review of Systems  Constitutional: Negative for chills, fatigue and fever.  HENT: Negative for ear pain, sinus pain and sore throat.   Eyes: Negative for pain.  Respiratory: Negative for cough and shortness of breath.   Cardiovascular: Negative for chest pain.  Gastrointestinal: Positive for nausea and vomiting. Negative for abdominal pain and diarrhea.  Genitourinary: Positive for decreased urine volume and frequency. Negative for dysuria, hematuria, vaginal bleeding, vaginal discharge and vaginal pain.  Musculoskeletal: Negative for back pain.  Skin: Negative for rash.  Neurological: Negative for dizziness.  Psychiatric/Behavioral: Positive for sleep disturbance.     History Past Medical History:  Diagnosis Date  . Dizziness 03/05/2017  . Essential hypertension 03/05/2017  . Hyperlipidemia 03/05/2017  . Infertility, female   . Migraines     She has a past surgical history that includes Shoulder surgery and Ankle surgery.   Her family history includes Alcohol abuse in her father and paternal grandfather; Allergic rhinitis in her mother; Cancer in her maternal grandmother; Eczema in her daughter; Heart disease in her maternal grandfather; Hyperlipidemia in her mother; Hypertension in her mother; Sleep apnea in her mother; Stroke in her father and another family member.She reports that she quit smoking about 29 years ago. Her smoking use included cigarettes. She has never used smokeless tobacco. She reports current alcohol use. She reports that she does not use drugs.  Current Outpatient Medications on File Prior to Visit  Medication Sig Dispense Refill  . amLODipine (NORVASC) 5 MG tablet Take 1 tablet (5 mg total) by mouth daily. 90 tablet 3  . Ascorbic Acid (VITAMIN C PO) Take by mouth.    Marland Kitchen aspirin EC 81 MG tablet Take 81 mg by mouth daily.    . baclofen (LIORESAL) 10 MG tablet Take 1 tablet (10 mg total) by mouth every 8 (eight) hours as needed. 60 tablet 3  . BOTOX 100 units SOLR injection Inject 100 Units into the muscle every 3 (three) months. 1 each 3  . Calcium Carbonate (CALCIUM 600 PO) Take by mouth.    . celecoxib (CELEBREX) 200 MG capsule TAKE 1 CAPSULE BY MOUTH 2 TIMES DAILY. 60 capsule 11  . Fluticasone Propionate (XHANCE) 93  MCG/ACT EXHU Place 2 sprays into the nose 2 (two) times daily. 32 mL 5  . Galcanezumab-gnlm (EMGALITY) 120 MG/ML SOAJ Inject 240 mg into the skin as directed AND 120 mg every 30 (thirty) days. Inj 240mg  once then 120mg  monthly. 1 pen 11  . ibuprofen (ADVIL) 800 MG tablet Take 1 tablet (800 mg total) by mouth every 8 (eight) hours as needed. 60 tablet 3  . magnesium oxide (MAG-OX) 400 MG tablet Take 400 mg by mouth  daily.    . Multiple Vitamins-Minerals (ZINC PO) Take by mouth.    . naratriptan (AMERGE) 2.5 MG tablet TAKE 1 TABLET BY MOUTH AT ONSET OF HEADACHE. IF RETURNS OR DOES NOT RESOLVE MAY REPEAT AFTER 4 HOURS. DO NOT EXCEED 2 TABLETS IN 24 HOURS 10 tablet 11  . NONFORMULARY OR COMPOUNDED ITEM Gaba Calm as needed for anxiety    . ondansetron (ZOFRAN) 8 MG tablet Take 1 tablet (8 mg total) by mouth every 8 (eight) hours as needed. 20 tablet 3  . Probiotic Product (PROBIOTIC DAILY PO) Take by mouth.    . RESTASIS 0.05 % ophthalmic emulsion Place 1 drop into both eyes 2 (two) times daily.    . Rimegepant Sulfate (NURTEC) 75 MG TBDP Take 1 tablet by mouth as needed. 8 tablet 11  . Rimegepant Sulfate (NURTEC) 75 MG TBDP Take 75 mg by mouth every other day. 16 tablet 11  . rosuvastatin (CRESTOR) 40 MG tablet Take 1 tablet (40 mg total) by mouth daily. 90 tablet 3  . sertraline (ZOLOFT) 100 MG tablet Take 1.5 tablets (150 mg total) by mouth daily. 45 tablet 0  . tiZANidine (ZANAFLEX) 4 MG tablet Take 1 tablet (4 mg total) by mouth 3 (three) times daily. 90 tablet 2  . topiramate (TOPAMAX) 200 MG tablet Take 1 tablet (200 mg total) by mouth daily. 30 tablet 5  . traMADol (ULTRAM) 50 MG tablet Take 1 tablet (50 mg total) by mouth every 6 (six) hours as needed for severe pain. 10 tablet 0  . Ubrogepant (UBRELVY) 100 MG TABS Take 100 mg by mouth as needed. 8 tablet 11  . UNABLE TO FIND CBD 50 mg    . vitamin B-12 (CYANOCOBALAMIN) 500 MCG tablet Take 500 mcg by mouth daily.    VITAMIN D PO Take by mouth.     No current facility-administered medications on file prior to visit.     Objective:  Objective  Physical Exam Vitals and nursing note reviewed.  Constitutional:      General: She is not in acute distress.    Appearance: Normal appearance. She is well-developed. She is not ill-appearing.  HENT:     Head: Normocephalic and atraumatic.     Right Ear: External ear normal.     Left Ear: External ear  normal.     Nose: Nose normal.  Eyes:     General:        Right eye: No discharge.        Left eye: No discharge.     Extraocular Movements: Extraocular movements intact.     Pupils: Pupils are equal, round, and reactive to light.  Cardiovascular:     Rate and Rhythm: Normal rate and regular rhythm.     Pulses: Normal pulses.     Heart sounds: Normal heart sounds. No murmur heard.   Pulmonary:     Effort: Pulmonary effort is normal. No respiratory distress.     Breath sounds: Normal breath sounds. No wheezing,  rhonchi or rales.  Abdominal:     General: Bowel sounds are normal.     Palpations: Abdomen is soft. There is no mass.     Tenderness: There is no abdominal tenderness. There is no guarding or rebound.  Musculoskeletal:     Cervical back: Normal range of motion and neck supple.  Skin:    General: Skin is warm and dry.  Neurological:     Mental Status: She is alert and oriented to person, place, and time.  Psychiatric:        Behavior: Behavior normal.    BP 100/68 (BP Location: Left Arm, Patient Position: Sitting, Cuff Size: Normal)   Pulse 77   Temp 97.7 F (36.5 C) (Oral)   Resp 18   Ht 5\' 4"  (1.626 m)   Wt 172 lb 3.2 oz (78.1 kg)   SpO2 100%   BMI 29.56 kg/m  Wt Readings from Last 3 Encounters:  02/25/21 172 lb 3.2 oz (78.1 kg)  01/20/21 169 lb 12.1 oz (77 kg)  07/05/20 169 lb 12.8 oz (77 kg)     Lab Results  Component Value Date   WBC 7.6 11/26/2018   HGB 12.1 11/26/2018   HCT 35.0 11/26/2018   PLT 287 11/26/2018   GLUCOSE 87 03/24/2020   CHOL 189 03/24/2020   TRIG 193 (H) 03/24/2020   HDL 75 03/24/2020   LDLCALC 82 03/24/2020   ALT 35 (H) 03/24/2020   AST 26 03/24/2020   NA 142 03/24/2020   K 4.1 03/24/2020   CL 105 03/24/2020   CREATININE 0.72 03/24/2020   BUN 19 03/24/2020   CO2 22 03/24/2020   TSH 1.600 11/26/2018   HGBA1C 5.5 04/16/2015    No results found.   Assessment & Plan:  Plan    Meds ordered this encounter   Medications  . traZODone (DESYREL) 50 MG tablet    Sig: TAKE 1/2 TO 1 TABLET BY MOUTH AT BEDTIME AS NEEDED FOR SLEEP    Dispense:  30 tablet    Refill:  0  . hydrOXYzine (VISTARIL) 50 MG capsule    Sig: Take 2 capsules (100 mg total) by mouth 3 (three) times daily as needed for itching.    Dispense:  60 capsule    Refill:  0  . pantoprazole (PROTONIX) 40 MG tablet    Sig: Take 1 tablet (40 mg total) by mouth daily.    Dispense:  90 tablet    Refill:  3    Problem List Items Addressed This Visit      Unprioritized   Chronic migraine without aura without status migrainosus, not intractable    Per neuro      Relevant Medications   traZODone (DESYREL) 50 MG tablet   Depression with anxiety    Stable  con't zoloft  Takes hydroxyzine during day prn and trazadone at night       Relevant Medications   traZODone (DESYREL) 50 MG tablet   hydrOXYzine (VISTARIL) 50 MG capsule   Essential hypertension    Well controlled, no changes to meds. Encouraged heart healthy diet such as the DASH diet and exercise as tolerated.       Hyperlipidemia   Relevant Orders   Lipid panel   CBC with Differential/Platelet   Comprehensive metabolic panel   Insomnia    con't trazadone prn       Relevant Medications   traZODone (DESYREL) 50 MG tablet    Other Visit Diagnoses    Dark yellow-colored  urine    -  Primary   Relevant Orders   POCT Urinalysis Dipstick (Completed)   Urine Culture   Gastroesophageal reflux disease       Relevant Medications   pantoprazole (PROTONIX) 40 MG tablet   Primary hypertension       Relevant Orders   Lipid panel   CBC with Differential/Platelet   Comprehensive metabolic panel   Colon cancer screening       Relevant Orders   Ambulatory referral to Gastroenterology   Estrogen deficiency       Relevant Orders   DG Bone Density   Need for hepatitis C screening test       Relevant Orders   Hepatitis C antibody      Follow-up: Return in about 6  months (around 08/28/2021), or if symptoms worsen or fail to improve, for annual exam, fasting.   I,Alexis Bryant,acting as a Neurosurgeonscribe for Fisher ScientificYvonne R Lowne Chase, DO.,have documented all relevant documentation on the behalf of Donato SchultzYvonne R Lowne Chase, DO,as directed by  Donato SchultzYvonne R Lowne Chase, DO while in the presence of Donato SchultzYvonne R Lowne Chase, DO.  I, Donato SchultzYvonne R Lowne Chase, DO, have reviewed all documentation for this visit. The documentation on 02/25/21 for the exam, diagnosis, procedures, and orders are all accurate and complete.

## 2021-02-25 NOTE — Assessment & Plan Note (Signed)
Well controlled, no changes to meds. Encouraged heart healthy diet such as the DASH diet and exercise as tolerated.  °

## 2021-02-25 NOTE — Assessment & Plan Note (Signed)
Stable  con't zoloft  Takes hydroxyzine during day prn and trazadone at night

## 2021-02-25 NOTE — Assessment & Plan Note (Signed)
con't trazadone prn  

## 2021-02-25 NOTE — Assessment & Plan Note (Signed)
Per neuro 

## 2021-02-25 NOTE — Patient Instructions (Signed)
[email or web address].NIH.Gov">  Generalized Anxiety Disorder, Adult Generalized anxiety disorder (GAD) is a mental health condition. Unlike normal worries, anxiety related to GAD is not triggered by a specific event. These worries do not fade or get better with time. GAD interferes with relationships, work, and school. GAD symptoms can vary from mild to severe. People with severe GAD can have intense waves of anxiety with physical symptoms that are similar to panic attacks. What are the causes? The exact cause of GAD is not known, but the following are believed to have an impact:  Differences in natural brain chemicals.  Genes passed down from parents to children.  Differences in the way threats are perceived.  Development during childhood.  Personality. What increases the risk? The following factors may make you more likely to develop this condition:  Being female.  Having a family history of anxiety disorders.  Being very shy.  Experiencing very stressful life events, such as the death of a loved one.  Having a very stressful family environment. What are the signs or symptoms? People with GAD often worry excessively about many things in their lives, such as their health and family. Symptoms may also include:  Mental and emotional symptoms: ? Worrying excessively about natural disasters. ? Fear of being late. ? Difficulty concentrating. ? Fears that others are judging your performance.  Physical symptoms: ? Fatigue. ? Headaches, muscle tension, muscle twitches, trembling, or feeling shaky. ? Feeling like your heart is pounding or beating very fast. ? Feeling out of breath or like you cannot take a deep breath. ? Having trouble falling asleep or staying asleep, or experiencing restlessness. ? Sweating. ? Nausea, diarrhea, or irritable bowel syndrome (IBS).  Behavioral symptoms: ? Experiencing erratic moods or irritability. ? Avoidance of new situations. ? Avoidance of  people. ? Extreme difficulty making decisions. How is this diagnosed? This condition is diagnosed based on your symptoms and medical history. You will also have a physical exam. Your health care provider may perform tests to rule out other possible causes of your symptoms. To be diagnosed with GAD, a person must have anxiety that:  Is out of his or her control.  Affects several different aspects of his or her life, such as work and relationships.  Causes distress that makes him or her unable to take part in normal activities.  Includes at least three symptoms of GAD, such as restlessness, fatigue, trouble concentrating, irritability, muscle tension, or sleep problems. Before your health care provider can confirm a diagnosis of GAD, these symptoms must be present more days than they are not, and they must last for 6 months or longer. How is this treated? This condition may be treated with:  Medicine. Antidepressant medicine is usually prescribed for long-term daily control. Anti-anxiety medicines may be added in severe cases, especially when panic attacks occur.  Talk therapy (psychotherapy). Certain types of talk therapy can be helpful in treating GAD by providing support, education, and guidance. Options include: ? Cognitive behavioral therapy (CBT). People learn coping skills and self-calming techniques to ease their physical symptoms. They learn to identify unrealistic thoughts and behaviors and to replace them with more appropriate thoughts and behaviors. ? Acceptance and commitment therapy (ACT). This treatment teaches people how to be mindful as a way to cope with unwanted thoughts and feelings. ? Biofeedback. This process trains you to manage your body's response (physiological response) through breathing techniques and relaxation methods. You will work with a therapist while machines are used to monitor your physical   symptoms.  Stress management techniques. These include yoga,  meditation, and exercise. A mental health specialist can help determine which treatment is best for you. Some people see improvement with one type of therapy. However, other people require a combination of therapies.   Follow these instructions at home: Lifestyle  Maintain a consistent routine and schedule.  Anticipate stressful situations. Create a plan, and allow extra time to work with your plan.  Practice stress management or self-calming techniques that you have learned from your therapist or your health care provider. General instructions  Take over-the-counter and prescription medicines only as told by your health care provider.  Understand that you are likely to have setbacks. Accept this and be kind to yourself as you persist to take better care of yourself.  Recognize and accept your accomplishments, even if you judge them as small.  Keep all follow-up visits as told by your health care provider. This is important. Contact a health care provider if:  Your symptoms do not get better.  Your symptoms get worse.  You have signs of depression, such as: ? A persistently sad or irritable mood. ? Loss of enjoyment in activities that used to bring you joy. ? Change in weight or eating. ? Changes in sleeping habits. ? Avoiding friends or family members. ? Loss of energy for normal tasks. ? Feelings of guilt or worthlessness. Get help right away if:  You have serious thoughts about hurting yourself or others. If you ever feel like you may hurt yourself or others, or have thoughts about taking your own life, get help right away. Go to your nearest emergency department or:  Call your local emergency services (911 in the [place]  Call a suicide crisis helpline, such as the National Suicide Prevention Lifeline at 1-800-273-8255. This is open 24 hours a day in the [place]  Text the Crisis Text Line at 741741 (in the [place] Summary  Generalized anxiety disorder (GAD) is a mental  health condition that involves worry that is not triggered by a specific event.  People with GAD often worry excessively about many things in their lives, such as their health and family.  GAD may cause symptoms such as restlessness, trouble concentrating, sleep problems, frequent sweating, nausea, diarrhea, headaches, and trembling or muscle twitching.  A mental health specialist can help determine which treatment is best for you. Some people see improvement with one type of therapy. However, other people require a combination of therapies. This information is not intended to replace advice given to you by your health care provider. Make sure you discuss any questions you have with your health care provider. Document Revised: 09/17/2019 Document Reviewed: 09/17/2019 Elsevier Patient Education  2021 Elsevier Inc.  

## 2021-02-26 LAB — URINE CULTURE
MICRO NUMBER:: 11664939
Result:: NO GROWTH
SPECIMEN QUALITY:: ADEQUATE

## 2021-02-28 ENCOUNTER — Encounter: Payer: Self-pay | Admitting: Family Medicine

## 2021-02-28 ENCOUNTER — Telehealth: Payer: Self-pay | Admitting: Family Medicine

## 2021-02-28 LAB — HEPATITIS C ANTIBODY
Hepatitis C Ab: NONREACTIVE
SIGNAL TO CUT-OFF: 0.01 (ref <1.00)

## 2021-02-28 NOTE — Telephone Encounter (Signed)
Pt called. LVM to return call 

## 2021-02-28 NOTE — Telephone Encounter (Signed)
Patient would like lab results.

## 2021-03-02 ENCOUNTER — Encounter: Payer: Self-pay | Admitting: Gastroenterology

## 2021-03-15 ENCOUNTER — Other Ambulatory Visit (HOSPITAL_COMMUNITY): Payer: Self-pay

## 2021-04-01 ENCOUNTER — Other Ambulatory Visit: Payer: Self-pay | Admitting: Cardiovascular Disease

## 2021-04-01 ENCOUNTER — Other Ambulatory Visit (HOSPITAL_COMMUNITY): Payer: Self-pay

## 2021-04-01 MED FILL — Topiramate Tab 200 MG: ORAL | 30 days supply | Qty: 30 | Fill #0 | Status: AC

## 2021-04-01 MED FILL — Galcanezumab-gnlm Subcutaneous Soln Auto-Injector 120 MG/ML: SUBCUTANEOUS | 30 days supply | Qty: 1 | Fill #0 | Status: AC

## 2021-04-08 ENCOUNTER — Encounter: Payer: 59 | Admitting: Physician Assistant

## 2021-04-12 ENCOUNTER — Other Ambulatory Visit (HOSPITAL_COMMUNITY): Payer: Self-pay

## 2021-04-13 ENCOUNTER — Other Ambulatory Visit (HOSPITAL_COMMUNITY): Payer: Self-pay

## 2021-04-18 ENCOUNTER — Other Ambulatory Visit (HOSPITAL_COMMUNITY): Payer: Self-pay

## 2021-04-18 ENCOUNTER — Other Ambulatory Visit: Payer: Self-pay | Admitting: Cardiovascular Disease

## 2021-04-18 ENCOUNTER — Other Ambulatory Visit: Payer: Self-pay | Admitting: Family Medicine

## 2021-04-18 DIAGNOSIS — F419 Anxiety disorder, unspecified: Secondary | ICD-10-CM

## 2021-04-18 MED ORDER — ROSUVASTATIN CALCIUM 40 MG PO TABS
ORAL_TABLET | Freq: Every day | ORAL | 0 refills | Status: DC
  Filled 2021-04-18: qty 30, 30d supply, fill #0

## 2021-04-18 MED ORDER — SERTRALINE HCL 100 MG PO TABS
150.0000 mg | ORAL_TABLET | Freq: Every day | ORAL | 1 refills | Status: DC
  Filled 2021-04-18: qty 45, 30d supply, fill #0
  Filled 2021-05-18: qty 45, 30d supply, fill #1

## 2021-04-18 MED ORDER — AMLODIPINE BESYLATE 5 MG PO TABS
ORAL_TABLET | Freq: Every day | ORAL | 0 refills | Status: DC
  Filled 2021-04-18: qty 30, 30d supply, fill #0

## 2021-04-18 MED FILL — Pantoprazole Sodium EC Tab 40 MG (Base Equiv): ORAL | 90 days supply | Qty: 90 | Fill #0 | Status: AC

## 2021-04-18 MED FILL — Celecoxib Cap 200 MG: ORAL | 30 days supply | Qty: 60 | Fill #0 | Status: AC

## 2021-04-18 MED FILL — Tizanidine HCl Tab 4 MG (Base Equivalent): ORAL | 30 days supply | Qty: 90 | Fill #0 | Status: AC

## 2021-04-18 MED FILL — Ondansetron HCl Tab 8 MG: ORAL | 6 days supply | Qty: 20 | Fill #0 | Status: AC

## 2021-04-18 MED FILL — Baclofen Tab 10 MG: ORAL | 20 days supply | Qty: 60 | Fill #0 | Status: AC

## 2021-04-19 ENCOUNTER — Telehealth: Payer: Self-pay | Admitting: Radiology

## 2021-04-19 NOTE — Telephone Encounter (Signed)
Left message for patient to call [facility] to schedule July headache appointment.

## 2021-04-20 ENCOUNTER — Other Ambulatory Visit: Payer: Self-pay | Admitting: Family Medicine

## 2021-04-20 DIAGNOSIS — Z1231 Encounter for screening mammogram for malignant neoplasm of breast: Secondary | ICD-10-CM

## 2021-04-21 ENCOUNTER — Other Ambulatory Visit (HOSPITAL_COMMUNITY): Payer: Self-pay

## 2021-04-21 MED ORDER — CARESTART COVID-19 HOME TEST VI KIT
PACK | 0 refills | Status: DC
  Filled 2021-04-21: qty 4, 4d supply, fill #0

## 2021-04-26 ENCOUNTER — Ambulatory Visit (HOSPITAL_BASED_OUTPATIENT_CLINIC_OR_DEPARTMENT_OTHER)
Admission: RE | Admit: 2021-04-26 | Discharge: 2021-04-26 | Disposition: A | Discharge status: Home / Self Care | Payer: 59 | Source: Ambulatory Visit | Attending: Family Medicine | Admitting: Family Medicine

## 2021-04-26 ENCOUNTER — Other Ambulatory Visit: Payer: Self-pay

## 2021-04-26 ENCOUNTER — Encounter (HOSPITAL_BASED_OUTPATIENT_CLINIC_OR_DEPARTMENT_OTHER): Payer: Self-pay

## 2021-04-26 DIAGNOSIS — E2839 Other primary ovarian failure: Secondary | ICD-10-CM | POA: Insufficient documentation

## 2021-04-26 DIAGNOSIS — M8589 Other specified disorders of bone density and structure, multiple sites: Secondary | ICD-10-CM | POA: Diagnosis not present

## 2021-04-26 DIAGNOSIS — Z780 Asymptomatic menopausal state: Secondary | ICD-10-CM | POA: Diagnosis not present

## 2021-04-26 DIAGNOSIS — Z1231 Encounter for screening mammogram for malignant neoplasm of breast: Secondary | ICD-10-CM | POA: Diagnosis not present

## 2021-04-27 ENCOUNTER — Other Ambulatory Visit: Payer: Self-pay | Admitting: Family Medicine

## 2021-04-27 ENCOUNTER — Encounter: Payer: Self-pay | Admitting: Family Medicine

## 2021-04-27 DIAGNOSIS — R928 Other abnormal and inconclusive findings on diagnostic imaging of breast: Secondary | ICD-10-CM

## 2021-04-28 NOTE — Telephone Encounter (Signed)
Not if order is in

## 2021-04-28 NOTE — Telephone Encounter (Signed)
For diagnostic she would need ov -- we have to put in order exactly where problem is

## 2021-04-30 ENCOUNTER — Other Ambulatory Visit (HOSPITAL_COMMUNITY): Payer: Self-pay

## 2021-04-30 ENCOUNTER — Telehealth: Payer: 59 | Admitting: Physician Assistant

## 2021-04-30 DIAGNOSIS — J019 Acute sinusitis, unspecified: Secondary | ICD-10-CM | POA: Diagnosis not present

## 2021-04-30 DIAGNOSIS — B9689 Other specified bacterial agents as the cause of diseases classified elsewhere: Secondary | ICD-10-CM

## 2021-04-30 MED ORDER — DOXYCYCLINE HYCLATE 100 MG PO TABS
100.0000 mg | ORAL_TABLET | Freq: Two times a day (BID) | ORAL | 0 refills | Status: DC
Start: 2021-04-30 — End: 2021-05-11
  Filled 2021-04-30: qty 20, 10d supply, fill #0

## 2021-04-30 NOTE — Progress Notes (Signed)
We are sorry that you are not feeling well.  Here is how we plan to help!  Based on what you have shared with me it looks like you have sinusitis.  Sinusitis is inflammation and infection in the sinus cavities of the head.  Based on your presentation I believe you most likely have Acute Bacterial Sinusitis.  This is an infection caused by bacteria and is treated with antibiotics. I have prescribed Doxycycline 100mg by mouth twice a day for 10 days. You may use an oral decongestant such as Mucinex D or if you have glaucoma or high blood pressure use plain Mucinex. Saline nasal spray help and can safely be used as often as needed for congestion.  If you develop worsening sinus pain, fever or notice severe headache and vision changes, or if symptoms are not better after completion of antibiotic, please schedule an appointment with a health care provider.    Sinus infections are not as easily transmitted as other respiratory infection, however we still recommend that you avoid close contact with loved ones, especially the very young and elderly.  Remember to wash your hands thoroughly throughout the day as this is the number one way to prevent the spread of infection!  Home Care: Only take medications as instructed by your medical team. Complete the entire course of an antibiotic. Do not take these medications with alcohol. A steam or ultrasonic humidifier can help congestion.  You can place a towel over your head and breathe in the steam from hot water coming from a faucet. Avoid close contacts especially the very young and the elderly. Cover your mouth when you cough or sneeze. Always remember to wash your hands.  Get Help Right Away If: You develop worsening fever or sinus pain. You develop a severe head ache or visual changes. Your symptoms persist after you have completed your treatment plan.  Make sure you Understand these instructions. Will watch your condition. Will get help right away if  you are not doing well or get worse.  Your e-visit answers were reviewed by a board certified advanced clinical practitioner to complete your personal care plan.  Depending on the condition, your plan could have included both over the counter or prescription medications.  If there is a problem please reply  once you have received a response from your provider.  Your safety is important to us.  If you have drug allergies check your prescription carefully.    You can use MyChart to ask questions about today's visit, request a non-urgent call back, or ask for a work or school excuse for 24 hours related to this e-Visit. If it has been greater than 24 hours you will need to follow up with your provider, or enter a new e-Visit to address those concerns.  You will get an e-mail in the next two days asking about your experience.  I hope that your e-visit has been valuable and will speed your recovery. Thank you for using e-visits.  I provided 5 minutes of non face-to-face time during this encounter for chart review and documentation.   

## 2021-05-11 ENCOUNTER — Ambulatory Visit (AMBULATORY_SURGERY_CENTER): Payer: 59

## 2021-05-11 ENCOUNTER — Other Ambulatory Visit: Payer: Self-pay

## 2021-05-11 ENCOUNTER — Other Ambulatory Visit (HOSPITAL_COMMUNITY): Payer: Self-pay

## 2021-05-11 ENCOUNTER — Telehealth: Payer: 59 | Admitting: Physician Assistant

## 2021-05-11 VITALS — Ht 64.0 in | Wt 170.0 lb

## 2021-05-11 DIAGNOSIS — Z1211 Encounter for screening for malignant neoplasm of colon: Secondary | ICD-10-CM

## 2021-05-11 DIAGNOSIS — B379 Candidiasis, unspecified: Secondary | ICD-10-CM

## 2021-05-11 DIAGNOSIS — T3695XA Adverse effect of unspecified systemic antibiotic, initial encounter: Secondary | ICD-10-CM

## 2021-05-11 MED ORDER — NA SULFATE-K SULFATE-MG SULF 17.5-3.13-1.6 GM/177ML PO SOLN
1.0000 | Freq: Once | ORAL | 0 refills | Status: AC
  Filled 2021-05-11: qty 354, 2d supply, fill #0

## 2021-05-11 MED ORDER — FLUCONAZOLE 150 MG PO TABS
150.0000 mg | ORAL_TABLET | Freq: Once | ORAL | 0 refills | Status: AC
  Filled 2021-05-11: qty 1, 1d supply, fill #0

## 2021-05-11 NOTE — Progress Notes (Signed)
I have spent 5 minutes in review of e-visit questionnaire, review and updating patient chart, medical decision making and response to patient.   Finas Delone Cody Shavonta Gossen, PA-C    

## 2021-05-11 NOTE — Progress Notes (Signed)

## 2021-05-11 NOTE — Progress Notes (Signed)
Pre visit completed via phone call;  Patient verified name, DOB, and address; No egg or soy allergy known to patient  No issues with past sedation with any surgeries or procedures Patient denies ever being told they had issues or difficulty with intubation  No FH of Malignant Hyperthermia No diet pills per patient No home 02 use per patient  No blood thinners per patient  Pt denies issues with constipation  No A fib or A flutter  EMMI video via MyChart  COVID 19 guidelines implemented in PV today with Pt and RN  Pt is fully vaccinated for Covid x 2 + booster;  NO PA's for preps discussed with pt in PV today  Discussed with pt there will be an out-of-pocket cost for prep and that varies from $0 to 70 dollars   Due to the COVID-19 pandemic we are asking patients to follow certain guidelines.  Pt aware of COVID protocols and [facility] guidelines   

## 2021-05-17 NOTE — Progress Notes (Deleted)
   I, Philbert Riser, LAT, ATC acting as a scribe for [name], MD.  [name] is a 60 y.o. female who presents to [facility] today for R-sided leg pain x /. Pt was last seen by Dr. Denyse Amass on 02/24/20 for R knee and R lateral hip pain [place] Today, pt locates pain to   Radiating pain: LE numbness/tingling: LE weakness: Aggravates: Treatments tried:  Dx imaging: 02/24/20 R knee XR  07/01/19 R femur MRI  Pertinent review of systems: ***  Relevant historical information: ***   Exam:  There were no vitals taken for this visit. General: Well Developed, well nourished, and in no acute distress.   MSK: ***    Lab and Radiology Results No results found for this or any previous visit (from the past 72 hour(s)). No results found.     Assessment and Plan: 60 y.o. female with ***   PDMP not reviewed this encounter. No orders of the defined types were placed in this encounter.  No orders of the defined types were placed in this encounter.    Discussed warning signs or symptoms. Please see discharge instructions. Patient expresses understanding.   ***

## 2021-05-18 ENCOUNTER — Other Ambulatory Visit: Payer: Self-pay | Admitting: Cardiovascular Disease

## 2021-05-18 ENCOUNTER — Other Ambulatory Visit (HOSPITAL_COMMUNITY): Payer: Self-pay

## 2021-05-18 ENCOUNTER — Ambulatory Visit: Payer: 59 | Admitting: Family Medicine

## 2021-05-18 ENCOUNTER — Other Ambulatory Visit: Payer: Self-pay

## 2021-05-18 ENCOUNTER — Other Ambulatory Visit: Payer: Self-pay | Admitting: Physician Assistant

## 2021-05-18 ENCOUNTER — Telehealth: Payer: 59 | Admitting: Nurse Practitioner

## 2021-05-18 DIAGNOSIS — J40 Bronchitis, not specified as acute or chronic: Secondary | ICD-10-CM | POA: Diagnosis not present

## 2021-05-18 DIAGNOSIS — H5789 Other specified disorders of eye and adnexa: Secondary | ICD-10-CM | POA: Diagnosis not present

## 2021-05-18 DIAGNOSIS — G43809 Other migraine, not intractable, without status migrainosus: Secondary | ICD-10-CM

## 2021-05-18 MED FILL — Celecoxib Cap 200 MG: ORAL | 30 days supply | Qty: 60 | Fill #1 | Status: AC

## 2021-05-18 NOTE — Progress Notes (Signed)
E-Visit for [employer]   We are sorry that you are not feeling well.  Here is how we plan to help!  Based on what you have shared with me it looks like you have conjunctivitis.  Conjunctivitis is a common inflammatory or infectious condition of the eye that is often referred to as "pink eye".  In most cases it is contagious (viral or bacterial). However, not all conjunctivitis requires antibiotics (ex. Allergic).  We have made appropriate suggestions for you based upon your presentation.  I recommend that you use OpconA, 1-2 drops every 4-6 hours (an over the counter allergy drop available at your local pharmacy).  Your pharmacist may have an alternative suggestion.  Pink eye can be highly contagious.  It is typically spread through direct contact with secretions, or contaminated objects or surfaces that one may have touched.  Strict handwashing is suggested with soap and water is urged.  If not available, use alcohol based had sanitizer.  Avoid unnecessary touching of the eye.  If you wear contact lenses, you will need to refrain from wearing them until you see no white discharge from the eye for at least 24 hours after being on medication.  You should see symptom improvement in 1-2 days after starting the medication regimen.  Call us if symptoms are not improved in 1-2 days.  Home Care:  Wash your hands often!  Do not wear your contacts until you complete your treatment plan.  Avoid sharing towels, bed linen, personal items with a person who has pink eye.  See attention for anyone in your home with similar symptoms.  Get Help Right Away If:  Your symptoms do not improve.  You develop blurred or loss of vision.  Your symptoms worsen (increased discharge, pain or redness)  Your e-visit answers were reviewed by a board certified advanced clinical practitioner to complete your personal care plan.  Depending on the condition, your plan could have included both over the counter or prescription  medications.  If there is a problem please reply  once you have received a response from your provider.  Your safety is important to [place].  If you have drug allergies check your prescription carefully.    You can use MyChart to ask questions about today's visit, request a non-urgent call back, or ask for a work or school excuse for 24 hours related to this e-Visit. If it has been greater than 24 hours you will need to follow up with your provider, or enter a new e-Visit to address those concerns.   You will get an e-mail in the next two days asking about your experience.  I hope that your e-visit has been valuable and will speed your recovery. Thank you for using e-visits.   5-10 minutes spent reviewing and documenting in chart.

## 2021-05-19 ENCOUNTER — Other Ambulatory Visit (HOSPITAL_COMMUNITY): Payer: Self-pay

## 2021-05-19 MED ORDER — BENZONATATE 100 MG PO CAPS
100.0000 mg | ORAL_CAPSULE | Freq: Three times a day (TID) | ORAL | 0 refills | Status: DC | PRN
  Filled 2021-05-19: qty 30, 10d supply, fill #0

## 2021-05-19 MED ORDER — PREDNISONE 10 MG (21) PO TBPK
ORAL_TABLET | ORAL | 0 refills | Status: DC
  Filled 2021-05-19: qty 21, 6d supply, fill #0

## 2021-05-19 NOTE — Progress Notes (Signed)
We are sorry that you are not feeling well.  Here is how we plan to help!  Based on your presentation I believe you most likely have A cough due to a virus.  This is called viral bronchitis and is best treated by rest, plenty of fluids and control of the cough.  You may use Ibuprofen or Tylenol as directed to help your symptoms.     In addition you may use A prescription cough medication called Tessalon Perles 100mg. You may take 1-2 capsules every 8 hours as needed for your cough.  Prednisone 10 mg daily for 6 days (see taper instructions below)  Directions for 6 day taper: Day 1: 2 tablets before breakfast, 1 after both lunch & dinner and 2 at bedtime Day 2: 1 tab before breakfast, 1 after both lunch & dinner and 2 at bedtime Day 3: 1 tab at each meal & 1 at bedtime Day 4: 1 tab at breakfast, 1 at lunch, 1 at bedtime Day 5: 1 tab at breakfast & 1 tab at bedtime Day 6: 1 tab at breakfast   From your responses in the eVisit questionnaire you describe inflammation in the upper respiratory tract which is causing a significant cough.  This is commonly called Bronchitis and has four common causes:    Allergies  Viral Infections  Acid Reflux  Bacterial Infection Allergies, viruses and acid reflux are treated by controlling symptoms or eliminating the cause. An example might be a cough caused by taking certain blood pressure medications. You stop the cough by changing the medication. Another example might be a cough caused by acid reflux. Controlling the reflux helps control the cough.  USE OF BRONCHODILATOR ("RESCUE") INHALERS: There is a risk from using your bronchodilator too frequently.  The risk is that over-reliance on a medication which only relaxes the muscles surrounding the breathing tubes can reduce the effectiveness of medications prescribed to reduce swelling and congestion of the tubes themselves.  Although you feel brief relief from the bronchodilator inhaler, your asthma may  actually be worsening with the tubes becoming more swollen and filled with mucus.  This can delay other crucial treatments, such as oral steroid medications. If you need to use a bronchodilator inhaler daily, several times per day, you should discuss this with your provider.  There are probably better treatments that could be used to keep your asthma under control.     HOME CARE . Only take medications as instructed by your medical team. . Complete the entire course of an antibiotic. . Drink plenty of fluids and get plenty of rest. . Avoid close contacts especially the very young and the elderly . Cover your mouth if you cough or cough into your sleeve. . Always remember to wash your hands . A steam or ultrasonic humidifier can help congestion.   GET HELP RIGHT AWAY IF: . You develop worsening fever. . You become short of breath . You cough up blood. . Your symptoms persist after you have completed your treatment plan MAKE SURE YOU   Understand these instructions.  Will watch your condition.  Will get help right away if you are not doing well or get worse.  Your e-visit answers were reviewed by a board certified advanced clinical practitioner to complete your personal care plan.  Depending on the condition, your plan could have included both over the counter or prescription medications. If there is a problem please reply  once you have received a response from your provider. Your   safety is important to us.  If you have drug allergies check your prescription carefully.    You can use MyChart to ask questions about today's visit, request a non-urgent call back, or ask for a work or school excuse for 24 hours related to this e-Visit. If it has been greater than 24 hours you will need to follow up with your provider, or enter a new e-Visit to address those concerns. You will get an e-mail in the next two days asking about your experience.  I hope that your e-visit has been valuable and will  speed your recovery. Thank you for using e-visits.   

## 2021-05-19 NOTE — Addendum Note (Signed)
Addended by: Margaretann Loveless on: 05/19/2021 11:20 AM   Modules accepted: Orders

## 2021-05-20 ENCOUNTER — Other Ambulatory Visit (HOSPITAL_COMMUNITY): Payer: Self-pay

## 2021-05-20 MED ORDER — TOPIRAMATE 200 MG PO TABS
ORAL_TABLET | Freq: Every day | ORAL | 5 refills | Status: DC
Start: 2021-05-20 — End: 2021-09-30
  Filled 2021-05-20: qty 30, 30d supply, fill #0
  Filled 2021-07-11: qty 30, 30d supply, fill #1
  Filled 2021-08-16 – 2021-08-29 (×2): qty 30, 30d supply, fill #2

## 2021-05-20 MED ORDER — EMGALITY 120 MG/ML ~~LOC~~ SOAJ
SUBCUTANEOUS | 11 refills | Status: DC
  Filled 2021-05-20: qty 1, 30d supply, fill #0
  Filled 2021-08-16 – 2021-08-29 (×2): qty 1, 30d supply, fill #1
  Filled 2021-10-07: qty 1, 30d supply, fill #2
  Filled 2021-12-08: qty 1, 30d supply, fill #3
  Filled 2022-02-24: qty 1, 30d supply, fill #4

## 2021-05-23 ENCOUNTER — Other Ambulatory Visit (HOSPITAL_COMMUNITY): Payer: Self-pay

## 2021-05-23 ENCOUNTER — Encounter: Payer: Self-pay | Admitting: Family Medicine

## 2021-05-24 ENCOUNTER — Other Ambulatory Visit: Payer: 59

## 2021-05-25 ENCOUNTER — Encounter: Payer: 59 | Admitting: Gastroenterology

## 2021-05-25 ENCOUNTER — Telehealth: Payer: 59 | Admitting: Family

## 2021-05-25 DIAGNOSIS — R059 Cough, unspecified: Secondary | ICD-10-CM

## 2021-05-25 NOTE — Progress Notes (Signed)
Based on what you shared with me, I feel your condition warrants further evaluation and I recommend that you be seen in a face to face office visit.  After reviewing your chart, looks like you were treated with antibiotics on 04/30/21 and prednisone on 05/18/21. Given your symptoms have worsen , you need to be seen in person for evaluation.   NOTE: If you entered your credit card information for this eVisit, you will not be charged. You may see a "hold" on your card for the $35 but that hold will drop off and you will not have a charge processed.   If you are having a true medical emergency please call 911.      For an urgent face to face visit, [place] has six urgent care centers for your convenience:     [facility] at [facility] Directions [phone] [address] [place], [place] [address] 8 am - 4 pm Monday - Friday    [facility]) Get Driving Directions [phone] [address] [place], [place] [address] 8 am to 8 pm Monday-Friday 10 am to 6 pm [facility]) Get Driving Directions [phone]  [address] [place],  [place]  [address] 8 am to 8 pm Monday-Friday 8 am to 4 pm [facility] at [facility] Get Driving Directions [phone] [address] [place], [place] [address] 8 am to 8 pm Monday-Friday 8 am to 4 pm [facility] at [facility] Get Driving Directions  [phone] [address] [place], [place] [address] 8 am to 8 pm Monday-Friday 8 am to 4 pm [facility] at [facility] Directions [phone] [address] [place], [place] [address] 8 am to 8 pm Monday-Friday 8 am to 4 pm Saturday-Sunday     Your MyChart E-visit questionnaire answers  were reviewed by a board certified advanced clinical practitioner to complete your personal care plan based on your specific symptoms.  Thank you for using e-Visits.

## 2021-05-27 ENCOUNTER — Ambulatory Visit (INDEPENDENT_AMBULATORY_CARE_PROVIDER_SITE_OTHER): Payer: 59

## 2021-05-27 ENCOUNTER — Other Ambulatory Visit: Payer: Self-pay

## 2021-05-27 ENCOUNTER — Encounter: Payer: Self-pay | Admitting: Family Medicine

## 2021-05-27 ENCOUNTER — Ambulatory Visit (INDEPENDENT_AMBULATORY_CARE_PROVIDER_SITE_OTHER): Payer: 59 | Admitting: Family Medicine

## 2021-05-27 VITALS — BP 128/80 | HR 58 | Ht 64.0 in | Wt 174.2 lb

## 2021-05-27 DIAGNOSIS — G8929 Other chronic pain: Secondary | ICD-10-CM | POA: Diagnosis not present

## 2021-05-27 DIAGNOSIS — M545 Low back pain, unspecified: Secondary | ICD-10-CM

## 2021-05-27 DIAGNOSIS — M7061 Trochanteric bursitis, right hip: Secondary | ICD-10-CM | POA: Diagnosis not present

## 2021-05-27 DIAGNOSIS — M160 Bilateral primary osteoarthritis of hip: Secondary | ICD-10-CM | POA: Diagnosis not present

## 2021-05-27 NOTE — Progress Notes (Signed)
   I, Philbert Riser, LAT, ATC acting as a scribe for [name], MD.  [name] is a 60 y.o. female who presents to [facility] today for R hip/buttock pain x 6 months. Pt was previously seen by Dr.Daja Shuping on 02/24/20 for R knee pain. Today, pt locates pain to R lower back, buttock and hip.  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  Low back pain: yes, R-side Radiating pain: into her R buttock and hip LE numbness/tingling: No Aggravates: prolonged standing; laying on her R side Treatments tried: prednisone taper for another issue; ice; heat; topical salves    Pertinent review of systems: no fever or chills  Relevant historical information: HTN   Exam:  BP 128/80 (BP Location: Right Arm, Patient Position: Sitting, Cuff Size: Normal)   Pulse (!) 58   Ht 5\' 4"  (1.626 m)   Wt 174 lb 3.2 oz (79 kg)   SpO2 97%   BMI 29.90 kg/m  General: Well Developed, well nourished, and in no acute distress.   MSK: L-spine normal. Nontender midline. Normal lumbar motion. Right hip normal. Tender palpation greater trochanter.  Excellent hip abduction strength diminished 4/5 with pain.  External rotation strength intact. Left hip normal-appearing nontender normal motion normal strength.    Lab and Radiology Results  X-ray images right hip obtained today personally and independently interpreted No fractures. No significant DJD.  Await formal radiology review    Assessment and Plan: 60 y.o. female with right buttocks and lateral hip pain ongoing for about 6 months.  Pain thought to be due to hip abductor tendinopathy weakness and greater trochanteric bursitis.  Plan to treat with physical therapy.  Recheck in about 6 weeks.  If not better would consider MRI or injection.  Doubtful for lumbar radiculopathy or sciatica.  Recheck back or contact me sooner if needed.   PDMP not reviewed this encounter. Orders Placed This Encounter  Procedures   DG HIP  UNILAT WITH PELVIS 2-3 VIEWS RIGHT    Standing Status:   Future    Number of Occurrences:   1    Standing Expiration Date:   05/27/2022    Order Specific Question:   Reason for Exam (SYMPTOM  OR DIAGNOSIS REQUIRED)    Answer:   eval hip pain    Order Specific Question:   Preferred imaging location?    Answer:   05/29/2022   Ambulatory referral to Physical Therapy    Referral Priority:   Routine    Referral Type:   Physical Medicine    Referral Reason:   Specialty Services Required    Requested Specialty:   Physical Therapy    Number of Visits Requested:   1   No orders of the defined types were placed in this encounter.    Discussed warning signs or symptoms. Please see discharge instructions. Patient expresses understanding.   The above documentation has been reviewed and is accurate and complete Kyra Searles, M.D.

## 2021-05-27 NOTE — Patient Instructions (Signed)
Thank you for coming in today.    

## 2021-05-30 ENCOUNTER — Ambulatory Visit: Payer: 59 | Admitting: Medical

## 2021-05-30 ENCOUNTER — Ambulatory Visit (HOSPITAL_BASED_OUTPATIENT_CLINIC_OR_DEPARTMENT_OTHER)
Admission: RE | Admit: 2021-05-30 | Discharge: 2021-05-30 | Disposition: A | Discharge status: Home / Self Care | Payer: 59 | Source: Ambulatory Visit | Attending: Medical | Admitting: Medical

## 2021-05-30 ENCOUNTER — Other Ambulatory Visit: Payer: Self-pay

## 2021-05-30 ENCOUNTER — Other Ambulatory Visit (HOSPITAL_COMMUNITY): Payer: Self-pay

## 2021-05-30 VITALS — BP 118/86 | HR 93 | Temp 98.3°F | Resp 18 | Ht 64.0 in | Wt 173.0 lb

## 2021-05-30 DIAGNOSIS — H6690 Otitis media, unspecified, unspecified ear: Secondary | ICD-10-CM

## 2021-05-30 DIAGNOSIS — S2231XA Fracture of one rib, right side, initial encounter for closed fracture: Secondary | ICD-10-CM | POA: Diagnosis not present

## 2021-05-30 DIAGNOSIS — R059 Cough, unspecified: Secondary | ICD-10-CM

## 2021-05-30 DIAGNOSIS — J0100 Acute maxillary sinusitis, unspecified: Secondary | ICD-10-CM

## 2021-05-30 MED ORDER — CEFDINIR 300 MG PO CAPS
300.0000 mg | ORAL_CAPSULE | Freq: Two times a day (BID) | ORAL | 0 refills | Status: DC
  Filled 2021-05-30: qty 20, 10d supply, fill #0

## 2021-05-30 MED ORDER — FLUTICASONE PROPIONATE 50 MCG/ACT NA SUSP
2.0000 | Freq: Every day | NASAL | 1 refills | Status: DC
  Filled 2021-05-30: qty 16, 30d supply, fill #0

## 2021-05-30 MED ORDER — FLUCONAZOLE 150 MG PO TABS
150.0000 mg | ORAL_TABLET | Freq: Once | ORAL | 1 refills | Status: AC
  Filled 2021-05-30: qty 1, 1d supply, fill #0

## 2021-05-30 NOTE — Progress Notes (Signed)
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  No fracture seen.

## 2021-05-30 NOTE — Progress Notes (Signed)
Subjective:    Patient ID: [name], female    DOB: 09/30/1961, 60 y.o.   MRN: [id number]  HPI  Pt in with both side ear pain. Left is worse than the rt side.  Pt had some bronchitis signs/symptoms about 2 weeks ago. She states negative covid test. She has residual cough.  As her bronchitis got better she got laryngitis type signs/symptoms.  Over past week ear pain developed. Also mild st.  Pt states 2 weeks ago she gto benzonatate and prednisone taper.   No recent antibiotic but doxycycline one month ago.       Review of Systems  Constitutional:  Negative for chills, fatigue and fever.  HENT:  Positive for congestion, sinus pressure and sinus pain. Negative for postnasal drip.   Respiratory:  Negative for cough, chest tightness, shortness of breath and wheezing.   Cardiovascular:  Negative for chest pain and palpitations.  Gastrointestinal:  Negative for abdominal pain and blood in stool.  Genitourinary:  Negative for dysuria and frequency.  Musculoskeletal:  Negative for back pain and gait problem.    Past Medical History:  Diagnosis Date   Allergy    seasonal allergies   Anxiety    on meds   Arthritis    on meds   Depression    on meds   Dizziness 03/05/2017   Essential hypertension 03/05/2017   on meds   GERD (gastroesophageal reflux disease)    on meds   Hyperlipidemia 03/05/2017   on meds   Infertility, female    Migraines    Osteopenia      Social History   Socioeconomic History   Marital status: Legally Separated    Spouse name: Not on file   Number of children: Not on file   Years of education: Not on file   Highest education level: Not on file  Occupational History    Employer: [employer]    Comment: [employer]  Tobacco Use   Smoking status: Former    Pack years: 0.00    Types: Cigarettes    Quit date: 02/09/1992    Years since quitting: 29.3   Smokeless tobacco: Never  Vaping Use   Vaping Use: Never used   Substance and Sexual Activity   Alcohol use: Yes    Alcohol/week: 12.0 standard drinks    Types: 12 Standard drinks or equivalent per week   Drug use: No   Sexual activity: Yes    Partners: Male  Other Topics Concern   Not on file  Social History Narrative   Not on file   Social Determinants of Health   Financial Resource Strain: Not on file  Food Insecurity: Not on file  Transportation Needs: Not on file  Physical Activity: Not on file  Stress: Not on file  Social Connections: Not on file  Intimate Partner Violence: Not on file    Past Surgical History:  Procedure Laterality Date   ANKLE SURGERY Left 1979   COLONOSCOPY  2011   MS-F/V-moviprep(exc)-normal -10 yr recall   SHOULDER SURGERY Left 1990    Family History  Problem Relation Age of Onset   Hypertension Mother    Allergic rhinitis Mother    Sleep apnea Mother    Hyperlipidemia Mother    Cancer Maternal Grandmother    Stroke Other    Eczema Daughter    Alcohol abuse Father    Stroke Father    Heart disease Maternal Grandfather    Alcohol abuse Paternal  Grandfather    Colon polyps Neg Hx    Colon cancer Neg Hx    Esophageal cancer Neg Hx    Rectal cancer Neg Hx    Stomach cancer Neg Hx     Allergies  Allergen Reactions   Augmentin [Amoxicillin-Pot Clavulanate]     diarrhea    Current Outpatient Medications on File Prior to Visit  Medication Sig Dispense Refill   amLODipine (NORVASC) 5 MG tablet TAKE 1 TABLET BY MOUTH ONCE DAILY 30 tablet 0   Ascorbic Acid (VITAMIN C PO) Take by mouth.     aspirin EC 81 MG tablet Take 81 mg by mouth daily.     baclofen (LIORESAL) 10 MG tablet TAKE 1 TABLET BY MOUTH EVERY 8 HOURS AS NEEDED 60 tablet 3   BOTOX 100 units SOLR injection INJECT 100 UNITS INTO THE MUSCLE EVERY 3 MONTHS. 1 each 3   Calcium Carbonate (CALCIUM 600 PO) Take by mouth.     celecoxib (CELEBREX) 200 MG capsule TAKE 1 CAPSULE BY MOUTH TWICE DAILY 60 capsule 11   Fluticasone Propionate (FLONASE  NA) Place into the nose.     Galcanezumab-gnlm (EMGALITY) 120 MG/ML SOAJ INJECT 1 PEN EVERY 30 DAYS 1 mL 11   hydrOXYzine (VISTARIL) 50 MG capsule TAKE 2 CAPSULES BY MOUTH 3 TIMES DAILY AS NEEDED FOR ITCHING 60 capsule 0   ibuprofen (ADVIL) 800 MG tablet Take 1 tablet (800 mg total) by mouth every 8 (eight) hours as needed. 60 tablet 3   Loteprednol Etabonate 1 % SUSP INSTILL 1 DROP INTO BOTH EYES DAILY 14 mL 0   magnesium oxide (MAG-OX) 400 MG tablet Take 400 mg by mouth daily.     Multiple Vitamins-Minerals (ZINC PO) Take by mouth.     naratriptan (AMERGE) 2.5 MG tablet TAKE 1 TABLET BY MOUTH AT ONSET OF HEADACHE. IF RETURNS OR DOES NOT RESOLVE MAY REPEAT AFTER 4 HOURS. DO NOT EXCEED 2 TABLETS IN 24 HOURS 10 tablet 11   NONFORMULARY OR COMPOUNDED ITEM Gaba Calm as needed for anxiety     ondansetron (ZOFRAN) 8 MG tablet TAKE 1 TABLET BY MOUTH EVERY 8 HOURS AS NEEDED 20 tablet 3   pantoprazole (PROTONIX) 40 MG tablet TAKE 1 TABLET BY MOUTH ONCE DAILY 90 tablet 3   Probiotic Product (PROBIOTIC DAILY PO) Take by mouth.     Pseudoephedrine HCl (SUDAFED PO) Take 1 tablet by mouth daily as needed.     RESTASIS 0.05 % ophthalmic emulsion Place 1 drop into both eyes 2 (two) times daily.     Rimegepant Sulfate (NURTEC) 75 MG TBDP Take 75 mg by mouth every other day. 16 tablet 11   rosuvastatin (CRESTOR) 40 MG tablet TAKE 1 TABLET BY MOUTH ONCE DAILY 30 tablet 0   sertraline (ZOLOFT) 100 MG tablet Take 1 and 1/2 tablets (150 mg total) by mouth daily. 45 tablet 1   tiZANidine (ZANAFLEX) 4 MG tablet TAKE 1 TABLET BY MOUTH 3 TIMES DAILY 90 tablet 2   topiramate (TOPAMAX) 200 MG tablet TAKE 1 TABLET BY MOUTH ONCE DAILY 30 tablet 5   traMADol (ULTRAM) 50 MG tablet TAKE 1 TABLET BY MOUTH EVERY 6 HOURS AS NEEDED FOR SEVERE PAIN 10 tablet 0   traZODone (DESYREL) 50 MG tablet TAKE 1/2 TO 1 TABLET BY MOUTH AT BEDTIME AS NEEDED FOR SLEEP 30 tablet 0   Ubrogepant (UBRELVY) 100 MG TABS Take 100 mg by mouth as needed.  8 tablet 11   UNABLE TO FIND CBD 50  mg     vitamin B-12 (CYANOCOBALAMIN) 500 MCG tablet Take 500 mcg by mouth daily.     VITAMIN D PO Take by mouth.     No current facility-administered medications on file prior to visit.    BP 118/86   Pulse 93   Temp 98.3 F (36.8 C)   Resp 18   Ht 5\' 4"  (1.626 m)   Wt 173 lb (78.5 kg)   SpO2 98%   BMI 29.70 kg/m        Objective:   Physical Exam   General Mental Status- Alert. General Appearance- Not in acute distress.   Skin General: Color- Normal Color. Moisture- Normal Moisture.  Neck Carotid Arteries- Normal color. Moisture- Normal Moisture. No carotid bruits. No JVD.  Chest and Lung Exam Auscultation: Breath Sounds:-Normal.  Cardiovascular Auscultation:Rythm- Regular. Murmurs & Other Heart Sounds:Auscultation of the heart reveals- No Murmurs.  Abdomen Inspection:-Inspeection Normal. Palpation/Percussion:Note:No mass. Palpation and Percussion of the abdomen reveal- Non Tender, Non Distended + BS, no rebound or guarding.    Neurologic Cranial Nerve exam:- CN III-XII intact(No nystagmus), symmetric smile. Strength:- 5/5 equal and symmetric strength both upper and lower extremities.   Heent- maxillary sinus pressure.     Assessment & Plan:   You have some sinus infection and bilateral ear pain. Left side does look worse.  Rx flonase for nasal congestion.  Rx cefdnir antibiotic for 10 days.(If ear pain persists despite this might add on azithromycin)  Continue benzonatate for cough.  Get chest xray today.  Difulcan rx send to pt pharmacy.  Follow up 1-2 weeks with pcp or as needed.   , PA-C

## 2021-05-30 NOTE — Patient Instructions (Signed)
You have some sinus infection and bilateral ear pain. Left side does look worse.  Rx flonase for nasal congestion.  Rx cefdnir antibiotic for 10 days.(If ear pain persists despite this might add on azithromycin)  Continue benzonatate for cough.  Get chest xray today.  Difulcan rx send to pt pharmacy.  Follow up 7 days or as needed

## 2021-06-01 ENCOUNTER — Ambulatory Visit: Payer: 59

## 2021-06-01 ENCOUNTER — Ambulatory Visit
Admission: RE | Admit: 2021-06-01 | Discharge: 2021-06-01 | Disposition: A | Discharge status: Home / Self Care | Payer: 59 | Source: Ambulatory Visit | Attending: Family Medicine | Admitting: Family Medicine

## 2021-06-01 ENCOUNTER — Other Ambulatory Visit: Payer: Self-pay

## 2021-06-01 ENCOUNTER — Other Ambulatory Visit: Payer: Self-pay | Admitting: Family Medicine

## 2021-06-01 DIAGNOSIS — N6489 Other specified disorders of breast: Secondary | ICD-10-CM

## 2021-06-01 DIAGNOSIS — R928 Other abnormal and inconclusive findings on diagnostic imaging of breast: Secondary | ICD-10-CM | POA: Diagnosis not present

## 2021-06-07 ENCOUNTER — Telehealth: Payer: Self-pay | Admitting: Family Medicine

## 2021-06-07 ENCOUNTER — Other Ambulatory Visit (HOSPITAL_COMMUNITY): Payer: Self-pay

## 2021-06-07 MED ORDER — NYSTATIN 100000 UNIT/ML MT SUSP
500000.0000 [IU] | Freq: Four times a day (QID) | OROMUCOSAL | 0 refills | Status: DC
  Filled 2021-06-07: qty 60, 3d supply, fill #0

## 2021-06-07 NOTE — Telephone Encounter (Signed)
Patient has been on multiple antibiotics and now has developed oral thrush.  She has a painful mouth.  She would like treatment.  Nystatin sent to pharmacy of choice

## 2021-06-08 ENCOUNTER — Other Ambulatory Visit: Payer: Self-pay

## 2021-06-08 ENCOUNTER — Ambulatory Visit
Admission: RE | Admit: 2021-06-08 | Discharge: 2021-06-08 | Disposition: A | Discharge status: Home / Self Care | Payer: 59 | Source: Ambulatory Visit | Attending: Family Medicine | Admitting: Family Medicine

## 2021-06-08 DIAGNOSIS — N6489 Other specified disorders of breast: Secondary | ICD-10-CM

## 2021-06-08 DIAGNOSIS — N6011 Diffuse cystic mastopathy of right breast: Secondary | ICD-10-CM | POA: Diagnosis not present

## 2021-06-08 DIAGNOSIS — R921 Mammographic calcification found on diagnostic imaging of breast: Secondary | ICD-10-CM | POA: Diagnosis not present

## 2021-06-10 ENCOUNTER — Encounter: Payer: Self-pay | Admitting: Medical

## 2021-06-11 ENCOUNTER — Other Ambulatory Visit (HOSPITAL_COMMUNITY): Payer: Self-pay

## 2021-06-11 MED ORDER — FLUCONAZOLE 150 MG PO TABS
ORAL_TABLET | ORAL | 0 refills | Status: DC
  Filled 2021-06-11: qty 2, 6d supply, fill #0

## 2021-06-11 NOTE — Addendum Note (Signed)
Addended by: Gwenevere Abbot on: 06/11/2021 10:29 AM   Modules accepted: Orders

## 2021-06-14 ENCOUNTER — Ambulatory Visit: Payer: 59 | Admitting: Physical Therapy

## 2021-06-16 ENCOUNTER — Other Ambulatory Visit (HOSPITAL_COMMUNITY): Payer: Self-pay

## 2021-06-16 MED FILL — Baclofen Tab 10 MG: ORAL | 20 days supply | Qty: 60 | Fill #1 | Status: AC

## 2021-06-27 ENCOUNTER — Other Ambulatory Visit (HOSPITAL_COMMUNITY): Payer: Self-pay

## 2021-06-27 MED FILL — OnabotulinumtoxinA For Inj 100 Unit: INTRAMUSCULAR | 84 days supply | Qty: 1 | Fill #0 | Status: AC

## 2021-06-28 ENCOUNTER — Other Ambulatory Visit (HOSPITAL_COMMUNITY): Payer: Self-pay

## 2021-07-01 ENCOUNTER — Encounter: Payer: Self-pay | Admitting: Physician Assistant

## 2021-07-01 ENCOUNTER — Other Ambulatory Visit: Payer: Self-pay

## 2021-07-01 ENCOUNTER — Other Ambulatory Visit (HOSPITAL_COMMUNITY): Payer: Self-pay

## 2021-07-01 ENCOUNTER — Ambulatory Visit (INDEPENDENT_AMBULATORY_CARE_PROVIDER_SITE_OTHER): Payer: 59 | Admitting: Physician Assistant

## 2021-07-01 DIAGNOSIS — G43009 Migraine without aura, not intractable, without status migrainosus: Secondary | ICD-10-CM | POA: Diagnosis not present

## 2021-07-01 DIAGNOSIS — M62838 Other muscle spasm: Secondary | ICD-10-CM

## 2021-07-01 MED ORDER — BACLOFEN 10 MG PO TABS
ORAL_TABLET | Freq: Three times a day (TID) | ORAL | 3 refills | Status: DC | PRN
  Filled 2021-07-01 – 2021-07-19 (×2): qty 60, 20d supply, fill #0
  Filled 2021-09-02: qty 60, 20d supply, fill #1

## 2021-07-01 MED ORDER — TRAMADOL HCL 50 MG PO TABS
50.0000 mg | ORAL_TABLET | Freq: Four times a day (QID) | ORAL | 0 refills | Status: DC | PRN
  Filled 2021-07-01: qty 6, 2d supply, fill #0

## 2021-07-01 MED ORDER — ONABOTULINUMTOXINA 100 UNITS IJ SOLR
100.0000 [IU] | Freq: Once | INTRAMUSCULAR | Status: AC
  Administered 2021-07-01: 100 [IU] via INTRAMUSCULAR

## 2021-07-01 MED ORDER — IBUPROFEN 800 MG PO TABS
800.0000 mg | ORAL_TABLET | Freq: Three times a day (TID) | ORAL | 3 refills | Status: AC | PRN
  Filled 2021-07-01: qty 60, 20d supply, fill #0
  Filled 2022-02-24: qty 60, 20d supply, fill #1

## 2021-07-01 MED ORDER — CELECOXIB 100 MG PO CAPS
100.0000 mg | ORAL_CAPSULE | Freq: Two times a day (BID) | ORAL | 3 refills | Status: DC
  Filled 2021-07-01: qty 60, 30d supply, fill #0

## 2021-07-01 NOTE — Progress Notes (Signed)
S: Pt in office today for Botox injections. Her Botox has been working very well for migraine prevention. She is using 100 units every 3 months however she had to cancel her last appointment due to an emergency with her daughter.  She states the Nurtec every other day has been helpful and got her through that time without the botox.  Bernita Raisin takes too long to do anything and she no longer wants that as an option for headache management.    O: BP 105/70   Pulse 78    Botox Procedure Note Vial of Botox was : Supplied by Patient   Botox Dosing by Muscle Group for Chronic Migraine   Injection Sites for Migraines  Botox 100 units was injected using the dosage in the table above in the pattern shown above.  A: Migraine  Muscle spasm   P: Botox 100 units injected today.  Meds refilled as needed RTC 3 Months.

## 2021-07-01 NOTE — Patient Instructions (Signed)
OnabotulinumtoxinA injection (Medical Use) What is this medication? ONABOTULINUMTOXINA (o na BOTT you lye num tox in eh) is a neuro-muscular blocker. This medicine is used to treat crossed eyes, eyelid spasms, severe neck muscle spasms, ankle and toe muscle spasms, and elbow, wrist, and finger muscle spasms. It is also used to treat excessive underarm sweating, to prevent chronic migraine headaches, and to treat loss of bladder control due to neurologic conditions such as multiple sclerosis or spinal cord injury. This medicine may be used for other purposes; ask your health care provider or pharmacist if you have questions. COMMON BRAND NAME(S): Botox What should I tell my care team before I take this medication? They need to know if you have any of these conditions: breathing problems cerebral palsy spasms difficulty urinating heart problems history of surgery where this medicine is going to be used infection at the site where this medicine is going to be used myasthenia gravis or other neurologic disease nerve or muscle disease surgery plans take medicines that treat or prevent blood clots thyroid problems an unusual or allergic reaction to botulinum toxin, albumin, other medicines, foods, dyes, or preservatives pregnant or trying to get pregnant breast-feeding How should I use this medication? This medicine is for injection into a muscle. It is given by a health care professional in a hospital or clinic setting. Talk to your pediatrician regarding the use of this medicine in children. While this drug may be prescribed for children as young as 2 years old for selected conditions, precautions do apply. Overdosage: If you think you have taken too much of this medicine contact a poison control center or emergency room at once. NOTE: This medicine is only for you. Do not share this medicine with others. What if I miss a dose? This does not apply. What may interact with this  medication? aminoglycoside antibiotics like gentamicin, neomycin, tobramycin muscle relaxants other botulinum toxin injections This list may not describe all possible interactions. Give your health care provider a list of all the medicines, herbs, non-prescription drugs, or dietary supplements you use. Also tell them if you smoke, drink alcohol, or use illegal drugs. Some items may interact with your medicine. What should I watch for while using this medication? Visit your doctor for regular check ups. This medicine will cause weakness in the muscle where it is injected. Tell your doctor if you feel unusually weak in other muscles. Get medical help right away if you have problems with breathing, swallowing, or talking. This medicine might make your eyelids droop or make you see blurry or double. If you have weak muscles or trouble seeing do not drive a car, use machinery, or do other dangerous activities. This medicine contains albumin from human blood. It may be possible to pass an infection in this medicine, but no cases have been reported. Talk to your doctor about the risks and benefits of this medicine. If your activities have been limited by your condition, go back to your regular routine slowly after treatment with this medicine. What side effects may I notice from receiving this medication? Side effects that you should report to your doctor or health care professional as soon as possible: allergic reactions like skin rash, itching or hives, swelling of the face, lips, or tongue breathing problems changes in vision chest pain or tightness eye irritation, pain fast, irregular heartbeat infection numbness speech problems swallowing problems unusual weakness Side effects that usually do not require medical attention (report to your doctor or health care professional   if they continue or are bothersome): bruising or pain at site where injected drooping eyelid dry eyes or  mouth headache muscles aches, pains sensitivity to light tearing This list may not describe all possible side effects. Call your doctor for medical advice about side effects. You may report side effects to FDA at 1-800-FDA-1088. Where should I keep my medication? This drug is given in a hospital or clinic and will not be stored at home. NOTE: This sheet is a summary. It may not cover all possible information. If you have questions about this medicine, talk to your doctor, pharmacist, or health care provider.  2022 Elsevier/Gold Standard (2018-06-03 14:21:42)  

## 2021-07-02 ENCOUNTER — Other Ambulatory Visit (HOSPITAL_COMMUNITY): Payer: Self-pay

## 2021-07-05 ENCOUNTER — Telehealth: Payer: Self-pay | Admitting: Cardiovascular Disease

## 2021-07-05 ENCOUNTER — Other Ambulatory Visit (HOSPITAL_COMMUNITY): Payer: Self-pay

## 2021-07-05 ENCOUNTER — Other Ambulatory Visit: Payer: Self-pay

## 2021-07-05 MED ORDER — AMLODIPINE BESYLATE 5 MG PO TABS
ORAL_TABLET | Freq: Every day | ORAL | 0 refills | Status: DC
  Filled 2021-07-05: qty 30, 30d supply, fill #0

## 2021-07-05 MED ORDER — ROSUVASTATIN CALCIUM 40 MG PO TABS
ORAL_TABLET | Freq: Every day | ORAL | 2 refills | Status: DC
  Filled 2021-07-05: qty 30, 30d supply, fill #0
  Filled 2021-08-16 – 2021-08-29 (×2): qty 30, 30d supply, fill #1
  Filled 2021-10-06: qty 30, 30d supply, fill #2

## 2021-07-05 NOTE — Telephone Encounter (Signed)
*  STAT* If patient is at the pharmacy, call can be transferred to refill team.   1. Which medications need to be refilled? (please list name of each medication and dose if known)  amLODipine (NORVASC) 5 MG tablet rosuvastatin (CRESTOR) 40 MG tablet  2. Which pharmacy/location (including street and city if local pharmacy) is medication to be sent to? [facility]  3. Do they need a 30 day or 90 day supply? 90 day   Patient is completely out of medication. She has an appointment 09/23/2021

## 2021-07-11 ENCOUNTER — Other Ambulatory Visit: Payer: Self-pay | Admitting: Family Medicine

## 2021-07-11 ENCOUNTER — Other Ambulatory Visit (HOSPITAL_COMMUNITY): Payer: Self-pay

## 2021-07-11 DIAGNOSIS — F418 Other specified anxiety disorders: Secondary | ICD-10-CM

## 2021-07-11 MED ORDER — HYDROXYZINE PAMOATE 50 MG PO CAPS
ORAL_CAPSULE | ORAL | 0 refills | Status: DC
  Filled 2021-07-11: qty 180, 30d supply, fill #0

## 2021-07-19 ENCOUNTER — Other Ambulatory Visit: Payer: Self-pay | Admitting: Family Medicine

## 2021-07-19 ENCOUNTER — Other Ambulatory Visit (HOSPITAL_COMMUNITY): Payer: Self-pay

## 2021-07-19 DIAGNOSIS — F419 Anxiety disorder, unspecified: Secondary | ICD-10-CM

## 2021-07-19 MED ORDER — SERTRALINE HCL 100 MG PO TABS
150.0000 mg | ORAL_TABLET | Freq: Every day | ORAL | 1 refills | Status: DC
  Filled 2021-07-19: qty 45, 30d supply, fill #0
  Filled 2021-08-26: qty 45, 30d supply, fill #1

## 2021-07-19 MED FILL — Pantoprazole Sodium EC Tab 40 MG (Base Equiv): ORAL | 90 days supply | Qty: 90 | Fill #1 | Status: AC

## 2021-07-20 ENCOUNTER — Ambulatory Visit: Payer: 59 | Admitting: Family Medicine

## 2021-07-20 ENCOUNTER — Other Ambulatory Visit (HOSPITAL_COMMUNITY): Payer: Self-pay

## 2021-07-20 NOTE — Progress Notes (Deleted)
   I, Christoper Fabian, LAT, ATC, am serving as scribe for Dr. [name].  [name] is a 60 y.o. female who presents to [facility] today for f/u of R hip, buttock and low back pain thought to be due to hip abductor tendinopathy weakness and greater trochanteric bursitis.  She was last seen by Dr. Denyse Amass on 05/27/21 and was referred to [facility] but did not completed any visits.  Today, pt reports   Diagnostic testing: R hip XR- 05/27/21  Pertinent review of systems: ***  Relevant historical information: ***   Exam:  There were no vitals taken for this visit. General: Well Developed, well nourished, and in no acute distress.   MSK: ***    Lab and Radiology Results No results found for this or any previous visit (from the past 72 hour(s)). No results found.     Assessment and Plan: 60 y.o. female with ***   PDMP not reviewed this encounter. No orders of the defined types were placed in this encounter.  No orders of the defined types were placed in this encounter.    Discussed warning signs or symptoms. Please see discharge instructions. Patient expresses understanding.   ***

## 2021-07-28 ENCOUNTER — Other Ambulatory Visit: Payer: Self-pay

## 2021-07-28 ENCOUNTER — Ambulatory Visit (INDEPENDENT_AMBULATORY_CARE_PROVIDER_SITE_OTHER): Payer: 59 | Admitting: Family Medicine

## 2021-07-28 ENCOUNTER — Encounter: Payer: Self-pay | Admitting: Family Medicine

## 2021-07-28 ENCOUNTER — Other Ambulatory Visit (HOSPITAL_COMMUNITY): Payer: Self-pay

## 2021-07-28 VITALS — BP 108/80 | Ht 64.0 in | Wt 170.0 lb

## 2021-07-28 DIAGNOSIS — S39012A Strain of muscle, fascia and tendon of lower back, initial encounter: Secondary | ICD-10-CM

## 2021-07-28 MED ORDER — HYDROCODONE-ACETAMINOPHEN 5-325 MG PO TABS
1.0000 | ORAL_TABLET | Freq: Three times a day (TID) | ORAL | 0 refills | Status: DC | PRN
  Filled 2021-07-28: qty 15, 5d supply, fill #0

## 2021-07-28 MED ORDER — KETOROLAC TROMETHAMINE 30 MG/ML IJ SOLN
30.0000 mg | Freq: Once | INTRAMUSCULAR | Status: AC
  Administered 2021-07-28: 30 mg via INTRAMUSCULAR

## 2021-07-28 MED ORDER — METHYLPREDNISOLONE ACETATE 40 MG/ML IJ SUSP
40.0000 mg | Freq: Once | INTRAMUSCULAR | Status: AC
  Administered 2021-07-28: 40 mg via INTRAMUSCULAR

## 2021-07-28 MED FILL — Tizanidine HCl Tab 4 MG (Base Equivalent): ORAL | 30 days supply | Qty: 90 | Fill #1 | Status: AC

## 2021-07-28 NOTE — Patient Instructions (Signed)
Good to see you Please continue the ice  Please try the stretches and exercise  Please continue the baclofen   Please send me a message in MyChart with any questions or updates.  Please see me back in 1-2 weeks or as needed if better.   --Dr. Jordan Likes

## 2021-07-28 NOTE — Progress Notes (Signed)
[name] - 60 y.o. female MRN [id number]  Date of birth: March 10, 1961  SUBJECTIVE:  Including CC & ROS.  No chief complaint on file.   [name] is a 60 y.o. female that is presenting with left-sided lower back pain.  The pain has been acutely occurring over the past few days.  No radicular pain.  Has been taking ibuprofen.   Review of Systems See HPI   HISTORY: Past Medical, Surgical, Social, and Family History Reviewed & Updated per EMR.   Pertinent Historical Findings include:  Past Medical History:  Diagnosis Date   Allergy    seasonal allergies   Anxiety    on meds   Arthritis    on meds   Depression    on meds   Dizziness 03/05/2017   Essential hypertension 03/05/2017   on meds   GERD (gastroesophageal reflux disease)    on meds   Hyperlipidemia 03/05/2017   on meds   Infertility, female    Migraines    Osteopenia     Past Surgical History:  Procedure Laterality Date   ANKLE SURGERY Left 1979   COLONOSCOPY  2011   MS-F/V-moviprep(exc)-normal -10 yr recall   SHOULDER SURGERY Left 1990    Family History  Problem Relation Age of Onset   Hypertension Mother    Allergic rhinitis Mother    Sleep apnea Mother    Hyperlipidemia Mother    Cancer Maternal Grandmother    Stroke Other    Eczema Daughter    Alcohol abuse Father    Stroke Father    Heart disease Maternal Grandfather    Alcohol abuse Paternal Grandfather    Colon polyps Neg Hx    Colon cancer Neg Hx    Esophageal cancer Neg Hx    Rectal cancer Neg Hx    Stomach cancer Neg Hx     Social History   Socioeconomic History   Marital status: Legally Separated    Spouse name: Not on file   Number of children: Not on file   Years of education: Not on file   Highest education level: Not on file  Occupational History    Employer: [employer]    Comment: [employer]  Tobacco Use   Smoking status: Former    Types: Cigarettes    Quit date: 02/09/1992    Years since quitting:  29.4   Smokeless tobacco: Never  Vaping Use   Vaping Use: Never used  Substance and Sexual Activity   Alcohol use: Yes    Alcohol/week: 12.0 standard drinks    Types: 12 Standard drinks or equivalent per week   Drug use: No   Sexual activity: Yes    Partners: Male  Other Topics Concern   Not on file  Social History Narrative   Not on file   Social Determinants of Health   Financial Resource Strain: Not on file  Food Insecurity: Not on file  Transportation Needs: Not on file  Physical Activity: Not on file  Stress: Not on file  Social Connections: Not on file  Intimate Partner Violence: Not on file     PHYSICAL EXAM:  VS: BP 108/80 (BP Location: Left Arm, Patient Position: Sitting, Cuff Size: Normal)   Ht 5\' 4"  (1.626 m)   Wt 170 lb (77.1 kg)   BMI 29.18 kg/m  Physical Exam Gen: NAD, alert, cooperative with exam, well-appearing MSK:  Back: Some tenderness to palpation over the left SI joint. Normal range of motion.  Normal strength resistance. Neurovascular intact     ASSESSMENT & PLAN:   Strain of lumbar region Symptoms most consistent with a lumbar spasm.  No radicular pain.  May have some underlying SI joint involvement. -Counseled on home exercise therapy and supportive care. -Norco. -IM Toradol and Depo-Medrol -Could consider physical therapy or SI joint injection.

## 2021-07-29 DIAGNOSIS — S39012A Strain of muscle, fascia and tendon of lower back, initial encounter: Secondary | ICD-10-CM | POA: Insufficient documentation

## 2021-07-29 NOTE — Assessment & Plan Note (Signed)
Symptoms most consistent with a lumbar spasm.  No radicular pain.  May have some underlying SI joint involvement. -Counseled on home exercise therapy and supportive care. -Norco. -IM Toradol and Depo-Medrol -Could consider physical therapy or SI joint injection.

## 2021-08-04 ENCOUNTER — Telehealth: Payer: Self-pay | Admitting: Radiology

## 2021-08-04 NOTE — Telephone Encounter (Signed)
Left message for patient to call [facility] to schedule botox injection for Headache in October

## 2021-08-16 ENCOUNTER — Other Ambulatory Visit (HOSPITAL_COMMUNITY): Payer: Self-pay

## 2021-08-16 ENCOUNTER — Other Ambulatory Visit: Payer: Self-pay | Admitting: Cardiovascular Disease

## 2021-08-16 MED ORDER — AMLODIPINE BESYLATE 5 MG PO TABS
5.0000 mg | ORAL_TABLET | Freq: Every day | ORAL | 0 refills | Status: DC
  Filled 2021-08-16: qty 30, 30d supply, fill #0

## 2021-08-25 ENCOUNTER — Other Ambulatory Visit (HOSPITAL_COMMUNITY): Payer: Self-pay

## 2021-08-26 ENCOUNTER — Other Ambulatory Visit (HOSPITAL_COMMUNITY): Payer: Self-pay

## 2021-08-29 ENCOUNTER — Other Ambulatory Visit (HOSPITAL_COMMUNITY): Payer: Self-pay

## 2021-09-02 ENCOUNTER — Other Ambulatory Visit: Payer: Self-pay | Admitting: Physician Assistant

## 2021-09-02 ENCOUNTER — Other Ambulatory Visit (HOSPITAL_COMMUNITY): Payer: Self-pay

## 2021-09-02 ENCOUNTER — Encounter: Payer: Self-pay | Admitting: Family Medicine

## 2021-09-02 ENCOUNTER — Other Ambulatory Visit (HOSPITAL_COMMUNITY)
Admission: RE | Admit: 2021-09-02 | Discharge: 2021-09-02 | Disposition: A | Discharge status: Home / Self Care | Payer: 59 | Source: Ambulatory Visit | Attending: Family Medicine | Admitting: Family Medicine

## 2021-09-02 ENCOUNTER — Ambulatory Visit (INDEPENDENT_AMBULATORY_CARE_PROVIDER_SITE_OTHER): Payer: 59 | Admitting: Family Medicine

## 2021-09-02 ENCOUNTER — Other Ambulatory Visit: Payer: Self-pay

## 2021-09-02 VITALS — BP 110/70 | HR 55 | Temp 97.6°F | Resp 18 | Ht 64.0 in | Wt 168.0 lb

## 2021-09-02 DIAGNOSIS — K219 Gastro-esophageal reflux disease without esophagitis: Secondary | ICD-10-CM

## 2021-09-02 DIAGNOSIS — F322 Major depressive disorder, single episode, severe without psychotic features: Secondary | ICD-10-CM

## 2021-09-02 DIAGNOSIS — F419 Anxiety disorder, unspecified: Secondary | ICD-10-CM

## 2021-09-02 DIAGNOSIS — F418 Other specified anxiety disorders: Secondary | ICD-10-CM

## 2021-09-02 DIAGNOSIS — Z0000 Encounter for general adult medical examination without abnormal findings: Secondary | ICD-10-CM | POA: Insufficient documentation

## 2021-09-02 DIAGNOSIS — G4700 Insomnia, unspecified: Secondary | ICD-10-CM

## 2021-09-02 DIAGNOSIS — I10 Essential (primary) hypertension: Secondary | ICD-10-CM | POA: Diagnosis not present

## 2021-09-02 DIAGNOSIS — E7800 Pure hypercholesterolemia, unspecified: Secondary | ICD-10-CM | POA: Diagnosis not present

## 2021-09-02 LAB — LIPID PANEL
Cholesterol: 222 mg/dL — ABNORMAL HIGH (ref 0–200)
HDL: 53.9 mg/dL (ref >39.00)
NonHDL: 167.62
Total CHOL/HDL Ratio: 4
Triglycerides: 292 mg/dL — ABNORMAL HIGH (ref 0.0–149.0)
VLDL: 58.4 mg/dL — ABNORMAL HIGH (ref 0.0–40.0)

## 2021-09-02 LAB — CBC WITH DIFFERENTIAL/PLATELET
Basophils Absolute: 0 10*3/uL (ref 0.0–0.1)
Basophils Relative: 0.4 % (ref 0.0–3.0)
Eosinophils Absolute: 0.2 10*3/uL (ref 0.0–0.7)
Eosinophils Relative: 3.2 % (ref 0.0–5.0)
HCT: 37.9 % (ref 36.0–46.0)
Hemoglobin: 12.7 g/dL (ref 12.0–15.0)
Lymphocytes Relative: 28.3 % (ref 12.0–46.0)
Lymphs Abs: 1.9 10*3/uL (ref 0.7–4.0)
MCHC: 33.4 g/dL (ref 30.0–36.0)
MCV: 97.1 fl (ref 78.0–100.0)
Monocytes Absolute: 0.5 10*3/uL (ref 0.1–1.0)
Monocytes Relative: 7.4 % (ref 3.0–12.0)
Neutro Abs: 4.1 10*3/uL (ref 1.4–7.7)
Neutrophils Relative %: 60.7 % (ref 43.0–77.0)
Platelets: 277 10*3/uL (ref 150.0–400.0)
RBC: 3.9 Mil/uL (ref 3.87–5.11)
RDW: 14.4 % (ref 11.5–15.5)
WBC: 6.8 10*3/uL (ref 4.0–10.5)

## 2021-09-02 LAB — COMPREHENSIVE METABOLIC PANEL
ALT: 39 U/L — ABNORMAL HIGH (ref 0–35)
AST: 32 U/L (ref 0–37)
Albumin: 4.1 g/dL (ref 3.5–5.2)
Alkaline Phosphatase: 61 U/L (ref 39–117)
BUN: 17 mg/dL (ref 6–23)
CO2: 24 mEq/L (ref 19–32)
Calcium: 9.3 mg/dL (ref 8.4–10.5)
Chloride: 106 mEq/L (ref 96–112)
Creatinine, Ser: 0.82 mg/dL (ref 0.40–1.20)
GFR: 76.75 mL/min (ref >60.00)
Glucose, Bld: 92 mg/dL (ref 70–99)
Potassium: 3.5 mEq/L (ref 3.5–5.1)
Sodium: 139 mEq/L (ref 135–145)
Total Bilirubin: 0.4 mg/dL (ref 0.2–1.2)
Total Protein: 6.4 g/dL (ref 6.0–8.3)

## 2021-09-02 LAB — TSH: TSH: 1.84 u[IU]/mL (ref 0.35–5.50)

## 2021-09-02 LAB — LDL CHOLESTEROL, DIRECT: Direct LDL: 137 mg/dL

## 2021-09-02 MED ORDER — TRAZODONE HCL 50 MG PO TABS
ORAL_TABLET | Freq: Every evening | ORAL | 0 refills | Status: DC | PRN
  Filled 2021-09-02: qty 30, 30d supply, fill #0

## 2021-09-02 MED ORDER — FLUTICASONE PROPIONATE 50 MCG/ACT NA SUSP
2.0000 | Freq: Every day | NASAL | 1 refills | Status: DC
  Filled 2021-09-02: qty 16, 30d supply, fill #0
  Filled 2021-11-22: qty 16, 30d supply, fill #1

## 2021-09-02 MED ORDER — PANTOPRAZOLE SODIUM 40 MG PO TBEC
DELAYED_RELEASE_TABLET | Freq: Every day | ORAL | 3 refills | Status: DC
  Filled 2021-09-02: qty 90, fill #0
  Filled 2021-11-01: qty 90, 90d supply, fill #0
  Filled 2022-01-19: qty 90, 90d supply, fill #1
  Filled 2022-02-24 – 2022-05-12 (×2): qty 90, 90d supply, fill #2
  Filled 2022-06-12 – 2022-08-14 (×2): qty 90, 90d supply, fill #3

## 2021-09-02 MED ORDER — CELECOXIB 100 MG PO CAPS
100.0000 mg | ORAL_CAPSULE | Freq: Two times a day (BID) | ORAL | 3 refills | Status: DC
  Filled 2021-09-02: qty 60, 30d supply, fill #0
  Filled 2021-10-06: qty 60, 30d supply, fill #1
  Filled 2021-11-22: qty 60, 30d supply, fill #2
  Filled 2022-01-19: qty 60, 30d supply, fill #3

## 2021-09-02 MED ORDER — HYDROXYZINE PAMOATE 50 MG PO CAPS
ORAL_CAPSULE | ORAL | 0 refills | Status: DC
  Filled 2021-09-02: qty 180, 30d supply, fill #0

## 2021-09-02 MED ORDER — REYVOW 100 MG PO TABS
100.0000 mg | ORAL_TABLET | ORAL | 2 refills | Status: DC | PRN
  Filled 2021-09-02: qty 8, 8d supply, fill #0

## 2021-09-02 MED ORDER — SERTRALINE HCL 100 MG PO TABS
150.0000 mg | ORAL_TABLET | Freq: Every day | ORAL | 1 refills | Status: DC
  Filled 2021-09-02 – 2021-10-06 (×2): qty 45, 30d supply, fill #0
  Filled 2021-11-01: qty 45, 30d supply, fill #1

## 2021-09-02 NOTE — Assessment & Plan Note (Signed)
ghm utd Check labs  See avs Pt does not want flu shot or covid vaccine

## 2021-09-02 NOTE — Patient Instructions (Signed)
Preventive Care 40-62 Years Old, Female Preventive care refers to lifestyle choices and visits with your health care provider that can promote health and wellness. This includes: A yearly physical exam. This is also called an annual wellness visit. Regular dental and eye exams. Immunizations. Screening for certain conditions. Healthy lifestyle choices, such as: Eating a healthy diet. Getting regular exercise. Not using drugs or products that contain nicotine and tobacco. Limiting alcohol use. What can I expect for my preventive care visit? Physical exam Your health care provider will check your: Height and weight. These may be used to calculate your BMI (body mass index). BMI is a measurement that tells if you are at a healthy weight. Heart rate and blood pressure. Body temperature. Skin for abnormal spots. Counseling Your health care provider may ask you questions about your: Past medical problems. Family's medical history. Alcohol, tobacco, and drug use. Emotional well-being. Home life and relationship well-being. Sexual activity. Diet, exercise, and sleep habits. Work and work environment. Access to firearms. Method of birth control. Menstrual cycle. Pregnancy history. What immunizations do I need? Vaccines are usually given at various ages, according to a schedule. Your health care provider will recommend vaccines for you based on your age, medical history, and lifestyle or other factors, such as travel or where you work. What tests do I need? Blood tests Lipid and cholesterol levels. These may be checked every 5 years, or more often if you are over 50 years old. Hepatitis C test. Hepatitis B test. Screening Lung cancer screening. You may have this screening every year starting at age 55 if you have a 30-pack-year history of smoking and currently smoke or have quit within the past 15 years. Colorectal cancer screening. All adults should have this screening starting at  age 50 and continuing until age 75. Your health care provider may recommend screening at age 45 if you are at increased risk. You will have tests every 1-10 years, depending on your results and the type of screening test. Diabetes screening. This is done by checking your blood sugar (glucose) after you have not eaten for a while (fasting). You may have this done every 1-3 years. Mammogram. This may be done every 1-2 years. Talk with your health care provider about when you should start having regular mammograms. This may depend on whether you have a family history of breast cancer. BRCA-related cancer screening. This may be done if you have a family history of breast, ovarian, tubal, or peritoneal cancers. Pelvic exam and Pap test. This may be done every 3 years starting at age 21. Starting at age 30, this may be done every 5 years if you have a Pap test in combination with an HPV test. Other tests STD (sexually transmitted disease) testing, if you are at risk. Bone density scan. This is done to screen for osteoporosis. You may have this scan if you are at high risk for osteoporosis. Talk with your health care provider about your test results, treatment options, and if necessary, the need for more tests. Follow these instructions at home: Eating and drinking  Eat a diet that includes fresh fruits and vegetables, whole grains, lean protein, and low-fat dairy products. Take vitamin and mineral supplements as recommended by your health care provider. Do not drink alcohol if: Your health care provider tells you not to drink. You are pregnant, may be pregnant, or are planning to become pregnant. If you drink alcohol: Limit how much you have to 0-1 drink a day. Be   aware of how much alcohol is in your drink. In the [place] one drink equals one 12 oz bottle of beer (355 mL), one 5 oz glass of wine (148 mL), or one 1 oz glass of hard liquor (44 mL). Lifestyle Take daily care of your teeth and  gums. Brush your teeth every morning and night with fluoride toothpaste. Floss one time each day. Stay active. Exercise for at least 30 minutes 5 or more days each week. Do not use any products that contain nicotine or tobacco, such as cigarettes, e-cigarettes, and chewing tobacco. If you need help quitting, ask your health care provider. Do not use drugs. If you are sexually active, practice safe sex. Use a condom or other form of protection to prevent STIs (sexually transmitted infections). If you do not wish to become pregnant, use a form of birth control. If you plan to become pregnant, see your health care provider for a prepregnancy visit. If told by your health care provider, take low-dose aspirin daily starting at age 63. Find healthy ways to cope with stress, such as: Meditation, yoga, or listening to music. Journaling. Talking to a trusted person. Spending time with friends and family. Safety Always wear your seat belt while driving or riding in a vehicle. Do not drive: If you have been drinking alcohol. Do not ride with someone who has been drinking. When you are tired or distracted. While texting. Wear a helmet and other protective equipment during sports activities. If you have firearms in your house, make sure you follow all gun safety procedures. What's next? Visit your health care provider once a year for an annual wellness visit. Ask your health care provider how often you should have your eyes and teeth checked. Stay up to date on all vaccines. This information is not intended to replace advice given to you by your health care provider. Make sure you discuss any questions you have with your health care provider. Document Revised: 02/04/2021 Document Reviewed: 08/08/2018 Elsevier Patient Education  2022 [employer].

## 2021-09-02 NOTE — Progress Notes (Signed)
Subjective:   By signing my name below, I, [name], attest that this documentation has been prepared under the direction and in the presence of Donato Schultz, DO. 09/02/2021   Patient ID: [name], female    DOB: [birth year], 60 y.o.   MRN: [id number]  No chief complaint on file.   HPI Patient is in today for a comprehensive physical exam  She denies fever, hearing loss, ear pain,congestion, sinus pain, sore throat, eye pain, chest pain, palpitations, cough, shortness of breath, wheezing, nausea. vomiting, diarrhea, constipation, blood in stool, dysuria,frequency, hematuria and headaches.   Past Medical History:  Diagnosis Date   Allergy    seasonal allergies   Anxiety    on meds   Arthritis    on meds   Depression    on meds   Dizziness 03/05/2017   Essential hypertension 03/05/2017   on meds   GERD (gastroesophageal reflux disease)    on meds   Hyperlipidemia 03/05/2017   on meds   Infertility, female    Migraines    Osteopenia     Past Surgical History:  Procedure Laterality Date   ANKLE SURGERY Left 1979   COLONOSCOPY  2011   MS-F/V-moviprep(exc)-normal -10 yr recall   SHOULDER SURGERY Left 1990    Family History  Problem Relation Age of Onset   Hypertension Mother    Allergic rhinitis Mother    Sleep apnea Mother    Hyperlipidemia Mother    Cancer Maternal Grandmother    Stroke Other    Eczema Daughter    Alcohol abuse Father    Stroke Father    Heart disease Maternal Grandfather    Alcohol abuse Paternal Grandfather    Colon polyps Neg Hx    Colon cancer Neg Hx    Esophageal cancer Neg Hx    Rectal cancer Neg Hx    Stomach cancer Neg Hx     Social History   Socioeconomic History   Marital status: Legally Separated    Spouse name: Not on file   Number of children: Not on file   Years of education: Not on file   Highest education level: Not on file  Occupational History    Employer: [employer]    Comment: [place]  [employer]  Tobacco Use   Smoking status: Former    Types: Cigarettes    Quit date: 02/09/1992    Years since quitting: 29.5   Smokeless tobacco: Never  Vaping Use   Vaping Use: Never used  Substance and Sexual Activity   Alcohol use: Yes    Alcohol/week: 12.0 standard drinks    Types: 12 Standard drinks or equivalent per week   Drug use: No   Sexual activity: Yes    Partners: Male  Other Topics Concern   Not on file  Social History Narrative   Not on file   Social Determinants of Health   Financial Resource Strain: Not on file  Food Insecurity: Not on file  Transportation Needs: Not on file  Physical Activity: Not on file  Stress: Not on file  Social Connections: Not on file  Intimate Partner Violence: Not on file    Outpatient Medications Prior to Visit  Medication Sig Dispense Refill   amLODipine (NORVASC) 5 MG tablet Take 1 tablet (5 mg total) by mouth daily. Please keep upcoming appt for future refills 30 tablet 0   Ascorbic Acid (VITAMIN C PO) Take by mouth.     aspirin EC  81 MG tablet Take 81 mg by mouth daily.     baclofen (LIORESAL) 10 MG tablet TAKE 1 TABLET BY MOUTH EVERY 8 HOURS AS NEEDED 60 tablet 3   BOTOX 100 units SOLR injection INJECT 100 UNITS INTO THE MUSCLE EVERY 3 MONTHS. 1 each 3   Calcium Carbonate (CALCIUM 600 PO) Take by mouth.     cefdinir (OMNICEF) 300 MG capsule Take 1 capsule (300 mg total) by mouth 2 (two) times daily. 20 capsule 0   celecoxib (CELEBREX) 100 MG capsule Take 1 capsule (100 mg total) by mouth 2 (two) times daily. 60 capsule 3   fluconazole (DIFLUCAN) 150 MG tablet TAKE 1 TABLET BY MOUTH ON DAY 1 AND REPEAT IN 3 DAYS 2 tablet 0   fluticasone (FLONASE) 50 MCG/ACT nasal spray Place 2 sprays into both nostrils daily. 16 g 1   Fluticasone Propionate (FLONASE NA) Place into the nose.     Galcanezumab-gnlm (EMGALITY) 120 MG/ML SOAJ INJECT 1 PEN EVERY 30 DAYS 1 mL 11   HYDROcodone-acetaminophen (NORCO/VICODIN) 5-325 MG tablet Take 1  tablet by mouth every 8  hours as needed. 15 tablet 0   hydrOXYzine (VISTARIL) 50 MG capsule TAKE 2 CAPSULES BY MOUTH 3 TIMES DAILY AS NEEDED FOR ITCHING 180 capsule 0   ibuprofen (ADVIL) 800 MG tablet Take 1 tablet (800 mg total) by mouth every 8 (eight) hours as needed. 60 tablet 3   magnesium oxide (MAG-OX) 400 MG tablet Take 400 mg by mouth daily.     Multiple Vitamins-Minerals (ZINC PO) Take by mouth.     naratriptan (AMERGE) 2.5 MG tablet TAKE 1 TABLET BY MOUTH AT ONSET OF HEADACHE. IF RETURNS OR DOES NOT RESOLVE MAY REPEAT AFTER 4 HOURS. DO NOT EXCEED 2 TABLETS IN 24 HOURS 10 tablet 11   NONFORMULARY OR COMPOUNDED ITEM Gaba Calm as needed for anxiety     nystatin (MYCOSTATIN) 100000 UNIT/ML suspension Take 5 mLs by mouth four times daily. 60 mL 0   ondansetron (ZOFRAN) 8 MG tablet TAKE 1 TABLET BY MOUTH EVERY 8 HOURS AS NEEDED 20 tablet 3   pantoprazole (PROTONIX) 40 MG tablet TAKE 1 TABLET BY MOUTH ONCE DAILY 90 tablet 3   Probiotic Product (PROBIOTIC DAILY PO) Take by mouth.     Pseudoephedrine HCl (SUDAFED PO) Take 1 tablet by mouth daily as needed.     RESTASIS 0.05 % ophthalmic emulsion Place 1 drop into both eyes 2 (two) times daily.     Rimegepant Sulfate (NURTEC) 75 MG TBDP Take 75 mg by mouth every other day. 16 tablet 11   rosuvastatin (CRESTOR) 40 MG tablet TAKE 1 TABLET BY MOUTH ONCE DAILY 30 tablet 2   sertraline (ZOLOFT) 100 MG tablet Take 1 and 1/2 tablets by mouth daily. 45 tablet 1   tiZANidine (ZANAFLEX) 4 MG tablet TAKE 1 TABLET BY MOUTH 3 TIMES DAILY 90 tablet 2   topiramate (TOPAMAX) 200 MG tablet TAKE 1 TABLET BY MOUTH ONCE DAILY 30 tablet 5   traZODone (DESYREL) 50 MG tablet TAKE 1/2 TO 1 TABLET BY MOUTH AT BEDTIME AS NEEDED FOR SLEEP 30 tablet 0   UNABLE TO FIND CBD 50 mg     vitamin B-12 (CYANOCOBALAMIN) 500 MCG tablet Take 500 mcg by mouth daily.     VITAMIN D PO Take by mouth.     No facility-administered medications prior to visit.    Allergies  Allergen  Reactions   Augmentin [Amoxicillin-Pot Clavulanate]     diarrhea  Review of Systems  Constitutional:  Negative for fever.  HENT:  Negative for congestion, ear pain, hearing loss, sinus pain and sore throat.   Eyes:  Negative for blurred vision and pain.  Respiratory:  Negative for cough, sputum production, shortness of breath and wheezing.   Cardiovascular:  Negative for chest pain and palpitations.  Gastrointestinal:  Negative for blood in stool, constipation, diarrhea, nausea and vomiting.  Genitourinary:  Negative for dysuria, frequency, hematuria and urgency.  Musculoskeletal:  Negative for back pain, falls and myalgias.  Neurological:  Negative for dizziness, sensory change, loss of consciousness, weakness and headaches.  Endo/Heme/Allergies:  Negative for environmental allergies. Does not bruise/bleed easily.  Psychiatric/Behavioral:  Negative for depression and suicidal ideas. The patient is not nervous/anxious and does not have insomnia.       Objective:    Physical Exam Constitutional:      General: She is not in acute distress.    Appearance: Normal appearance. She is not ill-appearing.  HENT:     Head: Normocephalic and atraumatic.     Right Ear: External ear normal.     Left Ear: External ear normal.  Eyes:     Extraocular Movements: Extraocular movements intact.     Pupils: Pupils are equal, round, and reactive to light.  Cardiovascular:     Rate and Rhythm: Normal rate and regular rhythm.     Pulses: Normal pulses.     Heart sounds: Normal heart sounds. No murmur heard.   No gallop.  Pulmonary:     Effort: Pulmonary effort is normal. No respiratory distress.     Breath sounds: Normal breath sounds. No wheezing, rhonchi or rales.  Abdominal:     General: Bowel sounds are normal. There is no distension.     Palpations: Abdomen is soft. There is no mass.     Tenderness: There is no abdominal tenderness. There is no guarding or rebound.     Hernia: No hernia is  present.  Musculoskeletal:     Cervical back: Normal range of motion and neck supple.  Lymphadenopathy:     Cervical: No cervical adenopathy.  Skin:    General: Skin is warm and dry.  Neurological:     Mental Status: She is alert and oriented to person, place, and time.  Psychiatric:        Behavior: Behavior normal.    There were no vitals taken for this visit. Wt Readings from Last 3 Encounters:  07/28/21 170 lb (77.1 kg)  05/30/21 173 lb (78.5 kg)  05/27/21 174 lb 3.2 oz (79 kg)    Diabetic Foot Exam - Simple   No data filed    Lab Results  Component Value Date   WBC 7.4 02/25/2021   HGB 12.8 02/25/2021   HCT 36.4 02/25/2021   PLT 316.0 02/25/2021   GLUCOSE 99 02/25/2021   CHOL 141 02/25/2021   TRIG 105.0 02/25/2021   HDL 67.10 02/25/2021   LDLCALC 53 02/25/2021   ALT 49 (H) 02/25/2021   AST 40 (H) 02/25/2021   NA 139 02/25/2021   K 3.9 02/25/2021   CL 103 02/25/2021   CREATININE 0.85 02/25/2021   BUN 15 02/25/2021   CO2 27 02/25/2021   TSH 1.600 11/26/2018   HGBA1C 5.5 04/16/2015    Lab Results  Component Value Date   TSH 1.600 11/26/2018   Lab Results  Component Value Date   WBC 7.4 02/25/2021   HGB 12.8 02/25/2021   HCT 36.4 02/25/2021  MCV 91.7 02/25/2021   PLT 316.0 02/25/2021   Lab Results  Component Value Date   NA 139 02/25/2021   K 3.9 02/25/2021   CO2 27 02/25/2021   GLUCOSE 99 02/25/2021   BUN 15 02/25/2021   CREATININE 0.85 02/25/2021   BILITOT 0.4 02/25/2021   ALKPHOS 66 02/25/2021   AST 40 (H) 02/25/2021   ALT 49 (H) 02/25/2021   PROT 6.7 02/25/2021   ALBUMIN 4.3 02/25/2021   CALCIUM 9.3 02/25/2021   GFR 73.78 02/25/2021   Lab Results  Component Value Date   CHOL 141 02/25/2021   Lab Results  Component Value Date   HDL 67.10 02/25/2021   Lab Results  Component Value Date   LDLCALC 53 02/25/2021   Lab Results  Component Value Date   TRIG 105.0 02/25/2021   Lab Results  Component Value Date   CHOLHDL 2  02/25/2021   Lab Results  Component Value Date   HGBA1C 5.5 04/16/2015       Mammogram: Last completed on 04/26/2021. Results showed possible distortion in the right breast and left breast appears normal. Further evaluation required. Colonoscopy: Last completed on 12/11/2008. Repeat in 10 years. Due Pap Smear: Last completed on 08/21/2016. Results were normal. Repeat in 3 years. Due Dexa:  Last completed on 04/26/2021. The patient is considered osteopenic according to the [employer]. Repeat in 2 years.  Assessment & Plan:   Problem List Items Addressed This Visit   None   No orders of the defined types were placed in this encounter.   I,[name],acting as a Neurosurgeon for [employer], DO.,have documented all relevant documentation on the behalf of Donato Schultz, DO,as directed by  Donato Schultz, DO while in the presence of Donato Schultz, DO.   I, Donato Schultz, DO., personally preformed the services described in this documentation.  All medical record entries made by the scribe were at my direction and in my presence.  I have reviewed the chart and discharge instructions (if applicable) and agree that the record reflects my personal performance and is accurate and complete. 09/02/2021

## 2021-09-02 NOTE — Assessment & Plan Note (Signed)
Well controlled, no changes to meds. Encouraged heart healthy diet such as the DASH diet and exercise as tolerated.  °

## 2021-09-02 NOTE — Assessment & Plan Note (Signed)
Tolerating statin, encouraged heart healthy diet, avoid trans fats, minimize simple carbs and saturated fats. Increase exercise as tolerated 

## 2021-09-02 NOTE — Progress Notes (Signed)
Acute Office Visit  Subjective:    Patient ID: CHI WOODHAM, female    DOB: [birth year], 60 y.o.   MRN: [id number]    HPI Patient is in today for cpe and f/u bp , cholesterol, anxiety/ depression.    No complaints   Past Medical History:  Diagnosis Date   Allergy    seasonal allergies   Anxiety    on meds   Arthritis    on meds   Depression    on meds   Dizziness 03/05/2017   Essential hypertension 03/05/2017   on meds   GERD (gastroesophageal reflux disease)    on meds   Hyperlipidemia 03/05/2017   on meds   Infertility, female    Migraines    Osteopenia     Past Surgical History:  Procedure Laterality Date   ANKLE SURGERY Left 1979   COLONOSCOPY  2011   MS-F/V-moviprep(exc)-normal -10 yr recall   SHOULDER SURGERY Left 1990    Family History  Problem Relation Age of Onset   Hypertension Mother    Allergic rhinitis Mother    Sleep apnea Mother    Hyperlipidemia Mother    Cancer Maternal Grandmother    Stroke Other    Eczema Daughter    Alcohol abuse Father    Stroke Father    Heart disease Maternal Grandfather    Alcohol abuse Paternal Grandfather    Colon polyps Neg Hx    Colon cancer Neg Hx    Esophageal cancer Neg Hx    Rectal cancer Neg Hx    Stomach cancer Neg Hx     Social History   Socioeconomic History   Marital status: Legally Separated    Spouse name: Not on file   Number of children: Not on file   Years of education: Not on file   Highest education level: Not on file  Occupational History    Employer: [employer]    Comment: [employer] [employer]  Tobacco Use   Smoking status: Former    Types: Cigarettes    Quit date: 02/09/1992    Years since quitting: 29.5   Smokeless tobacco: Never  Vaping Use   Vaping Use: Never used  Substance and Sexual Activity   Alcohol use: Yes    Alcohol/week: 12.0 standard drinks    Types: 12 Standard drinks or equivalent per week   Drug use: No   Sexual activity: Yes    Partners: Male   Other Topics Concern   Not on file  Social History Narrative   Not on file   Social Determinants of Health   Financial Resource Strain: Not on file  Food Insecurity: Not on file  Transportation Needs: Not on file  Physical Activity: Not on file  Stress: Not on file  Social Connections: Not on file  Intimate Partner Violence: Not on file    Outpatient Medications Prior to Visit  Medication Sig Dispense Refill   amLODipine (NORVASC) 5 MG tablet Take 1 tablet (5 mg total) by mouth daily. Please keep upcoming appt for future refills 30 tablet 0   Ascorbic Acid (VITAMIN C PO) Take by mouth.     aspirin EC 81 MG tablet Take 81 mg by mouth daily.     baclofen (LIORESAL) 10 MG tablet TAKE 1 TABLET BY MOUTH EVERY 8 HOURS AS NEEDED 60 tablet 3   BOTOX 100 units SOLR injection INJECT 100 UNITS INTO THE MUSCLE EVERY 3 MONTHS. 1 each 3   Calcium Carbonate (CALCIUM  600 PO) Take by mouth.     Fluticasone Propionate (FLONASE NA) Place into the nose.     Galcanezumab-gnlm (EMGALITY) 120 MG/ML SOAJ INJECT 1 PEN EVERY 30 DAYS 1 mL 11   HYDROcodone-acetaminophen (NORCO/VICODIN) 5-325 MG tablet Take 1 tablet by mouth every 8  hours as needed. 15 tablet 0   ibuprofen (ADVIL) 800 MG tablet Take 1 tablet (800 mg total) by mouth every 8 (eight) hours as needed. 60 tablet 3   Lasmiditan Succinate (REYVOW) 100 MG TABS Take 100 mg by mouth as needed (migraine rescue). 8 tablet 2   magnesium oxide (MAG-OX) 400 MG tablet Take 400 mg by mouth daily.     Multiple Vitamins-Minerals (ZINC PO) Take by mouth.     naratriptan (AMERGE) 2.5 MG tablet TAKE 1 TABLET BY MOUTH AT ONSET OF HEADACHE. IF RETURNS OR DOES NOT RESOLVE MAY REPEAT AFTER 4 HOURS. DO NOT EXCEED 2 TABLETS IN 24 HOURS 10 tablet 11   NONFORMULARY OR COMPOUNDED ITEM Gaba Calm as needed for anxiety     nystatin (MYCOSTATIN) 100000 UNIT/ML suspension Take 5 mLs by mouth four times daily. 60 mL 0   ondansetron (ZOFRAN) 8 MG tablet TAKE 1 TABLET BY MOUTH  EVERY 8 HOURS AS NEEDED 20 tablet 3   Probiotic Product (PROBIOTIC DAILY PO) Take by mouth.     Pseudoephedrine HCl (SUDAFED PO) Take 1 tablet by mouth daily as needed.     RESTASIS 0.05 % ophthalmic emulsion Place 1 drop into both eyes 2 (two) times daily.     Rimegepant Sulfate (NURTEC) 75 MG TBDP Take 75 mg by mouth every other day. 16 tablet 11   rosuvastatin (CRESTOR) 40 MG tablet TAKE 1 TABLET BY MOUTH ONCE DAILY 30 tablet 2   tiZANidine (ZANAFLEX) 4 MG tablet TAKE 1 TABLET BY MOUTH 3 TIMES DAILY 90 tablet 2   topiramate (TOPAMAX) 200 MG tablet TAKE 1 TABLET BY MOUTH ONCE DAILY 30 tablet 5   UNABLE TO FIND CBD 50 mg     vitamin B-12 (CYANOCOBALAMIN) 500 MCG tablet Take 500 mcg by mouth daily.     VITAMIN D PO Take by mouth.     celecoxib (CELEBREX) 100 MG capsule Take 1 capsule (100 mg total) by mouth 2 (two) times daily. 60 capsule 3   fluticasone (FLONASE) 50 MCG/ACT nasal spray Place 2 sprays into both nostrils daily. 16 g 1   hydrOXYzine (VISTARIL) 50 MG capsule TAKE 2 CAPSULES BY MOUTH 3 TIMES DAILY AS NEEDED FOR ITCHING 180 capsule 0   pantoprazole (PROTONIX) 40 MG tablet TAKE 1 TABLET BY MOUTH ONCE DAILY 90 tablet 3   sertraline (ZOLOFT) 100 MG tablet Take 1 and 1/2 tablets by mouth daily. 45 tablet 1   traZODone (DESYREL) 50 MG tablet TAKE 1/2 TO 1 TABLET BY MOUTH AT BEDTIME AS NEEDED FOR SLEEP 30 tablet 0   cefdinir (OMNICEF) 300 MG capsule Take 1 capsule (300 mg total) by mouth 2 (two) times daily. (Patient not taking: Reported on 09/02/2021) 20 capsule 0   fluconazole (DIFLUCAN) 150 MG tablet TAKE 1 TABLET BY MOUTH ON DAY 1 AND REPEAT IN 3 DAYS (Patient not taking: Reported on 09/02/2021) 2 tablet 0   No facility-administered medications prior to visit.    Allergies  Allergen Reactions   Augmentin [Amoxicillin-Pot Clavulanate]     diarrhea    Review of Systems  Constitutional:  Negative for activity change, appetite change, chills, fatigue, fever and unexpected weight  change.  HENT:  Negative for congestion and hearing loss.   Eyes:  Negative for discharge.  Respiratory:  Negative for cough and shortness of breath.   Cardiovascular:  Negative for chest pain, palpitations and leg swelling.  Gastrointestinal:  Negative for abdominal pain, blood in stool, constipation, diarrhea, nausea and vomiting.  Genitourinary:  Negative for dysuria, frequency, hematuria and urgency.  Musculoskeletal:  Negative for back pain and myalgias.  Skin:  Negative for rash.  Allergic/Immunologic: Negative for environmental allergies.  Neurological:  Negative for dizziness, weakness and headaches.  Hematological:  Does not bruise/bleed easily.  Psychiatric/Behavioral:  Negative for behavioral problems, dysphoric mood and suicidal ideas. The patient is not nervous/anxious.       Objective:    Physical Exam Vitals and nursing note reviewed.  Constitutional:      General: She is not in acute distress.    Appearance: She is well-developed. She is not diaphoretic.  HENT:     Head: Normocephalic and atraumatic.     Right Ear: External ear normal.     Left Ear: External ear normal.     Nose: Nose normal.  Eyes:     General:        Right eye: No discharge.        Left eye: No discharge.     Conjunctiva/sclera: Conjunctivae normal.     Pupils: Pupils are equal, round, and reactive to light.  Neck:     Thyroid: No thyromegaly.     Vascular: No carotid bruit or JVD.  Cardiovascular:     Rate and Rhythm: Normal rate and regular rhythm.     Heart sounds: Normal heart sounds. No murmur heard. Pulmonary:     Effort: Pulmonary effort is normal. No respiratory distress.     Breath sounds: Normal breath sounds. No wheezing or rales.  Chest:     Chest wall: No tenderness.  Abdominal:     General: Bowel sounds are normal. There is no distension.     Palpations: Abdomen is soft. There is no mass.     Tenderness: There is no abdominal tenderness. There is no guarding or rebound.   Genitourinary:    General: Normal vulva.     Vagina: Normal. No vaginal discharge.     Rectum: Normal. Guaiac result negative.     Comments: Pap done Musculoskeletal:        General: No tenderness. Normal range of motion.     Cervical back: Normal range of motion and neck supple.  Lymphadenopathy:     Cervical: No cervical adenopathy.  Skin:    General: Skin is warm and dry.     Findings: No erythema or rash.  Neurological:     Mental Status: She is alert and oriented to person, place, and time.     Cranial Nerves: No cranial nerve deficit.     Deep Tendon Reflexes: Reflexes are normal and symmetric.  Psychiatric:        Mood and Affect: Mood normal.        Behavior: Behavior normal.        Thought Content: Thought content normal.        Judgment: Judgment normal.    BP 110/70 (BP Location: Right Arm, Patient Position: Sitting, Cuff Size: Normal)   Pulse (!) 55   Temp 97.6 F (36.4 C) (Oral)   Resp 18   Ht 5\' 4"  (1.626 m)   Wt 168 lb (76.2 kg)   SpO2 99%   BMI 28.84 kg/m  Wt Readings  from Last 3 Encounters:  09/02/21 168 lb (76.2 kg)  07/28/21 170 lb (77.1 kg)  05/30/21 173 lb (78.5 kg)    Health Maintenance Due  Topic Date Due   Zoster Vaccines- Shingrix (1 of 2) Never done   COLONOSCOPY (Pts 45-31yrs Insurance coverage will need to be confirmed)  12/11/2018   PAP SMEAR-Modifier  08/22/2019   COVID-19 Vaccine (4 - Booster for Pfizer series) 01/18/2021   INFLUENZA VACCINE  07/11/2021    There are no preventive care reminders to display for this patient.   Lab Results  Component Value Date   TSH 1.600 11/26/2018   Lab Results  Component Value Date   WBC 7.4 02/25/2021   HGB 12.8 02/25/2021   HCT 36.4 02/25/2021   MCV 91.7 02/25/2021   PLT 316.0 02/25/2021   Lab Results  Component Value Date   NA 139 02/25/2021   K 3.9 02/25/2021   CO2 27 02/25/2021   GLUCOSE 99 02/25/2021   BUN 15 02/25/2021   CREATININE 0.85 02/25/2021   BILITOT 0.4 02/25/2021    ALKPHOS 66 02/25/2021   AST 40 (H) 02/25/2021   ALT 49 (H) 02/25/2021   PROT 6.7 02/25/2021   ALBUMIN 4.3 02/25/2021   CALCIUM 9.3 02/25/2021   GFR 73.78 02/25/2021   Lab Results  Component Value Date   CHOL 141 02/25/2021   Lab Results  Component Value Date   HDL 67.10 02/25/2021   Lab Results  Component Value Date   LDLCALC 53 02/25/2021   Lab Results  Component Value Date   TRIG 105.0 02/25/2021   Lab Results  Component Value Date   CHOLHDL 2 02/25/2021   Lab Results  Component Value Date   HGBA1C 5.5 04/16/2015       Assessment & Plan:   Problem List Items Addressed This Visit       Unprioritized   Depression with anxiety   Relevant Medications   hydrOXYzine (VISTARIL) 50 MG capsule   sertraline (ZOLOFT) 100 MG tablet   traZODone (DESYREL) 50 MG tablet   Insomnia   Relevant Medications   traZODone (DESYREL) 50 MG tablet   Depression, major, single episode, severe (HCC)   Relevant Medications   hydrOXYzine (VISTARIL) 50 MG capsule   sertraline (ZOLOFT) 100 MG tablet   traZODone (DESYREL) 50 MG tablet   Essential hypertension    Well controlled, no changes to meds. Encouraged heart healthy diet such as the DASH diet and exercise as tolerated.       Relevant Orders   CBC with Differential/Platelet   Comprehensive metabolic panel   TSH   Lipid panel   Hyperlipidemia    Tolerating statin, encouraged heart healthy diet, avoid trans fats, minimize simple carbs and saturated fats. Increase exercise as tolerated      Relevant Orders   CBC with Differential/Platelet   Comprehensive metabolic panel   TSH   Lipid panel   Preventative health care - Primary    ghm utd Check labs  See avs Pt does not want flu shot or covid vaccine       Relevant Orders   Cytology - PAP( [facility])   CBC with Differential/Platelet   Comprehensive metabolic panel   TSH   Lipid panel   Other Visit Diagnoses     Gastroesophageal reflux disease        Relevant Medications   pantoprazole (PROTONIX) 40 MG tablet   Anxiety       Relevant Medications   hydrOXYzine (VISTARIL)  50 MG capsule   sertraline (ZOLOFT) 100 MG tablet   traZODone (DESYREL) 50 MG tablet        Meds ordered this encounter  Medications   celecoxib (CELEBREX) 100 MG capsule    Sig: Take 1 capsule (100 mg total) by mouth 2 (two) times daily.    Dispense:  60 capsule    Refill:  3   fluticasone (FLONASE) 50 MCG/ACT nasal spray    Sig: Place 2 sprays into both nostrils daily.    Dispense:  16 g    Refill:  1   hydrOXYzine (VISTARIL) 50 MG capsule    Sig: TAKE 2 CAPSULES BY MOUTH 3 TIMES DAILY AS NEEDED FOR ITCHING    Dispense:  180 capsule    Refill:  0   pantoprazole (PROTONIX) 40 MG tablet    Sig: TAKE 1 TABLET BY MOUTH ONCE DAILY    Dispense:  90 tablet    Refill:  3   sertraline (ZOLOFT) 100 MG tablet    Sig: Take 1 and 1/2 tablets by mouth daily.    Dispense:  45 tablet    Refill:  1   traZODone (DESYREL) 50 MG tablet    Sig: TAKE 1/2 TO 1 TABLET BY MOUTH AT BEDTIME AS NEEDED FOR SLEEP    Dispense:  30 tablet    Refill:  0     Donato Schultz, DO

## 2021-09-02 NOTE — Progress Notes (Signed)
Subjective:   By signing my name below, I, [name], attest that this documentation has been prepared under the direction and in the presence of Donato Schultz, DO. 09/02/2021   Patient ID: [name], female    DOB: [birth year], 60 y.o.   MRN: [id number]  Chief Complaint  Patient presents with   Annual Exam    Pt states fasting.      HPI Patient is in today for a comprehensive physical exam.  Patient reports she is having trouble sleeping. She mentions that sometimes she uses trazodone and gabapentin but they are only effective for 3 hours and then she cannot sleep for the rest of the night. She mentions that vistaril used to help her sleep but she has run out and is requesting a refill.  She has an area on her left arm that she is worried about and is willing to set an appointment with her dermatologist.  She is interested in getting the shingles vaccine and Covid 19 booster but wants to wait until she has a long stretch of time with no obligations. She is getting the flu vaccine at work.  She denies fever, hearing loss, ear pain,congestion, sinus pain, sore throat, eye pain, chest pain, palpitations, cough, shortness of breath, wheezing, nausea. vomiting, diarrhea, constipation, blood in stool, dysuria,frequency, hematuria and headaches.   Past Medical History:  Diagnosis Date   Allergy    seasonal allergies   Anxiety    on meds   Arthritis    on meds   Depression    on meds   Dizziness 03/05/2017   Essential hypertension 03/05/2017   on meds   GERD (gastroesophageal reflux disease)    on meds   Hyperlipidemia 03/05/2017   on meds   Infertility, female    Migraines    Osteopenia     Past Surgical History:  Procedure Laterality Date   ANKLE SURGERY Left 1979   COLONOSCOPY  2011   MS-F/V-moviprep(exc)-normal -10 yr recall   SHOULDER SURGERY Left 1990    Family History  Problem Relation Age of Onset   Hypertension Mother    Allergic rhinitis Mother     Sleep apnea Mother    Hyperlipidemia Mother    Cancer Maternal Grandmother    Stroke Other    Eczema Daughter    Alcohol abuse Father    Stroke Father    Heart disease Maternal Grandfather    Alcohol abuse Paternal Grandfather    Colon polyps Neg Hx    Colon cancer Neg Hx    Esophageal cancer Neg Hx    Rectal cancer Neg Hx    Stomach cancer Neg Hx     Social History   Socioeconomic History   Marital status: Legally Separated    Spouse name: Not on file   Number of children: Not on file   Years of education: Not on file   Highest education level: Not on file  Occupational History    Employer: [employer]    Comment: [employer]  Tobacco Use   Smoking status: Former    Types: Cigarettes    Quit date: 02/09/1992    Years since quitting: 29.5   Smokeless tobacco: Never  Vaping Use   Vaping Use: Never used  Substance and Sexual Activity   Alcohol use: Yes    Alcohol/week: 12.0 standard drinks    Types: 12 Standard drinks or equivalent per week   Drug use: No   Sexual activity:  Yes    Partners: Male  Other Topics Concern   Not on file  Social History Narrative   Not on file   Social Determinants of Health   Financial Resource Strain: Not on file  Food Insecurity: Not on file  Transportation Needs: Not on file  Physical Activity: Not on file  Stress: Not on file  Social Connections: Not on file  Intimate Partner Violence: Not on file    Outpatient Medications Prior to Visit  Medication Sig Dispense Refill   amLODipine (NORVASC) 5 MG tablet Take 1 tablet (5 mg total) by mouth daily. Please keep upcoming appt for future refills 30 tablet 0   Ascorbic Acid (VITAMIN C PO) Take by mouth.     aspirin EC 81 MG tablet Take 81 mg by mouth daily.     baclofen (LIORESAL) 10 MG tablet TAKE 1 TABLET BY MOUTH EVERY 8 HOURS AS NEEDED 60 tablet 3   BOTOX 100 units SOLR injection INJECT 100 UNITS INTO THE MUSCLE EVERY 3 MONTHS. 1 each 3   Calcium Carbonate  (CALCIUM 600 PO) Take by mouth.     Fluticasone Propionate (FLONASE NA) Place into the nose.     Galcanezumab-gnlm (EMGALITY) 120 MG/ML SOAJ INJECT 1 PEN EVERY 30 DAYS 1 mL 11   HYDROcodone-acetaminophen (NORCO/VICODIN) 5-325 MG tablet Take 1 tablet by mouth every 8  hours as needed. 15 tablet 0   ibuprofen (ADVIL) 800 MG tablet Take 1 tablet (800 mg total) by mouth every 8 (eight) hours as needed. 60 tablet 3   Lasmiditan Succinate (REYVOW) 100 MG TABS Take 100 mg by mouth as needed (migraine rescue). 8 tablet 2   magnesium oxide (MAG-OX) 400 MG tablet Take 400 mg by mouth daily.     Multiple Vitamins-Minerals (ZINC PO) Take by mouth.     naratriptan (AMERGE) 2.5 MG tablet TAKE 1 TABLET BY MOUTH AT ONSET OF HEADACHE. IF RETURNS OR DOES NOT RESOLVE MAY REPEAT AFTER 4 HOURS. DO NOT EXCEED 2 TABLETS IN 24 HOURS 10 tablet 11   NONFORMULARY OR COMPOUNDED ITEM Gaba Calm as needed for anxiety     nystatin (MYCOSTATIN) 100000 UNIT/ML suspension Take 5 mLs by mouth four times daily. 60 mL 0   ondansetron (ZOFRAN) 8 MG tablet TAKE 1 TABLET BY MOUTH EVERY 8 HOURS AS NEEDED 20 tablet 3   Probiotic Product (PROBIOTIC DAILY PO) Take by mouth.     Pseudoephedrine HCl (SUDAFED PO) Take 1 tablet by mouth daily as needed.     RESTASIS 0.05 % ophthalmic emulsion Place 1 drop into both eyes 2 (two) times daily.     Rimegepant Sulfate (NURTEC) 75 MG TBDP Take 75 mg by mouth every other day. 16 tablet 11   rosuvastatin (CRESTOR) 40 MG tablet TAKE 1 TABLET BY MOUTH ONCE DAILY 30 tablet 2   tiZANidine (ZANAFLEX) 4 MG tablet TAKE 1 TABLET BY MOUTH 3 TIMES DAILY 90 tablet 2   topiramate (TOPAMAX) 200 MG tablet TAKE 1 TABLET BY MOUTH ONCE DAILY 30 tablet 5   UNABLE TO FIND CBD 50 mg     vitamin B-12 (CYANOCOBALAMIN) 500 MCG tablet Take 500 mcg by mouth daily.     VITAMIN D PO Take by mouth.     celecoxib (CELEBREX) 100 MG capsule Take 1 capsule (100 mg total) by mouth 2 (two) times daily. 60 capsule 3   fluticasone  (FLONASE) 50 MCG/ACT nasal spray Place 2 sprays into both nostrils daily. 16 g 1   hydrOXYzine (  VISTARIL) 50 MG capsule TAKE 2 CAPSULES BY MOUTH 3 TIMES DAILY AS NEEDED FOR ITCHING 180 capsule 0   pantoprazole (PROTONIX) 40 MG tablet TAKE 1 TABLET BY MOUTH ONCE DAILY 90 tablet 3   sertraline (ZOLOFT) 100 MG tablet Take 1 and 1/2 tablets by mouth daily. 45 tablet 1   traZODone (DESYREL) 50 MG tablet TAKE 1/2 TO 1 TABLET BY MOUTH AT BEDTIME AS NEEDED FOR SLEEP 30 tablet 0   cefdinir (OMNICEF) 300 MG capsule Take 1 capsule (300 mg total) by mouth 2 (two) times daily. (Patient not taking: Reported on 09/02/2021) 20 capsule 0   fluconazole (DIFLUCAN) 150 MG tablet TAKE 1 TABLET BY MOUTH ON DAY 1 AND REPEAT IN 3 DAYS (Patient not taking: Reported on 09/02/2021) 2 tablet 0   No facility-administered medications prior to visit.    Allergies  Allergen Reactions   Augmentin [Amoxicillin-Pot Clavulanate]     diarrhea    Review of Systems  Constitutional:  Negative for fever.  HENT:  Negative for congestion, ear pain, hearing loss, sinus pain and sore throat.   Eyes:  Negative for blurred vision and pain.  Respiratory:  Negative for cough, sputum production, shortness of breath and wheezing.   Cardiovascular:  Negative for chest pain and palpitations.  Gastrointestinal:  Negative for blood in stool, constipation, diarrhea, nausea and vomiting.  Genitourinary:  Negative for dysuria, frequency, hematuria and urgency.  Musculoskeletal:  Negative for back pain, falls and myalgias.  Neurological:  Negative for dizziness, sensory change, loss of consciousness, weakness and headaches.  Endo/Heme/Allergies:  Negative for environmental allergies. Does not bruise/bleed easily.  Psychiatric/Behavioral:  Negative for depression and suicidal ideas. The patient has insomnia. The patient is not nervous/anxious.       Objective:    Physical Exam Constitutional:      General: She is not in acute distress.     Appearance: Normal appearance. She is not ill-appearing.  HENT:     Head: Normocephalic and atraumatic.     Right Ear: Tympanic membrane, ear canal and external ear normal.     Left Ear: Tympanic membrane, ear canal and external ear normal.  Eyes:     Extraocular Movements: Extraocular movements intact.     Pupils: Pupils are equal, round, and reactive to light.  Cardiovascular:     Rate and Rhythm: Normal rate and regular rhythm.     Pulses: Normal pulses.     Heart sounds: Normal heart sounds. No murmur heard.   No gallop.  Pulmonary:     Effort: Pulmonary effort is normal. No respiratory distress.     Breath sounds: Normal breath sounds. No wheezing, rhonchi or rales.  Abdominal:     General: Bowel sounds are normal. There is no distension.     Palpations: Abdomen is soft. There is no mass.     Tenderness: There is no abdominal tenderness. There is no guarding or rebound.     Hernia: No hernia is present.  Musculoskeletal:     Cervical back: Normal range of motion and neck supple.  Lymphadenopathy:     Cervical: No cervical adenopathy.  Skin:    General: Skin is warm and dry.  Neurological:     Mental Status: She is alert and oriented to person, place, and time.  Psychiatric:        Behavior: Behavior normal.    BP 110/70 (BP Location: Right Arm, Patient Position: Sitting, Cuff Size: Normal)   Pulse (!) 55   Temp 97.6  F (36.4 C) (Oral)   Resp 18   Ht 5\' 4"  (1.626 m)   Wt 168 lb (76.2 kg)   SpO2 99%   BMI 28.84 kg/m  Wt Readings from Last 3 Encounters:  09/02/21 168 lb (76.2 kg)  07/28/21 170 lb (77.1 kg)  05/30/21 173 lb (78.5 kg)    Diabetic Foot Exam - Simple   No data filed    Lab Results  Component Value Date   WBC 7.4 02/25/2021   HGB 12.8 02/25/2021   HCT 36.4 02/25/2021   PLT 316.0 02/25/2021   GLUCOSE 99 02/25/2021   CHOL 141 02/25/2021   TRIG 105.0 02/25/2021   HDL 67.10 02/25/2021   LDLCALC 53 02/25/2021   ALT 49 (H) 02/25/2021   AST 40  (H) 02/25/2021   NA 139 02/25/2021   K 3.9 02/25/2021   CL 103 02/25/2021   CREATININE 0.85 02/25/2021   BUN 15 02/25/2021   CO2 27 02/25/2021   TSH 1.600 11/26/2018   HGBA1C 5.5 04/16/2015    Lab Results  Component Value Date   TSH 1.600 11/26/2018   Lab Results  Component Value Date   WBC 7.4 02/25/2021   HGB 12.8 02/25/2021   HCT 36.4 02/25/2021   MCV 91.7 02/25/2021   PLT 316.0 02/25/2021   Lab Results  Component Value Date   NA 139 02/25/2021   K 3.9 02/25/2021   CO2 27 02/25/2021   GLUCOSE 99 02/25/2021   BUN 15 02/25/2021   CREATININE 0.85 02/25/2021   BILITOT 0.4 02/25/2021   ALKPHOS 66 02/25/2021   AST 40 (H) 02/25/2021   ALT 49 (H) 02/25/2021   PROT 6.7 02/25/2021   ALBUMIN 4.3 02/25/2021   CALCIUM 9.3 02/25/2021   GFR 73.78 02/25/2021   Lab Results  Component Value Date   CHOL 141 02/25/2021   Lab Results  Component Value Date   HDL 67.10 02/25/2021   Lab Results  Component Value Date   LDLCALC 53 02/25/2021   Lab Results  Component Value Date   TRIG 105.0 02/25/2021   Lab Results  Component Value Date   CHOLHDL 2 02/25/2021   Lab Results  Component Value Date   HGBA1C 5.5 04/16/2015       Mammogram: Last completed on 04/26/2021. Results showed possible distortion in the right breast and left breast appears normal. Further evaluation required. Colonoscopy: Last completed on 12/11/2008. Repeat in 10 years. Due Pap Smear: Last completed on 08/21/2016. Results were normal. Repeat in 3 years. Due Dexa:  Last completed on 04/26/2021. The patient is considered osteopenic according to the 04/28/2021. Repeat in 2 years.  Assessment & Plan:   Problem List Items Addressed This Visit       Cardiovascular and Mediastinum   Essential hypertension    Well controlled, no changes to meds. Encouraged heart healthy diet such as the DASH diet and exercise as tolerated.       Relevant Orders   CBC with Differential/Platelet    Comprehensive metabolic panel   TSH   Lipid panel     Other   Insomnia   Relevant Medications   traZODone (DESYREL) 50 MG tablet   Depression with anxiety   Relevant Medications   hydrOXYzine (VISTARIL) 50 MG capsule   sertraline (ZOLOFT) 100 MG tablet   traZODone (DESYREL) 50 MG tablet   Hyperlipidemia    Tolerating statin, encouraged heart healthy diet, avoid trans fats, minimize simple carbs and saturated fats. Increase exercise as tolerated  Relevant Orders   CBC with Differential/Platelet   Comprehensive metabolic panel   TSH   Lipid panel   Other Visit Diagnoses     Preventative health care    -  Primary   Relevant Orders   Cytology - PAP( [facility])   CBC with Differential/Platelet   Comprehensive metabolic panel   TSH   Lipid panel   Gastroesophageal reflux disease       Relevant Medications   pantoprazole (PROTONIX) 40 MG tablet   Anxiety       Relevant Medications   hydrOXYzine (VISTARIL) 50 MG capsule   sertraline (ZOLOFT) 100 MG tablet   traZODone (DESYREL) 50 MG tablet        Meds ordered this encounter  Medications   celecoxib (CELEBREX) 100 MG capsule    Sig: Take 1 capsule (100 mg total) by mouth 2 (two) times daily.    Dispense:  60 capsule    Refill:  3   fluticasone (FLONASE) 50 MCG/ACT nasal spray    Sig: Place 2 sprays into both nostrils daily.    Dispense:  16 g    Refill:  1   hydrOXYzine (VISTARIL) 50 MG capsule    Sig: TAKE 2 CAPSULES BY MOUTH 3 TIMES DAILY AS NEEDED FOR ITCHING    Dispense:  180 capsule    Refill:  0   pantoprazole (PROTONIX) 40 MG tablet    Sig: TAKE 1 TABLET BY MOUTH ONCE DAILY    Dispense:  90 tablet    Refill:  3   sertraline (ZOLOFT) 100 MG tablet    Sig: Take 1 and 1/2 tablets by mouth daily.    Dispense:  45 tablet    Refill:  1   traZODone (DESYREL) 50 MG tablet    Sig: TAKE 1/2 TO 1 TABLET BY MOUTH AT BEDTIME AS NEEDED FOR SLEEP    Dispense:  30 tablet    Refill:  0     I,Zite  Okoli,acting as a scribe for [employer], DO.,have documented all relevant documentation on the behalf of Donato Schultz, DO,as directed by  Donato Schultz, DO while in the presence of Donato Schultz, DO.   I, Donato Schultz, DO., personally preformed the services described in this documentation.  All medical record entries made by the scribe were at my direction and in my presence.  I have reviewed the chart and discharge instructions (if applicable) and agree that the record reflects my personal performance and is accurate and complete. 09/02/2021

## 2021-09-05 LAB — CYTOLOGY - PAP: Diagnosis: NEGATIVE

## 2021-09-07 ENCOUNTER — Encounter: Payer: Self-pay | Admitting: *Deleted

## 2021-09-07 ENCOUNTER — Telehealth: Payer: Self-pay | Admitting: *Deleted

## 2021-09-07 ENCOUNTER — Other Ambulatory Visit (HOSPITAL_COMMUNITY): Payer: Self-pay

## 2021-09-07 MED ORDER — CEQUA 0.09 % OP SOLN
OPHTHALMIC | 0 refills | Status: DC
  Filled 2021-09-07: qty 180, 90d supply, fill #0

## 2021-09-07 MED ORDER — TYRVAYA 0.03 MG/ACT NA SOLN
NASAL | 11 refills | Status: DC
  Filled 2021-09-07: qty 8.4, 42d supply, fill #0
  Filled 2022-09-04: qty 8.4, 30d supply, fill #0

## 2021-09-07 MED ORDER — CEQUA 0.09 % OP SOLN: OPHTHALMIC | 11 refills | Status: DC

## 2021-09-07 NOTE — Telephone Encounter (Signed)
PA sent for [employer] to Cover My Meds

## 2021-09-08 ENCOUNTER — Other Ambulatory Visit: Payer: Self-pay | Admitting: Family Medicine

## 2021-09-08 ENCOUNTER — Other Ambulatory Visit (HOSPITAL_COMMUNITY): Payer: Self-pay

## 2021-09-08 DIAGNOSIS — E785 Hyperlipidemia, unspecified: Secondary | ICD-10-CM

## 2021-09-09 ENCOUNTER — Other Ambulatory Visit (HOSPITAL_COMMUNITY): Payer: Self-pay

## 2021-09-12 ENCOUNTER — Other Ambulatory Visit (HOSPITAL_BASED_OUTPATIENT_CLINIC_OR_DEPARTMENT_OTHER): Payer: Self-pay

## 2021-09-12 ENCOUNTER — Other Ambulatory Visit (HOSPITAL_COMMUNITY): Payer: Self-pay

## 2021-09-12 MED ORDER — BUTALBITAL-APAP-CAFFEINE 50-300-40 MG PO CAPS
ORAL_CAPSULE | ORAL | 0 refills | Status: DC
  Filled 2021-09-12: qty 30, 5d supply, fill #0

## 2021-09-13 ENCOUNTER — Other Ambulatory Visit: Payer: Self-pay

## 2021-09-13 ENCOUNTER — Other Ambulatory Visit (HOSPITAL_COMMUNITY): Payer: Self-pay

## 2021-09-13 MED ORDER — EZETIMIBE 10 MG PO TABS
10.0000 mg | ORAL_TABLET | Freq: Every day | ORAL | 2 refills | Status: DC
  Filled 2021-09-13 – 2021-10-07 (×2): qty 30, 30d supply, fill #0
  Filled 2021-11-01: qty 30, 30d supply, fill #1
  Filled 2021-12-08: qty 30, 30d supply, fill #2

## 2021-09-20 ENCOUNTER — Encounter: Payer: Self-pay | Admitting: *Deleted

## 2021-09-22 ENCOUNTER — Other Ambulatory Visit (HOSPITAL_COMMUNITY): Payer: Self-pay

## 2021-09-23 ENCOUNTER — Ambulatory Visit (HOSPITAL_BASED_OUTPATIENT_CLINIC_OR_DEPARTMENT_OTHER): Payer: 59 | Admitting: Cardiovascular Disease

## 2021-09-30 ENCOUNTER — Other Ambulatory Visit (HOSPITAL_COMMUNITY): Payer: Self-pay

## 2021-09-30 ENCOUNTER — Other Ambulatory Visit: Payer: Self-pay

## 2021-09-30 ENCOUNTER — Ambulatory Visit (INDEPENDENT_AMBULATORY_CARE_PROVIDER_SITE_OTHER): Payer: 59 | Admitting: Physician Assistant

## 2021-09-30 ENCOUNTER — Encounter: Payer: Self-pay | Admitting: Physician Assistant

## 2021-09-30 VITALS — BP 125/83 | HR 64

## 2021-09-30 DIAGNOSIS — G43809 Other migraine, not intractable, without status migrainosus: Secondary | ICD-10-CM | POA: Diagnosis not present

## 2021-09-30 DIAGNOSIS — G43709 Chronic migraine without aura, not intractable, without status migrainosus: Secondary | ICD-10-CM

## 2021-09-30 DIAGNOSIS — M62838 Other muscle spasm: Secondary | ICD-10-CM

## 2021-09-30 DIAGNOSIS — G43009 Migraine without aura, not intractable, without status migrainosus: Secondary | ICD-10-CM

## 2021-09-30 DIAGNOSIS — G43011 Migraine without aura, intractable, with status migrainosus: Secondary | ICD-10-CM | POA: Diagnosis not present

## 2021-09-30 MED ORDER — NURTEC 75 MG PO TBDP: 75.0000 mg | ORAL_TABLET | ORAL | 11 refills | Status: DC

## 2021-09-30 MED ORDER — ONDANSETRON HCL 8 MG PO TABS
ORAL_TABLET | Freq: Three times a day (TID) | ORAL | 3 refills | Status: DC | PRN
  Filled 2021-09-30: qty 20, 6d supply, fill #0
  Filled 2021-11-22: qty 20, 6d supply, fill #1
  Filled 2021-12-08: qty 20, 6d supply, fill #2
  Filled 2022-01-19: qty 20, 6d supply, fill #3

## 2021-09-30 MED ORDER — TOPIRAMATE 200 MG PO TABS
ORAL_TABLET | Freq: Every day | ORAL | 6 refills | Status: DC
  Filled 2021-09-30: qty 30, 30d supply, fill #0
  Filled 2021-11-01: qty 30, 30d supply, fill #1
  Filled 2021-12-08: qty 30, 30d supply, fill #2
  Filled 2022-01-19: qty 30, 30d supply, fill #3
  Filled 2022-02-24: qty 30, 30d supply, fill #4

## 2021-09-30 MED ORDER — BUTALBITAL-APAP-CAFFEINE 50-300-40 MG PO CAPS
ORAL_CAPSULE | ORAL | 0 refills | Status: DC
  Filled 2021-09-30: qty 30, 5d supply, fill #0

## 2021-09-30 MED ORDER — BOTOX 100 UNITS IJ SOLR
INTRAMUSCULAR | 3 refills | Status: DC
  Filled 2021-09-30: qty 1, 90d supply, fill #0
  Filled 2021-12-08 – 2022-03-07 (×3): qty 1, 90d supply, fill #1
  Filled 2022-06-12 – 2022-06-14 (×2): qty 1, 90d supply, fill #2
  Filled 2022-08-14 – 2022-09-13 (×2): qty 1, 90d supply, fill #3

## 2021-09-30 MED ORDER — NARATRIPTAN HCL 2.5 MG PO TABS
ORAL_TABLET | ORAL | 11 refills | Status: DC
  Filled 2021-09-30: qty 10, 30d supply, fill #0
  Filled 2022-02-24: qty 10, 30d supply, fill #1
  Filled 2022-09-04: qty 10, 30d supply, fill #2
  Filled 2022-09-28: qty 10, 30d supply, fill #3

## 2021-09-30 MED ORDER — BACLOFEN 10 MG PO TABS
ORAL_TABLET | Freq: Three times a day (TID) | ORAL | 3 refills | Status: DC | PRN
  Filled 2021-09-30: qty 60, 20d supply, fill #0
  Filled 2021-12-08: qty 60, 20d supply, fill #1
  Filled 2022-01-19: qty 60, 20d supply, fill #2

## 2021-09-30 NOTE — Progress Notes (Signed)
Pt states she had a 4 wk period where she was having constant migraines , but that has since got better.   History:  [name] is a 60 y.o. G1P1001 who presents to clinic today for headache.  She has not been good with her migraines.  She had 10 severe days, 10 moderate days and 8 days with no headache in the last month.  She used fioricet after many days with pain.  The fioricet was very helpful.   Another day she had injectable cocktail of decadron, toradol and reglan 10/60/10.This was helpful.  Between this and the fioricet she feels like she has gone back to baseline.   Nurtec caused her to feel weak and her blood pressure to bottom out.  She required fluids and food to improve but the migraine continued.  Nurtec has started causing her to feel tired and fuzzy.  She does not wish to continue using this medication.   Although she requested her botox on time, they were not able to get it for her in time.  She will have to come for the procedure at a later date. She is working one day per week.  The other times she is active, traveling and enjoying life.    HIT6: Number of days in the last 4 weeks with:  Severe headache: 10  Moderate headache: 10 Mild headache: 0  No headache: 8   Past Medical History:  Diagnosis Date   Allergy    seasonal allergies   Anxiety    on meds   Arthritis    on meds   Depression    on meds   Dizziness 03/05/2017   Essential hypertension 03/05/2017   on meds   GERD (gastroesophageal reflux disease)    on meds   Hyperlipidemia 03/05/2017   on meds   Infertility, female    Migraines    Osteopenia     Social History   Socioeconomic History   Marital status: Legally Separated    Spouse name: Not on file   Number of children: Not on file   Years of education: Not on file   Highest education level: Not on file  Occupational History    Employer: [employer]    Comment: [employer]  Tobacco Use   Smoking status: Former    Types:  Cigarettes    Quit date: 02/09/1992    Years since quitting: 29.6   Smokeless tobacco: Never  Vaping Use   Vaping Use: Never used  Substance and Sexual Activity   Alcohol use: Yes    Alcohol/week: 12.0 standard drinks    Types: 12 Standard drinks or equivalent per week   Drug use: No   Sexual activity: Yes    Partners: Male  Other Topics Concern   Not on file  Social History Narrative   Not on file   Social Determinants of Health   Financial Resource Strain: Not on file  Food Insecurity: Not on file  Transportation Needs: Not on file  Physical Activity: Not on file  Stress: Not on file  Social Connections: Not on file  Intimate Partner Violence: Not on file    Family History  Problem Relation Age of Onset   Hypertension Mother    Allergic rhinitis Mother    Sleep apnea Mother    Hyperlipidemia Mother    Cancer Maternal Grandmother    Stroke Other    Eczema Daughter    Alcohol abuse Father    Stroke Father  Heart disease Maternal Grandfather    Alcohol abuse Paternal Grandfather    Colon polyps Neg Hx    Colon cancer Neg Hx    Esophageal cancer Neg Hx    Rectal cancer Neg Hx    Stomach cancer Neg Hx     Allergies  Allergen Reactions   Augmentin [Amoxicillin-Pot Clavulanate]     diarrhea    Current Outpatient Medications on File Prior to Visit  Medication Sig Dispense Refill   amLODipine (NORVASC) 5 MG tablet Take 1 tablet (5 mg total) by mouth daily. Please keep upcoming appt for future refills 30 tablet 0   Ascorbic Acid (VITAMIN C PO) Take by mouth.     aspirin EC 81 MG tablet Take 81 mg by mouth daily.     baclofen (LIORESAL) 10 MG tablet TAKE 1 TABLET BY MOUTH EVERY 8 HOURS AS NEEDED 60 tablet 3   BOTOX 100 units SOLR injection INJECT 100 UNITS INTO THE MUSCLE EVERY 3 MONTHS. 1 each 3   Butalbital-APAP-Caffeine (FIORICET) 50-300-40 MG CAPS Take 1 - 2 capsules by mouth every 8 hours 30 capsule 0   Calcium Carbonate (CALCIUM 600 PO) Take by mouth.      celecoxib (CELEBREX) 100 MG capsule Take 1 capsule (100 mg total) by mouth 2 (two) times daily. 60 capsule 3   CEQUA 0.09 % SOLN Place 1 drop into affected eyes twice daily, approximately 12 hours apart 60 each 11   cycloSPORINE, PF, (CEQUA) 0.09 % SOLN Place 1 drop into affected eyes 2 times a day approximately 12 hours apart 180 each 0   ezetimibe (ZETIA) 10 MG tablet Take 1 tablet by mouth daily. 30 tablet 2   fluticasone (FLONASE) 50 MCG/ACT nasal spray Place 2 sprays into both nostrils daily. 16 g 1   Fluticasone Propionate (FLONASE NA) Place into the nose.     Galcanezumab-gnlm (EMGALITY) 120 MG/ML SOAJ INJECT 1 PEN EVERY 30 DAYS 1 mL 11   HYDROcodone-acetaminophen (NORCO/VICODIN) 5-325 MG tablet Take 1 tablet by mouth every 8  hours as needed. 15 tablet 0   hydrOXYzine (VISTARIL) 50 MG capsule TAKE 2 CAPSULES BY MOUTH 3 TIMES DAILY AS NEEDED FOR ITCHING 180 capsule 0   ibuprofen (ADVIL) 800 MG tablet Take 1 tablet (800 mg total) by mouth every 8 (eight) hours as needed. 60 tablet 3   Lasmiditan Succinate (REYVOW) 100 MG TABS Take 1 tablet by mouth as needed (migraine rescue). 8 tablet 2   magnesium oxide (MAG-OX) 400 MG tablet Take 400 mg by mouth daily.     Multiple Vitamins-Minerals (ZINC PO) Take by mouth.     naratriptan (AMERGE) 2.5 MG tablet TAKE 1 TABLET BY MOUTH AT ONSET OF HEADACHE. IF RETURNS OR DOES NOT RESOLVE MAY REPEAT AFTER 4 HOURS. DO NOT EXCEED 2 TABLETS IN 24 HOURS 10 tablet 11   NONFORMULARY OR COMPOUNDED ITEM Gaba Calm as needed for anxiety     nystatin (MYCOSTATIN) 100000 UNIT/ML suspension Take 5 mLs by mouth four times daily. 60 mL 0   ondansetron (ZOFRAN) 8 MG tablet TAKE 1 TABLET BY MOUTH EVERY 8 HOURS AS NEEDED 20 tablet 3   pantoprazole (PROTONIX) 40 MG tablet TAKE 1 TABLET BY MOUTH ONCE DAILY 90 tablet 3   Probiotic Product (PROBIOTIC DAILY PO) Take by mouth.     Pseudoephedrine HCl (SUDAFED PO) Take 1 tablet by mouth daily as needed.     RESTASIS 0.05 %  ophthalmic emulsion Place 1 drop into both eyes 2 (two)  times daily.     Rimegepant Sulfate (NURTEC) 75 MG TBDP Take 75 mg by mouth every other day. 16 tablet 11   rosuvastatin (CRESTOR) 40 MG tablet TAKE 1 TABLET BY MOUTH ONCE DAILY 30 tablet 2   sertraline (ZOLOFT) 100 MG tablet Take 1 and 1/2 tablets by mouth daily. 45 tablet 1   tiZANidine (ZANAFLEX) 4 MG tablet TAKE 1 TABLET BY MOUTH 3 TIMES DAILY 90 tablet 2   topiramate (TOPAMAX) 200 MG tablet TAKE 1 TABLET BY MOUTH ONCE DAILY 30 tablet 5   traZODone (DESYREL) 50 MG tablet TAKE 1/2 TO 1 TABLET BY MOUTH AT BEDTIME AS NEEDED FOR SLEEP 30 tablet 0   TYRVAYA 0.03 MG/ACT SOLN Use 1 spray in each nostril 2 times every day approximately 12 hours apart 8.4 mL 11   UNABLE TO FIND CBD 50 mg     vitamin B-12 (CYANOCOBALAMIN) 500 MCG tablet Take 500 mcg by mouth daily.     VITAMIN D PO Take by mouth.     No current facility-administered medications on file prior to visit.     Review of Systems:  All pertinent positive/negative included in HPI, all other review of systems are negative   Objective:  Physical Exam BP 125/83   Pulse 64  CONSTITUTIONAL: Well-developed, well-nourished female in no acute distress.  EYES: EOM intact ENT: Normocephalic CARDIOVASCULAR: Regular rate  RESPIRATORY: Normal rate.  MUSCULOSKELETAL: Normal ROM SKIN: Warm, dry without erythema  NEUROLOGICAL: Alert, oriented, CN II-XII grossly intact, Appropriate balance PSYCH: Normal behavior, mood   Assessment & Plan:  Assessment: Chronic migraine   Plan: Will return for Botox asap.  She was due today.  Her migraines are not well controlled at all. Consider adding magnesium supplement. Providing sample of [employer] for pt to try for rescue PRN.  Advised not to drive for 8 hours following use.  Do not combine with triptan. Follow-up asap for Botox.  Bertram Denver, PA-C 09/30/2021 11:01 AM

## 2021-09-30 NOTE — Patient Instructions (Signed)
Migraine Headache A migraine headache is an intense, throbbing pain on one side or both sides of the head. Migraine headaches may also cause other symptoms, such as nausea, vomiting, and sensitivity to light and noise. A migraine headache can last from 4 hours to 3 days. Talk with your doctor about what things may bring on (trigger) your migraine headaches. What are the causes? The exact cause of this condition is not known. However, a migraine may be caused when nerves in the brain become irritated and release chemicals that cause inflammation of blood vessels. This inflammation causes pain. This condition may be triggered or caused by: Drinking alcohol. Smoking. Taking medicines, such as: Medicine used to treat chest pain (nitroglycerin). Birth control pills. Estrogen. Certain blood pressure medicines. Eating or drinking products that contain nitrates, glutamate, aspartame, or tyramine. Aged cheeses, chocolate, or caffeine may also be triggers. Doing physical activity. Other things that may trigger a migraine headache include: Menstruation. Pregnancy. Hunger. Stress. Lack of sleep or too much sleep. Weather changes. Fatigue. What increases the risk? The following factors may make you more likely to experience migraine headaches: Being a certain age. This condition is more common in people who are 25-55 years old. Being female. Having a family history of migraine headaches. Being Caucasian. Having a mental health condition, such as depression or anxiety. Being obese. What are the signs or symptoms? The main symptom of this condition is pulsating or throbbing pain. This pain may: Happen in any area of the head, such as on one side or both sides. Interfere with daily activities. Get worse with physical activity. Get worse with exposure to bright lights or loud noises. Other symptoms may include: Nausea. Vomiting. Dizziness. General sensitivity to bright lights, loud noises, or  smells. Before you get a migraine headache, you may get warning signs (an aura). An aura may include: Seeing flashing lights or having blind spots. Seeing bright spots, halos, or zigzag lines. Having tunnel vision or blurred vision. Having numbness or a tingling feeling. Having trouble talking. Having muscle weakness. Some people have symptoms after a migraine headache (postdromal phase), such as: Feeling tired. Difficulty concentrating. How is this diagnosed? A migraine headache can be diagnosed based on: Your symptoms. A physical exam. Tests, such as: CT scan or an MRI of the head. These imaging tests can help rule out other causes of headaches. Taking fluid from the spine (lumbar puncture) and analyzing it (cerebrospinal fluid analysis, or CSF analysis). How is this treated? This condition may be treated with medicines that: Relieve pain. Relieve nausea. Prevent migraine headaches. Treatment for this condition may also include: Acupuncture. Lifestyle changes like avoiding foods that trigger migraine headaches. Biofeedback. Cognitive behavioral therapy. Follow these instructions at home: Medicines Take over-the-counter and prescription medicines only as told by your health care provider. Ask your health care provider if the medicine prescribed to you: Requires you to avoid driving or using heavy machinery. Can cause constipation. You may need to take these actions to prevent or treat constipation: Drink enough fluid to keep your urine pale yellow. Take over-the-counter or prescription medicines. Eat foods that are high in fiber, such as beans, whole grains, and fresh fruits and vegetables. Limit foods that are high in fat and processed sugars, such as fried or sweet foods. Lifestyle Do not drink alcohol. Do not use any products that contain nicotine or tobacco, such as cigarettes, e-cigarettes, and chewing tobacco. If you need help quitting, ask your health care  provider. Get at least 8   hours of sleep every night. Find ways to manage stress, such as meditation, deep breathing, or yoga. General instructions   Keep a journal to find out what may trigger your migraine headaches. For example, write down: What you eat and drink. How much sleep you get. Any change to your diet or medicines. If you have a migraine headache: Avoid things that make your symptoms worse, such as bright lights. It may help to lie down in a dark, quiet room. Do not drive or use heavy machinery. Ask your health care provider what activities are safe for you while you are experiencing symptoms. Keep all follow-up visits as told by your health care provider. This is important. Contact a health care provider if: You develop symptoms that are different or more severe than your usual migraine headache symptoms. You have more than 15 headache days in one month. Get help right away if: Your migraine headache becomes severe. Your migraine headache lasts longer than 72 hours. You have a fever. You have a stiff neck. You have vision loss. Your muscles feel weak or like you cannot control them. You start to lose your balance often. You have trouble walking. You faint. You have a seizure. Summary A migraine headache is an intense, throbbing pain on one side or both sides of the head. Migraines may also cause other symptoms, such as nausea, vomiting, and sensitivity to light and noise. This condition may be treated with medicines and lifestyle changes. You may also need to avoid certain things that trigger a migraine headache. Keep a journal to find out what may trigger your migraine headaches. Contact your health care provider if you have more than 15 headache days in a month or you develop symptoms that are different or more severe than your usual migraine headache symptoms. This information is not intended to replace advice given to you by your health care provider. Make sure you  discuss any questions you have with your health care provider. Document Revised: 03/21/2019 Document Reviewed: 01/09/2019 Elsevier Patient Education  2022 [employer] Inc.  

## 2021-10-03 ENCOUNTER — Other Ambulatory Visit (HOSPITAL_BASED_OUTPATIENT_CLINIC_OR_DEPARTMENT_OTHER): Payer: Self-pay

## 2021-10-04 ENCOUNTER — Other Ambulatory Visit (HOSPITAL_COMMUNITY): Payer: Self-pay

## 2021-10-07 ENCOUNTER — Other Ambulatory Visit (HOSPITAL_COMMUNITY): Payer: Self-pay

## 2021-10-07 ENCOUNTER — Other Ambulatory Visit: Payer: Self-pay | Admitting: Physician Assistant

## 2021-10-07 DIAGNOSIS — M542 Cervicalgia: Secondary | ICD-10-CM

## 2021-10-07 MED ORDER — TIZANIDINE HCL 4 MG PO TABS
ORAL_TABLET | Freq: Three times a day (TID) | ORAL | 2 refills | Status: DC
  Filled 2021-10-07: qty 90, 30d supply, fill #0
  Filled 2022-01-19: qty 90, 30d supply, fill #1
  Filled 2022-02-24: qty 90, 30d supply, fill #2

## 2021-10-12 ENCOUNTER — Other Ambulatory Visit (HOSPITAL_COMMUNITY): Payer: Self-pay

## 2021-10-18 ENCOUNTER — Other Ambulatory Visit (HOSPITAL_COMMUNITY): Payer: Self-pay

## 2021-10-18 ENCOUNTER — Other Ambulatory Visit: Payer: Self-pay

## 2021-10-18 ENCOUNTER — Ambulatory Visit (INDEPENDENT_AMBULATORY_CARE_PROVIDER_SITE_OTHER): Payer: 59 | Admitting: Cardiovascular Disease

## 2021-10-18 VITALS — BP 130/82 | HR 59 | Ht 64.0 in | Wt 167.9 lb

## 2021-10-18 DIAGNOSIS — I10 Essential (primary) hypertension: Secondary | ICD-10-CM

## 2021-10-18 DIAGNOSIS — E7800 Pure hypercholesterolemia, unspecified: Secondary | ICD-10-CM

## 2021-10-18 MED ORDER — AMLODIPINE BESYLATE 5 MG PO TABS
5.0000 mg | ORAL_TABLET | Freq: Every day | ORAL | 3 refills | Status: DC
  Filled 2021-10-18 – 2021-10-31 (×2): qty 90, 90d supply, fill #0
  Filled 2022-01-19: qty 90, 90d supply, fill #1
  Filled 2022-02-24 – 2022-05-12 (×2): qty 90, 90d supply, fill #2
  Filled 2022-08-14 – 2022-09-04 (×2): qty 90, 90d supply, fill #3

## 2021-10-18 MED ORDER — ROSUVASTATIN CALCIUM 40 MG PO TABS
ORAL_TABLET | Freq: Every day | ORAL | 3 refills | Status: DC
  Filled 2021-10-18 – 2021-11-01 (×2): qty 90, 90d supply, fill #0
  Filled 2022-02-24: qty 90, 90d supply, fill #1
  Filled 2022-06-12: qty 90, 90d supply, fill #2
  Filled 2022-08-14 – 2022-09-04 (×2): qty 90, 90d supply, fill #3

## 2021-10-18 NOTE — Assessment & Plan Note (Signed)
BP has been controlled.  It is slightly above goal today because she is dealing with a migraine.  Continue amlodipine.

## 2021-10-18 NOTE — Progress Notes (Signed)
Cardiology Office Note   Date:  12/11/2021   ID:  [name], DOB Apr 16, 1961, MRN [id number]  PCP:  Zola Button, Grayling Congress, DO  Cardiologist:   Chilton Si, MD   No chief complaint on file.     History of Present Illness: [name] is a 60 y.o. female with hypertension and hyperlipidemia who presents for follow up.  She was first seen 01/2017 for an evaluation of chest pain and an abnormal EKG.  She was referred for an Lexiscan Myoview that revealed LVEF 53% and no ischemia. She also reported dizziness and her heart rate was noted to be 45 bpm.  Metoprolol was switched to amlodipine.  She reported high blood pressures and it was recommended that she reduce alcohol and Sudafed use.  Based on her lipid profile she was started on rosuvastatin 40 mg daily.  Her evening blood pressures were elevated so amlodipine was split into 2.5 mg twice daily.  Since making that change of blood pressure has been very well-controlled and she only had one elevated reading.   2019 was a stressful year for [name].  Her sister died of an overdose.  Her husband left her one year ago.  She struggled with heavy alcohol use and anxiety.  She was treated for two months and is doing much better now.  Lately she has been doing well physically. At her appointment last year her blood pressure was elevated but she was under a lot of stress at the time but she was otherwise doing well.   Today she says she is doing good, aside from a slight headache. She reports having a little chest pain while walking up hills. She describes her pain as "not terrible," and this is not very concerning for her. 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 She also endorses migraines, and has noticed associated readings as high as 150/91. To help lower her blood pressure she takes Nurtec every other day. Occasionally, she is lightheaded when she suffers from migraines, but this is baseline for her. She  states that she exercises "a little." Since her last visit she has lost 11 lbs. She enjoys eating noodles, but she has been trying to eat better with more fish. From the [employer] she has fresh sea bass and salmon delivered. Previously she had issues with alcohol consumption. However, she has successfully cut down on her drinking. She says she has not had COVID despite working in urgent care. She denies any palpitations, or shortness of breath. No orthopnea, PND, or lower extremity edema.    Past Medical History:  Diagnosis Date   Allergy    seasonal allergies   Anxiety    on meds   Arthritis    on meds   Depression    on meds   Dizziness 03/05/2017   Essential hypertension 03/05/2017   on meds   GERD (gastroesophageal reflux disease)    on meds   Hyperlipidemia 03/05/2017   on meds   Infertility, female    Migraines    Osteopenia     Past Surgical History:  Procedure Laterality Date   ANKLE SURGERY Left 1979   COLONOSCOPY  2011   MS-F/V-moviprep(exc)-normal -10 yr recall   SHOULDER SURGERY Left 1990     Current Outpatient Medications  Medication Sig Dispense Refill   amLODipine (NORVASC) 5 MG tablet Take 1 tablet (5 mg total) by mouth daily. 90 tablet 3   Ascorbic  Acid (VITAMIN C PO) Take by mouth.     aspirin EC 81 MG tablet Take 81 mg by mouth daily.     baclofen (LIORESAL) 10 MG tablet TAKE 1 TABLET BY MOUTH EVERY 8 HOURS AS NEEDED 60 tablet 3   BOTOX 100 units SOLR injection INJECT 100 UNITS INTO THE MUSCLE EVERY 3 MONTHS. 1 each 3   Butalbital-APAP-Caffeine (FIORICET) 50-300-40 MG CAPS Take 1 - 2 capsules by mouth every 8 hours 30 capsule 0   Calcium Carbonate (CALCIUM 600 PO) Take by mouth.     celecoxib (CELEBREX) 100 MG capsule Take 1 capsule (100 mg total) by mouth 2 (two) times daily. 60 capsule 3   COVID-19 At Home Antigen Test (CARESTART COVID-19 HOME TEST) KIT Use as directed within package instructions 4 each 0   cycloSPORINE, PF, (CEQUA) 0.09 %  SOLN Place 1 drop into affected eyes 2 times a day approximately 12 hours apart 180 each 0   ezetimibe (ZETIA) 10 MG tablet Take 1 tablet by mouth daily. 30 tablet 2   fluticasone (FLONASE) 50 MCG/ACT nasal spray Place 2 sprays into both nostrils daily. 16 g 1   Fluticasone Propionate (FLONASE NA) Place into the nose.     Galcanezumab-gnlm (EMGALITY) 120 MG/ML SOAJ INJECT 1 PEN EVERY 30 DAYS 1 mL 11   HYDROcodone-acetaminophen (NORCO/VICODIN) 5-325 MG tablet Take 1 tablet by mouth every 8  hours as needed. 15 tablet 0   hydrOXYzine (VISTARIL) 50 MG capsule TAKE 2 CAPSULES BY MOUTH 3 TIMES DAILY AS NEEDED FOR ITCHING 180 capsule 2   ibuprofen (ADVIL) 800 MG tablet Take 1 tablet (800 mg total) by mouth every 8 (eight) hours as needed. 60 tablet 3   magnesium oxide (MAG-OX) 400 MG tablet Take 400 mg by mouth daily.     Multiple Vitamins-Minerals (ZINC PO) Take by mouth.     naratriptan (AMERGE) 2.5 MG tablet TAKE 1 TABLET BY MOUTH AT ONSET OF HEADACHE. IF HA RETURNS OR DOES NOT RESOLVE MAY REPEAT AFTER 4 HOURS. DO NOT EXCEED 2 TABLETS IN 24 HOURS 10 tablet 11   NONFORMULARY OR COMPOUNDED ITEM Gaba Calm as needed for anxiety     nystatin (MYCOSTATIN) 100000 UNIT/ML suspension Take 5 mLs by mouth four times daily. 60 mL 0   ondansetron (ZOFRAN) 8 MG tablet TAKE 1 TABLET BY MOUTH EVERY 8 HOURS AS NEEDED 20 tablet 3   pantoprazole (PROTONIX) 40 MG tablet TAKE 1 TABLET BY MOUTH ONCE DAILY 90 tablet 3   Probiotic Product (PROBIOTIC DAILY PO) Take by mouth.     Pseudoephedrine HCl (SUDAFED PO) Take 1 tablet by mouth daily as needed.     RESTASIS 0.05 % ophthalmic emulsion Place 1 drop into both eyes 2 (two) times daily.     Rimegepant Sulfate (NURTEC) 75 MG TBDP Take 75 mg by mouth every other day. 16 tablet 11   rosuvastatin (CRESTOR) 40 MG tablet TAKE 1 TABLET BY MOUTH ONCE DAILY 90 tablet 3   sertraline (ZOLOFT) 100 MG tablet Take 1 and 1/2 tablets by mouth daily. 45 tablet 2   tiZANidine (ZANAFLEX) 4  MG tablet TAKE 1 TABLET BY MOUTH 3 TIMES DAILY 90 tablet 2   topiramate (TOPAMAX) 200 MG tablet TAKE 1 TABLET BY MOUTH ONCE DAILY 30 tablet 6   traZODone (DESYREL) 50 MG tablet TAKE 1/2 TO 1 TABLET BY MOUTH AT BEDTIME AS NEEDED FOR SLEEP 30 tablet 0   TYRVAYA 0.03 MG/ACT SOLN Use 1 spray in each nostril  2 times every day approximately 12 hours apart 8.4 mL 11   UNABLE TO FIND CBD 50 mg     vitamin B-12 (CYANOCOBALAMIN) 500 MCG tablet Take 500 mcg by mouth daily.     VITAMIN D PO Take by mouth.     No current facility-administered medications for this visit.    Allergies:   Augmentin [amoxicillin-pot clavulanate]    Social History:  The patient  reports that she quit smoking about 29 years ago. Her smoking use included cigarettes. She has never used smokeless tobacco. She reports current alcohol use of about 12.0 standard drinks per week. She reports that she does not use drugs.   Family History:  The patient's family history includes Alcohol abuse in her father and paternal grandfather; Allergic rhinitis in her mother; Cancer in her maternal grandmother; Eczema in her daughter; Heart disease in her maternal grandfather; Hyperlipidemia in her mother; Hypertension in her mother; Sleep apnea in her mother; Stroke in her father and another family member.    ROS:   Please see the history of present illness. (+) Chest pain (+) Migraines (+) Lightheadedness All other systems are reviewed and negative.    PHYSICAL EXAM: VS:  BP 130/82 (BP Location: Left Arm, Patient Position: Sitting, Cuff Size: Normal)   Pulse (!) 59   Ht $R'5\' 4"'jE$  (1.626 m)   Wt 167 lb 14.4 oz (76.2 kg)   BMI 28.82 kg/m  , BMI Body mass index is 28.82 kg/m. GENERAL:  Well appearing HEENT: Pupils equal round and reactive, fundi not visualized, oral mucosa unremarkable NECK:  No jugular venous distention, waveform within normal limits, carotid upstroke brisk and symmetric, no bruits LUNGS:  Clear to auscultation  bilaterally HEART:  RRR.  PMI not displaced or sustained,S1 and S2 within normal limits, no S3, no S4, no clicks, no rubs, no murmurs ABD:  Flat, positive bowel sounds normal in frequency in pitch, no bruits, no rebound, no guarding, no midline pulsatile mass, no hepatomegaly, no splenomegaly EXT:  2 plus pulses throughout, no edema, no cyanosis no clubbing SKIN:  No rashes no nodules NEURO:  Cranial nerves II through XII grossly intact, motor grossly intact throughout PSYCH:  Cognitively intact, oriented to person place and time  EKG:   01/15/17: sinus bradycardia. Rate 45 bpm. Anterior T-wave inversions. 09/27/18: Sinus bradycardia.  Rate 58 bpm. 03/24/2020: Sinus rhythm.  Rate 65 bpm.  Anterior T wave inversions. 10/18/2021: Sinus bradycardia rate 59 low voltage. LAFB.  Exercise Myoview 02/20/17: Nuclear stress EF: 53%. The left ventricular ejection fraction is mildly decreased (45-54%) / lower limit of normal There was no ST segment deviation noted during stress. The study is normal. This is a low risk study.   Recent Labs: 09/02/2021: ALT 39; BUN 17; Creatinine, Ser 0.82; Hemoglobin 12.7; Platelets 277.0; Potassium 3.5; Sodium 139; TSH 1.84    Lipid Panel    Component Value Date/Time   CHOL 222 (H) 09/02/2021 1019   CHOL 189 03/24/2020 0943   TRIG 292.0 (H) 09/02/2021 1019   HDL 53.90 09/02/2021 1019   HDL 75 03/24/2020 0943   CHOLHDL 4 09/02/2021 1019   VLDL 58.4 (H) 09/02/2021 1019   LDLCALC 53 02/25/2021 0956   LDLCALC 82 03/24/2020 0943   LDLDIRECT 137.0 09/02/2021 1019      Wt Readings from Last 3 Encounters:  11/11/21 166 lb (75.3 kg)  10/18/21 167 lb 14.4 oz (76.2 kg)  09/02/21 168 lb (76.2 kg)      ASSESSMENT AND PLAN:  Essential hypertension BP has been controlled.  It is slightly above goal today because she is dealing with a migraine.  Continue amlodipine.    Hyperlipidemia Continue rosuvastatin.  She was recently started on Zetia for  hypertriglyceridemia.  Discussed the importance of limiting carbohydrates and alcohol.  She notes that she has been doing much better with alcohol intake this year.  We also noted the increasing exercise to least 150 minutes weekly will help with her triglycerides as well.  Current medicines are reviewed at length with the patient today.  The patient does not have concerns regarding medicines.  The following changes have been made:  None   Labs/ tests ordered today include:   Orders Placed This Encounter  Procedures   EKG 12-Lead    Disposition:   FU with Alexandria Petkus C. Oval Linsey, MD, [facility] in 1 year.   I,Mathew Stumpf,acting as a Education administrator for Alexandria Latch, MD.,have documented all relevant documentation on the behalf of Alexandria Latch, MD,as directed by  Alexandria Latch, MD while in the presence of Alexandria Latch, MD.  I, [place] Oval Linsey, MD have reviewed all documentation for this visit.  The documentation of the exam, diagnosis, procedures, and orders on 12/11/2021 are all accurate and complete.   Signed, Alexandria Seawright C. Oval Linsey, MD, [facility]  12/11/2021 5:07 PM    [place]

## 2021-10-18 NOTE — Patient Instructions (Signed)
Medication Instructions:  ?Your physician recommends that you continue on your current medications as directed. Please refer to the Current Medication list given to you today.  ? ?*If you need a refill on your cardiac medications before your next appointment, please call your pharmacy* ? ?Lab Work: ?NONE ? ?Testing/Procedures: ?NONE ? ?Follow-Up: ?At [facility], you and your health needs are our priority.  As part of our continuing mission to provide you with exceptional heart care, we have created designated Provider Care Teams.  These Care Teams include your primary Cardiologist (physician) and Advanced Practice Providers (APPs -  Physician Assistants and Nurse Practitioners) who all work together to provide you with the care you need, when you need it. ? ?We recommend signing up for the patient portal called "MyChart".  Sign up information is provided on this After Visit Summary.  MyChart is used to connect with patients for Virtual Visits (Telemedicine).  Patients are able to view lab/test results, encounter notes, upcoming appointments, etc.  Non-urgent messages can be sent to your provider as well.   ?To learn more about what you can do with MyChart, go to [email or web address].   ? ?Your next appointment:   ?12 month(s) ? ?The format for your next appointment:   ?In Person ? ?Provider:   ?Tiffany Ulen, MD  ? ? ? ? ? ? ?

## 2021-10-18 NOTE — Assessment & Plan Note (Signed)
Continue rosuvastatin.  She was recently started on Zetia for hypertriglyceridemia.  Discussed the importance of limiting carbohydrates and alcohol.  She notes that she has been doing much better with alcohol intake this year.  We also noted the increasing exercise to least 150 minutes weekly will help with her triglycerides as well.

## 2021-10-19 ENCOUNTER — Other Ambulatory Visit (HOSPITAL_COMMUNITY): Payer: Self-pay

## 2021-10-19 MED ORDER — CARESTART COVID-19 HOME TEST VI KIT
PACK | 0 refills | Status: DC
  Filled 2021-10-19 – 2021-10-31 (×2): qty 4, 4d supply, fill #0

## 2021-10-26 ENCOUNTER — Other Ambulatory Visit (HOSPITAL_COMMUNITY): Payer: Self-pay

## 2021-10-27 ENCOUNTER — Other Ambulatory Visit (HOSPITAL_COMMUNITY): Payer: Self-pay

## 2021-10-31 ENCOUNTER — Other Ambulatory Visit (HOSPITAL_COMMUNITY): Payer: Self-pay

## 2021-11-02 ENCOUNTER — Other Ambulatory Visit (HOSPITAL_COMMUNITY): Payer: Self-pay

## 2021-11-11 ENCOUNTER — Encounter: Payer: Self-pay | Admitting: Physician Assistant

## 2021-11-11 ENCOUNTER — Ambulatory Visit (INDEPENDENT_AMBULATORY_CARE_PROVIDER_SITE_OTHER): Payer: 59 | Admitting: Physician Assistant

## 2021-11-11 ENCOUNTER — Other Ambulatory Visit: Payer: Self-pay

## 2021-11-11 VITALS — BP 104/71 | HR 76 | Wt 166.0 lb

## 2021-11-11 DIAGNOSIS — G43709 Chronic migraine without aura, not intractable, without status migrainosus: Secondary | ICD-10-CM | POA: Diagnosis not present

## 2021-11-11 DIAGNOSIS — G43009 Migraine without aura, not intractable, without status migrainosus: Secondary | ICD-10-CM | POA: Diagnosis not present

## 2021-11-11 DIAGNOSIS — M62838 Other muscle spasm: Secondary | ICD-10-CM

## 2021-11-11 MED ORDER — ONABOTULINUMTOXINA 100 UNITS IJ SOLR
100.0000 [IU] | Freq: Once | INTRAMUSCULAR | Status: AC
  Administered 2021-11-11: 100 [IU] via INTRAMUSCULAR

## 2021-11-11 NOTE — Progress Notes (Signed)
S: Pt in office today for Botox injections. Her Botox has been working very well for migraine prevention.    O: BP 104/71   Pulse 76   Wt 166 lb (75.3 kg)   BMI 28.49 kg/m    Botox Procedure Note Vial of Botox was : Supplied by Patient   Botox Dosing by [employer] for Chronic Migraine   Injection Sites for Migraines  Botox 100 units was injected using the dosage in the table above in the pattern shown above.  A: Migraine  Muscle spasm   P: Botox 100 units injected today.  RTC 3 Months.

## 2021-11-22 ENCOUNTER — Other Ambulatory Visit (HOSPITAL_COMMUNITY): Payer: Self-pay

## 2021-12-08 ENCOUNTER — Other Ambulatory Visit (HOSPITAL_COMMUNITY): Payer: Self-pay

## 2021-12-08 ENCOUNTER — Other Ambulatory Visit: Payer: Self-pay | Admitting: Family Medicine

## 2021-12-08 DIAGNOSIS — F418 Other specified anxiety disorders: Secondary | ICD-10-CM

## 2021-12-08 DIAGNOSIS — F419 Anxiety disorder, unspecified: Secondary | ICD-10-CM

## 2021-12-08 MED ORDER — HYDROXYZINE PAMOATE 50 MG PO CAPS
ORAL_CAPSULE | ORAL | 2 refills | Status: DC
  Filled 2021-12-08: qty 180, 30d supply, fill #0
  Filled 2022-06-12: qty 180, 30d supply, fill #1

## 2021-12-08 MED ORDER — SERTRALINE HCL 100 MG PO TABS
150.0000 mg | ORAL_TABLET | Freq: Every day | ORAL | 2 refills | Status: DC
  Filled 2021-12-08: qty 45, 30d supply, fill #0
  Filled 2022-01-19: qty 45, 30d supply, fill #1
  Filled 2022-02-24: qty 45, 30d supply, fill #2

## 2021-12-11 ENCOUNTER — Encounter (HOSPITAL_BASED_OUTPATIENT_CLINIC_OR_DEPARTMENT_OTHER): Payer: Self-pay | Admitting: Cardiovascular Disease

## 2021-12-14 ENCOUNTER — Other Ambulatory Visit (HOSPITAL_COMMUNITY): Payer: Self-pay

## 2021-12-16 ENCOUNTER — Other Ambulatory Visit (HOSPITAL_COMMUNITY): Payer: Self-pay

## 2021-12-16 ENCOUNTER — Encounter: Payer: Self-pay | Admitting: Family Medicine

## 2021-12-20 ENCOUNTER — Other Ambulatory Visit (HOSPITAL_COMMUNITY): Payer: Self-pay

## 2021-12-21 ENCOUNTER — Other Ambulatory Visit (HOSPITAL_COMMUNITY): Payer: Self-pay

## 2022-01-14 ENCOUNTER — Telehealth: Payer: Self-pay | Admitting: Radiology

## 2022-01-14 NOTE — Telephone Encounter (Signed)
Error

## 2022-01-14 NOTE — Telephone Encounter (Signed)
Left voicemail for patient to call [facility] to schedule appointment in April with Nada Maclachlan for Botox injections.

## 2022-01-19 ENCOUNTER — Other Ambulatory Visit (HOSPITAL_COMMUNITY): Payer: Self-pay

## 2022-01-19 ENCOUNTER — Other Ambulatory Visit: Payer: Self-pay | Admitting: Family Medicine

## 2022-01-19 MED ORDER — EZETIMIBE 10 MG PO TABS
10.0000 mg | ORAL_TABLET | Freq: Every day | ORAL | 2 refills | Status: DC
  Filled 2022-01-19: qty 30, 30d supply, fill #0
  Filled 2022-02-24: qty 30, 30d supply, fill #1
  Filled 2022-04-07: qty 30, 30d supply, fill #2

## 2022-02-06 ENCOUNTER — Other Ambulatory Visit (HOSPITAL_COMMUNITY): Payer: Self-pay

## 2022-02-20 ENCOUNTER — Other Ambulatory Visit (HOSPITAL_COMMUNITY): Payer: Self-pay

## 2022-02-20 MED ORDER — TYRVAYA 0.03 MG/ACT NA SOLN
NASAL | 11 refills | Status: DC
  Filled 2022-02-20: qty 8.4, 30d supply, fill #0
  Filled 2022-06-12: qty 8.4, 30d supply, fill #1
  Filled 2022-08-14 – 2022-09-14 (×2): qty 8.4, 30d supply, fill #2

## 2022-02-20 MED ORDER — CEQUA 0.09 % OP SOLN
OPHTHALMIC | 11 refills | Status: DC
  Filled 2022-02-20: qty 60, 30d supply, fill #0

## 2022-02-24 ENCOUNTER — Other Ambulatory Visit: Payer: Self-pay | Admitting: Family Medicine

## 2022-02-24 ENCOUNTER — Other Ambulatory Visit (HOSPITAL_COMMUNITY): Payer: Self-pay

## 2022-02-24 MED ORDER — CELECOXIB 100 MG PO CAPS
100.0000 mg | ORAL_CAPSULE | Freq: Two times a day (BID) | ORAL | 3 refills | Status: DC
  Filled 2022-02-24: qty 60, 30d supply, fill #0
  Filled 2022-04-27: qty 60, 30d supply, fill #1
  Filled 2022-06-12: qty 60, 30d supply, fill #2
  Filled 2022-07-17: qty 60, 30d supply, fill #3

## 2022-02-25 ENCOUNTER — Other Ambulatory Visit (HOSPITAL_COMMUNITY): Payer: Self-pay

## 2022-02-28 ENCOUNTER — Other Ambulatory Visit (HOSPITAL_COMMUNITY): Payer: Self-pay

## 2022-03-01 ENCOUNTER — Other Ambulatory Visit (HOSPITAL_COMMUNITY): Payer: Self-pay

## 2022-03-02 ENCOUNTER — Other Ambulatory Visit (HOSPITAL_COMMUNITY): Payer: Self-pay

## 2022-03-04 ENCOUNTER — Other Ambulatory Visit (HOSPITAL_COMMUNITY): Payer: Self-pay

## 2022-03-06 ENCOUNTER — Other Ambulatory Visit (HOSPITAL_COMMUNITY): Payer: Self-pay

## 2022-03-07 ENCOUNTER — Other Ambulatory Visit (HOSPITAL_COMMUNITY): Payer: Self-pay

## 2022-03-08 ENCOUNTER — Other Ambulatory Visit (HOSPITAL_COMMUNITY): Payer: Self-pay

## 2022-03-10 ENCOUNTER — Other Ambulatory Visit (HOSPITAL_COMMUNITY): Payer: Self-pay

## 2022-03-11 ENCOUNTER — Other Ambulatory Visit (HOSPITAL_COMMUNITY): Payer: Self-pay

## 2022-03-13 DIAGNOSIS — H5203 Hypermetropia, bilateral: Secondary | ICD-10-CM | POA: Diagnosis not present

## 2022-03-14 ENCOUNTER — Other Ambulatory Visit (HOSPITAL_COMMUNITY): Payer: Self-pay

## 2022-03-15 ENCOUNTER — Other Ambulatory Visit (HOSPITAL_COMMUNITY): Payer: Self-pay

## 2022-03-17 ENCOUNTER — Other Ambulatory Visit (HOSPITAL_COMMUNITY): Payer: Self-pay

## 2022-03-20 ENCOUNTER — Other Ambulatory Visit (HOSPITAL_COMMUNITY): Payer: Self-pay

## 2022-03-21 ENCOUNTER — Other Ambulatory Visit (HOSPITAL_COMMUNITY): Payer: Self-pay

## 2022-03-22 ENCOUNTER — Other Ambulatory Visit (HOSPITAL_COMMUNITY): Payer: Self-pay

## 2022-03-22 ENCOUNTER — Telehealth: Payer: 59 | Admitting: Physician Assistant

## 2022-03-22 DIAGNOSIS — R3989 Other symptoms and signs involving the genitourinary system: Secondary | ICD-10-CM | POA: Diagnosis not present

## 2022-03-22 MED ORDER — SULFAMETHOXAZOLE-TRIMETHOPRIM 800-160 MG PO TABS
1.0000 | ORAL_TABLET | Freq: Two times a day (BID) | ORAL | 0 refills | Status: DC
  Filled 2022-03-22: qty 10, 5d supply, fill #0

## 2022-03-22 NOTE — Progress Notes (Signed)

## 2022-03-24 ENCOUNTER — Other Ambulatory Visit (HOSPITAL_COMMUNITY): Payer: Self-pay

## 2022-03-27 ENCOUNTER — Other Ambulatory Visit (HOSPITAL_COMMUNITY): Payer: Self-pay

## 2022-04-01 ENCOUNTER — Other Ambulatory Visit (HOSPITAL_COMMUNITY): Payer: Self-pay

## 2022-04-05 ENCOUNTER — Other Ambulatory Visit (HOSPITAL_COMMUNITY): Payer: Self-pay

## 2022-04-07 ENCOUNTER — Other Ambulatory Visit: Payer: Self-pay | Admitting: Family Medicine

## 2022-04-07 ENCOUNTER — Other Ambulatory Visit (HOSPITAL_COMMUNITY): Payer: Self-pay

## 2022-04-07 ENCOUNTER — Ambulatory Visit (INDEPENDENT_AMBULATORY_CARE_PROVIDER_SITE_OTHER): Payer: 59 | Admitting: Physician Assistant

## 2022-04-07 ENCOUNTER — Encounter: Payer: Self-pay | Admitting: Physician Assistant

## 2022-04-07 DIAGNOSIS — F419 Anxiety disorder, unspecified: Secondary | ICD-10-CM

## 2022-04-07 DIAGNOSIS — G43009 Migraine without aura, not intractable, without status migrainosus: Secondary | ICD-10-CM

## 2022-04-07 DIAGNOSIS — M62838 Other muscle spasm: Secondary | ICD-10-CM

## 2022-04-07 DIAGNOSIS — M542 Cervicalgia: Secondary | ICD-10-CM | POA: Diagnosis not present

## 2022-04-07 DIAGNOSIS — G43809 Other migraine, not intractable, without status migrainosus: Secondary | ICD-10-CM | POA: Diagnosis not present

## 2022-04-07 MED ORDER — TIZANIDINE HCL 4 MG PO TABS
ORAL_TABLET | Freq: Three times a day (TID) | ORAL | 2 refills | Status: DC
  Filled 2022-04-07: qty 90, 30d supply, fill #0
  Filled 2022-06-12: qty 90, 30d supply, fill #1
  Filled 2022-07-17: qty 90, 30d supply, fill #2

## 2022-04-07 MED ORDER — EMGALITY 120 MG/ML ~~LOC~~ SOAJ
SUBCUTANEOUS | 11 refills | Status: AC
  Filled 2022-04-07: qty 1, 30d supply, fill #0
  Filled 2022-06-12: qty 1, 30d supply, fill #1
  Filled 2022-07-17: qty 1, 30d supply, fill #2
  Filled 2022-09-04: qty 1, 30d supply, fill #3
  Filled 2022-10-12: qty 1, 30d supply, fill #4
  Filled 2022-11-09: qty 1, 30d supply, fill #5

## 2022-04-07 MED ORDER — BUTALBITAL-APAP-CAFFEINE 50-300-40 MG PO CAPS
ORAL_CAPSULE | ORAL | 1 refills | Status: DC
  Filled 2022-04-07: qty 30, 5d supply, fill #0
  Filled 2022-06-12: qty 30, 5d supply, fill #1

## 2022-04-07 MED ORDER — BACLOFEN 10 MG PO TABS
ORAL_TABLET | Freq: Three times a day (TID) | ORAL | 4 refills | Status: DC | PRN
  Filled 2022-04-07: qty 60, 20d supply, fill #0
  Filled 2022-05-12: qty 60, 20d supply, fill #1
  Filled 2022-07-17: qty 60, 20d supply, fill #2
  Filled 2022-09-14: qty 60, 20d supply, fill #3

## 2022-04-07 MED ORDER — ONDANSETRON HCL 8 MG PO TABS
ORAL_TABLET | Freq: Three times a day (TID) | ORAL | 6 refills | Status: AC | PRN
  Filled 2022-04-07: qty 30, 10d supply, fill #0
  Filled 2022-10-19: qty 30, 10d supply, fill #1
  Filled 2022-11-09: qty 30, 10d supply, fill #2
  Filled 2023-01-18: qty 30, 10d supply, fill #3
  Filled 2023-03-15: qty 30, 10d supply, fill #4

## 2022-04-07 MED ORDER — FLUTICASONE PROPIONATE 50 MCG/ACT NA SUSP
2.0000 | Freq: Every day | NASAL | 1 refills | Status: DC
  Filled 2022-04-07: qty 16, 30d supply, fill #0
  Filled 2022-06-12: qty 16, 30d supply, fill #1

## 2022-04-07 MED ORDER — SERTRALINE HCL 100 MG PO TABS
150.0000 mg | ORAL_TABLET | Freq: Every day | ORAL | 2 refills | Status: DC
  Filled 2022-04-07: qty 45, 30d supply, fill #0
  Filled 2022-05-12: qty 45, 30d supply, fill #1
  Filled 2022-06-12: qty 45, 30d supply, fill #2

## 2022-04-07 MED ORDER — TOPIRAMATE 200 MG PO TABS
ORAL_TABLET | Freq: Every day | ORAL | 11 refills | Status: DC
  Filled 2022-04-07: qty 30, 30d supply, fill #0
  Filled 2022-05-12: qty 30, 30d supply, fill #1
  Filled 2022-06-12: qty 30, 30d supply, fill #2
  Filled 2022-07-17: qty 30, 30d supply, fill #3
  Filled 2022-08-14: qty 30, 30d supply, fill #4
  Filled 2022-09-28 – 2022-10-12 (×2): qty 30, 30d supply, fill #5
  Filled 2022-11-09: qty 30, 30d supply, fill #6
  Filled 2023-01-18: qty 30, 30d supply, fill #7
  Filled 2023-02-19: qty 30, 30d supply, fill #8

## 2022-04-07 MED ORDER — RESTASIS 0.05 % OP EMUL
1.0000 [drp] | Freq: Two times a day (BID) | OPHTHALMIC | 1 refills | Status: DC
  Filled 2022-04-07: qty 24, 240d supply, fill #0

## 2022-04-07 MED ORDER — ONABOTULINUMTOXINA 100 UNITS IJ SOLR
100.0000 [IU] | Freq: Once | INTRAMUSCULAR | Status: AC
  Administered 2022-04-07: 100 [IU] via INTRAMUSCULAR

## 2022-04-07 NOTE — Progress Notes (Signed)
Pt here to F/U on HA's and Botox  ? ?Pt states she is doing better has not had a severe migraine only mild HA. ?States Nurtec is helping.  ? ?S: Pt in office today for Botox injections. Her Botox has been working very well for migraine prevention.  ?  ?O: BP 99/65   Pulse 75   Wt 173 lb (78.5 kg)   BMI 29.70 kg/m?  ? ? ?Botox Procedure Note ?Vial of Botox was : Sent by pharmacy to office ? ? ?Botox Dosing by [employer] for Chronic Migraine ? ? ?Injection Sites for Migraines ? ?Botox 100 units was injected using the dosage in the table above in the pattern shown above. ? ?A: Migraine  ?Muscle spasm  ? ?P: Botox 100 units injected today.  ?Meds refilled as needed ?RTC 3 Months.  ? ? ?

## 2022-04-07 NOTE — Patient Instructions (Signed)
Botulinum Toxin Cosmetic Injection ?Botulinum toxin injection is a procedure to soften lines and wrinkles in the face. Botulinum toxin is produced by a bacterium called Clostridium botulinum. The toxin is injected into muscles with a very thin needle. The toxin works by blocking nerve impulses to specific muscles. This weakens and paralyzes those muscles for a short time, which reduces the lines of facial expression. ?Tell a health care provider about: ?Any allergies you have, especially allergies to eggs or milk protein. ?All medicines you are taking, including antibiotics, vitamins, herbs, eye drops, creams, and over-the-counter medicines. ?Any problems you or family members have had with anesthetic medicines. ?Any bleeding problems you have. ?Any surgeries you have had. ?Any medical conditions you have, including neurological disorders, hyperthyroidism, lung disease, or heart disease. ?Whether you are pregnant, may be pregnant, or are breastfeeding. ?What are the risks? ?Generally, this is a safe procedure. However, complications may occur. Common complications include pain, swelling, and bruising at the injection sites. Other problems may include: ?Pain in the neck or jaw. ?Allergic reaction to the toxin. ?Infection. ?Drooping of the eyebrow or eyelid. ?Double or blurred vision. ?Changes in voice or speech. ?Losing the ability to close one or both eyes. ?Trouble swallowing. ?What happens before the procedure? ?Ask your health care provider about: ?Changing or stopping your regular medicines. This is especially important if you are taking diabetes medicines or blood thinners. ?Taking medicines such as aspirin and ibuprofen. These medicines can thin your blood. Do not take these medicines unless your health care provider tells you to take them. ?Taking over-the-counter medicines, vitamins, herbs, and supplements. ?Do not drink alcohol if: ?Your health care provider tells you not to drink. ?You are pregnant, may be  pregnant, or are planning to become pregnant. ?If you drink alcohol: ?Limit how much you have to: ?0-1 drink a day for women. ?0-2 drinks a day for men. ?Be aware of how much alcohol is in your drink. In the [place] one drink equals one 12 oz bottle of beer (355 mL), one 5 oz glass of wine (148 mL), or one 1? oz glass of hard liquor (44 mL). ?What happens during the procedure? ? ?The treatment site may have ice applied or you may be given a medicine to numb the area (local anesthetic) to reduce discomfort. ?Your health care provider will inject small amounts of the toxin into specific muscles. ?The number of injections that you get will depend on your specific condition and the area that is being treated. ?The procedure may vary among health care providers and hospitals. ?What can I expect after the procedure? ?After your procedure, it is common to have: ?Mild pain, soreness, swelling, itching, or redness around the injection sites. Redness may last for 1-2 days. In rare cases, it may last longer than 2 days. ?Small bruises around the injection sites. These will go away in a few days. ?Bumps or marks from the needle. These will go away within a few hours. ?Brief numbness near the injection sites. ?Headache. This is rare in cosmetic treatment. ?The results of your treatment may take up to 14 days to fully show, although many people will see the benefits in 3-5 days after treatment. In most people, the benefits last about 3-4 months. The effectiveness of botulinum toxin will last longer with follow-up treatments. ?Follow these instructions at home: ?Relieving pain and discomfort ? ?Take over-the-counter and prescription medicines only as told by your health care provider. ?If directed, apply ice to the injected area: ?Put  ice in a plastic bag. ?Place a towel between your skin and the bag. ?Leave the ice on for 20 minutes, 2-3 times per day. ?General instructions ?Do not lie down for 4 hours after treatment or as told by  your health care provider. ?Do not rub the injected area for at least 4 hours after the procedure. This can spread the botulinum toxin to surrounding muscles. ?Depending on the injection site: ?Your health care provider may ask you to avoid moving the muscles in the injected area. Follow instructions from your health care provider. ?Your health care provider may tell you to frown or squint regularly. You may need to do these facial exercises every 15 minutes for 1 hour after treatment, or as told by your health care provider. Follow instructions from your health care provider. ?Do not do any strenuous activity for 12 hours after the procedure or for as long as told by your health care provider. This includes lifting heavy items, working out, doing yoga, or doing activities that increase your heart rate. ?Do not get laser treatments, facials, or facial massages for 1-2 weeks after the procedure or for as long as told by your health care provider. ?Keep all follow-up visits as told by your health care provider. This is important. ?Contact a health care provider if: ?You have a headache that gets worse. ?You have a fever. ?Get help right away if you: ?You develop signs of an allergic reaction. These include: ?An itchy rash or welts. ?Wheezing or shortness of breath. ?Dizziness. ?Your eyelids become droopy or swollen. ?You have trouble speaking. ?You feel short of breath or have trouble breathing. ?You have trouble swallowing. ?Summary ?Botulinum toxin injection is a procedure to soften lines and wrinkles in the face. ?This is a safe procedure. However, some problems may occur, including infection, drooping eyelid, double or blurred vision, and trouble swallowing. ?Follow your health care provider's instructions before the procedure. These may include stopping or changing medicines or stopping alcohol use. ?You will be told how to care for yourself after the procedure. ?Get help right away if you develop signs of an  allergic reaction. Also, get help right away if your eyelids become droopy or swollen, you have trouble speaking or swallowing, or you feel short of breath or have trouble breathing. ?This information is not intended to replace advice given to you by your health care provider. Make sure you discuss any questions you have with your health care provider. ?Document Revised: 09/08/2021 Document Reviewed: 09/08/2021 ?Elsevier Patient Education ? 2023 [employer] ?Migraine Headache ?A migraine headache is an intense, throbbing pain on one side or both sides of the head. Migraine headaches may also cause other symptoms, such as nausea, vomiting, and sensitivity to light and noise. A migraine headache can last from 4 hours to 3 days. Talk with your doctor about what things may bring on (trigger) your migraine headaches. ?What are the causes? ?The exact cause of this condition is not known. However, a migraine may be caused when nerves in the brain become irritated and release chemicals that cause inflammation of blood vessels. This inflammation causes pain. This condition may be triggered or caused by: ?Drinking alcohol. ?Smoking. ?Taking medicines, such as: ?Medicine used to treat chest pain (nitroglycerin). ?Birth control pills. ?Estrogen. ?Certain blood pressure medicines. ?Eating or drinking products that contain nitrates, glutamate, aspartame, or tyramine. Aged cheeses, chocolate, or caffeine may also be triggers. ?Doing physical activity. ?Other things that may trigger a migraine headache include: ?Menstruation. ?Pregnancy. ?  Hunger. ?Stress. ?Lack of sleep or too much sleep. ?Weather changes. ?Fatigue. ?What increases the risk? ?The following factors may make you more likely to experience migraine headaches: ?Being a certain age. This condition is more common in people who are 25-75 years old. ?Being female. ?Having a family history of migraine headaches. ?Being Caucasian. ?Having a mental health condition, such as  depression or anxiety. ?Being obese. ?What are the signs or symptoms? ?The main symptom of this condition is pulsating or throbbing pain. This pain may: ?Happen in any area of the head, such as on one

## 2022-04-08 ENCOUNTER — Other Ambulatory Visit (HOSPITAL_COMMUNITY): Payer: Self-pay

## 2022-04-10 ENCOUNTER — Other Ambulatory Visit (HOSPITAL_COMMUNITY): Payer: Self-pay

## 2022-04-10 MED ORDER — RESTASIS 0.05 % OP EMUL
1.0000 [drp] | Freq: Two times a day (BID) | OPHTHALMIC | 1 refills | Status: DC
  Filled 2022-04-10: qty 60, 30d supply, fill #0
  Filled 2022-06-12: qty 60, 30d supply, fill #1

## 2022-04-27 ENCOUNTER — Other Ambulatory Visit (HOSPITAL_COMMUNITY): Payer: Self-pay

## 2022-04-28 ENCOUNTER — Other Ambulatory Visit (HOSPITAL_COMMUNITY): Payer: Self-pay

## 2022-05-09 ENCOUNTER — Other Ambulatory Visit (HOSPITAL_COMMUNITY): Payer: Self-pay

## 2022-05-12 ENCOUNTER — Other Ambulatory Visit: Payer: Self-pay | Admitting: Family Medicine

## 2022-05-12 ENCOUNTER — Other Ambulatory Visit (HOSPITAL_COMMUNITY): Payer: Self-pay

## 2022-05-12 MED ORDER — EZETIMIBE 10 MG PO TABS
10.0000 mg | ORAL_TABLET | Freq: Every day | ORAL | 2 refills | Status: DC
  Filled 2022-05-12: qty 30, 30d supply, fill #0
  Filled 2022-06-12: qty 30, 30d supply, fill #1
  Filled 2022-08-14: qty 30, 30d supply, fill #2

## 2022-05-13 ENCOUNTER — Other Ambulatory Visit (HOSPITAL_COMMUNITY): Payer: Self-pay

## 2022-05-17 ENCOUNTER — Other Ambulatory Visit (HOSPITAL_COMMUNITY): Payer: Self-pay

## 2022-05-24 ENCOUNTER — Other Ambulatory Visit (HOSPITAL_COMMUNITY): Payer: Self-pay

## 2022-06-12 ENCOUNTER — Other Ambulatory Visit: Payer: Self-pay | Admitting: Family Medicine

## 2022-06-12 DIAGNOSIS — G4700 Insomnia, unspecified: Secondary | ICD-10-CM

## 2022-06-14 ENCOUNTER — Other Ambulatory Visit (HOSPITAL_COMMUNITY): Payer: Self-pay

## 2022-06-14 MED ORDER — TRAZODONE HCL 50 MG PO TABS
ORAL_TABLET | Freq: Every evening | ORAL | 0 refills | Status: DC | PRN
  Filled 2022-06-14: qty 30, 30d supply, fill #0

## 2022-06-15 ENCOUNTER — Other Ambulatory Visit (HOSPITAL_COMMUNITY): Payer: Self-pay

## 2022-06-19 ENCOUNTER — Other Ambulatory Visit (HOSPITAL_COMMUNITY): Payer: Self-pay

## 2022-06-30 ENCOUNTER — Encounter: Payer: Self-pay | Admitting: Physician Assistant

## 2022-06-30 ENCOUNTER — Ambulatory Visit (INDEPENDENT_AMBULATORY_CARE_PROVIDER_SITE_OTHER): Payer: 59 | Admitting: Physician Assistant

## 2022-06-30 VITALS — BP 113/79 | HR 73 | Wt 173.0 lb

## 2022-06-30 DIAGNOSIS — M62838 Other muscle spasm: Secondary | ICD-10-CM

## 2022-06-30 DIAGNOSIS — G43709 Chronic migraine without aura, not intractable, without status migrainosus: Secondary | ICD-10-CM

## 2022-06-30 DIAGNOSIS — G43009 Migraine without aura, not intractable, without status migrainosus: Secondary | ICD-10-CM | POA: Diagnosis not present

## 2022-06-30 MED ORDER — ONABOTULINUMTOXINA 100 UNITS IJ SOLR
100.0000 [IU] | Freq: Once | INTRAMUSCULAR | Status: AC
  Administered 2022-06-30: 100 [IU] via INTRAMUSCULAR

## 2022-06-30 NOTE — Progress Notes (Signed)
Pt presents for Botox injection today.  Pt states HA have got better has only had 3 moderate HA's that got better with Nurtec.   S: Pt in office today for Botox injections. Her Botox has been working very well for migraine prevention.  Pt notes she did have her left daith pierced which has been good.   O: BP 113/79   Pulse 73   Wt 173 lb (78.5 kg)   BMI 29.70 kg/m    Botox Procedure Note Vial of Botox was : Supplied by Patient - sent to office   Botox Dosing by [facility] for Chronic Migraine   Injection Sites for Migraines  Botox 100 units was injected using the dosage in the table above in the pattern shown above.  A:  1. Migraine without aura and without status migrainosus, not intractable   2. Chronic migraine w/o aura w/o status migrainosus, not intractable   3. Muscle spasm      P: Botox 100 units injected today.  Samples of Nurtec provided. RTC 3 Months.

## 2022-07-17 ENCOUNTER — Other Ambulatory Visit (HOSPITAL_COMMUNITY): Payer: Self-pay

## 2022-07-17 ENCOUNTER — Other Ambulatory Visit: Payer: Self-pay | Admitting: Family Medicine

## 2022-07-17 DIAGNOSIS — F419 Anxiety disorder, unspecified: Secondary | ICD-10-CM

## 2022-07-17 MED ORDER — FLUTICASONE PROPIONATE 50 MCG/ACT NA SUSP
2.0000 | Freq: Every day | NASAL | 5 refills | Status: DC
  Filled 2022-07-17: qty 16, 30d supply, fill #0
  Filled 2022-09-04: qty 16, 30d supply, fill #1
  Filled 2022-10-12: qty 16, 30d supply, fill #2
  Filled 2022-11-09: qty 16, 30d supply, fill #3
  Filled 2023-01-18: qty 16, 30d supply, fill #4
  Filled 2023-04-16 – 2023-04-25 (×2): qty 16, 30d supply, fill #5

## 2022-07-17 MED ORDER — SERTRALINE HCL 100 MG PO TABS
150.0000 mg | ORAL_TABLET | Freq: Every day | ORAL | 3 refills | Status: DC
  Filled 2022-07-17: qty 45, 30d supply, fill #0
  Filled 2022-08-14: qty 45, 30d supply, fill #1
  Filled 2022-09-14: qty 45, 30d supply, fill #2
  Filled 2022-10-19: qty 45, 30d supply, fill #3

## 2022-07-18 ENCOUNTER — Other Ambulatory Visit (HOSPITAL_COMMUNITY): Payer: Self-pay

## 2022-07-18 DIAGNOSIS — M19041 Primary osteoarthritis, right hand: Secondary | ICD-10-CM | POA: Diagnosis not present

## 2022-07-18 DIAGNOSIS — M180 Bilateral primary osteoarthritis of first carpometacarpal joints: Secondary | ICD-10-CM | POA: Diagnosis not present

## 2022-07-18 DIAGNOSIS — M79641 Pain in right hand: Secondary | ICD-10-CM | POA: Diagnosis not present

## 2022-07-18 DIAGNOSIS — G5603 Carpal tunnel syndrome, bilateral upper limbs: Secondary | ICD-10-CM | POA: Diagnosis not present

## 2022-07-18 DIAGNOSIS — M19042 Primary osteoarthritis, left hand: Secondary | ICD-10-CM | POA: Diagnosis not present

## 2022-07-18 DIAGNOSIS — M65312 Trigger thumb, left thumb: Secondary | ICD-10-CM | POA: Diagnosis not present

## 2022-07-18 DIAGNOSIS — M79642 Pain in left hand: Secondary | ICD-10-CM | POA: Diagnosis not present

## 2022-08-12 DIAGNOSIS — S52502A Unspecified fracture of the lower end of left radius, initial encounter for closed fracture: Secondary | ICD-10-CM | POA: Insufficient documentation

## 2022-08-13 ENCOUNTER — Ambulatory Visit
Admission: EM | Admit: 2022-08-13 | Discharge: 2022-08-13 | Disposition: A | Discharge status: Home / Self Care | Payer: 59 | Attending: Family Medicine | Admitting: Family Medicine

## 2022-08-13 ENCOUNTER — Encounter: Payer: Self-pay | Admitting: Emergency Medicine

## 2022-08-13 ENCOUNTER — Ambulatory Visit (INDEPENDENT_AMBULATORY_CARE_PROVIDER_SITE_OTHER): Payer: 59

## 2022-08-13 DIAGNOSIS — M25532 Pain in left wrist: Secondary | ICD-10-CM | POA: Diagnosis not present

## 2022-08-13 DIAGNOSIS — S52592A Other fractures of lower end of left radius, initial encounter for closed fracture: Secondary | ICD-10-CM | POA: Diagnosis not present

## 2022-08-13 DIAGNOSIS — S52502A Unspecified fracture of the lower end of left radius, initial encounter for closed fracture: Secondary | ICD-10-CM | POA: Diagnosis not present

## 2022-08-13 MED ORDER — OXYCODONE-ACETAMINOPHEN 5-325 MG PO TABS: 1.0000 | ORAL_TABLET | Freq: Four times a day (QID) | ORAL | 0 refills | Status: DC | PRN

## 2022-08-13 MED ORDER — ACETAMINOPHEN 325 MG PO TABS
650.0000 mg | ORAL_TABLET | Freq: Once | ORAL | Status: AC
  Administered 2022-08-13: 650 mg via ORAL

## 2022-08-13 NOTE — Discharge Instructions (Signed)
Use ice and elevation to reduce pain and swelling Limit use of arm, stick to light activity Take ibuprofen as needed for pain.  May take 800 mg 3 times a day with food Take Percocet if needed for severe pain.  Do not drive on Percocet.  This should help you sleep Call for problems Follow-up with your sports medicine or orthopedic physician

## 2022-08-13 NOTE — ED Provider Notes (Signed)
[name]    CSN: [id number] Arrival date & time: 08/13/22  1228      History   Chief Complaint Chief Complaint  Patient presents with   Wrist Pain    left    HPI [name] is a 61 y.o. female.   HPI  Patient was at a concert last night.  She slipped on wet grass and fell landing on her outstretched left wrist/it is swollen painful and discolored. Patient is postmenopausal but has no known bone loss, last DEXA was in 2022 Has been using ice, immobilization, ibuprofen at home Past Medical History:  Diagnosis Date   Allergy    seasonal allergies   Anxiety    on meds   Arthritis    on meds   Depression    on meds   Dizziness 03/05/2017   Essential hypertension 03/05/2017   on meds   GERD (gastroesophageal reflux disease)    on meds   Hyperlipidemia 03/05/2017   on meds   Infertility, female    Migraines    Osteopenia     Patient Active Problem List   Diagnosis Date Noted   Depression, major, single episode, severe ([place]) 09/02/2021   Preventative health care 09/02/2021   Strain of lumbar region 07/29/2021   Chronic migraine w/o aura w/o status migrainosus, not intractable 12/24/2020   Trigger finger, acquired 12/15/2019   Chronic migraine 07/05/2018   Stress at home 11/09/2017   Essential hypertension 03/05/2017   Dizziness 03/05/2017   Hyperlipidemia 03/05/2017   Plantar fasciitis of right foot 02/02/2016   Chronic rhinitis 10/20/2015   Cough, persistent 10/20/2015   Neck pain 03/16/2015   Overweight 01/22/2015   Depression with anxiety 07/14/2014   Insomnia 05/28/2012   Migraine 05/28/2012   Muscle spasm 05/28/2012   HEADACHE 04/07/2009    Past Surgical History:  Procedure Laterality Date   ANKLE SURGERY Left 1979   COLONOSCOPY  2011   MS-F/V-moviprep(exc)-normal -10 yr recall   SHOULDER SURGERY Left 1990    OB History     Gravida  1   Para  1   Term  1   Preterm  0   AB  0   Living  1      SAB  0   IAB  0    Ectopic  0   Multiple  0   Live Births  1            Home Medications    Prior to Admission medications   Medication Sig Start Date End Date Taking? Authorizing Provider  oxyCODONE-acetaminophen (PERCOCET/ROXICET) 5-325 MG tablet Take 1 tablet by mouth every 6 (six) hours as needed for severe pain. 08/13/22  Yes Raylene Everts, MD  amLODipine (NORVASC) 5 MG tablet Take 1 tablet (5 mg total) by mouth daily. 10/18/21   Skeet Latch, MD  Ascorbic Acid (VITAMIN C PO) Take by mouth.    [provider]  aspirin EC 81 MG tablet Take 81 mg by mouth daily.    [provider]  baclofen (LIORESAL) 10 MG tablet TAKE 1 TABLET BY MOUTH EVERY 8 HOURS AS NEEDED 04/07/22 04/07/23  Jaclyn Prime, Collene Leyden, PA-C  BOTOX 100 units SOLR injection INJECT 100 UNITS INTO THE MUSCLE EVERY 3 MONTHS. 09/30/21 09/30/22  Teague Carlis Abbott, Collene Leyden, PA-C  Butalbital-APAP-Caffeine (FIORICET) 50-300-40 MG CAPS Take 1-2 capsules by mouth every 8 hours if needed 04/07/22   Jaclyn Prime, Collene Leyden, PA-C  Calcium Carbonate (CALCIUM 600  PO) Take by mouth.    [provider]  celecoxib (CELEBREX) 100 MG capsule Take 1 capsule (100 mg total) by mouth 2 (two) times daily. 02/24/22   Roma Schanz R, DO  CEQUA 0.09 % SOLN Instill 1 drop into the affected eye(S)  2 times every day  approximately 12 hours apart Patient not taking: Reported on 08/13/2022 02/20/22     COVID-19 At Home Antigen Test (CARESTART COVID-19 HOME TEST) KIT Use as directed within package instructions 10/19/21   Edmon Crape, RPH  cycloSPORINE, PF, (CEQUA) 0.09 % SOLN Place 1 drop into affected eyes 2 times a day approximately 12 hours apart Patient not taking: Reported on 08/13/2022 09/07/21     ezetimibe (ZETIA) 10 MG tablet Take 1 tablet by mouth daily. 05/12/22   Roma Schanz R, DO  fluticasone (FLONASE) 50 MCG/ACT nasal spray Place 2 sprays into both nostrils daily. 07/17/22   Ann Held, DO  Fluticasone  Propionate (FLONASE NA) Place into the nose. Patient not taking: Reported on 08/13/2022    [provider]  Galcanezumab-gnlm [facility]) 120 MG/ML SOAJ INJECT 1 PEN EVERY 30 DAYS 04/07/22 04/07/23  Jaclyn Prime, Collene Leyden, PA-C  HYDROcodone-acetaminophen (NORCO/VICODIN) 5-325 MG tablet Take 1 tablet by mouth every 8  hours as needed. Patient not taking: Reported on 08/13/2022 07/28/21   Rosemarie Ax, MD  hydrOXYzine (VISTARIL) 50 MG capsule TAKE 2 CAPSULES BY MOUTH 3 TIMES DAILY AS NEEDED FOR ITCHING 12/08/21   Carollee Herter, Alferd Apa, DO  ibuprofen (ADVIL) 800 MG tablet Take 1 tablet (800 mg total) by mouth every 8 (eight) hours as needed. 07/01/21   Jaclyn Prime, Collene Leyden, PA-C  magnesium oxide (MAG-OX) 400 MG tablet Take 400 mg by mouth daily. Patient not taking: Reported on 08/13/2022    [provider]  Multiple Vitamins-Minerals (ZINC PO) Take by mouth.    [provider]  naratriptan (AMERGE) 2.5 MG tablet TAKE 1 TABLET BY MOUTH AT ONSET OF HEADACHE. IF HA RETURNS OR DOES NOT RESOLVE MAY REPEAT AFTER 4 HOURS. DO NOT EXCEED 2 TABLETS IN 24 HOURS 09/30/21 09/30/22  Jaclyn Prime, Collene Leyden, PA-C  NONFORMULARY OR COMPOUNDED ITEM Gaba Calm as needed for anxiety 05/20/18   Carollee Herter, Alferd Apa, DO  nystatin (MYCOSTATIN) 100000 UNIT/ML suspension Take 5 mLs by mouth four times daily. Patient not taking: Reported on 08/13/2022 06/07/21   Raylene Everts, MD  ondansetron (ZOFRAN) 8 MG tablet TAKE 1 TABLET BY MOUTH EVERY 8 HOURS AS NEEDED 04/07/22 04/07/23  Jaclyn Prime, Collene Leyden, PA-C  pantoprazole (PROTONIX) 40 MG tablet TAKE 1 TABLET BY MOUTH ONCE DAILY 09/02/21 09/02/22  Ann Held, DO  Probiotic Product (PROBIOTIC DAILY PO) Take by mouth.    [provider]  Pseudoephedrine HCl (SUDAFED PO) Take 1 tablet by mouth daily as needed.    [provider]  RESTASIS 0.05 % ophthalmic emulsion Place 1 drop into both eyes 2 times daily. 04/10/22   Ann Held,  DO  Rimegepant Sulfate (NURTEC) 75 MG TBDP Take 75 mg by mouth every other day. 09/30/21   Jaclyn Prime, Collene Leyden, PA-C  rosuvastatin (CRESTOR) 40 MG tablet TAKE 1 TABLET BY MOUTH ONCE DAILY 10/18/21 10/18/22  Skeet Latch, MD  sertraline (ZOLOFT) 100 MG tablet Take 1 and 1/2 tablets by mouth daily. 07/17/22   Ann Held, DO  sulfamethoxazole-trimethoprim (BACTRIM DS) 800-160 MG tablet Take 1 tablet by mouth 2 (two)  times daily. Patient not taking: Reported on 08/13/2022 03/22/22   Mar Daring, PA-C  tiZANidine (ZANAFLEX) 4 MG tablet TAKE 1 TABLET BY MOUTH 3 TIMES DAILY 04/07/22 04/07/23  Jaclyn Prime, Collene Leyden, PA-C  topiramate (TOPAMAX) 200 MG tablet TAKE 1 TABLET BY MOUTH ONCE DAILY 04/07/22 04/07/23  Jaclyn Prime, Collene Leyden, PA-C  traZODone (DESYREL) 50 MG tablet TAKE 1/2 TO 1 TABLET BY MOUTH AT BEDTIME AS NEEDED FOR SLEEP 06/14/22 06/14/23  Carollee Herter, Alferd Apa, DO  TYRVAYA 0.03 MG/ACT SOLN Use 1 spray in each nostril 2 times every day approximately 12 hours apart Patient not taking: Reported on 08/13/2022 09/07/21     TYRVAYA 0.03 MG/ACT SOLN Place 1 spray nasally 2 times every day in each nostril approximately 12 hours apart Patient not taking: Reported on 08/13/2022 02/20/22     UNABLE TO FIND CBD 50 mg    [provider]  vitamin B-12 (CYANOCOBALAMIN) 500 MCG tablet Take 500 mcg by mouth daily.    [provider]  VITAMIN D PO Take by mouth.    [provider]    Family History Family History  Problem Relation Age of Onset   Hypertension Mother    Allergic rhinitis Mother    Sleep apnea Mother    Hyperlipidemia Mother    Cancer Maternal Grandmother    Stroke Other    Eczema Daughter    Alcohol abuse Father    Stroke Father    Heart disease Maternal Grandfather    Alcohol abuse Paternal Grandfather    Colon polyps Neg Hx    Colon cancer Neg Hx    Esophageal cancer Neg Hx    Rectal cancer Neg Hx    Stomach cancer Neg Hx     Social  History Social History   Tobacco Use   Smoking status: Former    Types: Cigarettes    Quit date: 02/09/1992    Years since quitting: 30.5   Smokeless tobacco: Never  Vaping Use   Vaping Use: Never used  Substance Use Topics   Alcohol use: Yes    Alcohol/week: 12.0 standard drinks of alcohol    Types: 12 Standard drinks or equivalent per week   Drug use: No     Allergies   Augmentin [amoxicillin-pot clavulanate]   Review of Systems Review of Systems  See HPI Physical Exam Triage Vital Signs ED Triage Vitals  Enc Vitals Group     BP 08/13/22 1240 110/73     Pulse Rate 08/13/22 1240 (!) 54     Resp 08/13/22 1240 16     Temp 08/13/22 1240 98.3 F (36.8 C)     Temp Source 08/13/22 1240 Oral     SpO2 08/13/22 1240 98 %     Weight 08/13/22 1243 172 lb (78 kg)     Height 08/13/22 1243 $RemoveBefor'5\' 4"'bsRwfRllPlPF$  (1.626 m)     Head Circumference --      Peak Flow --      Pain Score 08/13/22 1242 7     Pain Loc --      Pain Edu? --      Excl. in [place]? --    No data found.  Updated Vital Signs BP 110/73 (BP Location: Right Arm)   Pulse (!) 54   Temp 98.3 F (36.8 C) (Oral)   Resp 16   Ht $R'5\' 4"'Gs$  (1.626 m)   Wt 78 kg   SpO2 98%   BMI 29.52 kg/m  Physical Exam Constitutional:      General: She is not in acute distress.    Appearance: She is well-developed.  HENT:     Head: Normocephalic and atraumatic.  Eyes:     Conjunctiva/sclera: Conjunctivae normal.     Pupils: Pupils are equal, round, and reactive to light.  Cardiovascular:     Rate and Rhythm: Normal rate.  Pulmonary:     Effort: Pulmonary effort is normal. No respiratory distress.  Abdominal:     General: There is no distension.     Palpations: Abdomen is soft.  Musculoskeletal:        General: Swelling, tenderness and signs of injury present. No deformity. Normal range of motion.     Cervical back: Normal range of motion.     Comments: Left wrist has swelling around the distal radius and radiocarpal region.  No  snuffbox tenderness.  There is tenderness to palpation of the distal radius.  Range of motion limited secondary to pain.  Sensory exam normal in fingers.  Skin:    General: Skin is warm and dry.  Neurological:     General: No focal deficit present.     Mental Status: She is alert.     Gait: Gait normal.  Psychiatric:        Mood and Affect: Mood normal.      UC Treatments / Results  Labs (all labs ordered are listed, but only abnormal results are displayed) Labs Reviewed - No data to display  EKG   Radiology DG Wrist Complete Left  Result Date: 08/13/2022 CLINICAL DATA:  Fall on outstretched hand. Tenderness at radial region. Snuffbox tenderness. EXAM: LEFT WRIST - COMPLETE 3+ VIEW COMPARISON:  None Available. FINDINGS: There is an acute, slightly impacted fracture deformity involving the distal radius. The fracture fragments are in near anatomic alignment. On the lateral projection radiograph there is slight dorsal displacement of a fracture fragment. IMPRESSION: Acute, slightly impacted fracture deformity involving the distal radius. Electronically Signed   By: Kerby Moors M.D.   On: 08/13/2022 13:13    Procedures Procedures (including critical care time)  Medications Ordered in UC Medications  acetaminophen (TYLENOL) tablet 650 mg (650 mg Oral Given 08/13/22 1250)    Initial Impression / Assessment and Plan / UC Course  I have reviewed the triage vital signs and the nursing notes.  Pertinent labs & imaging results that were available during my care of the patient were reviewed by me and considered in my medical decision making (see chart for details).     Patient brings with her a brace that was custom made for her for an old wrist injury.  I put it on and checked the fit.  It is appropriate to immobilize this fracture.  She supposed to wear it all of the time.  Ice and elevation, pain management discussed.  Follow-up discussed Final Clinical Impressions(s) / UC Diagnoses    Final diagnoses:  Closed fracture of distal end of right radius, unspecified fracture morphology, initial encounter     Discharge Instructions      Use ice and elevation to reduce pain and swelling Limit use of arm, stick to light activity Take ibuprofen as needed for pain.  May take 800 mg 3 times a day with food Take Percocet if needed for severe pain.  Do not drive on Percocet.  This should help you sleep Call for problems Follow-up with your sports medicine or orthopedic physician   ED Prescriptions  Medication Sig Dispense Auth. Provider   oxyCODONE-acetaminophen (PERCOCET/ROXICET) 5-325 MG tablet Take 1 tablet by mouth every 6 (six) hours as needed for severe pain. 15 tablet Raylene Everts, MD      I have reviewed the PDMP during this encounter.   Raylene Everts, MD 08/13/22 [phone]

## 2022-08-13 NOTE — ED Triage Notes (Signed)
Pain & swelling to left wrist after slipping on grass after a concert at night, landed on her left wrist  Bruising noted  Brace from home  Ibuprofen (800mg ) at 0600

## 2022-08-14 ENCOUNTER — Telehealth: Payer: Self-pay | Admitting: Emergency Medicine

## 2022-08-14 ENCOUNTER — Other Ambulatory Visit (HOSPITAL_COMMUNITY): Payer: Self-pay

## 2022-08-14 NOTE — Telephone Encounter (Signed)
Call to [facility] to see how she was today. She has been elevating and icing as directed. Continues to have pain and swelling. No other concerns or questions at this time. Thanked RN for follow up call

## 2022-08-15 ENCOUNTER — Other Ambulatory Visit (HOSPITAL_COMMUNITY): Payer: Self-pay

## 2022-08-16 ENCOUNTER — Ambulatory Visit (INDEPENDENT_AMBULATORY_CARE_PROVIDER_SITE_OTHER): Payer: 59 | Admitting: Family Medicine

## 2022-08-16 ENCOUNTER — Other Ambulatory Visit (HOSPITAL_COMMUNITY): Payer: Self-pay

## 2022-08-16 DIAGNOSIS — S52502A Unspecified fracture of the lower end of left radius, initial encounter for closed fracture: Secondary | ICD-10-CM | POA: Insufficient documentation

## 2022-08-16 DIAGNOSIS — S52592A Other fractures of lower end of left radius, initial encounter for closed fracture: Secondary | ICD-10-CM

## 2022-08-16 MED ORDER — OXYCODONE-ACETAMINOPHEN 5-325 MG PO TABS
1.0000 | ORAL_TABLET | Freq: Four times a day (QID) | ORAL | 0 refills | Status: DC | PRN
  Filled 2022-08-16: qty 20, 3d supply, fill #0

## 2022-08-16 NOTE — Progress Notes (Signed)
   I, Philbert Riser, LAT, ATC acting as a scribe for [name], MD.  [name] is a 61 y.o. female who presents to [facility] at [facility] today for L wrist pain. Pt was previously seen by Dr. Denyse Amass on 05/27/21 for LBP and R hip pain. Today, pt c/o L wrist pain due to a closed fx of the L distal radius. Pt was seen at the [facility] on 9/3. MOI: Pt was at a concert on 9/2, slipped on the wet grass, and fell on her L wrist. Pt locates pain to all over her L wrist and into forearm. Pt c/o constant pain from the injury w/ no relief from percocet, IBU, etc. 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   L wrist swelling: yes w/ discoloration Treatments tried: wrist brace, ice, Tylenol, percocet, IBU  Dx imaging: 08/13/22 L wrist XR  Pertinent review of systems: No fevers or chills  Relevant historical information: Hypertension   Exam:  BP 112/78   Pulse 75   Ht 5\' 4"  (1.626 m)   Wt 171 lb 9.6 oz (77.8 kg)   SpO2 97%   BMI 29.46 kg/m  General: Well Developed, well nourished, and in no acute distress.   MSK: Left wrist: Bruising and swelling present at dorsal and volar radial wrist. Decreased wrist motion.  Pain with rotation of the wrist and elbow. Pulses cap refill and sensation are intact distally.    Lab and Radiology Results No results found for this or any previous visit (from the past 72 hour(s)). DG Wrist Complete Left  Result Date: 08/13/2022 CLINICAL DATA:  Fall on outstretched hand. Tenderness at radial region. Snuffbox tenderness. EXAM: LEFT WRIST - COMPLETE 3+ VIEW COMPARISON:  None Available. FINDINGS: There is an acute, slightly impacted fracture deformity involving the distal radius. The fracture fragments are in near anatomic alignment. On the lateral projection radiograph there is slight dorsal displacement of a fracture fragment. IMPRESSION: Acute, slightly impacted fracture deformity involving the distal radius. Electronically  Signed   By: 10/13/2022 M.D.   On: 08/13/2022 13:13     I, 10/13/2022, personally (independently) visualized and performed the interpretation of the images attached in this note.  Patient was placed into a custom made sugar-tong fiberglass splint.   Assessment and Plan: 61 y.o. female with left wrist fracture and distal radius. Patient has a short arm splint which is adequate to control wrist motion to flexion extension ulnar and radial deviation but not adequate to control rotation which I think is a source of pain.  Plan to move to a sugar-tong splint which should help to control her wrist motion a little more adequately.  Additionally will increase oxycodone for better pain control.  Check back in 10 days.  At that time anticipate repeat x-ray and likely transition to a short arm splint or cast at that time.   PDMP reviewed during this encounter. No orders of the defined types were placed in this encounter.  Meds ordered this encounter  Medications   oxyCODONE-acetaminophen (PERCOCET/ROXICET) 5-325 MG tablet    Sig: Take 1-2 tablets by mouth every 6 (six) hours as needed for severe pain.    Dispense:  20 tablet    Refill:  0     Discussed warning signs or symptoms. Please see discharge instructions. Patient expresses understanding.   The above documentation has been reviewed and is accurate and complete 62, M.D.

## 2022-08-16 NOTE — Patient Instructions (Signed)
Thank you for coming in today.   Recheck in about 10 days or so.   Use oxycodone as needed sparingly.

## 2022-08-23 NOTE — Progress Notes (Signed)
   I, Philbert Riser, LAT, ATC acting as a scribe for [name], MD.  [name] is a 61 y.o. female who presents to [facility] at [facility] today for f/u fx of the L distal radius. Pt was at a concert on 9/2, slipped on the wet grass, and fell on her L wrist. Pt was seen at the [facility] on 9/3. 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 Pt was last seen by Dr. Denyse Amass on 08/16/22 and was placed in a sugar-tong splint and her oxycodone dose was increased. Today, pt reports improvement in the swelling, but pain is surprisingly still very painful. Pt has been wearing her sugar-tong splint, but her elbow gets irritated from it.   Dx imaging: 08/13/22 L wrist XR  Pertinent review of systems: No fevers or chills  Relevant historical information: Hypertension   Exam:  BP 118/76   Pulse 60   Ht 5\' 4"  (1.626 m)   Wt 171 lb (77.6 kg)   SpO2 98%   BMI 29.35 kg/m  General: Well Developed, well nourished, and in no acute distress.   MSK: Left wrist normal. Tender to palpation distal radius.  Normal elbow motion.    Lab and Radiology Results  X-ray images left wrist obtained today personally and independently interpreted. Faint fracture line through the distal radius without angulation or displacement present. Await formal radiology review    Assessment and Plan: 61 y.o. female with left distal radius fracture without displacement or angulation.  Plan to transition to a short arm Exos cast.  Recheck in 2 weeks.  Anticipate return to work on September 21.  Work note letter written today.   PDMP not reviewed this encounter. Orders Placed This Encounter  Procedures   DG Wrist Complete Left    Standing Status:   Future    Number of Occurrences:   1    Standing Expiration Date:   09/22/2022    Order Specific Question:   Reason for Exam (SYMPTOM  OR DIAGNOSIS REQUIRED)    Answer:   left wrist fracture    Order Specific Question:   Preferred  imaging location?    Answer:   09/24/2022   No orders of the defined types were placed in this encounter.    Discussed warning signs or symptoms. Please see discharge instructions. Patient expresses understanding.   The above documentation has been reviewed and is accurate and complete Kyra Searles, M.D.

## 2022-08-24 ENCOUNTER — Ambulatory Visit (INDEPENDENT_AMBULATORY_CARE_PROVIDER_SITE_OTHER): Payer: 59

## 2022-08-24 ENCOUNTER — Ambulatory Visit (INDEPENDENT_AMBULATORY_CARE_PROVIDER_SITE_OTHER): Payer: 59 | Admitting: Family Medicine

## 2022-08-24 VITALS — BP 118/76 | HR 60 | Ht 64.0 in | Wt 171.0 lb

## 2022-08-24 DIAGNOSIS — S52592A Other fractures of lower end of left radius, initial encounter for closed fracture: Secondary | ICD-10-CM

## 2022-08-24 DIAGNOSIS — S6292XA Unspecified fracture of left wrist and hand, initial encounter for closed fracture: Secondary | ICD-10-CM | POA: Diagnosis not present

## 2022-08-24 NOTE — Patient Instructions (Signed)
Thank you for coming in today.  ? ?Recheck in 2 weeks.  ?

## 2022-08-25 ENCOUNTER — Other Ambulatory Visit (HOSPITAL_COMMUNITY): Payer: Self-pay

## 2022-08-25 ENCOUNTER — Encounter: Payer: Self-pay | Admitting: Family Medicine

## 2022-08-28 NOTE — Progress Notes (Signed)
Wrist fracture shows early healing.

## 2022-08-29 ENCOUNTER — Other Ambulatory Visit (HOSPITAL_COMMUNITY): Payer: Self-pay

## 2022-08-29 MED ORDER — OXYCODONE-ACETAMINOPHEN 5-325 MG PO TABS
1.0000 | ORAL_TABLET | Freq: Four times a day (QID) | ORAL | 0 refills | Status: DC | PRN
  Filled 2022-08-29: qty 10, 2d supply, fill #0

## 2022-09-04 ENCOUNTER — Encounter: Payer: Self-pay | Admitting: Family Medicine

## 2022-09-04 ENCOUNTER — Encounter: Payer: 59 | Admitting: Family Medicine

## 2022-09-04 ENCOUNTER — Other Ambulatory Visit (HOSPITAL_COMMUNITY): Payer: Self-pay

## 2022-09-04 ENCOUNTER — Ambulatory Visit (INDEPENDENT_AMBULATORY_CARE_PROVIDER_SITE_OTHER): Payer: 59 | Admitting: Family Medicine

## 2022-09-04 VITALS — BP 114/80 | HR 56 | Temp 97.9°F | Resp 18 | Ht 64.0 in | Wt 172.4 lb

## 2022-09-04 DIAGNOSIS — F418 Other specified anxiety disorders: Secondary | ICD-10-CM | POA: Diagnosis not present

## 2022-09-04 DIAGNOSIS — I10 Essential (primary) hypertension: Secondary | ICD-10-CM | POA: Diagnosis not present

## 2022-09-04 DIAGNOSIS — Z0000 Encounter for general adult medical examination without abnormal findings: Secondary | ICD-10-CM | POA: Diagnosis not present

## 2022-09-04 DIAGNOSIS — K219 Gastro-esophageal reflux disease without esophagitis: Secondary | ICD-10-CM

## 2022-09-04 DIAGNOSIS — E785 Hyperlipidemia, unspecified: Secondary | ICD-10-CM

## 2022-09-04 DIAGNOSIS — L2389 Allergic contact dermatitis due to other agents: Secondary | ICD-10-CM | POA: Diagnosis not present

## 2022-09-04 DIAGNOSIS — E2839 Other primary ovarian failure: Secondary | ICD-10-CM | POA: Diagnosis not present

## 2022-09-04 DIAGNOSIS — Z1211 Encounter for screening for malignant neoplasm of colon: Secondary | ICD-10-CM

## 2022-09-04 MED ORDER — HYDROXYZINE PAMOATE 50 MG PO CAPS
ORAL_CAPSULE | ORAL | 2 refills | Status: DC
  Filled 2022-09-04: qty 180, 30d supply, fill #0
  Filled 2022-09-28 – 2022-10-12 (×2): qty 180, 30d supply, fill #1
  Filled 2022-11-09: qty 180, 30d supply, fill #2

## 2022-09-04 MED ORDER — EZETIMIBE 10 MG PO TABS
10.0000 mg | ORAL_TABLET | Freq: Every day | ORAL | 1 refills | Status: DC
  Filled 2022-09-04 – 2022-09-07 (×2): qty 90, 90d supply, fill #0
  Filled 2022-12-08: qty 90, 90d supply, fill #1

## 2022-09-04 MED ORDER — PANTOPRAZOLE SODIUM 40 MG PO TBEC
40.0000 mg | DELAYED_RELEASE_TABLET | Freq: Every day | ORAL | 3 refills | Status: DC
  Filled 2022-09-04: qty 90, fill #0
  Filled 2022-11-09: qty 90, 90d supply, fill #0
  Filled 2022-12-08 – 2023-01-18 (×2): qty 90, 90d supply, fill #1
  Filled 2023-04-16 – 2023-04-25 (×2): qty 90, 90d supply, fill #2
  Filled 2023-07-27 – 2023-07-30 (×2): qty 90, 90d supply, fill #3

## 2022-09-04 MED ORDER — CELECOXIB 100 MG PO CAPS
100.0000 mg | ORAL_CAPSULE | Freq: Two times a day (BID) | ORAL | 3 refills | Status: DC
Start: 2022-09-04 — End: 2023-04-16
  Filled 2022-09-04: qty 60, 30d supply, fill #0
  Filled 2022-09-28 – 2022-10-12 (×2): qty 60, 30d supply, fill #1
  Filled 2022-11-09: qty 60, 30d supply, fill #2
  Filled 2023-02-15: qty 60, 30d supply, fill #3

## 2022-09-04 MED ORDER — TRIAMCINOLONE ACETONIDE 0.1 % EX CREA
1.0000 | TOPICAL_CREAM | Freq: Two times a day (BID) | CUTANEOUS | 0 refills | Status: DC
  Filled 2022-09-04: qty 30, 15d supply, fill #0

## 2022-09-04 NOTE — Assessment & Plan Note (Signed)
ghm utd °Check labs s °See avs °

## 2022-09-04 NOTE — Assessment & Plan Note (Signed)
Encourage heart healthy diet such as MIND or DASH diet, increase exercise, avoid trans fats, simple carbohydrates and processed foods, consider a krill or fish or flaxseed oil cap daily.  °

## 2022-09-04 NOTE — Progress Notes (Signed)
Subjective:   By signing my name below, I, Carylon Perches, attest that this documentation has been prepared under the direction and in the presence of Roma Schanz R DO 09/04/2022.    Patient ID: [name], female    DOB: Mar 27, 1961, 61 y.o.   MRN: [id number]  Chief Complaint  Patient presents with  . Annual Exam    Pt states fasting     HPI Patient is in today for a physical exam.  She is requesting a refill of 40 Mg Protonix, 50 Mg of Vistaril, 10 Mg of Zetia, and 100 Mg of Celebrex  She reports that she had a fall and broke her left arm on 08/12/2022. She also complains of rash on the left arm. She has been applying the hydrocortisone, but it is not resolving the rash. She's also using a wrap around around her arm which is alleviating her symptoms.  She reports that she does not see OBGYN currently.  She reports that she sees PA Allie Dimmer, for migraines.  She denies having any fever, new muscle pain, joint pain , new moles, congestion, sinus pain, sore throat, chest pain, palpations, cough, SOB, wheezing,n/v/d constipation, blood in stool, dysuria, frequency, hematuria, at this time.  She denies of any changes in family history.  She last completed her colonoscopy in 12/11/2008.  She completed her DEXA on 04/26/2021. She last completed pap smear on 12/11/2008. She last completed her mammogram on 06/01/2021. She is not interested the influenza or Shingles vaccine during today's visit. She is UTD for regular dental exams.  She is UTD for regular vision exams.   Past Medical History:  Diagnosis Date  . Allergy    seasonal allergies  . Anxiety    on meds  . Arthritis    on meds  . Depression    on meds  . Dizziness 03/05/2017  . Essential hypertension 03/05/2017   on meds  . GERD (gastroesophageal reflux disease)    on meds  . Hyperlipidemia 03/05/2017   on meds  . Infertility, female   . Migraines   . Osteopenia     Past Surgical History:   Procedure Laterality Date  . ANKLE SURGERY Left 1979  . COLONOSCOPY  2011   MS-F/V-moviprep(exc)-normal -10 yr recall  . SHOULDER SURGERY Left 1990    Family History  Problem Relation Age of Onset  . Hypertension Mother   . Allergic rhinitis Mother   . Sleep apnea Mother   . Hyperlipidemia Mother   . Cancer Maternal Grandmother   . Stroke Other   . Eczema Daughter   . Alcohol abuse Father   . Stroke Father   . Heart disease Maternal Grandfather   . Alcohol abuse Paternal Grandfather   . Colon polyps Neg Hx   . Colon cancer Neg Hx   . Esophageal cancer Neg Hx   . Rectal cancer Neg Hx   . Stomach cancer Neg Hx     Social History   Socioeconomic History  . Marital status: Legally Separated    Spouse name: Not on file  . Number of children: Not on file  . Years of education: Not on file  . Highest education level: Not on file  Occupational History    Employer: [employer]    Comment: [employer]  Tobacco Use  . Smoking status: Former    Types: Cigarettes    Quit date: 02/09/1992    Years since quitting: 30.5  . Smokeless tobacco:  Never  Vaping Use  . Vaping Use: Never used  Substance and Sexual Activity  . Alcohol use: Yes    Alcohol/week: 12.0 standard drinks of alcohol    Types: 12 Standard drinks or equivalent per week  . Drug use: No  . Sexual activity: Yes    Partners: Male  Other Topics Concern  . Not on file  Social History Narrative  . Not on file   Social Determinants of Health   Financial Resource Strain: Not on file  Food Insecurity: Not on file  Transportation Needs: Not on file  Physical Activity: Not on file  Stress: Not on file  Social Connections: Not on file  Intimate Partner Violence: Not on file    Outpatient Medications Prior to Visit  Medication Sig Dispense Refill  . amLODipine (NORVASC) 5 MG tablet Take 1 tablet (5 mg total) by mouth daily. 90 tablet 3  . Ascorbic Acid (VITAMIN C PO) Take by mouth.    Marland Kitchen aspirin  EC 81 MG tablet Take 81 mg by mouth daily.    . baclofen (LIORESAL) 10 MG tablet TAKE 1 TABLET BY MOUTH EVERY 8 HOURS AS NEEDED 60 tablet 4  . BOTOX 100 units SOLR injection INJECT 100 UNITS INTO THE MUSCLE EVERY 3 MONTHS. 1 each 3  . Butalbital-APAP-Caffeine (FIORICET) 50-300-40 MG CAPS Take 1-2 capsules by mouth every 8 hours if needed 30 capsule 1  . Calcium Carbonate (CALCIUM 600 PO) Take by mouth.    Marland Kitchen COVID-19 At Home Antigen Test [facility] COVID-19 HOME TEST) KIT Use as directed within package instructions 4 each 0  . fluticasone (FLONASE) 50 MCG/ACT nasal spray Place 2 sprays into both nostrils daily. 16 g 5  . Fluticasone Propionate (FLONASE NA) Place into the nose.    . Galcanezumab-gnlm (EMGALITY) 120 MG/ML SOAJ INJECT 1 PEN EVERY 30 DAYS 1 mL 11  . ibuprofen (ADVIL) 800 MG tablet Take 1 tablet (800 mg total) by mouth every 8 (eight) hours as needed. 60 tablet 3  . magnesium oxide (MAG-OX) 400 MG tablet Take 400 mg by mouth daily.    . Multiple Vitamins-Minerals (ZINC PO) Take by mouth.    . naratriptan (AMERGE) 2.5 MG tablet TAKE 1 TABLET BY MOUTH AT ONSET OF HEADACHE. IF HA RETURNS OR DOES NOT RESOLVE MAY REPEAT AFTER 4 HOURS. DO NOT EXCEED 2 TABLETS IN 24 HOURS 10 tablet 11  . NONFORMULARY OR COMPOUNDED ITEM Gaba Calm as needed for anxiety    . nystatin (MYCOSTATIN) 100000 UNIT/ML suspension Take 5 mLs by mouth four times daily. 60 mL 0  . ondansetron (ZOFRAN) 8 MG tablet TAKE 1 TABLET BY MOUTH EVERY 8 HOURS AS NEEDED 30 tablet 6  . oxyCODONE-acetaminophen (PERCOCET/ROXICET) 5-325 MG tablet Take 1 - 2 tablets by mouth every 6 hours as needed for severe pain. 10 tablet 0  . Probiotic Product (PROBIOTIC DAILY PO) Take by mouth.    . Pseudoephedrine HCl (SUDAFED PO) Take 1 tablet by mouth daily as needed.    . RESTASIS 0.05 % ophthalmic emulsion Place 1 drop into both eyes 2 times daily. 60 each 1  . Rimegepant Sulfate (NURTEC) 75 MG TBDP Take 75 mg by mouth every other day. 16 tablet  11  . rosuvastatin (CRESTOR) 40 MG tablet TAKE 1 TABLET BY MOUTH ONCE DAILY 90 tablet 3  . sertraline (ZOLOFT) 100 MG tablet Take 1 and 1/2 tablets by mouth daily. 45 tablet 3  . sulfamethoxazole-trimethoprim (BACTRIM DS) 800-160 MG tablet Take 1  tablet by mouth 2 (two) times daily. 10 tablet 0  . tiZANidine (ZANAFLEX) 4 MG tablet TAKE 1 TABLET BY MOUTH 3 TIMES DAILY 90 tablet 2  . topiramate (TOPAMAX) 200 MG tablet TAKE 1 TABLET BY MOUTH ONCE DAILY 30 tablet 11  . traZODone (DESYREL) 50 MG tablet TAKE 1/2 TO 1 TABLET BY MOUTH AT BEDTIME AS NEEDED FOR SLEEP 30 tablet 0  . TYRVAYA 0.03 MG/ACT SOLN Use 1 spray in each nostril 2 times every day approximately 12 hours apart 8.4 mL 11  . TYRVAYA 0.03 MG/ACT SOLN Place 1 spray nasally 2 times every day in each nostril approximately 12 hours apart 8.4 mL 11  . UNABLE TO FIND CBD 50 mg    . vitamin B-12 (CYANOCOBALAMIN) 500 MCG tablet Take 500 mcg by mouth daily.    Marland Kitchen VITAMIN D PO Take by mouth.    . celecoxib (CELEBREX) 100 MG capsule Take 1 capsule (100 mg total) by mouth 2 (two) times daily. 60 capsule 3  . ezetimibe (ZETIA) 10 MG tablet Take 1 tablet by mouth daily. 30 tablet 2  . hydrOXYzine (VISTARIL) 50 MG capsule TAKE 2 CAPSULES BY MOUTH 3 TIMES DAILY AS NEEDED FOR ITCHING 180 capsule 2  . pantoprazole (PROTONIX) 40 MG tablet TAKE 1 TABLET BY MOUTH ONCE DAILY 90 tablet 3   No facility-administered medications prior to visit.    Allergies  Allergen Reactions  . Augmentin [Amoxicillin-Pot Clavulanate]     diarrhea    Review of Systems  Constitutional:  Negative for fever.  HENT:  Negative for congestion, sinus pain and sore throat.   Respiratory:  Negative for cough, shortness of breath and wheezing.   Cardiovascular:  Negative for chest pain and palpitations.  Gastrointestinal:  Negative for blood in stool, constipation, diarrhea, nausea and vomiting.  Genitourinary:  Negative for dysuria, frequency and hematuria.  Musculoskeletal:   Negative for joint pain and myalgias.  Skin:  Positive for rash (Left Arm).       (-) New Moles       Objective:    Physical Exam Vitals and nursing note reviewed.  Constitutional:      General: She is not in acute distress.    Appearance: Normal appearance. She is not ill-appearing.  HENT:     Head: Normocephalic and atraumatic.     Right Ear: Tympanic membrane, ear canal and external ear normal.     Left Ear: Tympanic membrane, ear canal and external ear normal.  Eyes:     Extraocular Movements: Extraocular movements intact.     Pupils: Pupils are equal, round, and reactive to light.  Cardiovascular:     Rate and Rhythm: Normal rate and regular rhythm.     Heart sounds: Normal heart sounds. No murmur heard.    No gallop.  Pulmonary:     Effort: Pulmonary effort is normal. No respiratory distress.     Breath sounds: Normal breath sounds. No wheezing or rales.  Abdominal:     General: Bowel sounds are normal. There is no distension.     Palpations: Abdomen is soft.     Tenderness: There is no abdominal tenderness. There is no guarding.  Skin:    General: Skin is warm and dry.  Neurological:     Mental Status: She is alert and oriented to person, place, and time.  Psychiatric:        Judgment: Judgment normal.    BP 114/80 (BP Location: Right Arm, Patient Position: Sitting, Cuff Size:  Normal)   Pulse (!) 56   Temp 97.9 F (36.6 C) (Oral)   Resp 18   Ht 5' 4" (1.626 m)   Wt 172 lb 6.4 oz (78.2 kg)   SpO2 98%   BMI 29.59 kg/m  Wt Readings from Last 3 Encounters:  09/04/22 172 lb 6.4 oz (78.2 kg)  08/24/22 171 lb (77.6 kg)  08/16/22 171 lb 9.6 oz (77.8 kg)    Diabetic Foot Exam - Simple   No data filed    Lab Results  Component Value Date   WBC 6.8 09/02/2021   HGB 12.7 09/02/2021   HCT 37.9 09/02/2021   PLT 277.0 09/02/2021   GLUCOSE 92 09/02/2021   CHOL 222 (H) 09/02/2021   TRIG 292.0 (H) 09/02/2021   HDL 53.90 09/02/2021   LDLDIRECT 137.0 09/02/2021    LDLCALC 53 02/25/2021   ALT 39 (H) 09/02/2021   AST 32 09/02/2021   NA 139 09/02/2021   K 3.5 09/02/2021   CL 106 09/02/2021   CREATININE 0.82 09/02/2021   BUN 17 09/02/2021   CO2 24 09/02/2021   TSH 1.84 09/02/2021   HGBA1C 5.5 04/16/2015    Lab Results  Component Value Date   TSH 1.84 09/02/2021   Lab Results  Component Value Date   WBC 6.8 09/02/2021   HGB 12.7 09/02/2021   HCT 37.9 09/02/2021   MCV 97.1 09/02/2021   PLT 277.0 09/02/2021   Lab Results  Component Value Date   NA 139 09/02/2021   K 3.5 09/02/2021   CO2 24 09/02/2021   GLUCOSE 92 09/02/2021   BUN 17 09/02/2021   CREATININE 0.82 09/02/2021   BILITOT 0.4 09/02/2021   ALKPHOS 61 09/02/2021   AST 32 09/02/2021   ALT 39 (H) 09/02/2021   PROT 6.4 09/02/2021   ALBUMIN 4.1 09/02/2021   CALCIUM 9.3 09/02/2021   GFR 76.75 09/02/2021   Lab Results  Component Value Date   CHOL 222 (H) 09/02/2021   Lab Results  Component Value Date   HDL 53.90 09/02/2021   Lab Results  Component Value Date   LDLCALC 53 02/25/2021   Lab Results  Component Value Date   TRIG 292.0 (H) 09/02/2021   Lab Results  Component Value Date   CHOLHDL 4 09/02/2021   Lab Results  Component Value Date   HGBA1C 5.5 04/16/2015       Assessment & Plan:   Problem List Items Addressed This Visit       Unprioritized   Depression with anxiety   Relevant Medications   hydrOXYzine (VISTARIL) 50 MG capsule   Preventative health care - Primary    ghm utd Check labs s See avs      Relevant Orders   CBC with Differential/Platelet   Comprehensive metabolic panel   Lipid panel   TSH   TSH   Hyperlipidemia    Encourage heart healthy diet such as MIND or DASH diet, increase exercise, avoid trans fats, simple carbohydrates and processed foods, consider a krill or fish or flaxseed oil cap daily.       Relevant Medications   ezetimibe (ZETIA) 10 MG tablet   Other Relevant Orders   CBC with Differential/Platelet    Comprehensive metabolic panel   Lipid panel   TSH   Essential hypertension    Well controlled, no changes to meds. Encouraged heart healthy diet such as the DASH diet and exercise as tolerated.       Relevant Medications   ezetimibe (ZETIA)  10 MG tablet   Other Visit Diagnoses     Gastroesophageal reflux disease       Relevant Medications   pantoprazole (PROTONIX) 40 MG tablet   Allergic contact dermatitis due to other agents       Relevant Medications   triamcinolone cream (KENALOG) 0.1 %   Colon cancer screening       Relevant Orders   Ambulatory referral to Gastroenterology   Estrogen deficiency       Relevant Orders   DG Bone Density        Meds ordered this encounter  Medications  . celecoxib (CELEBREX) 100 MG capsule    Sig: Take 1 capsule (100 mg total) by mouth 2 (two) times daily.    Dispense:  60 capsule    Refill:  3  . ezetimibe (ZETIA) 10 MG tablet    Sig: Take 1 tablet by mouth daily.    Dispense:  90 tablet    Refill:  1  . hydrOXYzine (VISTARIL) 50 MG capsule    Sig: TAKE 2 CAPSULES BY MOUTH 3 TIMES DAILY AS NEEDED FOR ITCHING    Dispense:  180 capsule    Refill:  2  . pantoprazole (PROTONIX) 40 MG tablet    Sig: TAKE 1 TABLET BY MOUTH ONCE DAILY    Dispense:  90 tablet    Refill:  3  . triamcinolone cream (KENALOG) 0.1 %    Sig: Apply 1 Application topically 2 (two) times daily.    Dispense:  30 g    Refill:  0    I, Ann Held, DO, personally preformed the services described in this documentation.  All medical record entries made by the scribe were at my direction and in my presence.  I have reviewed the chart and discharge instructions (if applicable) and agree that the record reflects my personal performance and is accurate and complete. 09/04/2022   I,Amber Collins,acting as a scribe for Ann Held, DO.,have documented all relevant documentation on the behalf of Ann Held, DO,as directed by  Ann Held, DO while in the presence of Ann Held, DO.    Ann Held, DO

## 2022-09-04 NOTE — Assessment & Plan Note (Signed)
Well controlled, no changes to meds. Encouraged heart healthy diet such as the DASH diet and exercise as tolerated.  °

## 2022-09-05 ENCOUNTER — Other Ambulatory Visit (HOSPITAL_COMMUNITY): Payer: Self-pay

## 2022-09-05 LAB — LIPID PANEL
Cholesterol: 262 mg/dL — ABNORMAL HIGH (ref 0–200)
HDL: 62 mg/dL (ref >39.00)
NonHDL: 199.74
Total CHOL/HDL Ratio: 4
Triglycerides: 202 mg/dL — ABNORMAL HIGH (ref 0.0–149.0)
VLDL: 40.4 mg/dL — ABNORMAL HIGH (ref 0.0–40.0)

## 2022-09-05 LAB — CBC WITH DIFFERENTIAL/PLATELET
Basophils Absolute: 0.1 10*3/uL (ref 0.0–0.1)
Basophils Relative: 0.9 % (ref 0.0–3.0)
Eosinophils Absolute: 0.2 10*3/uL (ref 0.0–0.7)
Eosinophils Relative: 2.6 % (ref 0.0–5.0)
HCT: 36.9 % (ref 36.0–46.0)
Hemoglobin: 12.3 g/dL (ref 12.0–15.0)
Lymphocytes Relative: 31.2 % (ref 12.0–46.0)
Lymphs Abs: 1.8 10*3/uL (ref 0.7–4.0)
MCHC: 33.4 g/dL (ref 30.0–36.0)
MCV: 95.3 fl (ref 78.0–100.0)
Monocytes Absolute: 0.4 10*3/uL (ref 0.1–1.0)
Monocytes Relative: 7.3 % (ref 3.0–12.0)
Neutro Abs: 3.4 10*3/uL (ref 1.4–7.7)
Neutrophils Relative %: 58 % (ref 43.0–77.0)
Platelets: 302 10*3/uL (ref 150.0–400.0)
RBC: 3.88 Mil/uL (ref 3.87–5.11)
RDW: 13.2 % (ref 11.5–15.5)
WBC: 5.9 10*3/uL (ref 4.0–10.5)

## 2022-09-05 LAB — COMPREHENSIVE METABOLIC PANEL
ALT: 19 U/L (ref 0–35)
AST: 16 U/L (ref 0–37)
Albumin: 4.3 g/dL (ref 3.5–5.2)
Alkaline Phosphatase: 79 U/L (ref 39–117)
BUN: 23 mg/dL (ref 6–23)
CO2: 24 mEq/L (ref 19–32)
Calcium: 9.2 mg/dL (ref 8.4–10.5)
Chloride: 107 mEq/L (ref 96–112)
Creatinine, Ser: 0.89 mg/dL (ref 0.40–1.20)
GFR: 69.07 mL/min (ref >60.00)
Glucose, Bld: 106 mg/dL — ABNORMAL HIGH (ref 70–99)
Potassium: 4 mEq/L (ref 3.5–5.1)
Sodium: 139 mEq/L (ref 135–145)
Total Bilirubin: 0.6 mg/dL (ref 0.2–1.2)
Total Protein: 6.7 g/dL (ref 6.0–8.3)

## 2022-09-05 LAB — LDL CHOLESTEROL, DIRECT: Direct LDL: 177 mg/dL

## 2022-09-05 LAB — TSH: TSH: 2.17 u[IU]/mL (ref 0.35–5.50)

## 2022-09-06 ENCOUNTER — Other Ambulatory Visit (HOSPITAL_COMMUNITY): Payer: Self-pay

## 2022-09-07 ENCOUNTER — Other Ambulatory Visit: Payer: Self-pay | Admitting: Family Medicine

## 2022-09-07 ENCOUNTER — Ambulatory Visit (INDEPENDENT_AMBULATORY_CARE_PROVIDER_SITE_OTHER): Payer: 59

## 2022-09-07 ENCOUNTER — Other Ambulatory Visit (HOSPITAL_COMMUNITY): Payer: Self-pay

## 2022-09-07 ENCOUNTER — Ambulatory Visit (INDEPENDENT_AMBULATORY_CARE_PROVIDER_SITE_OTHER): Payer: 59 | Admitting: Family Medicine

## 2022-09-07 VITALS — BP 114/82 | HR 105 | Ht 64.0 in

## 2022-09-07 DIAGNOSIS — S52592A Other fractures of lower end of left radius, initial encounter for closed fracture: Secondary | ICD-10-CM

## 2022-09-07 DIAGNOSIS — E785 Hyperlipidemia, unspecified: Secondary | ICD-10-CM

## 2022-09-07 DIAGNOSIS — M25532 Pain in left wrist: Secondary | ICD-10-CM | POA: Diagnosis not present

## 2022-09-07 NOTE — Patient Instructions (Signed)
Thank you for coming in today.   Recheck in 3 weeks.

## 2022-09-07 NOTE — Progress Notes (Signed)
   I, Peterson Lombard, LAT, ATC acting as a scribe for Lynne Leader, MD.  [name] is a 61 y.o. female who presents to [place] [facility] today for f/u fx of the L distal radius. Pt was at a concert on 9/2, slipped on the wet grass, and fell on her L wrist. Pt was seen at the [facility] on 9/3. 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 Pt was last seen by Dr. Georgina Snell on 08/24/22 and was transitioned into a short arm Exos cast and anticipate return to work 9/21. Pt contacted the office on 9/15 and her oxycodone was refilled. Today, pt reports today is the 1st day she feels her wrist is starting to improve and pain is not as severe.  Dx imaging: 08/24/22 L wrist XR 08/13/22 L wrist XR  Pertinent review of systems: No fevers or chills  Relevant historical information: Hypertension   Exam:  BP 114/82   Pulse (!) 105   Ht 5\' 4"  (1.626 m)   SpO2 97%   BMI 29.59 kg/m  General: Well Developed, well nourished, and in no acute distress.   MSK: Left wrist: Slight micropapular rash along the volar and dorsal left wrist. Mild swelling left wrist.  Decreased range of motion wrist.  Mildly tender palpation distal radius dorsally.  Grip strength pulses cap refill and sensation are intact distally..    Lab and Radiology Results  X-ray images left wrist obtained today personally and independently interpreted. Fracture line still visible at distal radius with minimal or no callus formation present.  No significant change in angulation or displacement. Await formal radiology review     Assessment and Plan: 61 y.o. female with left distal radius fracture.  Fracture is about 79 month old.  Continue immobilization with Exos cast or short arm splint.  Check back in 3 weeks.   PDMP not reviewed this encounter. Orders Placed This Encounter  Procedures   DG Wrist Complete Left    Standing Status:   Future    Number of Occurrences:   1    Standing  Expiration Date:   10/07/2022    Order Specific Question:   Reason for Exam (SYMPTOM  OR DIAGNOSIS REQUIRED)    Answer:   left wrist pain    Order Specific Question:   Preferred imaging location?    Answer:   [facility]   No orders of the defined types were placed in this encounter.    Discussed warning signs or symptoms. Please see discharge instructions. Patient expresses understanding.   The above documentation has been reviewed and is accurate and complete Lynne Leader, M.D.

## 2022-09-11 NOTE — Progress Notes (Signed)
Fracture is healing.

## 2022-09-12 ENCOUNTER — Other Ambulatory Visit (HOSPITAL_COMMUNITY): Payer: Self-pay

## 2022-09-13 ENCOUNTER — Other Ambulatory Visit (HOSPITAL_COMMUNITY): Payer: Self-pay

## 2022-09-14 ENCOUNTER — Other Ambulatory Visit (HOSPITAL_COMMUNITY): Payer: Self-pay

## 2022-09-15 ENCOUNTER — Other Ambulatory Visit (HOSPITAL_COMMUNITY): Payer: Self-pay

## 2022-09-19 ENCOUNTER — Other Ambulatory Visit (HOSPITAL_COMMUNITY): Payer: Self-pay

## 2022-09-20 ENCOUNTER — Other Ambulatory Visit (HOSPITAL_COMMUNITY): Payer: Self-pay

## 2022-09-21 ENCOUNTER — Telehealth (HOSPITAL_BASED_OUTPATIENT_CLINIC_OR_DEPARTMENT_OTHER): Payer: Self-pay

## 2022-09-27 ENCOUNTER — Ambulatory Visit (INDEPENDENT_AMBULATORY_CARE_PROVIDER_SITE_OTHER): Payer: 59

## 2022-09-27 ENCOUNTER — Ambulatory Visit (INDEPENDENT_AMBULATORY_CARE_PROVIDER_SITE_OTHER): Payer: 59 | Admitting: Family Medicine

## 2022-09-27 VITALS — BP 130/80 | HR 70 | Ht 64.0 in | Wt 171.0 lb

## 2022-09-27 DIAGNOSIS — S52592A Other fractures of lower end of left radius, initial encounter for closed fracture: Secondary | ICD-10-CM | POA: Diagnosis not present

## 2022-09-27 DIAGNOSIS — M25532 Pain in left wrist: Secondary | ICD-10-CM | POA: Diagnosis not present

## 2022-09-27 NOTE — Patient Instructions (Signed)
Thank you for coming in today.   Recheck in 3 weeks.   Use the brace.

## 2022-09-27 NOTE — Progress Notes (Unsigned)
   I, Peterson Lombard, LAT, ATC acting as a scribe for Lynne Leader, MD.  [name] is a 61 y.o. female who presents to [place] at [facility] today for f/u fx of the L distal radius. Pt was at a concert on 9/2, slipped on the wet grass, and fell on her L wrist. Pt was seen at the [facility] on 9/3. 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 Pt was last seen by Dr. Georgina Snell on 09/07/22 and was advised to cont immobilization w/ Exos cast or short arm splint. Today, pt reports she isn't having to wear her splint all the time, but likes the compression of it. Pt feels like fx is improving and is having less pain.  Dx imaging: 09/07/22 L wrist XR 08/24/22 L wrist XR 08/13/22 L wrist XR  Pertinent review of systems: No fevers or chills  Relevant historical information: Hypertension   Exam:  BP 130/80   Pulse 70   Ht 5\' 4"  (1.626 m)   Wt 171 lb (77.6 kg)   SpO2 99%   BMI 29.35 kg/m  General: Well Developed, well nourished, and in no acute distress.   MSK: Left wrist normal appearing decreased range of motion nontender.    Lab and Radiology Results  X-ray images left wrist obtained today personally and independently interpreted. Fracture line is still present through the distal radius involving the joint.  Minimal displacement is present.  No significant change in alignment from prior x-ray Await formal radiology review    Assessment and Plan: 61 y.o. female with left wrist fracture.  Medically improving.  X-ray still has some healing to go.  Continue wrist brace.  Recheck in 3 weeks.   PDMP not reviewed this encounter. Orders Placed This Encounter  Procedures   DG Wrist Complete Left    Standing Status:   Future    Number of Occurrences:   1    Standing Expiration Date:   10/28/2022    Order Specific Question:   Reason for Exam (SYMPTOM  OR DIAGNOSIS REQUIRED)    Answer:   left wrist pain    Order Specific Question:   Preferred imaging  location?    Answer:   [facility]   No orders of the defined types were placed in this encounter.    Discussed warning signs or symptoms. Please see discharge instructions. Patient expresses understanding.   The above documentation has been reviewed and is accurate and complete Lynne Leader, M.D.

## 2022-09-28 ENCOUNTER — Ambulatory Visit (INDEPENDENT_AMBULATORY_CARE_PROVIDER_SITE_OTHER): Payer: 59

## 2022-09-28 ENCOUNTER — Other Ambulatory Visit: Payer: Self-pay | Admitting: Family Medicine

## 2022-09-28 ENCOUNTER — Ambulatory Visit: Payer: 59 | Admitting: Family Medicine

## 2022-09-28 ENCOUNTER — Other Ambulatory Visit (HOSPITAL_COMMUNITY): Payer: Self-pay

## 2022-09-28 DIAGNOSIS — Z1231 Encounter for screening mammogram for malignant neoplasm of breast: Secondary | ICD-10-CM

## 2022-10-02 NOTE — Progress Notes (Signed)
Left wrist x-ray shows healing fracture.  It is important to keep the fracture still.

## 2022-10-07 ENCOUNTER — Other Ambulatory Visit (HOSPITAL_COMMUNITY): Payer: Self-pay

## 2022-10-12 ENCOUNTER — Other Ambulatory Visit: Payer: Self-pay | Admitting: Physician Assistant

## 2022-10-12 ENCOUNTER — Other Ambulatory Visit (HOSPITAL_COMMUNITY): Payer: Self-pay

## 2022-10-12 DIAGNOSIS — G43011 Migraine without aura, intractable, with status migrainosus: Secondary | ICD-10-CM

## 2022-10-13 ENCOUNTER — Ambulatory Visit (INDEPENDENT_AMBULATORY_CARE_PROVIDER_SITE_OTHER): Payer: 59 | Admitting: Physician Assistant

## 2022-10-13 ENCOUNTER — Other Ambulatory Visit (HOSPITAL_COMMUNITY): Payer: Self-pay

## 2022-10-13 ENCOUNTER — Encounter: Payer: Self-pay | Admitting: Physician Assistant

## 2022-10-13 VITALS — BP 128/83 | HR 75 | Wt 175.0 lb

## 2022-10-13 DIAGNOSIS — G43009 Migraine without aura, not intractable, without status migrainosus: Secondary | ICD-10-CM

## 2022-10-13 DIAGNOSIS — M542 Cervicalgia: Secondary | ICD-10-CM

## 2022-10-13 DIAGNOSIS — G43709 Chronic migraine without aura, not intractable, without status migrainosus: Secondary | ICD-10-CM

## 2022-10-13 DIAGNOSIS — M62838 Other muscle spasm: Secondary | ICD-10-CM

## 2022-10-13 MED ORDER — NURTEC 75 MG PO TBDP
1.0000 | ORAL_TABLET | ORAL | 11 refills | Status: DC
  Filled 2022-10-13: qty 16, 32d supply, fill #0
  Filled 2022-11-09: qty 15, 30d supply, fill #0
  Filled 2023-01-18: qty 15, 30d supply, fill #1
  Filled 2023-04-16 – 2023-04-25 (×2): qty 15, 30d supply, fill #2
  Filled 2023-06-08: qty 15, 30d supply, fill #3
  Filled 2023-07-27 – 2023-07-30 (×2): qty 15, 30d supply, fill #4
  Filled 2023-08-24 – 2023-08-27 (×2): qty 15, 30d supply, fill #5
  Filled 2023-10-10: qty 15, 30d supply, fill #6

## 2022-10-13 MED ORDER — BUTALBITAL-APAP-CAFFEINE 50-300-40 MG PO CAPS
ORAL_CAPSULE | ORAL | 1 refills | Status: DC
  Filled 2022-10-13: qty 30, 5d supply, fill #0
  Filled 2022-11-09: qty 30, 5d supply, fill #1

## 2022-10-13 MED ORDER — TIZANIDINE HCL 4 MG PO TABS
ORAL_TABLET | Freq: Three times a day (TID) | ORAL | 2 refills | Status: DC
  Filled 2022-10-13: qty 90, 30d supply, fill #0
  Filled 2022-12-08: qty 90, 30d supply, fill #1
  Filled 2023-01-18: qty 90, 30d supply, fill #2

## 2022-10-13 MED ORDER — NARATRIPTAN HCL 2.5 MG PO TABS
ORAL_TABLET | ORAL | 11 refills | Status: DC
  Filled 2022-10-13: qty 10, 30d supply, fill #0
  Filled 2022-11-09: qty 10, 30d supply, fill #1
  Filled 2023-01-18: qty 10, 30d supply, fill #2
  Filled 2023-04-16: qty 9, 30d supply, fill #3
  Filled 2023-04-25: qty 9, 27d supply, fill #3
  Filled 2023-06-08: qty 9, 27d supply, fill #4
  Filled 2023-08-24 – 2023-08-27 (×2): qty 9, 27d supply, fill #5
  Filled 2023-09-18: qty 9, 27d supply, fill #6
  Filled 2023-10-10: qty 9, 27d supply, fill #7

## 2022-10-13 MED ORDER — BACLOFEN 10 MG PO TABS
ORAL_TABLET | Freq: Three times a day (TID) | ORAL | 4 refills | Status: DC | PRN
  Filled 2022-10-13: qty 60, 20d supply, fill #0
  Filled 2022-12-08 (×2): qty 60, 20d supply, fill #1
  Filled 2023-01-18: qty 60, 20d supply, fill #2
  Filled 2023-04-16: qty 60, 20d supply, fill #3

## 2022-10-13 MED ORDER — ONABOTULINUMTOXINA 100 UNITS IJ SOLR
100.0000 [IU] | Freq: Once | INTRAMUSCULAR | Status: AC
  Administered 2022-10-13: 100 [IU] via INTRAMUSCULAR

## 2022-10-13 MED ORDER — BOTOX 100 UNITS IJ SOLR
INTRAMUSCULAR | 3 refills | Status: DC
  Filled 2022-10-13: qty 1, fill #0
  Filled 2022-12-05: qty 1, 90d supply, fill #0
  Filled 2023-03-26: qty 1, 90d supply, fill #1
  Filled 2023-06-22 – 2023-06-28 (×2): qty 1, 90d supply, fill #2
  Filled 2023-09-27: qty 1, 90d supply, fill #3

## 2022-10-13 NOTE — Progress Notes (Signed)
Patient here for Botox injection.  Pt states she has had headaches all week.  S: Pt in office today for Botox injections. Her Botox has been working very well for migraine prevention. She resumes more frequent HAs as time for botox nears.   O: BP 128/83   Pulse 75   Wt 175 lb (79.4 kg)   BMI 30.04 kg/m    Botox Procedure Note Vial of Botox was : Supplied by Patient   Botox Dosing by [facility] for Chronic Migraine   Injection Sites for Migraines  Botox 100 units was injected using the dosage in the table above in the pattern shown above.  A: Migraine  Muscle spasm   P: Botox 100 units injected today.  Meds refilled as needed. Pt notes her insurance is changing as of next year and may need to adjust regimen. RTC 3 Months Botox.

## 2022-10-17 ENCOUNTER — Other Ambulatory Visit (HOSPITAL_COMMUNITY): Payer: Self-pay

## 2022-10-18 ENCOUNTER — Ambulatory Visit (INDEPENDENT_AMBULATORY_CARE_PROVIDER_SITE_OTHER): Payer: 59 | Admitting: Family Medicine

## 2022-10-18 ENCOUNTER — Ambulatory Visit (INDEPENDENT_AMBULATORY_CARE_PROVIDER_SITE_OTHER): Payer: 59

## 2022-10-18 VITALS — BP 108/72 | HR 73 | Ht 64.0 in | Wt 173.0 lb

## 2022-10-18 DIAGNOSIS — S52592A Other fractures of lower end of left radius, initial encounter for closed fracture: Secondary | ICD-10-CM

## 2022-10-18 DIAGNOSIS — S52502A Unspecified fracture of the lower end of left radius, initial encounter for closed fracture: Secondary | ICD-10-CM | POA: Diagnosis not present

## 2022-10-18 NOTE — Progress Notes (Signed)
   I, [name], LAT, ATC acting as a scribe for Alexandria Graham, MD.  [name] is a 61 y.o. female who presents to [facility] at [facility] today for f/u fx of the L distal radius. Pt was at a concert on 9/2, slipped on the wet grass, and fell on her L wrist. Pt was seen at the [facility] on 9/3. 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 Pt was last seen by Dr. Denyse Amass on 09/27/22 and was advised to continue wearing the wrist brace.  Today, patient reports L wrist is still painful and slightly swollen. Pt has cont'd to wear her wrist brace.   She is due for her CPR recertification at the end of this year.  She currently is unable to do the physical work of CPR with her wrist fracture. .  Dx imaging: 09/27/22 L wrist XR 09/07/22 L wrist XR 08/24/22 L wrist XR 08/13/22 L wrist XR  Pertinent review of systems: No fevers or chills  Relevant historical information: Hypertension   Exam:  BP 108/72   Pulse 73   Ht 5\' 4"  (1.626 m)   Wt 173 lb (78.5 kg)   SpO2 96%   BMI 29.70 kg/m  General: Well Developed, well nourished, and in no acute distress.   MSK: Left wrist swollen tender palpation dorsal distal radius.  Decreased wrist motion.    Lab and Radiology Results  X-ray images left wrist obtained today personally and independently interpreted Comminuted impacted fracture distal radius with healing.  Improved healing compared to prior x-ray October 18.  Fracture line is still present mildly. Await formal radiology review     Assessment and Plan: 61 y.o. female with left distal radius fracture.  Fracture is about 2 months old now.  Plan to continue immobilization with wrist brace.  Recheck in about 3 weeks.  Consider hand therapy on recheck if good healing is seen then.   PDMP not reviewed this encounter. Orders Placed This Encounter  Procedures   DG Wrist Complete Left    Standing Status:   Future    Number of Occurrences:   1     Standing Expiration Date:   11/17/2022    Order Specific Question:   Reason for Exam (SYMPTOM  OR DIAGNOSIS REQUIRED)    Answer:   Left wrist pain    Order Specific Question:   Preferred imaging location?    Answer:   14/07/2022   No orders of the defined types were placed in this encounter.    Discussed warning signs or symptoms. Please see discharge instructions. Patient expresses understanding.   The above documentation has been reviewed and is accurate and complete Kyra Searles, M.D.

## 2022-10-18 NOTE — Patient Instructions (Signed)
Thank you for coming in today.   Continue brace with activity.   Recheck in 3 weeks.   Let me know what you find out about CPR.

## 2022-10-19 ENCOUNTER — Other Ambulatory Visit (HOSPITAL_BASED_OUTPATIENT_CLINIC_OR_DEPARTMENT_OTHER): Payer: Self-pay | Admitting: Cardiovascular Disease

## 2022-10-19 ENCOUNTER — Other Ambulatory Visit (HOSPITAL_COMMUNITY): Payer: Self-pay

## 2022-10-19 NOTE — Telephone Encounter (Signed)
Pt seen 1 year ago by Dr. Duke Salvia. Please call pt to schedule appointment for refills. Thank you!

## 2022-10-20 ENCOUNTER — Other Ambulatory Visit (HOSPITAL_COMMUNITY): Payer: Self-pay

## 2022-10-20 ENCOUNTER — Telehealth (HOSPITAL_BASED_OUTPATIENT_CLINIC_OR_DEPARTMENT_OTHER): Payer: Self-pay | Admitting: Cardiovascular Disease

## 2022-10-20 NOTE — Telephone Encounter (Signed)
Left message for patient to call and schedule overdue follow up appointment with Dr. Duke Salvia to refill labs

## 2022-10-23 NOTE — Progress Notes (Signed)
Left wrist x-ray shows continued healing of the fracture.

## 2022-10-26 ENCOUNTER — Other Ambulatory Visit (HOSPITAL_COMMUNITY): Payer: Self-pay

## 2022-10-30 ENCOUNTER — Other Ambulatory Visit (HOSPITAL_COMMUNITY): Payer: Self-pay

## 2022-11-03 ENCOUNTER — Other Ambulatory Visit (HOSPITAL_COMMUNITY): Payer: Self-pay

## 2022-11-04 ENCOUNTER — Other Ambulatory Visit (HOSPITAL_COMMUNITY): Payer: Self-pay

## 2022-11-08 ENCOUNTER — Ambulatory Visit (INDEPENDENT_AMBULATORY_CARE_PROVIDER_SITE_OTHER): Payer: 59 | Admitting: Family Medicine

## 2022-11-08 ENCOUNTER — Ambulatory Visit (INDEPENDENT_AMBULATORY_CARE_PROVIDER_SITE_OTHER): Payer: 59

## 2022-11-08 VITALS — BP 112/80 | HR 68 | Ht 64.0 in | Wt 173.0 lb

## 2022-11-08 DIAGNOSIS — M25532 Pain in left wrist: Secondary | ICD-10-CM | POA: Diagnosis not present

## 2022-11-08 DIAGNOSIS — S52592A Other fractures of lower end of left radius, initial encounter for closed fracture: Secondary | ICD-10-CM | POA: Diagnosis not present

## 2022-11-08 DIAGNOSIS — M25632 Stiffness of left wrist, not elsewhere classified: Secondary | ICD-10-CM | POA: Diagnosis not present

## 2022-11-08 NOTE — Progress Notes (Signed)
    [name] is a 61 y.o. female who presents to [facility] at [facility] today for f/u fx of the L distal radius. Patient was last seen by Dr. Denyse Amass on 10/18/22 stating  L wrist is still painful and slightly swollen. Pt has cont'd to wear her wrist brace. today patient reports the wrist is definitely better, states she can tell it is healing but is still unable to bear weight. Does not hurt too much and less she over uses it (Thanksgiving for instance).   Dx imaging: 10/18/22 L wrist XR 09/27/22 L wrist XR 09/07/22 L wrist XR 08/24/22 L wrist XR 08/13/22 L wrist XR  Pertinent review of systems: No fevers or chills  Relevant historical information: Hypertension   Exam:  BP 112/80   Pulse 68   Ht 5\' 4"  (1.626 m)   Wt 173 lb (78.5 kg)   SpO2 98%   BMI 29.70 kg/m  General: Well Developed, well nourished, and in no acute distress.   MSK: Left wrist slightly swollen.  Decreased range of motion limited extension and flexion.  Intact grip strength.    Lab and Radiology Results  X-ray images left wrist obtained today personally independently interpreted.   Healed fracture distal radius. Await formal radiology review    Assessment and Plan: 61 y.o. female with left distal radius fracture now healed per my read.  Radiology overread is still pending.  She has some residual stiffness and soreness.  She is a good candidate for occupational therapy trial.  Plan refer to OT.  Check back as needed.   PDMP not reviewed this encounter. Orders Placed This Encounter  Procedures   DG Wrist Complete Left    Standing Status:   Future    Number of Occurrences:   1    Standing Expiration Date:   11/09/2023    Order Specific Question:   Reason for Exam (SYMPTOM  OR DIAGNOSIS REQUIRED)    Answer:   left wrist pain    Order Specific Question:   Preferred imaging location?    Answer:   11/11/2023   Ambulatory referral to Occupational Therapy    Referral  Priority:   Routine    Referral Type:   Occupational Therapy    Referral Reason:   Specialty Services Required    Requested Specialty:   Occupational Therapy    Number of Visits Requested:   1   No orders of the defined types were placed in this encounter.    Discussed warning signs or symptoms. Please see discharge instructions. Patient expresses understanding.   The above documentation has been reviewed and is accurate and complete Kyra Searles, M.D.

## 2022-11-08 NOTE — Patient Instructions (Signed)
Thank you for coming in today.   Plan for OT.   Recheck as needed.   Let me know if you have problem.

## 2022-11-09 ENCOUNTER — Other Ambulatory Visit (HOSPITAL_BASED_OUTPATIENT_CLINIC_OR_DEPARTMENT_OTHER): Payer: Self-pay | Admitting: Cardiovascular Disease

## 2022-11-09 ENCOUNTER — Other Ambulatory Visit (HOSPITAL_COMMUNITY): Payer: Self-pay

## 2022-11-09 MED ORDER — AMLODIPINE BESYLATE 5 MG PO TABS
5.0000 mg | ORAL_TABLET | Freq: Every day | ORAL | 0 refills | Status: DC
  Filled 2022-11-09 – 2022-12-08 (×3): qty 90, 90d supply, fill #0

## 2022-11-09 MED ORDER — ROSUVASTATIN CALCIUM 40 MG PO TABS
40.0000 mg | ORAL_TABLET | Freq: Every day | ORAL | 0 refills | Status: DC
  Filled 2022-11-09: qty 90, fill #0
  Filled 2022-12-08: qty 90, 90d supply, fill #0

## 2022-11-09 NOTE — Telephone Encounter (Signed)
Rx request sent to pharmacy.  

## 2022-11-10 ENCOUNTER — Other Ambulatory Visit (HOSPITAL_COMMUNITY): Payer: Self-pay

## 2022-11-10 NOTE — Progress Notes (Signed)
Wrist x-ray shows healing fracture.

## 2022-11-13 ENCOUNTER — Encounter: Payer: Self-pay | Admitting: *Deleted

## 2022-11-14 ENCOUNTER — Other Ambulatory Visit (HOSPITAL_COMMUNITY): Payer: Self-pay

## 2022-11-16 ENCOUNTER — Other Ambulatory Visit (HOSPITAL_COMMUNITY): Payer: Self-pay

## 2022-11-18 ENCOUNTER — Other Ambulatory Visit (HOSPITAL_COMMUNITY): Payer: Self-pay

## 2022-11-20 ENCOUNTER — Other Ambulatory Visit (HOSPITAL_COMMUNITY): Payer: Self-pay

## 2022-11-20 NOTE — Therapy (Signed)
OUTPATIENT OCCUPATIONAL THERAPY ORTHO EVALUATION  Patient Name: [name] MRN: [id number] DOB:02-16-1961, 61 y.o., female Today's Date: 11/21/2022  PCP: Roma Schanz, DO REFERRING PROVIDER:  Gregor Hams, MD    END OF SESSION:  OT End of Session - 11/21/22 1519     Visit Number 1    Number of Visits 8    Date for OT Re-Evaluation 01/05/23    Authorization Type [employer]    OT Start Time 1520    OT Stop Time 1611    OT Time Calculation (min) 51 min    Equipment Utilized During Treatment orthotic strapping    Activity Tolerance Patient tolerated treatment well;No increased pain;Patient limited by fatigue;Patient limited by pain    Behavior During Therapy [facility] for tasks assessed/performed             Past Medical History:  Diagnosis Date   Allergy    seasonal allergies   Anxiety    on meds   Arthritis    on meds   Depression    on meds   Dizziness 03/05/2017   Essential hypertension 03/05/2017   on meds   GERD (gastroesophageal reflux disease)    on meds   Hyperlipidemia 03/05/2017   on meds   Infertility, female    Migraines    Osteopenia    Past Surgical History:  Procedure Laterality Date   ANKLE SURGERY Left 1979   COLONOSCOPY  2011   MS-F/V-moviprep(exc)-normal -36 yr recall   SHOULDER SURGERY Left 1990   Patient Active Problem List   Diagnosis Date Noted   Distal radius fracture, left 08/16/2022   Depression, major, single episode, severe ([place]) 09/02/2021   Preventative health care 09/02/2021   Strain of lumbar region 07/29/2021   Chronic migraine w/o aura w/o status migrainosus, not intractable 12/24/2020   Trigger finger, acquired 12/15/2019   Chronic migraine 07/05/2018   Stress at home 11/09/2017   Essential hypertension 03/05/2017   Dizziness 03/05/2017   Hyperlipidemia 03/05/2017   Plantar fasciitis of right foot 02/02/2016   Chronic rhinitis 10/20/2015   Cough, persistent 10/20/2015   Neck pain 03/16/2015    Overweight 01/22/2015   Depression with anxiety 07/14/2014   Insomnia 05/28/2012   Migraine 05/28/2012   Muscle spasm 05/28/2012   HEADACHE 04/07/2009    ONSET DATE: DOI: 08/12/22  REFERRING DIAG:  M08.676P (ICD-10-CM) - Other closed fracture of distal end of left radius, initial encounter  M25.632 (ICD-10-CM) - Stiffness of left wrist joint    THERAPY DIAG:  Localized edema  Muscle weakness (generalized)  Pain in left wrist  Stiffness of left wrist, not elsewhere classified  Rationale for Evaluation and Treatment: Rehabilitation  SUBJECTIVE:   SUBJECTIVE STATEMENT: 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 She states pushing with Lt arm to get out of bed of to stand is painful, carrying her work laptop is painful, but she's been wearing pre-fab wrist cock-up brace. Also having difficulty cooking, opening jars, etc.    PERTINENT HISTORY: ~14 weeks post fx now. She slipped and fell at a concert on 08/15/08, breaking Lt wrist. She has had conservative tx so far. Per MD's last notes, her fx site is fairly well healed now.   PRECAUTIONS: None  WEIGHT BEARING RESTRICTIONS:  WBAT to Lt wrist   PAIN:  Are you having pain? Yes in Lt wrist Rating: 1-2/10 at rest now, up to 3/10 at  worst in past week, carrying laptop   FALLS: Has patient fallen in last 6 months? No  LIVING ENVIRONMENT: Lives with: lives alone Has following equipment at home:  none  PLOF: Independent  PATIENT GOALS: To have less pain in left wrist    OBJECTIVE: (All objective assessments below are from initial evaluation on: 11/21/22 unless otherwise specified.)    HAND DOMINANCE: Right   ADLs: Overall ADLs: States decreased ability to grab, hold household objects, pain and inability to open containers, perform FMS tasks (manipulate fasteners on clothing)   FUNCTIONAL OUTCOME MEASURES: Eval: Patient Rated Wrist Evaluation (PRWE): Pain:  10/50; Function: 11.5/50; Total Score: 21.5/100 (Higher Score  =  More Pain and/or Debility)    UPPER EXTREMITY ROM     Shoulder to Wrist AROM Left eval  Forearm supination 75  Forearm pronation  77  Wrist flexion 57 (87* opposite)   Wrist extension 53 (77* opposite)  Wrist ulnar deviation 27  Wrist radial deviation 12  Functional dart thrower's motion (F-DTM) in ulnar flexion 44  F-DTM in radial extension  67  (Blank rows = not tested)   Hand AROM Right eval Left eval  Full Fist Ability (or Gap to Distal Palmar Crease)    Thumb Opposition  (Kapandji Scale)     Thumb MCP (0-60)    Thumb IP (0-80)    Thumb Radial Abduction Span     Thumb Palmar Abduction Span     Index MCP (0-90)     Index PIP (0-100)     Index DIP (0-70)      Long MCP (0-90)      Long PIP (0-100)      Long DIP (0-70)      Ring MCP (0-90)      Ring PIP (0-100)      Ring DIP (0-70)      Little MCP (0-90)      Little PIP (0-100)      Little DIP (0-70)      (Blank rows = not tested)   UPPER EXTREMITY MMT:     MMT Left TBD  Forearm supination 4-/5  Forearm pronation 4-/5  Wrist flexion 3-/5 MMT  Wrist extension 3-/5 MMT  Wrist ulnar deviation   Wrist radial deviation   (Blank rows = not tested)  HAND FUNCTION: Eval: Observed weakness in affected hand.  Grip strength Right: 61 lbs, Left: 50 lbs   COORDINATION: Eval: Observed coordination impairments with affected hand. Box and Blocks Test: Lt 58 Blocks today (62 is WFL)  SENSATION: Eval: Light touch intact today  EDEMA:   Eval:  Mildly swollen in Lt wrist today, as well as Lt MF.   COGNITION: Eval: Overall cognitive status: WFL for evaluation today   OBSERVATIONS:   Eval: TTP slightly to distal dorsal radius, no overt DRUJ instability or ulnar sided pain.   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  (Overt Heberden's and Bouchard's nodes)   TODAY'S  TREATMENT:  Post-evaluation treatment: OT educates to limit painful range of motion weightbearing bearing, etc.  She should only wear braces when needed to support her wrist and not when at rest or doing light activities that are nonpainful.  OT fabricates a custom wrist strap today that provides compression and pressure that she states feels "good" and supports her wrist motion and forearm motion.  She should wear this as a stepdown from  her prefabricated wrist cock up as tolerated.  She was also educated on the following home exercise program that starts with light active range of motion to endpoints, with slight overpressure as tolerated, to light to medium self stretches at the the wrist.  OT demonstrates thumb and then she demonstrates some back to show knowledge.  She does well and has no added pain at the end of the session  Exercises - Turn Palm Facing Up & Down  - 4-6 x daily - 10-15 reps - Bend and Pull Back Wrist SLOWLY  - 4 x daily - 10-15 reps - "Windshield Wipers"   - 4 x daily - 10-15 reps - Wrist Flexion Stretch  - 4 x daily - 3-5 reps - 15 sec hold - Wrist Prayer Stretch  - 4 x daily - 3-5 reps - 15 sec hold - Seated Wrist Ulnar Deviation Stretch  - 3-5 x daily - 3-5 reps - 15 sec hold - Seated Wrist Radial Deviation Stretch  - 3-5 x daily - 3-5 reps - 15 hold   PATIENT EDUCATION: Education details: See tx section above for details  Person educated: Patient Education method: Verbal Instruction, Teach back, Handouts  Education comprehension: States and demonstrates understanding, Additional Education required    HOME EXERCISE PROGRAM: Access Code: [email or web address] URL: [email or web address] Date: 11/21/2022 Prepared by: Benito Mccreedy   GOALS: Goals reviewed with patient? Yes   SHORT TERM GOALS: (STG required if POC>30 days) Target Date: 12/08/22  Pt will obtain protective, custom orthotic. Goal status:  MET   2.  Pt will demo/state understanding of initial  HEP to improve pain levels and prerequisite motion. Goal status: Progressing   LONG TERM GOALS: Target Date: 01/05/23   Pt will improve functional ability by decreased impairment per PRWE assessment from 21.5/100 to 10/100 or better, for better quality of life. Goal status: INITIAL  2.  Pt will improve A/ROM in Lt wrist flex/ext from 57/53, respectively to at least 70* each, to have functional motion for tasks like reach and grasp.  Goal status: INITIAL  4.  Pt will improve strength in Lt wrist ext from 3-/5 MMT to at least 4+/5 MMT to have increased functional ability to carry out selfcare and higher-level homecare tasks with no difficulty. Goal status: INITIAL  5.  Pt will improve coordination skills in Lt arm, as seen by [facility] score on BBT testing to have increased functional ability to carry out fine motor tasks (fasteners, etc.) and more complex, coordinated IADLs (meal prep, sports, etc.).  Goal status: INITIAL  6.  Pt will decrease pain at rest from ~2/10 to 0/10 or better to have better sleep and occupational participation in daily roles. Goal status: INITIAL   ASSESSMENT:  CLINICAL IMPRESSION: Patient is a 61 y.o. female who was seen today for occupational therapy evaluation for stiff and sore, weak left wrist after fracture and decreased functional ability.  She will benefit from a course of outpatient occupational therapy to improve her functional status.   PERFORMANCE DEFICITS: in functional skills including ADLs, IADLs, coordination, edema, ROM, strength, pain, fascial restrictions, flexibility, Gross motor control, body mechanics, endurance, and UE functional use, cognitive skills including safety awareness, and psychosocial skills including coping strategies, environmental adaptation, habits, and routines and behaviors.   IMPAIRMENTS: are limiting patient from ADLs, IADLs, work, leisure, and social participation.   COMORBIDITIES: has co-morbidities such as migraines, low  back pain, neck pain anxiety and more  that affects occupational performance. Patient will benefit from  skilled OT to address above impairments and improve overall function.  MODIFICATION OR ASSISTANCE TO COMPLETE EVALUATION: No modification of tasks or assist necessary to complete an evaluation.  OT OCCUPATIONAL PROFILE AND HISTORY: Problem focused assessment: Including review of records relating to presenting problem.  CLINICAL DECISION MAKING: LOW - limited treatment options, no task modification necessary  REHAB POTENTIAL: Excellent  EVALUATION COMPLEXITY: Low      PLAN:  OT FREQUENCY: 1-2x/week  OT DURATION: 6 weeks (through 01/05/23 as needed)   PLANNED INTERVENTIONS: self care/ADL training, therapeutic exercise, therapeutic activity, neuromuscular re-education, manual therapy, passive range of motion, splinting, electrical stimulation, ultrasound, fluidotherapy, compression bandaging, moist heat, cryotherapy, contrast bath, patient/family education, and coping strategies training  RECOMMENDED OTHER SERVICES: none now   CONSULTED AND AGREED WITH PLAN OF CARE: Patient  PLAN FOR NEXT SESSION: Reviewed use of brace and custom wrist strap, review initial home exercise program and upgrade to light hand strengthening and wrist strengthening as tolerated.  Benito Mccreedy, OTR/L, CHT 11/21/2022, 5:36 PM

## 2022-11-21 ENCOUNTER — Ambulatory Visit (INDEPENDENT_AMBULATORY_CARE_PROVIDER_SITE_OTHER): Payer: 59 | Admitting: Rehabilitative and Restorative Service Providers"

## 2022-11-21 ENCOUNTER — Other Ambulatory Visit: Payer: Self-pay

## 2022-11-21 ENCOUNTER — Encounter: Payer: Self-pay | Admitting: Rehabilitative and Restorative Service Providers"

## 2022-11-21 DIAGNOSIS — M25632 Stiffness of left wrist, not elsewhere classified: Secondary | ICD-10-CM

## 2022-11-21 DIAGNOSIS — R600 Localized edema: Secondary | ICD-10-CM

## 2022-11-21 DIAGNOSIS — M6281 Muscle weakness (generalized): Secondary | ICD-10-CM

## 2022-11-21 DIAGNOSIS — M25532 Pain in left wrist: Secondary | ICD-10-CM

## 2022-11-22 ENCOUNTER — Other Ambulatory Visit: Payer: Self-pay | Admitting: Family Medicine

## 2022-11-22 DIAGNOSIS — F419 Anxiety disorder, unspecified: Secondary | ICD-10-CM

## 2022-11-23 ENCOUNTER — Other Ambulatory Visit (HOSPITAL_COMMUNITY): Payer: Self-pay

## 2022-11-23 MED ORDER — SERTRALINE HCL 100 MG PO TABS
150.0000 mg | ORAL_TABLET | Freq: Every day | ORAL | 1 refills | Status: DC
  Filled 2022-11-23: qty 135, 90d supply, fill #0
  Filled 2022-12-08 – 2023-01-18 (×2): qty 135, 90d supply, fill #1

## 2022-11-25 ENCOUNTER — Other Ambulatory Visit (HOSPITAL_COMMUNITY): Payer: Self-pay

## 2022-11-28 ENCOUNTER — Ambulatory Visit
Admission: EM | Admit: 2022-11-28 | Discharge: 2022-11-28 | Disposition: A | Discharge status: Home / Self Care | Payer: 59 | Attending: Family Medicine | Admitting: Family Medicine

## 2022-11-28 ENCOUNTER — Encounter: Payer: Self-pay | Admitting: Emergency Medicine

## 2022-11-28 DIAGNOSIS — I952 Hypotension due to drugs: Secondary | ICD-10-CM

## 2022-11-28 DIAGNOSIS — R001 Bradycardia, unspecified: Secondary | ICD-10-CM

## 2022-11-28 DIAGNOSIS — T50905A Adverse effect of unspecified drugs, medicaments and biological substances, initial encounter: Secondary | ICD-10-CM | POA: Diagnosis not present

## 2022-11-28 DIAGNOSIS — R42 Dizziness and giddiness: Secondary | ICD-10-CM | POA: Diagnosis not present

## 2022-11-28 MED ORDER — SODIUM CHLORIDE 0.9 % IV SOLN: Freq: Once | INTRAVENOUS | Status: AC

## 2022-11-28 MED ORDER — SODIUM CHLORIDE 0.9 % IV BOLUS
1000.0000 mL | Freq: Once | INTRAVENOUS | Status: AC
  Administered 2022-11-28: 1000 mL via INTRAVENOUS

## 2022-11-28 NOTE — Therapy (Incomplete)
OUTPATIENT OCCUPATIONAL THERAPY TREATMENT NOTE  Patient Name: [name] MRN: [id number] DOB:08-03-1961, 61 y.o., female Today's Date: 11/28/2022  PCP: Roma Schanz, DO REFERRING PROVIDER:  Gregor Hams, MD    END OF SESSION:    Past Medical History:  Diagnosis Date   Allergy    seasonal allergies   Anxiety    on meds   Arthritis    on meds   Depression    on meds   Dizziness 03/05/2017   Essential hypertension 03/05/2017   on meds   GERD (gastroesophageal reflux disease)    on meds   Hyperlipidemia 03/05/2017   on meds   Infertility, female    Migraines    Osteopenia    Past Surgical History:  Procedure Laterality Date   ANKLE SURGERY Left 1979   COLONOSCOPY  2011   MS-F/V-moviprep(exc)-normal -10 yr recall   SHOULDER SURGERY Left 1990   Patient Active Problem List   Diagnosis Date Noted   Distal radius fracture, left 08/16/2022   Depression, major, single episode, severe ([place]) 09/02/2021   Preventative health care 09/02/2021   Strain of lumbar region 07/29/2021   Chronic migraine w/o aura w/o status migrainosus, not intractable 12/24/2020   Trigger finger, acquired 12/15/2019   Chronic migraine 07/05/2018   Stress at home 11/09/2017   Essential hypertension 03/05/2017   Dizziness 03/05/2017   Hyperlipidemia 03/05/2017   Plantar fasciitis of right foot 02/02/2016   Chronic rhinitis 10/20/2015   Cough, persistent 10/20/2015   Neck pain 03/16/2015   Overweight 01/22/2015   Depression with anxiety 07/14/2014   Insomnia 05/28/2012   Migraine 05/28/2012   Muscle spasm 05/28/2012   HEADACHE 04/07/2009    ONSET DATE: DOI: 08/12/22  REFERRING DIAG:  T01.779T (ICD-10-CM) - Other closed fracture of distal end of left radius, initial encounter  M25.632 (ICD-10-CM) - Stiffness of left wrist joint    THERAPY DIAG:  No diagnosis found.  Rationale for Evaluation and Treatment: Rehabilitation  PERTINENT HISTORY: ~14 weeks post fx now. She  slipped and fell at a concert on 9/0/30, breaking Lt wrist. She has had conservative tx so far. Per MD's last notes, her fx site is fairly well healed now.  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 She states pushing with Lt arm to get out of bed of to stand is painful, carrying her work laptop is painful, but she's been wearing pre-fab wrist cock-up brace. Also having difficulty cooking, opening jars, etc. "  PRECAUTIONS: None; WEIGHT BEARING RESTRICTIONS: WBAT to Lt wrist    SUBJECTIVE:   SUBJECTIVE STATEMENT: She states ***   PAIN:  Are you having pain? *** Yes in Lt wrist Rating: *** 1-2/10 at rest now, up to *** 3/10 at worst in past week, carrying laptop   PATIENT GOALS: To have less pain in left wrist    OBJECTIVE: (All objective assessments below are from initial evaluation on: 11/21/22 unless otherwise specified.)   HAND DOMINANCE: Right   ADLs: Overall ADLs: States decreased ability to grab, hold household objects, pain and inability to open containers, perform FMS tasks (manipulate fasteners on clothing)   FUNCTIONAL OUTCOME MEASURES: Eval: Patient Rated Wrist Evaluation (PRWE): Pain: 10/50; Function: 11.5/50; Total Score: 21.5/100 (Higher Score  =  More Pain and/or Debility)    UPPER EXTREMITY ROM     Shoulder to Wrist AROM Left eval Lt 11/30/22  Forearm supination 75  Forearm pronation  77   Wrist flexion 57 (87* opposite)  ***  Wrist extension 53 (77* opposite) ***  Wrist ulnar deviation 27   Wrist radial deviation 12   Functional dart thrower's motion (F-DTM) in ulnar flexion 44   F-DTM in radial extension  67   (Blank rows = not tested)   Hand AROM Right eval Left eval  Full Fist Ability (or Gap to Distal Palmar Crease)    Thumb Opposition  (Kapandji Scale)     Thumb MCP (0-60)    Thumb IP (0-80)    Thumb Radial Abduction Span     Thumb Palmar Abduction Span      Index MCP (0-90)     Index PIP (0-100)     Index DIP (0-70)      Long MCP (0-90)      Long PIP (0-100)      Long DIP (0-70)      Ring MCP (0-90)      Ring PIP (0-100)      Ring DIP (0-70)      Little MCP (0-90)      Little PIP (0-100)      Little DIP (0-70)      (Blank rows = not tested)   UPPER EXTREMITY MMT:     MMT Left TBD  Forearm supination 4-/5  Forearm pronation 4-/5  Wrist flexion 3-/5 MMT  Wrist extension 3-/5 MMT  Wrist ulnar deviation   Wrist radial deviation   (Blank rows = not tested)  HAND FUNCTION: Eval: Observed weakness in affected hand.  Grip strength Right: 61 lbs, Left: 50 lbs   COORDINATION: Eval: Observed coordination impairments with affected hand. Box and Blocks Test: Lt 58 Blocks today (62 is WFL)  SENSATION: Eval: Light touch intact today  EDEMA:   Eval:  Mildly swollen in Lt wrist today, as well as Lt MF.   COGNITION: Eval: Overall cognitive status: WFL for evaluation today   OBSERVATIONS:   Eval: TTP slightly to distal dorsal radius, no overt DRUJ instability or ulnar sided pain.   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  (Overt Heberden's and Bouchard's nodes)   TODAY'S TREATMENT:  11/30/22: ***Reviewed use of brace and custom wrist strap, review initial home exercise program and upgrade to light hand strengthening and wrist strengthening as tolerated.   Post-evaluation treatment: OT educates to limit painful range of motion weightbearing bearing, etc.  She should only wear braces when needed to support her wrist and not when at rest or doing light activities that are nonpainful.  OT fabricates a custom wrist strap today that provides compression and pressure that she states feels "good" and supports her wrist motion and forearm motion.  She should wear this as a stepdown from her prefabricated wrist cock up as tolerated.  She was also educated on the  following home exercise program that starts with light active range of motion to endpoints, with slight overpressure as tolerated, to light to medium self stretches at the the wrist.  OT demonstrates thumb and then she demonstrates some back to show knowledge.  She does well and has no added pain at the end of the session  Exercises - Turn Palm Facing Up & Down  - 4-6 x daily - 10-15 reps - Bend and Pull Back Wrist SLOWLY  - 4 x daily - 10-15 reps - "Windshield Wipers"   -  4 x daily - 10-15 reps - Wrist Flexion Stretch  - 4 x daily - 3-5 reps - 15 sec hold - Wrist Prayer Stretch  - 4 x daily - 3-5 reps - 15 sec hold - Seated Wrist Ulnar Deviation Stretch  - 3-5 x daily - 3-5 reps - 15 sec hold - Seated Wrist Radial Deviation Stretch  - 3-5 x daily - 3-5 reps - 15 hold   PATIENT EDUCATION: Education details: See tx section above for details  Person educated: Patient Education method: Verbal Instruction, Teach back, Handouts  Education comprehension: States and demonstrates understanding, Additional Education required    HOME EXERCISE PROGRAM: Access Code: [email or web address] URL: [email or web address] Date: 11/21/2022 Prepared by: Benito Mccreedy   GOALS: Goals reviewed with patient? Yes   SHORT TERM GOALS: (STG required if POC>30 days) Target Date: 12/08/22  Pt will obtain protective, custom orthotic. Goal status:  MET   2.  Pt will demo/state understanding of initial HEP to improve pain levels and prerequisite motion. Goal status: Progressing   LONG TERM GOALS: Target Date: 01/05/23   Pt will improve functional ability by decreased impairment per PRWE assessment from 21.5/100 to 10/100 or better, for better quality of life. Goal status: INITIAL  2.  Pt will improve A/ROM in Lt wrist flex/ext from 57/53, respectively to at least 70* each, to have functional motion for tasks like reach and grasp.  Goal status: INITIAL  4.  Pt will improve strength in Lt wrist ext  from 3-/5 MMT to at least 4+/5 MMT to have increased functional ability to carry out selfcare and higher-level homecare tasks with no difficulty. Goal status: INITIAL  5.  Pt will improve coordination skills in Lt arm, as seen by [facility] score on BBT testing to have increased functional ability to carry out fine motor tasks (fasteners, etc.) and more complex, coordinated IADLs (meal prep, sports, etc.).  Goal status: INITIAL  6.  Pt will decrease pain at rest from ~2/10 to 0/10 or better to have better sleep and occupational participation in daily roles. Goal status: INITIAL   ASSESSMENT:  CLINICAL IMPRESSION: 11/30/22: ***  Eval: Patient is a 61 y.o. female who was seen today for occupational therapy evaluation for stiff and sore, weak left wrist after fracture and decreased functional ability.  She will benefit from a course of outpatient occupational therapy to improve her functional status.   PLAN:  OT FREQUENCY: 1-2x/week  OT DURATION: 6 weeks (through 01/05/23 as needed)   PLANNED INTERVENTIONS: self care/ADL training, therapeutic exercise, therapeutic activity, neuromuscular re-education, manual therapy, passive range of motion, splinting, electrical stimulation, ultrasound, fluidotherapy, compression bandaging, moist heat, cryotherapy, contrast bath, patient/family education, and coping strategies training  CONSULTED AND AGREED WITH PLAN OF CARE: Patient  PLAN FOR NEXT SESSION:  ***   Benito Mccreedy, OTR/L, CHT 11/28/2022, 10:29 AM

## 2022-11-28 NOTE — ED Triage Notes (Signed)
Migraine HA Took home meds  Feels dizzy  Unable to obtain BP via dinamapp

## 2022-11-28 NOTE — ED Provider Notes (Signed)
[facility]    CSN: [id number] Arrival date & time: 11/28/22  [id number]      History   Chief Complaint Chief Complaint  Patient presents with   Dizziness    HPI SIDNEY SILBERMAN is a 61 y.o. female.   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  She has a history of migraine headaches.  She has a variety of medicine she takes for her migraines, and unfortunately has them frequently.  She had a migraine today and today.  She has taken her migraine medicines.  She came to work and then started feeling quite dizzy.  The nurse on duty was unable to get her blood pressure with Dinamap machine, I had a manual blood pressure at 80/48.  Her pulse is in the 40s.  She is quite hypotensive.  We registered into the clinic to receive IV fluids.  Past Medical History:  Diagnosis Date   Allergy    seasonal allergies   Anxiety    on meds   Arthritis    on meds   Depression    on meds   Dizziness 03/05/2017   Essential hypertension 03/05/2017   on meds   GERD (gastroesophageal reflux disease)    on meds   Hyperlipidemia 03/05/2017   on meds   Infertility, female    Migraines    Osteopenia     Patient Active Problem List   Diagnosis Date Noted   Distal radius fracture, left 08/16/2022   Depression, major, single episode, severe ([place]) 09/02/2021   Preventative health care 09/02/2021   Strain of lumbar region 07/29/2021   Chronic migraine w/o aura w/o status migrainosus, not intractable 12/24/2020   Trigger finger, acquired 12/15/2019   Chronic migraine 07/05/2018   Stress at home 11/09/2017   Essential hypertension 03/05/2017   Dizziness 03/05/2017   Hyperlipidemia 03/05/2017   Plantar fasciitis of right foot 02/02/2016   Chronic rhinitis 10/20/2015   Cough, persistent 10/20/2015   Neck pain 03/16/2015   Overweight 01/22/2015   Depression with anxiety 07/14/2014   Insomnia 05/28/2012   Migraine 05/28/2012   Muscle spasm 05/28/2012   HEADACHE 04/07/2009    Past  Surgical History:  Procedure Laterality Date   ANKLE SURGERY Left 1979   COLONOSCOPY  2011   MS-F/V-moviprep(exc)-normal -10 yr recall   SHOULDER SURGERY Left 1990    OB History     Gravida  1   Para  1   Term  1   Preterm  0   AB  0   Living  1      SAB  0   IAB  0   Ectopic  0   Multiple  0   Live Births  1            Home Medications    Prior to Admission medications   Medication Sig Start Date End Date Taking? Authorizing Provider  amLODipine (NORVASC) 5 MG tablet Take 1 tablet (5 mg total) by mouth daily. 11/09/22   Buford Dresser, MD  Ascorbic Acid (VITAMIN C PO) Take by mouth.    [provider]  aspirin EC 81 MG tablet Take 81 mg by mouth daily.    [provider]  baclofen (LIORESAL) 10 MG tablet TAKE 1 TABLET BY MOUTH EVERY 8 HOURS AS NEEDED 10/13/22 10/13/23  Jaclyn Prime, Collene Leyden, PA-C  BOTOX 100 units SOLR injection INJECT 100 UNITS INTO THE MUSCLE EVERY 3 MONTHS. 10/13/22 10/13/23  Teague  Bobbye Morton, PA-C  Butalbital-APAP-Caffeine (FIORICET) 50-300-40 MG CAPS Take 1-2 capsules by mouth every 8 hours if needed 10/13/22   Jaclyn Prime, Collene Leyden, PA-C  Calcium Carbonate (CALCIUM 600 PO) Take by mouth.    [provider]  celecoxib (CELEBREX) 100 MG capsule Take 1 capsule (100 mg total) by mouth 2 (two) times daily. 09/04/22   Ann Held, DO  COVID-19 At Home Antigen Test [facility] COVID-19 HOME TEST) KIT Use as directed within package instructions 10/19/21   Edmon Crape, [facility]  ezetimibe (ZETIA) 10 MG tablet Take 1 tablet (10 mg total) by mouth daily. 09/04/22   Roma Schanz R, DO  fluticasone (FLONASE) 50 MCG/ACT nasal spray Place 2 sprays into both nostrils daily. 07/17/22   Ann Held, DO  Fluticasone Propionate (FLONASE NA) Place into the nose.    [provider]  Galcanezumab-gnlm [facility]) 120 MG/ML SOAJ INJECT 1 PEN EVERY 30 DAYS 04/07/22 04/07/23  Jaclyn Prime, Collene Leyden, PA-C   hydrOXYzine (VISTARIL) 50 MG capsule TAKE 2 CAPSULES BY MOUTH 3 TIMES DAILY AS NEEDED FOR ITCHING 09/04/22   Carollee Herter, Alferd Apa, DO  ibuprofen (ADVIL) 800 MG tablet Take 1 tablet (800 mg total) by mouth every 8 (eight) hours as needed. 07/01/21   Jaclyn Prime, Collene Leyden, PA-C  magnesium oxide (MAG-OX) 400 MG tablet Take 400 mg by mouth daily.    [provider]  Multiple Vitamins-Minerals (ZINC PO) Take by mouth.    [provider]  naratriptan (AMERGE) 2.5 MG tablet TAKE 1 TABLET BY MOUTH AT ONSET OF HEADACHE. IF HEADACHE RETURNS OR DOES NOT RESOLVE MAY REPEAT AFTER 4 HOURS. DO NOT EXCEED 2 TABLETS IN 24 HOURS 10/13/22 10/13/23  Jaclyn Prime, Collene Leyden, PA-C  NONFORMULARY OR COMPOUNDED ITEM Gaba Calm as needed for anxiety 05/20/18   Carollee Herter, Alferd Apa, DO  nystatin (MYCOSTATIN) 100000 UNIT/ML suspension Take 5 mLs by mouth four times daily. 06/07/21   Raylene Everts, MD  ondansetron (ZOFRAN) 8 MG tablet TAKE 1 TABLET BY MOUTH EVERY 8 HOURS AS NEEDED 04/07/22 04/07/23  Jaclyn Prime, Collene Leyden, PA-C  pantoprazole (PROTONIX) 40 MG tablet Take 1 tablet (40 mg total) by mouth daily. 09/04/22   Ann Held, DO  Probiotic Product (PROBIOTIC DAILY PO) Take by mouth.    [provider]  Pseudoephedrine HCl (SUDAFED PO) Take 1 tablet by mouth daily as needed.    [provider]  RESTASIS 0.05 % ophthalmic emulsion Place 1 drop into both eyes 2 times daily. 04/10/22   Ann Held, DO  Rimegepant Sulfate (NURTEC) 75 MG TBDP Dissolve 1 tablet by mouth every other day. 10/13/22   Jaclyn Prime, Collene Leyden, PA-C  rosuvastatin (CRESTOR) 40 MG tablet Take 1 tablet (40 mg total) by mouth daily. 11/09/22   Buford Dresser, MD  sertraline (ZOLOFT) 100 MG tablet Take 1.5 tablets (150 mg total) by mouth daily. 11/23/22   Mosie Lukes, MD  sulfamethoxazole-trimethoprim (BACTRIM DS) 800-160 MG tablet Take 1 tablet by mouth 2 (two) times daily. 03/22/22   Mar Daring, PA-C  tiZANidine (ZANAFLEX) 4 MG tablet TAKE 1 TABLET BY MOUTH 3 TIMES DAILY 10/13/22 10/13/23  Jaclyn Prime, Collene Leyden, PA-C  topiramate (TOPAMAX) 200 MG tablet TAKE 1 TABLET BY MOUTH ONCE DAILY 04/07/22 04/07/23  Jaclyn Prime, Collene Leyden, PA-C  traZODone (DESYREL) 50 MG tablet TAKE 1/2 TO 1 TABLET BY MOUTH AT BEDTIME AS NEEDED FOR SLEEP  06/14/22 06/14/23  Ann Held, DO  triamcinolone cream (KENALOG) 0.1 % Apply 1 Application topically 2 (two) times daily. 09/04/22   Carollee Herter, Sonjia Wilcoxson R, DO  TYRVAYA 0.03 MG/ACT SOLN Use 1 spray in each nostril 2 times every day approximately 12 hours apart 09/07/21     TYRVAYA 0.03 MG/ACT SOLN Place 1 spray nasally 2 times every day in each nostril approximately 12 hours apart 02/20/22     UNABLE TO FIND CBD 50 mg    [provider]  vitamin B-12 (CYANOCOBALAMIN) 500 MCG tablet Take 500 mcg by mouth daily.    [provider]  VITAMIN D PO Take by mouth.    [provider]    Family History Family History  Problem Relation Age of Onset   Hypertension Mother    Allergic rhinitis Mother    Sleep apnea Mother    Hyperlipidemia Mother    Cancer Maternal Grandmother    Stroke Other    Eczema Daughter    Alcohol abuse Father    Stroke Father    Heart disease Maternal Grandfather    Alcohol abuse Paternal Grandfather    Colon polyps Neg Hx    Colon cancer Neg Hx    Esophageal cancer Neg Hx    Rectal cancer Neg Hx    Stomach cancer Neg Hx     Social History Social History   Tobacco Use   Smoking status: Former    Types: Cigarettes    Quit date: 02/09/1992    Years since quitting: 30.8   Smokeless tobacco: Never  Vaping Use   Vaping Use: Never used  Substance Use Topics   Alcohol use: Yes    Alcohol/week: 12.0 standard drinks of alcohol    Types: 12 Standard drinks or equivalent per week   Drug use: No     Allergies   Augmentin [amoxicillin-pot clavulanate]   Review of Systems Review of Systems See  HPI  Physical Exam Triage Vital Signs ED Triage Vitals  Enc Vitals Group     BP 11/28/22 0842 (!) 80/58     Pulse Rate 11/28/22 0842 (!) 41     Resp 11/28/22 0842 16     Temp 11/28/22 0842 97.6 F (36.4 C)     Temp Source 11/28/22 0842 Tympanic     SpO2 11/28/22 0842 95 %     Weight 11/28/22 0844 172 lb 13.5 oz (78.4 kg)     Height 11/28/22 0844 _0  (1.626 m)     Head Circumference --      Peak Flow --      Pain Score 11/28/22 0843 2     Pain Loc --      Pain Edu? --      Excl. in [place]? --    No data found.  Updated Vital Signs BP 100/60 (BP Location: Left Arm)   Pulse (!) 48   Temp 97.6 F (36.4 C) (Oral)   Resp 14   Ht _1  (1.626 m)   Wt 78.4 kg   SpO2 94%   BMI 29.67 kg/m       Physical Exam Constitutional:      General: She is not in acute distress.    Appearance: She is well-developed. She is ill-appearing.     Comments: Patient is pale.  Appears tired  HENT:     Head: Normocephalic and atraumatic.  Eyes:     Conjunctiva/sclera: Conjunctivae normal.     Pupils: Pupils are  equal, round, and reactive to light.  Cardiovascular:     Rate and Rhythm: Normal rate.  Pulmonary:     Effort: Pulmonary effort is normal. No respiratory distress.  Abdominal:     General: There is no distension.     Palpations: Abdomen is soft.  Musculoskeletal:        General: Normal range of motion.     Cervical back: Normal range of motion.  Skin:    General: Skin is warm and dry.  Neurological:     Mental Status: She is alert.      UC Treatments / Results  Labs (all labs ordered are listed, but only abnormal results are displayed) Labs Reviewed - No data to display  EKG   Radiology No results found.  Procedures Procedures (including critical care time)  Medications Ordered in UC Medications  sodium chloride 0.9 % bolus 1,000 mL (0 mLs Intravenous Stopped 11/28/22 1000)  0.9 %  sodium chloride infusion (0 mLs Intravenous Stopped 11/28/22 1100)     Initial Impression / Assessment and Plan / UC Course  I have reviewed the triage vital signs and the nursing notes.  Pertinent labs & imaging results that were available during my care of the patient were reviewed by me and considered in my medical decision making (see chart for details).     Patient took multiple migraine medicines to try to treat her headache.  All of these prescribed by her physicians.  Unfortunately, they have caused a reaction and now she is hypotensive, bradycardic, and due to the poor appetite dehydrated.  She she received 2 L of IV fluids.  She was given a Coca-Cola to drink.  She felt well enough to drive home, was cautioned to call her primary care doctor and a migraine doctor for a new regimen of medications. Final Clinical Impressions(s) / UC Diagnoses   Final diagnoses:  Dizziness  Hypotension due to drugs  Adverse effect of drug, initial encounter  Bradycardia     Discharge Instructions      Home to rest Continue to push fluids Be careful when changing positions from sitting or laying to standing, prevent falls Call your primary care doctor or migraine doctor to report problems with your medication     ED Prescriptions   None    PDMP not reviewed this encounter.   Raylene Everts, MD 11/28/22 [phone]

## 2022-11-28 NOTE — Discharge Instructions (Signed)
Home to rest Continue to push fluids Be careful when changing positions from sitting or laying to standing, prevent falls Call your primary care doctor or migraine doctor to report problems with your medication

## 2022-11-30 ENCOUNTER — Other Ambulatory Visit (HOSPITAL_COMMUNITY): Payer: Self-pay

## 2022-11-30 ENCOUNTER — Encounter: Payer: 59 | Admitting: Rehabilitative and Restorative Service Providers"

## 2022-11-30 DIAGNOSIS — M9901 Segmental and somatic dysfunction of cervical region: Secondary | ICD-10-CM | POA: Diagnosis not present

## 2022-11-30 DIAGNOSIS — G4486 Cervicogenic headache: Secondary | ICD-10-CM | POA: Diagnosis not present

## 2022-11-30 DIAGNOSIS — M62838 Other muscle spasm: Secondary | ICD-10-CM | POA: Diagnosis not present

## 2022-11-30 DIAGNOSIS — M9902 Segmental and somatic dysfunction of thoracic region: Secondary | ICD-10-CM | POA: Diagnosis not present

## 2022-11-30 MED ORDER — AZITHROMYCIN 250 MG PO TABS
ORAL_TABLET | ORAL | 0 refills | Status: DC
  Filled 2022-11-30: qty 6, 5d supply, fill #0

## 2022-12-01 ENCOUNTER — Other Ambulatory Visit (HOSPITAL_COMMUNITY): Payer: Self-pay

## 2022-12-05 ENCOUNTER — Other Ambulatory Visit (HOSPITAL_COMMUNITY): Payer: Self-pay

## 2022-12-07 ENCOUNTER — Encounter: Payer: Self-pay | Admitting: Rehabilitative and Restorative Service Providers"

## 2022-12-07 ENCOUNTER — Ambulatory Visit (INDEPENDENT_AMBULATORY_CARE_PROVIDER_SITE_OTHER): Payer: 59 | Admitting: Rehabilitative and Restorative Service Providers"

## 2022-12-07 DIAGNOSIS — M6281 Muscle weakness (generalized): Secondary | ICD-10-CM | POA: Diagnosis not present

## 2022-12-07 DIAGNOSIS — M9901 Segmental and somatic dysfunction of cervical region: Secondary | ICD-10-CM | POA: Diagnosis not present

## 2022-12-07 DIAGNOSIS — R600 Localized edema: Secondary | ICD-10-CM

## 2022-12-07 DIAGNOSIS — M25632 Stiffness of left wrist, not elsewhere classified: Secondary | ICD-10-CM | POA: Diagnosis not present

## 2022-12-07 DIAGNOSIS — M25532 Pain in left wrist: Secondary | ICD-10-CM

## 2022-12-07 DIAGNOSIS — M9902 Segmental and somatic dysfunction of thoracic region: Secondary | ICD-10-CM | POA: Diagnosis not present

## 2022-12-07 DIAGNOSIS — G4486 Cervicogenic headache: Secondary | ICD-10-CM | POA: Diagnosis not present

## 2022-12-07 DIAGNOSIS — M62838 Other muscle spasm: Secondary | ICD-10-CM | POA: Diagnosis not present

## 2022-12-07 NOTE — Therapy (Addendum)
OUTPATIENT OCCUPATIONAL THERAPY TREATMENT & DISCHARGE NOTE  Patient Name: [name] MRN: [id number] DOB:10/26/1961, 61 y.o., female Today's Date: 12/07/2022  PCP: Roma Schanz, DO REFERRING PROVIDER:  Gregor Hams, MD    END OF SESSION:  OT End of Session - 12/07/22 1517     Visit Number 2    Number of Visits 8    Date for OT Re-Evaluation 01/05/23    Authorization Type [employer]    OT Start Time [id number]    OT Stop Time 1602    OT Time Calculation (min) 45 min    Equipment Utilized During Treatment orthotic strapping    Activity Tolerance Patient tolerated treatment well;No increased pain;Patient limited by fatigue;Patient limited by pain    Behavior During Therapy [facility] for tasks assessed/performed             Past Medical History:  Diagnosis Date   Allergy    seasonal allergies   Anxiety    on meds   Arthritis    on meds   Depression    on meds   Dizziness 03/05/2017   Essential hypertension 03/05/2017   on meds   GERD (gastroesophageal reflux disease)    on meds   Hyperlipidemia 03/05/2017   on meds   Infertility, female    Migraines    Osteopenia    Past Surgical History:  Procedure Laterality Date   ANKLE SURGERY Left 1979   COLONOSCOPY  2011   MS-F/V-moviprep(exc)-normal -58 yr recall   SHOULDER SURGERY Left 1990   Patient Active Problem List   Diagnosis Date Noted   Distal radius fracture, left 08/16/2022   Depression, major, single episode, severe ([place]) 09/02/2021   Preventative health care 09/02/2021   Strain of lumbar region 07/29/2021   Chronic migraine w/o aura w/o status migrainosus, not intractable 12/24/2020   Trigger finger, acquired 12/15/2019   Chronic migraine 07/05/2018   Stress at home 11/09/2017   Essential hypertension 03/05/2017   Dizziness 03/05/2017   Hyperlipidemia 03/05/2017   Plantar fasciitis of right foot 02/02/2016   Chronic rhinitis 10/20/2015   Cough, persistent 10/20/2015   Neck pain  03/16/2015   Overweight 01/22/2015   Depression with anxiety 07/14/2014   Insomnia 05/28/2012   Migraine 05/28/2012   Muscle spasm 05/28/2012   HEADACHE 04/07/2009    ONSET DATE: DOI: 08/12/22  REFERRING DIAG:  [id number] (ICD-10-CM) - Other closed fracture of distal end of left radius, initial encounter  M25.632 (ICD-10-CM) - Stiffness of left wrist joint    THERAPY DIAG:  Localized edema  Muscle weakness (generalized)  Pain in left wrist  Stiffness of left wrist, not elsewhere classified  Rationale for Evaluation and Treatment: Rehabilitation  PERTINENT HISTORY: ~14 weeks post fx now. She slipped and fell at a concert on [id number], breaking Lt wrist. She has had conservative tx so far. Per MD's last notes, her fx site is fairly well healed now.  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 She states pushing with Lt arm to get out of bed of to stand is painful, carrying her work laptop is painful, but she's been wearing pre-fab wrist cock-up brace. Also having difficulty cooking, opening jars, etc. "  PRECAUTIONS: None; WEIGHT BEARING RESTRICTIONS: WBAT to Lt wrist    SUBJECTIVE:   SUBJECTIVE STATEMENT: She states feeling like the pain is the same, but also not doing HEP consistently since last  seen.    PAIN:  Are you having pain?  Yes in Lt wrist Rating:  2-3/10 at rest now, up to  3/10 at worst in past week, carrying laptop   PATIENT GOALS: To have less pain in left wrist    OBJECTIVE: (All objective assessments below are from initial evaluation on: 11/21/22 unless otherwise specified.)   HAND DOMINANCE: Right   ADLs: Overall ADLs: States decreased ability to grab, hold household objects, pain and inability to open containers, perform FMS tasks (manipulate fasteners on clothing)   FUNCTIONAL OUTCOME MEASURES: Eval: Patient Rated Wrist Evaluation (PRWE): Pain: 10/50; Function: 11.5/50; Total  Score: 21.5/100 (Higher Score  =  More Pain and/or Debility)    UPPER EXTREMITY ROM     Shoulder to Wrist AROM Left eval Lt 11/30/22  Forearm supination 75   Forearm pronation  77   Wrist flexion 57 (87* opposite)  56  Wrist extension 53 (77* opposite) 60  Wrist ulnar deviation 27   Wrist radial deviation 12   Functional dart thrower's motion (F-DTM) in ulnar flexion 44   F-DTM in radial extension  67   (Blank rows = not tested)   Hand AROM Left TBD PRN  Full Fist Ability (or Gap to Distal Palmar Crease)   Thumb Opposition  (Kapandji Scale)    Long MCP (0-90)    Long PIP (0-100)    Long DIP (0-70)    (Blank rows = not tested)   UPPER EXTREMITY MMT:     MMT Left TBD  Forearm supination 4-/5  Forearm pronation 4-/5  Wrist flexion 3-/5 MMT  Wrist extension 3-/5 MMT  Wrist ulnar deviation   Wrist radial deviation   (Blank rows = not tested)  HAND FUNCTION: Eval: Observed weakness in affected hand.  Grip strength Right: 61 lbs, Left: 50 lbs   COORDINATION: Eval: Observed coordination impairments with affected hand. Box and Blocks Test: Lt 58 Blocks today (62 is WFL)  EDEMA:   Eval:  Mildly swollen in Lt wrist today, as well as Lt MF.   OBSERVATIONS:   Eval: TTP slightly to distal dorsal radius, no overt DRUJ instability or ulnar sided pain.   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  (Overt Heberden's and Bouchard's nodes)   TODAY'S TREATMENT:  12/07/22: OT fabricated new custom wrist strap as she states losing hers, and relying upon it because it helps decrease her pain.  OT also recommends a pair of arthritis gloves for her as she states that her left middle finger is more stiff and sometimes painful with grip now and has obvious deformities from arthritis.  OT then reviews initial home exercise program with her as she had not been doing it consistently.  After which she states her  pain goes down from a 3 out of 10 to a 1.5 out of 10.  OT then upgrades her to light isometric strengthening at the wrist in flexion extension and ulnar and radial radial deviation.  She tolerates these with 10-second holds 3 times each with pain actually decreasing.  At the end of treatment she states "I really do feel better."  OT encourages her to be consistent with home program at least once every morning if not twice a day as able.  Exercises - Turn [employer] Facing Up & Down  - 4-6 x daily - 10-15 reps - Bend and Pull Back Wrist SLOWLY  -  4 x daily - 10-15 reps - "Windshield Wipers"   - 4 x daily - 10-15 reps - Wrist Flexion Stretch  - 4 x daily - 3-5 reps - 15 sec hold - Wrist Prayer Stretch  - 4 x daily - 3-5 reps - 15 sec hold - Seated Wrist Ulnar Deviation Stretch  - 3-5 x daily - 3-5 reps - 15 sec hold - Seated Wrist Radial Deviation Stretch  - 3-5 x daily - 3-5 reps - 15 hold - Seated Isometric Wrist Radial Deviation with Manual Resistance  - 4-6 x daily - 1 sets - 10-15 reps - Seated Isometric Wrist Flexion Neutral  - 4-6 x daily - 1 sets - 10-15 reps   PATIENT EDUCATION: Education details: See tx section above for details  Person educated: Patient Education method: Verbal Instruction, Teach back, Handouts  Education comprehension: States and demonstrates understanding, Additional Education required    HOME EXERCISE PROGRAM: Access Code: [email or web address] URL: [email or web address] Date: 11/21/2022 Prepared by: Benito Mccreedy   GOALS: Goals reviewed with patient? Yes   SHORT TERM GOALS: (STG required if POC>30 days) Target Date: 12/08/22  Pt will obtain protective, custom orthotic. Goal status:  MET   2.  Pt will demo/state understanding of initial HEP to improve pain levels and prerequisite motion. Goal status: Progressing   LONG TERM GOALS: Target Date: 01/05/23   Pt will improve functional ability by decreased impairment per PRWE assessment from 21.5/100  to 10/100 or better, for better quality of life. Goal status: INITIAL  2.  Pt will improve A/ROM in Lt wrist flex/ext from 57/53, respectively to at least 70* each, to have functional motion for tasks like reach and grasp.  Goal status: INITIAL  4.  Pt will improve strength in Lt wrist ext from 3-/5 MMT to at least 4+/5 MMT to have increased functional ability to carry out selfcare and higher-level homecare tasks with no difficulty. Goal status: INITIAL  5.  Pt will improve coordination skills in Lt arm, as seen by [facility] score on BBT testing to have increased functional ability to carry out fine motor tasks (fasteners, etc.) and more complex, coordinated IADLs (meal prep, sports, etc.).  Goal status: INITIAL  6.  Pt will decrease pain at rest from ~2/10 to 0/10 or better to have better sleep and occupational participation in daily roles. Goal status: INITIAL   ASSESSMENT:  CLINICAL IMPRESSION: 12/07/22: She definitely got a benefit from treatment today, but has not been consistent at home with exercises yet.  She was encouraged to be consistent if she wishes to make a change in her life and her symptoms.  She states understanding. We will try to upgrade strength and functional ability neck session as tolerated   PLAN:  OT FREQUENCY: 1-2x/week  OT DURATION: 6 weeks (through 01/05/23 as needed)   PLANNED INTERVENTIONS: self care/ADL training, therapeutic exercise, therapeutic activity, neuromuscular re-education, manual therapy, passive range of motion, splinting, electrical stimulation, ultrasound, fluidotherapy, compression bandaging, moist heat, cryotherapy, contrast bath, patient/family education, and coping strategies training  CONSULTED AND AGREED WITH PLAN OF CARE: Patient  PLAN FOR NEXT SESSION:  Check new home exercises including isometric strength at the wrist, upgrade to concentric strength as appropriate.  Check goals check range of motion etc.   Benito Mccreedy, OTR/L,  CHT 12/07/2022, 4:14 PM   OCCUPATIONAL THERAPY DISCHARGE SUMMARY  Visits from Start of Care: 2 Unfortunately she did not return to therapy and is discharged at this point. See notes for details, goals  could not be addressed.   Benito Mccreedy, OTR/L, CHT 02/06/23

## 2022-12-08 ENCOUNTER — Other Ambulatory Visit: Payer: Self-pay

## 2022-12-08 ENCOUNTER — Other Ambulatory Visit (HOSPITAL_COMMUNITY): Payer: Self-pay

## 2022-12-08 ENCOUNTER — Other Ambulatory Visit: Payer: Self-pay | Admitting: Family Medicine

## 2022-12-08 MED ORDER — RESTASIS 0.05 % OP EMUL
1.0000 [drp] | Freq: Two times a day (BID) | OPHTHALMIC | 1 refills | Status: AC
  Filled 2022-12-08: qty 60, 30d supply, fill #0
  Filled 2023-04-16 – 2023-10-10 (×5): qty 60, 30d supply, fill #1

## 2022-12-12 NOTE — Therapy (Incomplete)
OUTPATIENT OCCUPATIONAL THERAPY TREATMENT NOTE  Patient Name: [name] MRN: [id number] DOB:1961-02-22, 62 y.o., female Today's Date: 12/12/2022  PCP: Roma Schanz, DO REFERRING PROVIDER:  Gregor Hams, MD    END OF SESSION:     Past Medical History:  Diagnosis Date   Allergy    seasonal allergies   Anxiety    on meds   Arthritis    on meds   Depression    on meds   Dizziness 03/05/2017   Essential hypertension 03/05/2017   on meds   GERD (gastroesophageal reflux disease)    on meds   Hyperlipidemia 03/05/2017   on meds   Infertility, female    Migraines    Osteopenia    Past Surgical History:  Procedure Laterality Date   ANKLE SURGERY Left 1979   COLONOSCOPY  2011   MS-F/V-moviprep(exc)-normal -10 yr recall   SHOULDER SURGERY Left 1990   Patient Active Problem List   Diagnosis Date Noted   Distal radius fracture, left 08/16/2022   Depression, major, single episode, severe ([place]) 09/02/2021   Preventative health care 09/02/2021   Strain of lumbar region 07/29/2021   Chronic migraine w/o aura w/o status migrainosus, not intractable 12/24/2020   Trigger finger, acquired 12/15/2019   Chronic migraine 07/05/2018   Stress at home 11/09/2017   Essential hypertension 03/05/2017   Dizziness 03/05/2017   Hyperlipidemia 03/05/2017   Plantar fasciitis of right foot 02/02/2016   Chronic rhinitis 10/20/2015   Cough, persistent 10/20/2015   Neck pain 03/16/2015   Overweight 01/22/2015   Depression with anxiety 07/14/2014   Insomnia 05/28/2012   Migraine 05/28/2012   Muscle spasm 05/28/2012   HEADACHE 04/07/2009    ONSET DATE: DOI: 08/12/22  REFERRING DIAG:  D62.229N (ICD-10-CM) - Other closed fracture of distal end of left radius, initial encounter  M25.632 (ICD-10-CM) - Stiffness of left wrist joint    THERAPY DIAG:  No diagnosis found.  Rationale for Evaluation and Treatment: Rehabilitation  PERTINENT HISTORY: ~15 weeks post fx now. She  slipped and fell at a concert on 08/19/91, breaking Lt wrist. She has had conservative tx so far. Per MD's last notes, her fx site is fairly well healed now.  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 She states pushing with Lt arm to get out of bed of to stand is painful, carrying her work laptop is painful, but she's been wearing pre-fab wrist cock-up brace. Also having difficulty cooking, opening jars, etc. "  PRECAUTIONS: None; WEIGHT BEARING RESTRICTIONS: WBAT to Lt wrist    SUBJECTIVE:   SUBJECTIVE STATEMENT: She states ***  feeling lije the pain is the same, but also not doing HEP consistently since last seen.    PAIN:  Are you having pain?  Yes in Lt wrist *** Rating:  2-3/10 at rest now, up to  3/10 at worst in past week, carrying laptop   PATIENT GOALS: To have less pain in left wrist    OBJECTIVE: (All objective assessments below are from initial evaluation on: 11/21/22 unless otherwise specified.)   HAND DOMINANCE: Right   ADLs: Overall ADLs: States decreased ability to grab, hold household objects, pain and inability to open containers, perform FMS tasks (manipulate fasteners on clothing)   FUNCTIONAL OUTCOME MEASURES: Eval: Patient Rated Wrist Evaluation (PRWE): Pain: 10/50; Function: 11.5/50; Total Score: 21.5/100 (Higher Score  =  More Pain and/or Debility)  UPPER EXTREMITY ROM     Shoulder to Wrist AROM Left eval Lt 11/30/22 Lt 12/14/22  Forearm supination 75    Forearm pronation  77    Wrist flexion 57 (87* opposite)  56 ***  Wrist extension 53 (77* opposite) 60 ***  Wrist ulnar deviation 27    Wrist radial deviation 12    Functional dart thrower's motion (F-DTM) in ulnar flexion 44    F-DTM in radial extension  67    (Blank rows = not tested)   Hand AROM Left TBD PRN  Full Fist Ability (or Gap to Distal Palmar Crease)   Thumb Opposition  (Kapandji Scale)    Long MCP  (0-90)    Long PIP (0-100)    Long DIP (0-70)    (Blank rows = not tested)   UPPER EXTREMITY MMT:     MMT Left TBD  Forearm supination 4-/5  Forearm pronation 4-/5  Wrist flexion 3-/5 MMT  Wrist extension 3-/5 MMT  Wrist ulnar deviation   Wrist radial deviation   (Blank rows = not tested)  HAND FUNCTION: Eval: Observed weakness in affected hand.  Grip strength Right: 61 lbs, Left: 50 lbs   COORDINATION: Eval: Observed coordination impairments with affected hand. Box and Blocks Test: Lt 58 Blocks today (62 is WFL)  EDEMA:   Eval:  Mildly swollen in Lt wrist today, as well as Lt MF.   OBSERVATIONS:   Eval: TTP slightly to distal dorsal radius, no overt DRUJ instability or ulnar sided pain.   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  (Overt Heberden's and Bouchard's nodes)   TODAY'S TREATMENT:  12/14/22: ***Check new home exercises including isometric strength at the wrist, upgrade to concentric strength as appropriate.  Check goals check range of motion etc.   12/07/22: OT fabricated new custom wrist strap as she states losing hers, and relying upon it because it helps decrease her pain.  OT also recommends a pair of arthritis gloves for her as she states that her left middle finger is more stiff and sometimes painful with grip now and has obvious deformities from arthritis.  OT then reviews initial home exercise program with her as she had not been doing it consistently.  After which she states her pain goes down from a 3 out of 10 to a 1.5 out of 10.  OT then upgrades her to light isometric strengthening at the wrist in flexion extension and ulnar and radial radial deviation.  She tolerates these with 10-second holds 3 times each with pain actually decreasing.  At the end of treatment she states "I really do feel better."  OT encourages her to be consistent with home program at least once every morning  if not twice a day as able.  Exercises - Turn [employer] Facing Up & Down  - 4-6 x daily - 10-15 reps - Bend and Pull Back Wrist SLOWLY  - 4 x daily - 10-15 reps - "Windshield Wipers"   - 4 x daily - 10-15 reps - Wrist Flexion Stretch  - 4 x daily - 3-5 reps - 15 sec hold - Wrist Prayer Stretch  - 4 x daily - 3-5 reps - 15 sec hold - Seated Wrist Ulnar Deviation Stretch  - 3-5 x daily - 3-5 reps - 15 sec hold - Seated Wrist Radial Deviation Stretch  - 3-5 x daily - 3-5 reps -  15 hold - Seated Isometric Wrist Radial Deviation with Manual Resistance  - 4-6 x daily - 1 sets - 10-15 reps - Seated Isometric Wrist Flexion Neutral  - 4-6 x daily - 1 sets - 10-15 reps   PATIENT EDUCATION: Education details: See tx section above for details  Person educated: Patient Education method: Verbal Instruction, Teach back, Handouts  Education comprehension: States and demonstrates understanding, Additional Education required    HOME EXERCISE PROGRAM: Access Code: [email or web address] URL: [email or web address] Date: 11/21/2022 Prepared by: Benito Mccreedy   GOALS: Goals reviewed with patient? Yes   SHORT TERM GOALS: (STG required if POC>30 days) Target Date: 12/08/22  Pt will obtain protective, custom orthotic. Goal status:  MET   2.  Pt will demo/state understanding of initial HEP to improve pain levels and prerequisite motion. Goal status: Progressing   LONG TERM GOALS: Target Date: 01/05/23   Pt will improve functional ability by decreased impairment per PRWE assessment from 21.5/100 to 10/100 or better, for better quality of life. Goal status: INITIAL  2.  Pt will improve A/ROM in Lt wrist flex/ext from 57/53, respectively to at least 70* each, to have functional motion for tasks like reach and grasp.  Goal status: INITIAL  4.  Pt will improve strength in Lt wrist ext from 3-/5 MMT to at least 4+/5 MMT to have increased functional ability to carry out selfcare and higher-level  homecare tasks with no difficulty. Goal status: INITIAL  5.  Pt will improve coordination skills in Lt arm, as seen by [facility] score on BBT testing to have increased functional ability to carry out fine motor tasks (fasteners, etc.) and more complex, coordinated IADLs (meal prep, sports, etc.).  Goal status: INITIAL  6.  Pt will decrease pain at rest from ~2/10 to 0/10 or better to have better sleep and occupational participation in daily roles. Goal status: INITIAL   ASSESSMENT:  CLINICAL IMPRESSION: 12/14/22: ***  12/07/22: She definitely got a benefit from treatment today, but has not been consistent at home with exercises yet.  She was encouraged to be consistent if she wishes to make a change in her life and her symptoms.  She states understanding. We will try to upgrade strength and functional ability neck session as tolerated   PLAN:  OT FREQUENCY: 1-2x/week  OT DURATION: 6 weeks (through 01/05/23 as needed)   PLANNED INTERVENTIONS: self care/ADL training, therapeutic exercise, therapeutic activity, neuromuscular re-education, manual therapy, passive range of motion, splinting, electrical stimulation, ultrasound, fluidotherapy, compression bandaging, moist heat, cryotherapy, contrast bath, patient/family education, and coping strategies training  CONSULTED AND AGREED WITH PLAN OF CARE: Patient  PLAN FOR NEXT SESSION:  ***    Benito Mccreedy, OTR/L, CHT 12/12/2022, 9:29 AM

## 2022-12-14 ENCOUNTER — Encounter: Payer: Commercial Managed Care - PPO | Admitting: Rehabilitative and Restorative Service Providers"

## 2022-12-18 ENCOUNTER — Other Ambulatory Visit (HOSPITAL_COMMUNITY): Payer: Self-pay

## 2023-01-12 ENCOUNTER — Encounter: Payer: Self-pay | Admitting: Physician Assistant

## 2023-01-12 ENCOUNTER — Other Ambulatory Visit (HOSPITAL_COMMUNITY): Payer: Self-pay

## 2023-01-12 ENCOUNTER — Ambulatory Visit (INDEPENDENT_AMBULATORY_CARE_PROVIDER_SITE_OTHER): Payer: No Typology Code available for payment source | Admitting: Physician Assistant

## 2023-01-12 VITALS — BP 133/84 | HR 65

## 2023-01-12 DIAGNOSIS — M542 Cervicalgia: Secondary | ICD-10-CM

## 2023-01-12 DIAGNOSIS — G43709 Chronic migraine without aura, not intractable, without status migrainosus: Secondary | ICD-10-CM | POA: Diagnosis not present

## 2023-01-12 DIAGNOSIS — M62838 Other muscle spasm: Secondary | ICD-10-CM

## 2023-01-12 DIAGNOSIS — G43009 Migraine without aura, not intractable, without status migrainosus: Secondary | ICD-10-CM | POA: Diagnosis not present

## 2023-01-12 MED ORDER — ONABOTULINUMTOXINA 100 UNITS IJ SOLR
100.0000 [IU] | Freq: Once | INTRAMUSCULAR | Status: AC
  Administered 2023-01-12: 100 [IU] via INTRAMUSCULAR

## 2023-01-12 MED ORDER — BUTALBITAL-APAP-CAFFEINE 50-300-40 MG PO CAPS
1.0000 | ORAL_CAPSULE | Freq: Three times a day (TID) | ORAL | 1 refills | Status: DC | PRN
  Filled 2023-01-12: qty 30, 5d supply, fill #0
  Filled 2023-03-15: qty 30, 5d supply, fill #1

## 2023-01-12 NOTE — Progress Notes (Signed)
S: Pt in office today for Botox injections. Her Botox has been working very well for migraine prevention.   O: BP 133/84   Pulse 65    Botox Procedure Note Vial of Botox was : Sent to office direct from pharmacy  Botox Dosing by [facility] for Chronic Migraine   Injection Sites for Migraines  Botox 100 units was injected using the dosage in the table above in the pattern shown above.  A: Migraine  Muscle spasm   P: Botox 100 units injected today.  Meds refilled as needed RTC 3 Months.

## 2023-01-18 ENCOUNTER — Other Ambulatory Visit (HOSPITAL_COMMUNITY): Payer: Self-pay

## 2023-01-18 ENCOUNTER — Other Ambulatory Visit (HOSPITAL_BASED_OUTPATIENT_CLINIC_OR_DEPARTMENT_OTHER): Payer: Self-pay | Admitting: Cardiology

## 2023-01-18 MED ORDER — ROSUVASTATIN CALCIUM 40 MG PO TABS
40.0000 mg | ORAL_TABLET | Freq: Every day | ORAL | 0 refills | Status: DC
  Filled 2023-01-18: qty 90, 90d supply, fill #0

## 2023-01-18 NOTE — Telephone Encounter (Signed)
Rx request sent to pharmacy.  

## 2023-01-19 ENCOUNTER — Other Ambulatory Visit: Payer: Self-pay

## 2023-01-24 NOTE — Progress Notes (Signed)
Cardiology Office Note  Date:  01/26/2023   ID:  SHOMARI KAUK, DOB Nov 16, 1961, MRN [id number]  PCP:  Carollee Herter, Alferd Apa, DO  Cardiologist:   Skeet Latch, MD   No chief complaint on file.    History of Present Illness: [name] is a 62 y.o. female with hypertension and hyperlipidemia who presents for follow up.  She was first seen 01/2017 for an evaluation of chest pain and an abnormal EKG.  She was referred for an [place] that revealed LVEF 53% and no ischemia. She also reported dizziness and her heart rate was noted to be 45 bpm.  Metoprolol was switched to amlodipine.  She reported high blood pressures and it was recommended that she reduce alcohol and Sudafed use.  Based on her lipid profile she was started on rosuvastatin 40 mg daily.  Her evening blood pressures were elevated so amlodipine was split into 2.5 mg twice daily.  Since making that change of blood pressure has been very well-controlled and she only had one elevated reading.   2019 was a stressful year for [name].  Her sister died of an overdose.  Her husband left her one year ago.  She struggled with heavy alcohol use and anxiety.  She was treated for two months and is doing much better now.  She had been doing well physically. Her blood pressure was elevated but she was under a lot of stress at the time but she was otherwise doing well.   At her last appointment her BP was slightly above goal in the setting of a migraine. Blood pressure was typically well controlled otherwise. She had recently been started on Zetia for hypertriglyceridemia. She reported mild exertional chest pain with walking uphill. She successfully cut back on her alcohol consumption. On 08/13/2022 she was seen in urgent care for a closed fracture of distal end of left radius. She had slipped on wet grass and fallen on her left wrist. She was again seen in urgent care 11/28/2022 with severe dizziness in the setting of migraine. Manual  blood pressure was 80/49 with a pulse in the 40s. She was given 2 L of IV fluids. It was noted that she took multiple prescribed migraine medications which caused a reaction resulting in hypotension, bradycardia, and dehydration with poor appetite.    Today, she appears well. She is bradycardic today but denies feeling lightheaded. The only times her heart rate was in the 40's is when her migraines were severe and she took too much Nurtec which she knows lowers her blood pressure. She may feel lightheaded/woozy once every couple of weeks. Usually she sits and rests when she feels this way. She denies every losing consciousness. Her main concern is working on weight loss and management of her cholesterol. Lately she has not been formally exercising, but she has recently retired after working 50 years. She has time to start being active and focusing on her health. When she is able to work out, she does become very short of breath but denies chest pain. Her SOB has been gradually worsening. At home she enjoys cooking. Generally she eats healthier than she did during her childhood, but could still make changes such as using less butter and avoiding salt. However she does maintain a vegetable and herb garden, which she uses in her meals frequently. She has not yet participated in [place]. When she flies she will wear compression stockings. Usually her LE edema is well managed. She denies any  palpitations, chest pain, syncope, orthopnea, or PND.   Past Medical History:  Diagnosis Date   Allergy    seasonal allergies   Anxiety    on meds   Arthritis    on meds   Depression    on meds   Dizziness 03/05/2017   Essential hypertension 03/05/2017   on meds   GERD (gastroesophageal reflux disease)    on meds   Hyperlipidemia 03/05/2017   on meds   Infertility, female    Migraines    Osteopenia     Past Surgical History:  Procedure Laterality Date   ANKLE SURGERY Left 1979   COLONOSCOPY  2011    MS-F/V-moviprep(exc)-normal -10 yr recall   SHOULDER SURGERY Left 1990     Current Outpatient Medications  Medication Sig Dispense Refill   amLODipine (NORVASC) 5 MG tablet Take 1 tablet (5 mg total) by mouth daily. 90 tablet 0   Ascorbic Acid (VITAMIN C PO) Take by mouth.     aspirin EC 81 MG tablet Take 81 mg by mouth daily.     baclofen (LIORESAL) 10 MG tablet TAKE 1 TABLET BY MOUTH EVERY 8 HOURS AS NEEDED 60 tablet 4   BOTOX 100 units SOLR injection INJECT 100 UNITS INTO THE MUSCLE EVERY 3 MONTHS. 1 each 3   Butalbital-APAP-Caffeine (FIORICET) 50-300-40 MG CAPS Take 1-2 capsules by mouth every 8 (eight) hours as needed. 30 capsule 1   Calcium Carbonate (CALCIUM 600 PO) Take by mouth.     celecoxib (CELEBREX) 100 MG capsule Take 1 capsule (100 mg total) by mouth 2 (two) times daily. 60 capsule 3   COVID-19 At Home Antigen Test (CARESTART COVID-19 HOME TEST) KIT Use as directed within package instructions 4 each 0   ezetimibe (ZETIA) 10 MG tablet Take 1 tablet (10 mg total) by mouth daily. 90 tablet 1   fluticasone (FLONASE) 50 MCG/ACT nasal spray Place 2 sprays into both nostrils daily. 16 g 5   Fluticasone Propionate (FLONASE NA) Place into the nose.     Galcanezumab-gnlm (EMGALITY) 120 MG/ML SOAJ INJECT 1 PEN EVERY 30 DAYS 1 mL 11   hydrOXYzine (VISTARIL) 50 MG capsule TAKE 2 CAPSULES BY MOUTH 3 TIMES DAILY AS NEEDED FOR ITCHING 180 capsule 2   ibuprofen (ADVIL) 800 MG tablet Take 1 tablet (800 mg total) by mouth every 8 (eight) hours as needed. 60 tablet 3   magnesium oxide (MAG-OX) 400 MG tablet Take 400 mg by mouth daily.     Multiple Vitamins-Minerals (ZINC PO) Take by mouth.     naratriptan (AMERGE) 2.5 MG tablet TAKE 1 TABLET BY MOUTH AT ONSET OF HEADACHE. IF HEADACHE RETURNS OR DOES NOT RESOLVE MAY REPEAT AFTER 4 HOURS. DO NOT EXCEED 2 TABLETS IN 24 HOURS 10 tablet 11   NONFORMULARY OR COMPOUNDED ITEM Gaba Calm as needed for anxiety     ondansetron (ZOFRAN) 8 MG tablet TAKE 1  TABLET BY MOUTH EVERY 8 HOURS AS NEEDED 30 tablet 6   pantoprazole (PROTONIX) 40 MG tablet Take 1 tablet (40 mg total) by mouth daily. 90 tablet 3   Probiotic Product (PROBIOTIC DAILY PO) Take by mouth.     Pseudoephedrine HCl (SUDAFED PO) Take 1 tablet by mouth daily as needed.     RESTASIS 0.05 % ophthalmic emulsion Place 1 drop into both eyes 2 times daily. 60 each 1   Rimegepant Sulfate (NURTEC) 75 MG TBDP Dissolve 1 tablet by mouth every other day. 16 tablet 11   rosuvastatin (CRESTOR)  40 MG tablet Take 1 tablet (40 mg total) by mouth daily. Please keep your upcoming appointment for refills. 90 tablet 0   sertraline (ZOLOFT) 100 MG tablet Take 1.5 tablets (150 mg total) by mouth daily. 135 tablet 1   tiZANidine (ZANAFLEX) 4 MG tablet TAKE 1 TABLET BY MOUTH 3 TIMES DAILY 90 tablet 2   topiramate (TOPAMAX) 200 MG tablet TAKE 1 TABLET BY MOUTH ONCE DAILY 30 tablet 11   traZODone (DESYREL) 50 MG tablet TAKE 1/2 TO 1 TABLET BY MOUTH AT BEDTIME AS NEEDED FOR SLEEP 30 tablet 0   UNABLE TO FIND CBD 50 mg     vitamin B-12 (CYANOCOBALAMIN) 500 MCG tablet Take 500 mcg by mouth daily.     VITAMIN D PO Take by mouth.     No current facility-administered medications for this visit.    Allergies:   Augmentin [amoxicillin-pot clavulanate]    Social History:  The patient  reports that she quit smoking about 30 years ago. Her smoking use included cigarettes. She has never used smokeless tobacco. She reports current alcohol use of about 12.0 standard drinks of alcohol per week. She reports that she does not use drugs.   Family History:  The patient's family history includes Alcohol abuse in her father and paternal grandfather; Allergic rhinitis in her mother; Cancer in her maternal grandmother; Eczema in her daughter; Heart disease in her maternal grandfather; Hyperlipidemia in her mother; Hypertension in her mother; Sleep apnea in her mother; Stroke in her father and another family member.    ROS:    Please see the history of present illness. (+) Intermittent lightheadedness (+) Shortness of breath All other systems are reviewed and negative.    PHYSICAL EXAM: VS:  BP (!) 142/86 (BP Location: Left Arm, Patient Position: Sitting, Cuff Size: Normal)   Pulse (!) 50   Ht 5' 4"$  (1.626 m)   Wt 177 lb 6.4 oz (80.5 kg)   BMI 30.45 kg/m  , BMI Body mass index is 30.45 kg/m. GENERAL:  Well appearing HEENT: Pupils equal round and reactive, fundi not visualized, oral mucosa unremarkable NECK:  No jugular venous distention, waveform within normal limits, carotid upstroke brisk and symmetric, no bruits LUNGS:  Clear to auscultation bilaterally HEART:  Bradycardic.  PMI not displaced or sustained,S1 and S2 within normal limits, no S3, no S4, no clicks, no rubs, no murmurs ABD:  Flat, positive bowel sounds normal in frequency in pitch, no bruits, no rebound, no guarding, no midline pulsatile mass, no hepatomegaly, no splenomegaly EXT:  2 plus pulses throughout, no edema, no cyanosis no clubbing SKIN:  No rashes no nodules NEURO:  Cranial nerves II through XII grossly intact, motor grossly intact throughout PSYCH:  Cognitively intact, oriented to person place and time  EKG:  EKG is personally reviewed. 01/26/2023:  Sinus bradycardia. Rate 50 bpm. 10/18/2021: Sinus bradycardia rate 59 low voltage. LAFB. 03/24/2020: Sinus rhythm.  Rate 65 bpm.  Anterior T wave inversions. 09/27/18: Sinus bradycardia.  Rate 58 bpm. 01/15/17: sinus bradycardia. Rate 45 bpm. Anterior T-wave inversions.   Exercise Myoview 02/20/17: Nuclear stress EF: 53%. The left ventricular ejection fraction is mildly decreased (45-54%) / lower limit of normal There was no ST segment deviation noted during stress. The study is normal. This is a low risk study.   Recent Labs: 09/04/2022: ALT 19; BUN 23; Creatinine, Ser 0.89; Hemoglobin 12.3; Platelets 302.0; Potassium 4.0; Sodium 139; TSH 2.17    Lipid Panel    Component  Value  Date/Time   CHOL 262 (H) 09/04/2022 1343   CHOL 189 03/24/2020 0943   TRIG 202.0 (H) 09/04/2022 1343   HDL 62.00 09/04/2022 1343   HDL 75 03/24/2020 0943   CHOLHDL 4 09/04/2022 1343   VLDL 40.4 (H) 09/04/2022 1343   LDLCALC 53 02/25/2021 0956   LDLCALC 82 03/24/2020 0943   LDLDIRECT 177.0 09/04/2022 1343      Wt Readings from Last 3 Encounters:  01/26/23 177 lb 6.4 oz (80.5 kg)  11/28/22 172 lb 13.5 oz (78.4 kg)  11/08/22 173 lb (78.5 kg)     ASSESSMENT AND PLAN:  Essential hypertension Blood pressure is mildly elevated.  She is going to really work on diet and exercise.  Will refer her to the prep program.  Recommend exercising at least 150 minutes weekly.  Continue amlodipine.  If her blood pressure remains elevated at follow-up we will titrate her regimen.  She will work on limiting the sodium in her diet.  Hyperlipidemia Lipids have been elevated.  She continues on Zetia and is tolerating it well.  Will get a coronary calcium score to better understand her lipid goal.  Working on diet and exercise as above.  Bradycardia She is bradycardic but asymptomatic.  She has intermittent lightheadedness but doesn't attribute that to the bradycardia.  She will track her BP and heart rate when she has an episode.  Continue to avoid nodal agents.   Disposition:   FU with [place] Kreitzer C. Oval Linsey, MD, [facility] in 3 months.   Medication Adjustments/Labs and Tests Ordered: Current medicines are reviewed at length with the patient today.  Concerns regarding medicines are outlined above.   Orders Placed This Encounter  Procedures   CT CARDIAC SCORING (SELF PAY ONLY)   Lipid panel   Comprehensive metabolic panel   Amb Referral To Provider Referral Exercise Program (P.R.E.P)   EKG 12-Lead   No orders of the defined types were placed in this encounter.  Patient Instructions  Medication Instructions:  Your physician recommends that you continue on your current medications as directed.  Please refer to the Current Medication list given to you today.  *If you need a refill on your cardiac medications before your next appointment, please call your pharmacy*   Lab Work: Your physician recommends that you return for a FASTING lipid profile and CMET 1 week prior to your follow-up appointment.  If you have labs (blood work) drawn today and your tests are completely normal, you will receive your results only by: [place] (if you have MyChart) OR A paper copy in the mail If you have any lab test that is abnormal or we need to change your treatment, we will call you to review the results.   Testing/Procedures:  Dr. Oval Linsey has recommended a Calcium Score to be done at [facility]. This will cost you $99 out of pocket.   Follow-Up: At [facility], you and your health needs are our priority.  As part of our continuing mission to provide you with exceptional heart care, we have created designated Provider Care Teams.  These Care Teams include your primary Cardiologist (physician) and Advanced Practice Providers (APPs -  Physician Assistants and Nurse Practitioners) who all work together to provide you with the care you need, when you need it.  We recommend signing up for the patient portal called "MyChart".  Sign up information is provided on this After Visit Summary.  MyChart is used to connect with patients for Virtual Visits (Telemedicine).  Patients are able to view lab/test results, encounter notes, upcoming appointments, etc.  Non-urgent messages can be sent to your provider as well.   To learn more about what you can do with MyChart, go to [email or web address].    Your next appointment:   3 month(s)  Provider:   Skeet Latch, MD    Other Instructions  You have been referred to PREP:      Provider Referral Exercise Program (P.R.E.P.)      12 Weeks to Wellness       What is included in the PREP?  --Health Coaching and personalized  exercise prescription --Full Membership to participating [facility] for the 12 weeks --Pre- and Post-consultations to assess progress and formulate an exercise plan for       continuation of exercise   What is my investment?  Your cost is $100 (reg. $144). This includes full membership privileges to [facility] in [place] and across the country. If you complete the post-assessment visit, any applicable enrollment costs will be waived if you decide to join the [facility].   Who will benefit from PREP?  People with challenges, including (but not limited to): ---Low back pain ---Arthritis ---Hypertension ---Diabetes ---Obesity ---Joint Replacement ---Neuromuscular Disorders ---Cancer Recovery ---Weight Loss ---Many others (as determined by provider)   Get Started Today! Ask your healthcare provider to fill out a Healthcare Provider Referral for Exercise form. Be sure to turn it in before you leave the office and send/fax/email it directly to the Wellness RN to schedule your Initial Consultation. If you have family or friends that would also benefit, please share this referral with them.   Contacts:  [facility] [phone]    Winifred Olive, Wellness RN  [phone]  [email or web address]     Tips to Measure your Blood Pressure Correctly  To determine whether you have hypertension, a medical professional will take a blood pressure reading. How you prepare for the test, the position of your arm, and other factors can change a blood pressure reading by 10% or more. That could be enough to hide high blood pressure, start you on a drug you don't really need, or lead your doctor to incorrectly adjust your medications.  National and international guidelines offer specific instructions for measuring blood pressure. If a doctor, nurse, or medical assistant isn't doing it right, don't hesitate to ask him or her to get with the guidelines.  Here's what you can do to ensure a correct reading:   Don't drink a caffeinated beverage or smoke during the 30 minutes before the test.  Sit quietly for five minutes before the test begins.  During the measurement, sit in a chair with your feet on the floor and your arm supported so your elbow is at about heart level.  The inflatable part of the cuff should completely cover at least 80% of your upper arm, and the cuff should be placed on bare skin, not over a shirt.  Don't talk during the measurement.  Have your blood pressure measured twice, with a brief break in between. If the readings are different by 5 points or more, have it done a third time.  In 2017, new guidelines from the [place], the [employer] of Cardiology, and nine other health organizations lowered the diagnosis of high blood pressure to 130/80 mm Hg or higher for all adults. The guidelines also redefined the various blood pressure categories to now include normal, elevated, Stage 1 hypertension, Stage 2 hypertension, and hypertensive crisis (see "Blood pressure  categories").  Blood pressure categories  Blood pressure category SYSTOLIC (upper number)  DIASTOLIC (lower number)  Normal Less than 120 mm Hg and Less than 80 mm Hg  Elevated 120-129 mm Hg and Less than 80 mm Hg  High blood pressure: Stage 1 hypertension 130-139 mm Hg or 80-89 mm Hg  High blood pressure: Stage 2 hypertension 140 mm Hg or higher or 90 mm Hg or higher  Hypertensive crisis (consult your doctor immediately) Higher than 180 mm Hg and/or Higher than 120 mm Hg  Source: American Heart Association and American Stroke Association. For more on getting your blood pressure under control, buy Controlling Your Blood Pressure, a Special Health Report from [facility].   Blood Pressure Log   Date   Time  Blood Pressure  Position  Example: Nov 1 9 AM 124/78 sitting                                                        I,Mathew Stumpf,acting as a Education administrator  for [place], MD.,have documented all relevant documentation on the behalf of Skeet Latch, MD,as directed by  Skeet Latch, MD while in the presence of Skeet Latch, MD.  I, [place] Oval Linsey, MD have reviewed all documentation for this visit.  The documentation of the exam, diagnosis, procedures, and orders on 01/26/2023 are all accurate and complete.  Signed, Heavenlee Maiorana C. Oval Linsey, MD, [facility]  01/26/2023 9:50 AM    [place]

## 2023-01-26 ENCOUNTER — Encounter (HOSPITAL_BASED_OUTPATIENT_CLINIC_OR_DEPARTMENT_OTHER): Payer: Self-pay | Admitting: Cardiovascular Disease

## 2023-01-26 ENCOUNTER — Ambulatory Visit (HOSPITAL_BASED_OUTPATIENT_CLINIC_OR_DEPARTMENT_OTHER): Payer: No Typology Code available for payment source | Admitting: Cardiovascular Disease

## 2023-01-26 ENCOUNTER — Other Ambulatory Visit (HOSPITAL_COMMUNITY): Payer: Self-pay

## 2023-01-26 ENCOUNTER — Telehealth: Payer: Self-pay | Admitting: Cardiovascular Disease

## 2023-01-26 VITALS — BP 142/86 | HR 50 | Ht 64.0 in | Wt 177.4 lb

## 2023-01-26 DIAGNOSIS — I10 Essential (primary) hypertension: Secondary | ICD-10-CM | POA: Diagnosis not present

## 2023-01-26 DIAGNOSIS — E785 Hyperlipidemia, unspecified: Secondary | ICD-10-CM | POA: Diagnosis not present

## 2023-01-26 DIAGNOSIS — R001 Bradycardia, unspecified: Secondary | ICD-10-CM | POA: Diagnosis not present

## 2023-01-26 MED ORDER — EZETIMIBE 10 MG PO TABS
10.0000 mg | ORAL_TABLET | Freq: Every day | ORAL | 3 refills | Status: DC
  Filled 2023-01-26 – 2023-04-25 (×3): qty 90, 90d supply, fill #0
  Filled 2023-07-27 – 2023-07-30 (×2): qty 90, 90d supply, fill #1
  Filled 2023-11-15: qty 90, 90d supply, fill #2
  Filled 2024-01-14: qty 90, 90d supply, fill #3

## 2023-01-26 MED ORDER — ROSUVASTATIN CALCIUM 40 MG PO TABS
40.0000 mg | ORAL_TABLET | Freq: Every day | ORAL | 3 refills | Status: DC
  Filled 2023-01-26 – 2023-04-25 (×3): qty 90, 90d supply, fill #0
  Filled 2023-07-27 – 2023-07-30 (×2): qty 90, 90d supply, fill #1
  Filled 2023-11-15: qty 90, 90d supply, fill #2
  Filled 2024-01-14: qty 90, 90d supply, fill #3

## 2023-01-26 MED ORDER — AMLODIPINE BESYLATE 5 MG PO TABS
5.0000 mg | ORAL_TABLET | Freq: Every day | ORAL | 3 refills | Status: DC
  Filled 2023-01-26 – 2023-04-25 (×3): qty 90, 90d supply, fill #0
  Filled 2023-07-27 – 2023-07-30 (×2): qty 90, 90d supply, fill #1

## 2023-01-26 NOTE — Assessment & Plan Note (Signed)
Lipids have been elevated.  She continues on Zetia and is tolerating it well.  Will get a coronary calcium score to better understand her lipid goal.  Working on diet and exercise as above.

## 2023-01-26 NOTE — Assessment & Plan Note (Addendum)
Blood pressure is mildly elevated.  She is going to really work on diet and exercise.  Will refer her to the prep program.  Recommend exercising at least 150 minutes weekly.  Continue amlodipine.  If her blood pressure remains elevated at follow-up we will titrate her regimen.  She will work on limiting the sodium in her diet.

## 2023-01-26 NOTE — Telephone Encounter (Signed)
*  STAT* If patient is at the pharmacy, call can be transferred to refill team.   1. Which medications need to be refilled? (please list name of each medication and dose if known)  amLODipine (NORVASC) 5 MG tablet  rosuvastatin (CRESTOR) 40 MG tablet  ezetimibe (ZETIA) 10 MG tablet   2. Which pharmacy/location (including street and city if local pharmacy) is medication to be sent to? [place]   3. Do they need a 30 day or 90 day supply? 90 day

## 2023-01-26 NOTE — Patient Instructions (Addendum)
Medication Instructions:  Your physician recommends that you continue on your current medications as directed. Please refer to the Current Medication list given to you today.  *If you need a refill on your cardiac medications before your next appointment, please call your pharmacy*   Lab Work: Your physician recommends that you return for a FASTING lipid profile and CMET 1 week prior to your follow-up appointment.  If you have labs (blood work) drawn today and your tests are completely normal, you will receive your results only by: [place] (if you have MyChart) OR A paper copy in the mail If you have any lab test that is abnormal or we need to change your treatment, we will call you to review the results.   Testing/Procedures:  Dr. Oval Linsey has recommended a Calcium Score to be done at [facility]. This will cost you $99 out of pocket.   Follow-Up: At [facility], you and your health needs are our priority.  As part of our continuing mission to provide you with exceptional heart care, we have created designated Provider Care Teams.  These Care Teams include your primary Cardiologist (physician) and Advanced Practice Providers (APPs -  Physician Assistants and Nurse Practitioners) who all work together to provide you with the care you need, when you need it.  We recommend signing up for the patient portal called "MyChart".  Sign up information is provided on this After Visit Summary.  MyChart is used to connect with patients for Virtual Visits (Telemedicine).  Patients are able to view lab/test results, encounter notes, upcoming appointments, etc.  Non-urgent messages can be sent to your provider as well.   To learn more about what you can do with MyChart, go to [email or web address].    Your next appointment:   3 month(s)  Provider:   Skeet Latch, MD    Other Instructions  You have been referred to PREP:      Provider Referral Exercise Program  (P.R.E.P.)      12 Weeks to Wellness       What is included in the PREP?  --Health Coaching and personalized exercise prescription --Full Membership to participating [facility] for the 12 weeks --Pre- and Post-consultations to assess progress and formulate an exercise plan for       continuation of exercise   What is my investment?  Your cost is $100 (reg. $144). This includes full membership privileges to [facility] in [place] and across the country. If you complete the post-assessment visit, any applicable enrollment costs will be waived if you decide to join the [facility].   Who will benefit from PREP?  People with challenges, including (but not limited to): ---Low back pain ---Arthritis ---Hypertension ---Diabetes ---Obesity ---Joint Replacement ---Neuromuscular Disorders ---Cancer Recovery ---Weight Loss ---Many others (as determined by provider)   Get Started Today! Ask your healthcare provider to fill out a Healthcare Provider Referral for Exercise form. Be sure to turn it in before you leave the office and send/fax/email it directly to the Wellness RN to schedule your Initial Consultation. If you have family or friends that would also benefit, please share this referral with them.   Contacts:  [facility] [phone]    Winifred Olive, Wellness RN  [phone]  [email or web address]     Tips to Measure your Blood Pressure Correctly  To determine whether you have hypertension, a medical professional will take a blood pressure reading. How you prepare for the test, the position of your arm, and other factors can  change a blood pressure reading by 10% or more. That could be enough to hide high blood pressure, start you on a drug you don't really need, or lead your doctor to incorrectly adjust your medications.  National and international guidelines offer specific instructions for measuring blood pressure. If a doctor, nurse, or medical assistant isn't doing it right, don't  hesitate to ask him or her to get with the guidelines.  Here's what you can do to ensure a correct reading:  Don't drink a caffeinated beverage or smoke during the 30 minutes before the test.  Sit quietly for five minutes before the test begins.  During the measurement, sit in a chair with your feet on the floor and your arm supported so your elbow is at about heart level.  The inflatable part of the cuff should completely cover at least 80% of your upper arm, and the cuff should be placed on bare skin, not over a shirt.  Don't talk during the measurement.  Have your blood pressure measured twice, with a brief break in between. If the readings are different by 5 points or more, have it done a third time.  In 2017, new guidelines from the [employer], the [employer] of Cardiology, and nine other health organizations lowered the diagnosis of high blood pressure to 130/80 mm Hg or higher for all adults. The guidelines also redefined the various blood pressure categories to now include normal, elevated, Stage 1 hypertension, Stage 2 hypertension, and hypertensive crisis (see "Blood pressure categories").  Blood pressure categories  Blood pressure category SYSTOLIC (upper number)  DIASTOLIC (lower number)  Normal Less than 120 mm Hg and Less than 80 mm Hg  Elevated 120-129 mm Hg and Less than 80 mm Hg  High blood pressure: Stage 1 hypertension 130-139 mm Hg or 80-89 mm Hg  High blood pressure: Stage 2 hypertension 140 mm Hg or higher or 90 mm Hg or higher  Hypertensive crisis (consult your doctor immediately) Higher than 180 mm Hg and/or Higher than 120 mm Hg  Source: American Heart Association and American Stroke Association. For more on getting your blood pressure under control, buy Controlling Your Blood Pressure, a Special Health Report from [facility].   Blood Pressure Log   Date   Time  Blood Pressure  Position  Example: Nov 1 9 AM 124/78 sitting

## 2023-01-26 NOTE — Assessment & Plan Note (Signed)
She is bradycardic but asymptomatic.  She has intermittent lightheadedness but doesn't attribute that to the bradycardia.  She will track her BP and heart rate when she has an episode.  Continue to avoid nodal agents.

## 2023-01-26 NOTE — Telephone Encounter (Signed)
Rx request sent to pharmacy.  

## 2023-01-30 ENCOUNTER — Other Ambulatory Visit (HOSPITAL_COMMUNITY): Payer: Self-pay

## 2023-01-31 ENCOUNTER — Other Ambulatory Visit (HOSPITAL_BASED_OUTPATIENT_CLINIC_OR_DEPARTMENT_OTHER): Payer: Self-pay

## 2023-01-31 ENCOUNTER — Ambulatory Visit (INDEPENDENT_AMBULATORY_CARE_PROVIDER_SITE_OTHER): Payer: No Typology Code available for payment source | Admitting: Family

## 2023-01-31 VITALS — BP 135/89 | HR 79 | Temp 97.9°F | Resp 16 | Wt 174.0 lb

## 2023-01-31 DIAGNOSIS — J019 Acute sinusitis, unspecified: Secondary | ICD-10-CM

## 2023-01-31 MED ORDER — PROMETHAZINE-DM 6.25-15 MG/5ML PO SYRP
5.0000 mL | ORAL_SOLUTION | Freq: Four times a day (QID) | ORAL | 0 refills | Status: DC | PRN
  Filled 2023-01-31: qty 180, 9d supply, fill #0

## 2023-01-31 MED ORDER — DOXYCYCLINE HYCLATE 100 MG PO TABS
100.0000 mg | ORAL_TABLET | Freq: Two times a day (BID) | ORAL | 0 refills | Status: DC
  Filled 2023-01-31: qty 20, 10d supply, fill #0

## 2023-01-31 NOTE — Assessment & Plan Note (Signed)
New. Has done well previously with doxycycline. Augmentin causes diarrhea.  She is also coughing. Will rx with promethazine DM cough syrup. Pt is advised to call if symptoms worsen or if symptoms do not improve.

## 2023-01-31 NOTE — Progress Notes (Signed)
Subjective:   By signing my name below, I, [name], attest that this documentation has been prepared under the direction and in the presence of Alexandria Alar, NP. 01/31/2023   Patient ID: [name], female    DOB: 1961/08/12, 62 y.o.   MRN: [id number]  Chief Complaint  Patient presents with   Otalgia    Complains of bilateral ear pain   Nasal Congestion    Complains of nasal congestion with sinus pressure   Headache    Complains of headache    HPI Patient is in today for an office visit.   She complains of pain behind her eyes, coughing, rhinorrhea. Otalgia, sinus pain, and headache. She denies having a sore throat. She is experiencing symptoms for the past 7 days. She is taking Mucinex and tylenol to manage her symptoms but finds no relief. She has tested negative for Covid-19 twice over the past week.    Past Medical History:  Diagnosis Date   Allergy    seasonal allergies   Anxiety    on meds   Arthritis    on meds   Depression    on meds   Dizziness 03/05/2017   Essential hypertension 03/05/2017   on meds   GERD (gastroesophageal reflux disease)    on meds   Hyperlipidemia 03/05/2017   on meds   Infertility, female    Migraines    Osteopenia     Past Surgical History:  Procedure Laterality Date   ANKLE SURGERY Left 1979   COLONOSCOPY  2011   MS-F/V-moviprep(exc)-normal -73 yr recall   SHOULDER SURGERY Left 1990    Family History  Problem Relation Age of Onset   Hypertension Mother    Allergic rhinitis Mother    Sleep apnea Mother    Hyperlipidemia Mother    Cancer Maternal Grandmother    Stroke Other    Eczema Daughter    Alcohol abuse Father    Stroke Father    Heart disease Maternal Grandfather    Alcohol abuse Paternal Grandfather    Colon polyps Neg Hx    Colon cancer Neg Hx    Esophageal cancer Neg Hx    Rectal cancer Neg Hx    Stomach cancer Neg Hx     Social History   Socioeconomic History   Marital status: Legally  Separated    Spouse name: Not on file   Number of children: Not on file   Years of education: Not on file   Highest education level: Not on file  Occupational History    Employer: [employer]    Comment: [employer]  Tobacco Use   Smoking status: Former    Types: Cigarettes    Quit date: 02/09/1992    Years since quitting: 30.9   Smokeless tobacco: Never  Vaping Use   Vaping Use: Never used  Substance and Sexual Activity   Alcohol use: Yes    Alcohol/week: 12.0 standard drinks of alcohol    Types: 12 Standard drinks or equivalent per week   Drug use: No   Sexual activity: Yes    Partners: Male  Other Topics Concern   Not on file  Social History Narrative   Not on file   Social Determinants of Health   Financial Resource Strain: Not on file  Food Insecurity: Not on file  Transportation Needs: Not on file  Physical Activity: Not on file  Stress: Not on file  Social Connections: Not on file  Intimate  Partner Violence: Not on file    Outpatient Medications Prior to Visit  Medication Sig Dispense Refill   amLODipine (NORVASC) 5 MG tablet Take 1 tablet (5 mg total) by mouth daily. 90 tablet 3   Ascorbic Acid (VITAMIN C PO) Take by mouth.     aspirin EC 81 MG tablet Take 81 mg by mouth daily.     baclofen (LIORESAL) 10 MG tablet TAKE 1 TABLET BY MOUTH EVERY 8 HOURS AS NEEDED 60 tablet 4   BOTOX 100 units SOLR injection INJECT 100 UNITS INTO THE MUSCLE EVERY 3 MONTHS. 1 each 3   Butalbital-APAP-Caffeine (FIORICET) 50-300-40 MG CAPS Take 1-2 capsules by mouth every 8 (eight) hours as needed. 30 capsule 1   Calcium Carbonate (CALCIUM 600 PO) Take by mouth.     celecoxib (CELEBREX) 100 MG capsule Take 1 capsule (100 mg total) by mouth 2 (two) times daily. 60 capsule 3   COVID-19 At Home Antigen Test (CARESTART COVID-19 HOME TEST) KIT Use as directed within package instructions 4 each 0   ezetimibe (ZETIA) 10 MG tablet Take 1 tablet (10 mg total) by mouth daily. 90  tablet 3   fluticasone (FLONASE) 50 MCG/ACT nasal spray Place 2 sprays into both nostrils daily. 16 g 5   Fluticasone Propionate (FLONASE NA) Place into the nose.     Galcanezumab-gnlm (EMGALITY) 120 MG/ML SOAJ INJECT 1 PEN EVERY 30 DAYS 1 mL 11   hydrOXYzine (VISTARIL) 50 MG capsule TAKE 2 CAPSULES BY MOUTH 3 TIMES DAILY AS NEEDED FOR ITCHING 180 capsule 2   ibuprofen (ADVIL) 800 MG tablet Take 1 tablet (800 mg total) by mouth every 8 (eight) hours as needed. 60 tablet 3   magnesium oxide (MAG-OX) 400 MG tablet Take 400 mg by mouth daily.     Multiple Vitamins-Minerals (ZINC PO) Take by mouth.     naratriptan (AMERGE) 2.5 MG tablet TAKE 1 TABLET BY MOUTH AT ONSET OF HEADACHE. IF HEADACHE RETURNS OR DOES NOT RESOLVE MAY REPEAT AFTER 4 HOURS. DO NOT EXCEED 2 TABLETS IN 24 HOURS 10 tablet 11   NONFORMULARY OR COMPOUNDED ITEM Gaba Calm as needed for anxiety     ondansetron (ZOFRAN) 8 MG tablet TAKE 1 TABLET BY MOUTH EVERY 8 HOURS AS NEEDED 30 tablet 6   pantoprazole (PROTONIX) 40 MG tablet Take 1 tablet (40 mg total) by mouth daily. 90 tablet 3   Probiotic Product (PROBIOTIC DAILY PO) Take by mouth.     Pseudoephedrine HCl (SUDAFED PO) Take 1 tablet by mouth daily as needed.     RESTASIS 0.05 % ophthalmic emulsion Place 1 drop into both eyes 2 times daily. 60 each 1   Rimegepant Sulfate (NURTEC) 75 MG TBDP Dissolve 1 tablet by mouth every other day. 16 tablet 11   rosuvastatin (CRESTOR) 40 MG tablet Take 1 tablet (40 mg total) by mouth daily. Please keep your upcoming appointment for refills. 90 tablet 3   sertraline (ZOLOFT) 100 MG tablet Take 1.5 tablets (150 mg total) by mouth daily. 135 tablet 1   tiZANidine (ZANAFLEX) 4 MG tablet TAKE 1 TABLET BY MOUTH 3 TIMES DAILY 90 tablet 2   topiramate (TOPAMAX) 200 MG tablet TAKE 1 TABLET BY MOUTH ONCE DAILY 30 tablet 11   traZODone (DESYREL) 50 MG tablet TAKE 1/2 TO 1 TABLET BY MOUTH AT BEDTIME AS NEEDED FOR SLEEP 30 tablet 0   UNABLE TO FIND CBD 50 mg      vitamin B-12 (CYANOCOBALAMIN) 500 MCG tablet Take 500  mcg by mouth daily.     VITAMIN D PO Take by mouth.     No facility-administered medications prior to visit.    Allergies  Allergen Reactions   Augmentin [Amoxicillin-Pot Clavulanate]     diarrhea    Review of Systems  HENT:  Positive for sinus pain. Negative for sore throat.        (+) sinus tenderness  Respiratory:  Positive for cough.   Neurological:  Positive for headaches.       Objective:    Physical Exam Constitutional:      General: She is not in acute distress.    Appearance: Normal appearance.  HENT:     Head: Normocephalic and atraumatic.     Right Ear: External ear normal.     Left Ear: External ear normal.     Ears:     Comments: (+) left ear retracted      Nose: Congestion and rhinorrhea present.     Right Sinus: Maxillary sinus tenderness and frontal sinus tenderness present.     Left Sinus: Maxillary sinus tenderness and frontal sinus tenderness present.     Mouth/Throat:     Mouth: Mucous membranes are moist.  Eyes:     Extraocular Movements: Extraocular movements intact.     Pupils: Pupils are equal, round, and reactive to light.  Cardiovascular:     Rate and Rhythm: Normal rate and regular rhythm.     Heart sounds: Normal heart sounds. No murmur heard.    No gallop.  Pulmonary:     Effort: No respiratory distress.     Breath sounds: Normal breath sounds. No wheezing or rales.  Skin:    General: Skin is warm.  Neurological:     Mental Status: She is alert and oriented to person, place, and time.  Psychiatric:        Judgment: Judgment normal.     BP 135/89 (BP Location: Right Arm, Patient Position: Sitting, Cuff Size: Small)   Pulse 79   Temp 97.9 F (36.6 C) (Oral)   Resp 16   Wt 174 lb (78.9 kg)   SpO2 99%   BMI 29.87 kg/m  Wt Readings from Last 3 Encounters:  01/31/23 174 lb (78.9 kg)  01/26/23 177 lb 6.4 oz (80.5 kg)  11/28/22 172 lb 13.5 oz (78.4 kg)        Assessment & Plan:  Acute sinusitis, recurrence not specified, unspecified location Assessment & Plan: New. Has done well previously with doxycycline. Augmentin causes diarrhea.  She is also coughing. Will rx with promethazine DM cough syrup. Pt is advised to call if symptoms worsen or if symptoms do not improve.    Other orders -     Doxycycline Hyclate; Take 1 tablet (100 mg total) by mouth 2 (two) times daily.  Dispense: 20 tablet; Refill: 0 -     Promethazine-DM; Take 5 mLs by mouth 4 (four) times daily as needed for cough.  Dispense: 180 mL; Refill: 0    I, Nance Pear, NP, personally preformed the services described in this documentation.  All medical record entries made by the scribe were at my direction and in my presence.  I have reviewed the chart and discharge instructions (if applicable) and agree that the record reflects my personal performance and is accurate and complete. 01/31/2023   Lacretia Leigh as a scribe for Nance Pear, NP.,have documented all relevant documentation on the behalf of Nance Pear, NP,as directed by  [facility]  Rowe Pavy, NP while in the presence of Nance Pear, NP.   Nance Pear, NP

## 2023-02-06 ENCOUNTER — Other Ambulatory Visit (HOSPITAL_COMMUNITY): Payer: Self-pay

## 2023-02-06 ENCOUNTER — Encounter: Payer: Self-pay | Admitting: Family

## 2023-02-06 MED ORDER — PREDNISONE 10 MG PO TABS
ORAL_TABLET | ORAL | 0 refills | Status: DC
  Filled 2023-02-06: qty 20, 8d supply, fill #0

## 2023-02-13 ENCOUNTER — Ambulatory Visit (HOSPITAL_COMMUNITY): Payer: No Typology Code available for payment source

## 2023-02-22 ENCOUNTER — Ambulatory Visit (HOSPITAL_COMMUNITY)
Admission: RE | Admit: 2023-02-22 | Discharge: 2023-02-22 | Disposition: A | Discharge status: Home / Self Care | Payer: No Typology Code available for payment source | Source: Ambulatory Visit | Attending: Cardiovascular Disease | Admitting: Cardiovascular Disease

## 2023-02-22 ENCOUNTER — Telehealth: Payer: Self-pay

## 2023-02-22 DIAGNOSIS — E785 Hyperlipidemia, unspecified: Secondary | ICD-10-CM | POA: Insufficient documentation

## 2023-02-22 NOTE — Telephone Encounter (Signed)
Received [facility] pt reference ready to start PREP.  Returned call to patient and left message requesting call back. Class would start 02/27/23 at 230p. Would need to do intake Monday 02/26/23 at 4pm. Requested call back to confirm.

## 2023-02-22 NOTE — Telephone Encounter (Signed)
Call back from pt. Confirmed start of 02/27/23 T/TH 230p-345p  Intake scheduled for 02/26/23 at 4pm at [facility]. Will meet her in lobby

## 2023-02-26 NOTE — Progress Notes (Signed)
[facility] PREP Evaluation  Patient Details  Name: [name] MRN: [id number] Date of Birth: 1961/01/19 Age: 62 y.o. PCP: Carollee Herter, Alferd Apa, DO  Vitals:   02/26/23 1636  BP: (!) 142/88  Pulse: 82  SpO2: 97%  Weight: 180 lb 3.2 oz (81.7 kg)     [facility] Eval - 02/26/23 1600       [facility] "PREP" Location   [facility] "PREP" Location [place]      Referral    Referring Provider Oval Linsey    Reason for referral Hypertension;High Cholesterol;Inactivity    Program Start Date 02/27/23   T/TH 230p-345p x 12 wks     Measurement   Waist Circumference 43 inches    Hip Circumference 45 inches    Body fat 42.6 percent      Information for Trainer   Goals Lose 30 lbs, lower BP    Current Exercise none    Orthopedic Concerns hx of plantar fasciitis, left distal radius tx, chronic low back ache    Pertinent Medical History HTN, Migraines, High cholesterol    Current Barriers none    Restrictions/Precautions --   none   Medications that affect exercise Medication causing dizziness/drowsiness      Timed Up and Go (TUGS)   Timed Up and Go Low risk <9 seconds      Mobility and Daily Activities   I find it easy to walk up or down two or more flights of stairs. 1    I have no trouble taking out the trash. 4    I do housework such as vacuuming and dusting on my own without difficulty. 4    I can easily lift a gallon of milk (8lbs). 4    I can easily walk a mile. 4    I have no trouble reaching into high cupboards or reaching down to pick up something from the floor. 4    I do not have trouble doing out-door work such as Armed forces logistics/support/administrative officer, raking leaves, or gardening. 4      Mobility and Daily Activities   I feel younger than my age. 4    I feel independent. 4    I feel energetic. 1    I live an active life.  1    I feel strong. 1    I feel healthy. 3    I feel active as other people my age. 4      How fit and strong are you.   Fit and Strong Total Score 43            Past  Medical History:  Diagnosis Date   Allergy    seasonal allergies   Anxiety    on meds   Arthritis    on meds   Depression    on meds   Dizziness 03/05/2017   Essential hypertension 03/05/2017   on meds   GERD (gastroesophageal reflux disease)    on meds   Hyperlipidemia 03/05/2017   on meds   Infertility, female    Migraines    Osteopenia    Past Surgical History:  Procedure Laterality Date   ANKLE SURGERY Left 1979   COLONOSCOPY  2011   MS-F/V-moviprep(exc)-normal -10 yr recall   SHOULDER SURGERY Left 1990   Social History   Tobacco Use  Smoking Status Former   Types: Cigarettes   Quit date: 02/09/1992   Years since quitting: 31.0  Smokeless Tobacco Never    Pam Tally Joe  02/26/2023, 4:42 PM

## 2023-03-01 NOTE — Progress Notes (Signed)
[facility] PREP Weekly Session  Patient Details  Name: [name] MRN: [id number] Date of Birth: July 03, 1961 Age: 62 y.o. PCP: Ann Held, DO  There were no vitals filed for this visit.   [facility] Weekly seesion - 03/01/23 1500       [facility] "PREP" Location   [facility] "PREP" Location [facility]      Weekly Session   Topic Discussed Goal setting and welcome to the program   Fit testing, stretch   Classes attended to date 2             Barnett Hatter 03/01/2023, 3:46 PM

## 2023-03-20 NOTE — Progress Notes (Signed)
[facility] PREP Weekly Session  Patient Details  Name: [name] MRN: [id number] Date of Birth: [birth year] Age: 62 y.o. PCP: Donato Schultz, DO  Vitals:   03/20/23 1430  Weight: 174 lb (78.9 kg)     [facility] Weekly seesion - 03/20/23 1700       [facility] "PREP" Location   [facility]P" [facility]      Weekly Session   Topic Discussed Healthy eating tips    Minutes exercised this week 220 minutes    Classes attended to date 5             Pam Jerral Bonito 03/20/2023, 5:04 PM

## 2023-03-23 ENCOUNTER — Other Ambulatory Visit: Payer: Self-pay

## 2023-03-26 ENCOUNTER — Other Ambulatory Visit (HOSPITAL_COMMUNITY): Payer: Self-pay

## 2023-03-26 ENCOUNTER — Encounter: Payer: Self-pay | Admitting: *Deleted

## 2023-03-28 ENCOUNTER — Other Ambulatory Visit: Payer: Self-pay

## 2023-04-02 ENCOUNTER — Other Ambulatory Visit: Payer: Self-pay

## 2023-04-03 ENCOUNTER — Other Ambulatory Visit (HOSPITAL_COMMUNITY): Payer: Self-pay

## 2023-04-03 NOTE — Progress Notes (Signed)
[facility] PREP Weekly Session  Patient Details  Name: [name] MRN: [id number] Date of Birth: 1961-07-03 Age: 62 y.o. PCP: Donato Schultz, DO  Vitals:   04/03/23 1430  Weight: 170 lb 1.6 oz (77.2 kg)     [facility] Weekly seesion - 04/03/23 1700       [facility] "PREP" Location   [facility]      Weekly Session   Topic Discussed Restaurant Eating   Salt and sugar demos   Minutes exercised this week 120 minutes    Classes attended to date 6             [name] 04/03/2023, 5:03 PM

## 2023-04-10 NOTE — Progress Notes (Signed)
[facility] PREP Weekly Session  Patient Details  Name: [name] MRN: [id number] Date of Birth: [birth year] Age: 62 y.o. PCP: Donato Schultz, DO  Vitals:   04/10/23 1614  Weight: 168 lb (76.2 kg)     [facility] Weekly seesion - 04/10/23 1600       [facility] "PREP" Location   [facility] "PREP" Location [facility]      Weekly Session   Topic Discussed Stress management and problem solving   relaxation mediation   Minutes exercised this week 280 minutes    Classes attended to date 62             Pam Jerral Bonito 04/10/2023, 4:15 PM

## 2023-04-13 ENCOUNTER — Encounter: Payer: No Typology Code available for payment source | Admitting: Physician Assistant

## 2023-04-16 ENCOUNTER — Other Ambulatory Visit: Payer: Self-pay | Admitting: Physician Assistant

## 2023-04-16 ENCOUNTER — Other Ambulatory Visit: Payer: Self-pay | Admitting: Family Medicine

## 2023-04-16 DIAGNOSIS — G43809 Other migraine, not intractable, without status migrainosus: Secondary | ICD-10-CM

## 2023-04-17 ENCOUNTER — Other Ambulatory Visit (HOSPITAL_COMMUNITY): Payer: Self-pay

## 2023-04-17 ENCOUNTER — Other Ambulatory Visit: Payer: Self-pay

## 2023-04-17 MED ORDER — CELECOXIB 100 MG PO CAPS
100.0000 mg | ORAL_CAPSULE | Freq: Two times a day (BID) | ORAL | 3 refills | Status: DC
  Filled 2023-04-17: qty 60, 30d supply, fill #0
  Filled 2023-06-08: qty 60, 30d supply, fill #1
  Filled 2023-07-27 – 2023-07-30 (×2): qty 60, 30d supply, fill #2
  Filled 2023-09-18: qty 60, 30d supply, fill #3

## 2023-04-18 ENCOUNTER — Other Ambulatory Visit (HOSPITAL_COMMUNITY): Payer: Self-pay

## 2023-04-18 MED ORDER — TYRVAYA 0.03 MG/ACT NA SOLN
1.0000 | Freq: Two times a day (BID) | NASAL | 3 refills | Status: DC
  Filled 2023-04-18 – 2023-06-08 (×4): qty 8.4, 30d supply, fill #0
  Filled 2023-07-27: qty 4.2, 30d supply, fill #0
  Filled 2023-07-30: qty 8.4, 30d supply, fill #0
  Filled 2023-08-24: qty 4.2, 28d supply, fill #0
  Filled 2023-08-27: qty 8.4, 30d supply, fill #0
  Filled 2023-08-27: qty 8.4, 42d supply, fill #0

## 2023-04-19 ENCOUNTER — Other Ambulatory Visit (HOSPITAL_COMMUNITY): Payer: Self-pay

## 2023-04-20 ENCOUNTER — Ambulatory Visit (INDEPENDENT_AMBULATORY_CARE_PROVIDER_SITE_OTHER): Payer: No Typology Code available for payment source | Admitting: Physician Assistant

## 2023-04-20 ENCOUNTER — Other Ambulatory Visit: Payer: Self-pay

## 2023-04-20 ENCOUNTER — Other Ambulatory Visit (HOSPITAL_COMMUNITY): Payer: Self-pay

## 2023-04-20 ENCOUNTER — Encounter: Payer: Self-pay | Admitting: Physician Assistant

## 2023-04-20 VITALS — BP 141/93 | HR 59 | Wt 172.0 lb

## 2023-04-20 DIAGNOSIS — M62838 Other muscle spasm: Secondary | ICD-10-CM

## 2023-04-20 DIAGNOSIS — G43009 Migraine without aura, not intractable, without status migrainosus: Secondary | ICD-10-CM

## 2023-04-20 MED ORDER — BACLOFEN 10 MG PO TABS
ORAL_TABLET | Freq: Three times a day (TID) | ORAL | 4 refills | Status: DC | PRN
  Filled 2023-04-20: qty 60, 20d supply, fill #0
  Filled 2023-07-27 – 2023-07-30 (×2): qty 60, 20d supply, fill #1
  Filled 2023-10-10: qty 60, 20d supply, fill #2
  Filled 2023-11-15: qty 60, 20d supply, fill #3
  Filled 2024-01-14: qty 60, 20d supply, fill #4

## 2023-04-20 MED ORDER — ONABOTULINUMTOXINA 100 UNITS IJ SOLR
100.0000 [IU] | Freq: Once | INTRAMUSCULAR | Status: AC
  Administered 2023-04-20: 100 [IU] via INTRAMUSCULAR

## 2023-04-20 MED ORDER — TOPIRAMATE 200 MG PO TABS
ORAL_TABLET | Freq: Every day | ORAL | 11 refills | Status: DC
  Filled 2023-04-20: qty 30, 30d supply, fill #0
  Filled 2023-06-08: qty 30, 30d supply, fill #1
  Filled 2023-07-27 – 2023-07-30 (×2): qty 30, 30d supply, fill #2
  Filled 2023-09-18: qty 30, 30d supply, fill #3
  Filled 2023-11-15: qty 30, 30d supply, fill #4
  Filled 2024-01-14: qty 30, 30d supply, fill #5
  Filled 2024-02-26: qty 30, 30d supply, fill #6

## 2023-04-20 NOTE — Progress Notes (Signed)
CC: botox   S: Pt in office today for Botox injections. Her Botox has been working very well for migraine prevention. She is using nurtec also as needed.    O: BP (!) 141/93   Pulse (!) 59   Wt 172 lb (78 kg)   BMI 29.52 kg/m    Botox Procedure Note Vial of Botox was : Here in office fridge for patient  Botox Dosing by [employer] for Chronic Migraine   Injection Sites for Migraines  Botox 100 units was injected using the dosage in the table above in the pattern shown above.No waste  A: Migraine  Muscle spasm   P: Botox 100 units injected today.  RTC 3 Months.

## 2023-04-23 ENCOUNTER — Other Ambulatory Visit (HOSPITAL_COMMUNITY): Payer: Self-pay

## 2023-04-23 ENCOUNTER — Other Ambulatory Visit: Payer: Self-pay

## 2023-04-25 ENCOUNTER — Other Ambulatory Visit (HOSPITAL_COMMUNITY): Payer: Self-pay

## 2023-04-25 ENCOUNTER — Other Ambulatory Visit: Payer: Self-pay

## 2023-04-27 ENCOUNTER — Ambulatory Visit (HOSPITAL_BASED_OUTPATIENT_CLINIC_OR_DEPARTMENT_OTHER): Payer: No Typology Code available for payment source | Admitting: Family

## 2023-04-27 ENCOUNTER — Ambulatory Visit (HOSPITAL_BASED_OUTPATIENT_CLINIC_OR_DEPARTMENT_OTHER): Payer: No Typology Code available for payment source | Admitting: Cardiovascular Disease

## 2023-04-27 ENCOUNTER — Other Ambulatory Visit (HOSPITAL_COMMUNITY): Payer: Self-pay

## 2023-04-28 ENCOUNTER — Other Ambulatory Visit (HOSPITAL_COMMUNITY): Payer: Self-pay

## 2023-05-03 NOTE — Progress Notes (Signed)
[facility] PREP Weekly Session  Patient Details  Name: [name] MRN: [id number] Date of Birth: Feb 25, 1961 Age: 62 y.o. PCP: Donato Schultz, DO  Vitals:   05/01/23 1430  Weight: 169 lb (76.7 kg)     [facility] Weekly seesion - 05/03/23 1500       [facility] "PREP" Location   [facility] "PREP" Location [facility]      Weekly Session   Topic Discussed Finding support    Minutes exercised this week 510 minutes    Classes attended to date 14             [name] 05/03/2023, 3:57 PM

## 2023-05-08 ENCOUNTER — Other Ambulatory Visit (HOSPITAL_COMMUNITY): Payer: Self-pay

## 2023-05-25 NOTE — Progress Notes (Signed)
[facility] PREP Evaluation  Patient Details  Name: [name] MRN: [id number] Date of Birth: 1961/11/04 Age: 62 y.o. PCP: Zola Button, Grayling Congress, DO  Vitals:   05/24/23 1500  BP: 132/86  Pulse: 63  Weight: 169 lb 12.8 oz (77 kg)     [facility] Eval - 05/25/23 1500       [facility] "PREP" Location   [facility] "PREP" Location [facility]      Referral    Referring Provider Duke Salvia    Program Start Date 02/27/23    Program End Date 05/22/23      Measurement   Waist Circumference 43 inches    Waist Circumference End Program 43 inches    Hip Circumference 45 inches    Hip Circumference End Program 44 inches    Body fat 40.9 percent      Information for Trainer   Goals Reset      Mobility and Daily Activities   I find it easy to walk up or down two or more flights of stairs. 3    I have no trouble taking out the trash. 4    I do housework such as vacuuming and dusting on my own without difficulty. 4    I can easily lift a gallon of milk (8lbs). 4    I can easily walk a mile. 4    I have no trouble reaching into high cupboards or reaching down to pick up something from the floor. 3    I do not have trouble doing out-door work such as Loss adjuster, chartered, raking leaves, or gardening. 3      Mobility and Daily Activities   I feel younger than my age. 4    I feel independent. 4    I feel energetic. 2    I live an active life.  2    I feel strong. 2    I feel healthy. 3    I feel active as other people my age. 3      How fit and strong are you.   Fit and Strong Total Score 45            Past Medical History:  Diagnosis Date   Allergy    seasonal allergies   Anxiety    on meds   Arthritis    on meds   Depression    on meds   Dizziness 03/05/2017   Essential hypertension 03/05/2017   on meds   GERD (gastroesophageal reflux disease)    on meds   Hyperlipidemia 03/05/2017   on meds   Infertility, female    Migraines    Osteopenia    Past Surgical History:  Procedure  Laterality Date   ANKLE SURGERY Left 1979   COLONOSCOPY  2011   MS-F/V-moviprep(exc)-normal -10 yr recall   SHOULDER SURGERY Left 1990   Social History   Tobacco Use  Smoking Status Former   Types: Cigarettes   Quit date: 02/09/1992   Years since quitting: 31.3  Smokeless Tobacco Never  Attended 14 sessions, 6 educational  Fit testing Cardio march: 261 to 271 Sit to stand: 14 to 16 Bicep curls: 22 to 21 Balance remains good. Has plans to continue to exercise.    [name] 05/25/2023, 3:03 PM

## 2023-06-08 ENCOUNTER — Other Ambulatory Visit: Payer: Self-pay | Admitting: Family Medicine

## 2023-06-08 ENCOUNTER — Other Ambulatory Visit: Payer: Self-pay

## 2023-06-08 ENCOUNTER — Other Ambulatory Visit (HOSPITAL_COMMUNITY): Payer: Self-pay

## 2023-06-08 DIAGNOSIS — F419 Anxiety disorder, unspecified: Secondary | ICD-10-CM

## 2023-06-08 MED ORDER — SERTRALINE HCL 100 MG PO TABS
150.0000 mg | ORAL_TABLET | Freq: Every day | ORAL | 0 refills | Status: DC
  Filled 2023-06-08: qty 135, 90d supply, fill #0

## 2023-06-08 MED ORDER — FLUTICASONE PROPIONATE 50 MCG/ACT NA SUSP
2.0000 | Freq: Every day | NASAL | 5 refills | Status: DC
  Filled 2023-06-08: qty 16, 30d supply, fill #0
  Filled 2023-07-27 – 2023-07-30 (×2): qty 16, 30d supply, fill #1
  Filled 2023-09-18: qty 16, 30d supply, fill #2
  Filled 2023-11-15: qty 16, 30d supply, fill #3

## 2023-06-11 ENCOUNTER — Other Ambulatory Visit (HOSPITAL_COMMUNITY): Payer: Self-pay

## 2023-06-22 ENCOUNTER — Other Ambulatory Visit (HOSPITAL_COMMUNITY): Payer: Self-pay

## 2023-06-22 ENCOUNTER — Telehealth: Payer: Self-pay

## 2023-06-22 ENCOUNTER — Other Ambulatory Visit: Payer: Self-pay

## 2023-06-22 ENCOUNTER — Other Ambulatory Visit: Payer: Self-pay | Admitting: Family Medicine

## 2023-06-22 DIAGNOSIS — G4700 Insomnia, unspecified: Secondary | ICD-10-CM

## 2023-06-22 MED ORDER — TRAZODONE HCL 50 MG PO TABS
ORAL_TABLET | Freq: Every evening | ORAL | 0 refills | Status: DC | PRN
  Filled 2023-06-22: qty 30, 30d supply, fill #0

## 2023-06-22 NOTE — Telephone Encounter (Signed)
Left message for pt to call office back regarding appointment change from 8/9.

## 2023-06-27 ENCOUNTER — Other Ambulatory Visit (HOSPITAL_COMMUNITY): Payer: Self-pay

## 2023-06-28 ENCOUNTER — Other Ambulatory Visit (HOSPITAL_COMMUNITY): Payer: Self-pay

## 2023-07-03 ENCOUNTER — Other Ambulatory Visit (HOSPITAL_COMMUNITY): Payer: Self-pay

## 2023-07-13 ENCOUNTER — Ambulatory Visit (INDEPENDENT_AMBULATORY_CARE_PROVIDER_SITE_OTHER): Payer: No Typology Code available for payment source | Admitting: Physician Assistant

## 2023-07-13 ENCOUNTER — Encounter: Payer: Self-pay | Admitting: Physician Assistant

## 2023-07-13 ENCOUNTER — Other Ambulatory Visit (HOSPITAL_COMMUNITY): Payer: Self-pay

## 2023-07-13 DIAGNOSIS — G43709 Chronic migraine without aura, not intractable, without status migrainosus: Secondary | ICD-10-CM | POA: Diagnosis not present

## 2023-07-13 DIAGNOSIS — M542 Cervicalgia: Secondary | ICD-10-CM

## 2023-07-13 DIAGNOSIS — M62838 Other muscle spasm: Secondary | ICD-10-CM

## 2023-07-13 MED ORDER — TIZANIDINE HCL 4 MG PO TABS
4.0000 mg | ORAL_TABLET | Freq: Three times a day (TID) | ORAL | 2 refills | Status: DC
  Filled 2023-07-13: qty 90, 30d supply, fill #0
  Filled 2023-08-24 – 2023-08-27 (×2): qty 90, 30d supply, fill #1
  Filled 2024-01-14: qty 90, 30d supply, fill #2

## 2023-07-13 MED ORDER — KETOCONAZOLE 2 % EX CREA
TOPICAL_CREAM | CUTANEOUS | 0 refills | Status: AC
  Filled 2023-07-13: qty 15, 14d supply, fill #0

## 2023-07-13 MED ORDER — BUTALBITAL-APAP-CAFFEINE 50-300-40 MG PO CAPS
1.0000 | ORAL_CAPSULE | Freq: Three times a day (TID) | ORAL | 1 refills | Status: DC | PRN
  Filled 2023-07-13: qty 30, 5d supply, fill #0
  Filled 2023-08-24 – 2023-08-27 (×2): qty 30, 5d supply, fill #1

## 2023-07-13 MED ORDER — METRONIDAZOLE 0.75 % EX GEL
CUTANEOUS | 2 refills | Status: DC
  Filled 2023-07-13: qty 45, 30d supply, fill #0
  Filled 2023-08-24 – 2023-08-27 (×2): qty 45, 30d supply, fill #1
  Filled 2024-02-26: qty 45, 30d supply, fill #2

## 2023-07-13 MED ORDER — ONABOTULINUMTOXINA 100 UNITS IJ SOLR
100.0000 [IU] | Freq: Once | INTRAMUSCULAR | Status: AC
  Administered 2023-07-13: 100 [IU] via INTRAMUSCULAR

## 2023-07-13 NOTE — Progress Notes (Signed)
S: Pt in office today for Botox injections. Her Botox has been working very well for migraine prevention. She is using 100units every 3 months with good effect.    Botox Procedure Note Vial of Botox was : Available in office with patient's name on rx.   Botox Dosing by [facility] for Chronic Migraine   Injection Sites for Migraines  Botox 100 units was injected using the dosage in the table above in the pattern shown above.  A: Migraine  Muscle spasm   P: Botox 100 units injected today.  Meds refilled as needed.   RTC 3 Months.

## 2023-07-13 NOTE — Addendum Note (Signed)
Addended by: Kennon Portela on: 07/13/2023 12:10 PM   Modules accepted: Orders

## 2023-07-13 NOTE — Progress Notes (Signed)
Pt presents for Botox Injection

## 2023-07-17 ENCOUNTER — Other Ambulatory Visit (HOSPITAL_COMMUNITY): Payer: Self-pay

## 2023-07-17 MED ORDER — FLUOROURACIL 5 % EX CREA
TOPICAL_CREAM | CUTANEOUS | 0 refills | Status: AC
  Filled 2023-07-17: qty 40, 42d supply, fill #0

## 2023-07-20 ENCOUNTER — Encounter: Payer: No Typology Code available for payment source | Admitting: Physician Assistant

## 2023-07-27 ENCOUNTER — Other Ambulatory Visit: Payer: Self-pay

## 2023-07-27 ENCOUNTER — Other Ambulatory Visit (HOSPITAL_COMMUNITY): Payer: Self-pay

## 2023-07-27 ENCOUNTER — Other Ambulatory Visit: Payer: Self-pay | Admitting: Family Medicine

## 2023-07-27 DIAGNOSIS — F419 Anxiety disorder, unspecified: Secondary | ICD-10-CM

## 2023-07-27 MED ORDER — PREDNISONE 10 MG PO TABS
ORAL_TABLET | ORAL | 0 refills | Status: DC
  Filled 2023-07-27: qty 10, 4d supply, fill #0

## 2023-07-27 MED ORDER — SERTRALINE HCL 100 MG PO TABS
150.0000 mg | ORAL_TABLET | Freq: Every day | ORAL | 0 refills | Status: DC
  Filled 2023-07-27 – 2023-08-24 (×2): qty 135, 90d supply, fill #0

## 2023-07-30 ENCOUNTER — Other Ambulatory Visit (HOSPITAL_COMMUNITY): Payer: Self-pay

## 2023-07-30 ENCOUNTER — Other Ambulatory Visit: Payer: Self-pay

## 2023-08-02 ENCOUNTER — Other Ambulatory Visit (HOSPITAL_COMMUNITY): Payer: Self-pay

## 2023-08-03 ENCOUNTER — Other Ambulatory Visit (HOSPITAL_COMMUNITY): Payer: Self-pay

## 2023-08-14 ENCOUNTER — Encounter (HOSPITAL_BASED_OUTPATIENT_CLINIC_OR_DEPARTMENT_OTHER): Payer: Self-pay | Admitting: Cardiovascular Disease

## 2023-08-14 ENCOUNTER — Other Ambulatory Visit (HOSPITAL_COMMUNITY): Payer: Self-pay

## 2023-08-14 ENCOUNTER — Ambulatory Visit (HOSPITAL_BASED_OUTPATIENT_CLINIC_OR_DEPARTMENT_OTHER): Payer: No Typology Code available for payment source | Admitting: Cardiovascular Disease

## 2023-08-14 VITALS — BP 149/84 | HR 84 | Ht 64.0 in | Wt 172.0 lb

## 2023-08-14 DIAGNOSIS — I10 Essential (primary) hypertension: Secondary | ICD-10-CM

## 2023-08-14 DIAGNOSIS — G43009 Migraine without aura, not intractable, without status migrainosus: Secondary | ICD-10-CM | POA: Diagnosis not present

## 2023-08-14 DIAGNOSIS — E7800 Pure hypercholesterolemia, unspecified: Secondary | ICD-10-CM

## 2023-08-14 DIAGNOSIS — Z6829 Body mass index (BMI) 29.0-29.9, adult: Secondary | ICD-10-CM

## 2023-08-14 MED ORDER — AMLODIPINE BESYLATE 5 MG PO TABS
5.0000 mg | ORAL_TABLET | Freq: Two times a day (BID) | ORAL | 3 refills | Status: DC
  Filled 2023-08-14 – 2023-08-24 (×2): qty 180, 90d supply, fill #0
  Filled 2023-09-18: qty 180, 90d supply, fill #1
  Filled 2024-02-26: qty 180, 90d supply, fill #2
  Filled 2024-06-10 – 2024-07-04 (×2): qty 180, 90d supply, fill #3

## 2023-08-14 NOTE — Progress Notes (Signed)
Cardiology Office Note:  .    Date:  08/30/2023  ID:  [name], DOB [birth year], MRN [id number] PCP: Zola Button, Grayling Congress, DO  [facility] Providers Cardiologist:  None     History of Present Illness: .    [name] is a 62 y.o. female with hypertension and hyperlipidemia who presents for follow up.  She was first seen 01/2017 for an evaluation of chest pain and an abnormal EKG.  She was referred for an Lexiscan Myoview that revealed LVEF 53% and no ischemia. She also reported dizziness and her heart rate was noted to be 45 bpm.  Metoprolol was switched to amlodipine.  She reported high blood pressures and it was recommended that she reduce alcohol and Sudafed use.  Based on her lipid profile she was started on rosuvastatin 40 mg daily.  Her evening blood pressures were elevated so amlodipine was split into 2.5 mg twice daily.  Since making that change of blood pressure has been very well-controlled and she only had one elevated reading.    2019 was a stressful year for Ms. Reitmeier.  Her sister died of an overdose.  Her husband left her one year ago.  She struggled with heavy alcohol use and anxiety.  She was treated for two months and is doing much better now.  She had been doing well physically. Her blood pressure was elevated but she was under a lot of stress at the time but she was otherwise doing well.  Ms. Eichorn successfully cut back on her alcohol consumption. On 08/13/2022 she was seen in urgent care for a closed fracture of distal end of left radius. She had slipped on wet grass and fallen on her left wrist. She was again seen in urgent care 11/28/2022 with severe dizziness in the setting of migraine. Manual blood pressure was 80/49 with a pulse in the 40s. She was given 2 L of IV fluids. It was noted that she took multiple prescribed migraine medications which caused a reaction resulting in hypotension, bradycardia, and dehydration with poor appetite.    At her visit 01/2023  she noted worsening exertional dyspnea.  Calcium score 02/2023 was 0.  She was bradycardic but asymptomatic.  She was referred to the PREP program at the [facility].  Today, she states she is recently struggling with more frequent migraines. Her blood pressures have been elevated at home which she has correlated with the migraines. In the mornings her blood pressure is typically more stable, but she sees higher readings in the evenings. Usually she takes her amlodipine at night time. In the office today her blood pressure is elevated to 149/84. No dizzy spells lately. She completed the PREP program which she enjoyed. At home she continues to use barbells, and also goes walking for exercise. She denies any palpitations, chest pain, shortness of breath, peripheral edema, syncope, orthopnea, or PND.  ROS:  Please see the history of present illness. All other systems are reviewed and negative.  (+) Migraine headaches  Studies Reviewed: .        CT Cardiac Scoring  02/22/2023: IMPRESSION: Coronary calcium score of 0.  Risk Assessment/Calculations:        Physical Exam:    VS:  BP (!) 149/84   Pulse 84   Ht 5\' 4"  (1.626 m)   Wt 172 lb (78 kg)   BMI 29.52 kg/m  , BMI Body mass index is 29.52 kg/m. GENERAL:  Well appearing HEENT: Pupils equal round and  reactive, fundi not visualized, oral mucosa unremarkable NECK:  No jugular venous distention, waveform within normal limits, carotid upstroke brisk and symmetric, no bruits, no thyromegaly LUNGS:  Clear to auscultation bilaterally HEART:  RRR.  PMI not displaced or sustained,S1 and S2 within normal limits, no S3, no S4, no clicks, no rubs, no murmurs ABD:  Flat, positive bowel sounds normal in frequency in pitch, no bruits, no rebound, no guarding, no midline pulsatile mass, no hepatomegaly, no splenomegaly EXT:  2 plus pulses throughout, no edema, no cyanosis no clubbing SKIN:  No rashes no nodules NEURO:  Cranial nerves II through XII grossly  intact, motor grossly intact throughout PSYCH:  Cognitively intact, oriented to person place and time  Wt Readings from Last 3 Encounters:  08/14/23 172 lb (78 kg)  05/24/23 169 lb 12.8 oz (77 kg)  05/01/23 169 lb (76.7 kg)     ASSESSMENT AND PLAN: .    No problem-specific Assessment & Plan notes found for this encounter. # Hypertension:  Blood pressure has been uncontrolled.  Migraine may be contributing.  We will have her increase amlodipine to 5mg  bid. Continue to recommend at least 150 minutes weekly.   # Hyperlipidemia: Lipids are well-controlled on rosuvastatin.   Check hemoglobin A1c.  Work on reducing carbohydrate intake.    Dispo:  FU with PharmD in 1-2 months. FU with Adonias Demore C. Duke Salvia, MD, [facility] in 1 year.  I,Mathew Stumpf,acting as a Neurosurgeon for Chilton Si, MD.,have documented all relevant documentation on the behalf of Chilton Si, MD,as directed by  Chilton Si, MD while in the presence of Chilton Si, MD.  I, Kutler Vanvranken C. Duke Salvia, MD have reviewed all documentation for this visit.  The documentation of the exam, diagnosis, procedures, and orders on 08/30/2023 are all accurate and complete.   Signed, Chilton Si, MD

## 2023-08-14 NOTE — Patient Instructions (Addendum)
Medication Instructions:  INCREASE YOUR AMLODIPINE TO 5 MG TWICE A DAY   *If you need a refill on your cardiac medications before your next appointment, please call your pharmacy*  Lab Work: A1C TODAY   Testing/Procedures: NONE  Follow-Up: At [facility], you and your health needs are our priority.  As part of our continuing mission to provide you with exceptional heart care, we have created designated Provider Care Teams.  These Care Teams include your primary Cardiologist (physician) and Advanced Practice Providers (APPs -  Physician Assistants and Nurse Practitioners) who all work together to provide you with the care you need, when you need it.  We recommend signing up for the patient portal called "MyChart".  Sign up information is provided on this After Visit Summary.  MyChart is used to connect with patients for Virtual Visits (Telemedicine).  Patients are able to view lab/test results, encounter notes, upcoming appointments, etc.  Non-urgent messages can be sent to your provider as well.   To learn more about what you can do with MyChart, go to [email or web address].    Your next appointment:   1-2 MONTHS month(s)  Provider:   PHARM D   1 YEAR WITH DR [facility]

## 2023-08-14 NOTE — Progress Notes (Deleted)
  Cardiology Office Note:  .   Date:  08/14/2023  ID:  [name], DOB 15-Jun-1961, MRN [id number] PCP: [name], Grayling Congress, DO  [facility] Providers Cardiologist:  None { Click to update primary MD,subspecialty MD or APP then REFRESH:1}   History of Present Illness: .   [name] is a 62 y.o. female with hypertension and hyperlipidemia who presents for follow up.  She was first seen 01/2017 for an evaluation of chest pain and an abnormal EKG.  She was referred for an Lexiscan Myoview that revealed LVEF 53% and no ischemia. She also reported dizziness and her heart rate was noted to be 45 bpm.  Metoprolol was switched to amlodipine.  She reported high blood pressures and it was recommended that she reduce alcohol and Sudafed use.  Based on her lipid profile she was started on rosuvastatin 40 mg daily.  Her evening blood pressures were elevated so amlodipine was split into 2.5 mg twice daily.  Since making that change of blood pressure has been very well-controlled and she only had one elevated reading.    2019 was a stressful year for [name].  Her sister died of an overdose.  Her husband left her one year ago.  She struggled with heavy alcohol use and anxiety.  She was treated for two months and is doing much better now.  She had been doing well physically. Her blood pressure was elevated but she was under a lot of stress at the time but she was otherwise doing well.  [name] successfully cut back on her alcohol consumption. On 08/13/2022 she was seen in urgent care for a closed fracture of distal end of left radius. She had slipped on wet grass and fallen on her left wrist. She was again seen in urgent care 11/28/2022 with severe dizziness in the setting of migraine. Manual blood pressure was 80/49 with a pulse in the 40s. She was given 2 L of IV fluids. It was noted that she took multiple prescribed migraine medications which caused a reaction resulting in hypotension, bradycardia,  and dehydration with poor appetite.    At her visit 01/2023 she notes worsening exertional dyspnea.  Calcium score 02/2023 was 0.  She was bradycardic but asymptomatic.  She was referred to the PREP program at the [facility].      ROS: ***  Studies Reviewed: .        *** Risk Assessment/Calculations:   {Does this patient have ATRIAL FIBRILLATION?[phone]} No BP recorded.  {Refresh Note OR Click here to enter BP  :1}***       Physical Exam:   VS:  There were no vitals taken for this visit.   Wt Readings from Last 3 Encounters:  05/24/23 169 lb 12.8 oz (77 kg)  05/01/23 169 lb (76.7 kg)  04/20/23 172 lb (78 kg)    GEN: Well nourished, well developed in no acute distress NECK: No JVD; No carotid bruits CARDIAC: ***RRR, no murmurs, rubs, gallops RESPIRATORY:  Clear to auscultation without rales, wheezing or rhonchi  ABDOMEN: Soft, non-tender, non-distended EXTREMITIES:  No edema; No deformity   ASSESSMENT AND PLAN: .   ***    {Are you ordering a CV Procedure (e.g. stress test, cath, DCCV, TEE, etc)?   Press F2        :[id number]}  Dispo: ***  Signed, Chilton Si, MD

## 2023-08-15 LAB — HEMOGLOBIN A1C
Est. average glucose Bld gHb Est-mCnc: 120 mg/dL
Hgb A1c MFr Bld: 5.8 % — ABNORMAL HIGH (ref 4.8–5.6)

## 2023-08-24 ENCOUNTER — Other Ambulatory Visit (HOSPITAL_COMMUNITY): Payer: Self-pay

## 2023-08-25 ENCOUNTER — Other Ambulatory Visit (HOSPITAL_COMMUNITY): Payer: Self-pay

## 2023-08-27 ENCOUNTER — Other Ambulatory Visit (HOSPITAL_COMMUNITY): Payer: Self-pay

## 2023-08-27 ENCOUNTER — Other Ambulatory Visit: Payer: Self-pay

## 2023-08-30 ENCOUNTER — Encounter (HOSPITAL_BASED_OUTPATIENT_CLINIC_OR_DEPARTMENT_OTHER): Payer: Self-pay | Admitting: Cardiovascular Disease

## 2023-09-01 ENCOUNTER — Encounter (HOSPITAL_COMMUNITY): Payer: Self-pay

## 2023-09-07 ENCOUNTER — Other Ambulatory Visit (HOSPITAL_COMMUNITY): Payer: Self-pay

## 2023-09-12 ENCOUNTER — Other Ambulatory Visit: Payer: Self-pay | Admitting: *Deleted

## 2023-09-18 ENCOUNTER — Ambulatory Visit: Payer: No Typology Code available for payment source

## 2023-09-18 ENCOUNTER — Other Ambulatory Visit: Payer: Self-pay | Admitting: Family Medicine

## 2023-09-18 ENCOUNTER — Other Ambulatory Visit (HOSPITAL_COMMUNITY): Payer: Self-pay

## 2023-09-18 ENCOUNTER — Other Ambulatory Visit: Payer: Self-pay

## 2023-09-18 DIAGNOSIS — F419 Anxiety disorder, unspecified: Secondary | ICD-10-CM

## 2023-09-18 MED ORDER — SERTRALINE HCL 100 MG PO TABS
150.0000 mg | ORAL_TABLET | Freq: Every day | ORAL | 0 refills | Status: DC
  Filled 2023-09-18: qty 135, 90d supply, fill #0

## 2023-09-19 ENCOUNTER — Other Ambulatory Visit (HOSPITAL_COMMUNITY): Payer: Self-pay

## 2023-09-19 MED ORDER — TRIAMCINOLONE ACETONIDE 0.1 % EX CREA
TOPICAL_CREAM | CUTANEOUS | 1 refills | Status: AC
  Filled 2023-09-19: qty 30, 14d supply, fill #0
  Filled 2024-07-04: qty 30, 14d supply, fill #1

## 2023-09-26 ENCOUNTER — Other Ambulatory Visit: Payer: Self-pay

## 2023-09-27 ENCOUNTER — Other Ambulatory Visit: Payer: Self-pay

## 2023-09-27 NOTE — Progress Notes (Signed)
Specialty Pharmacy Refill Coordination Note  [name] is a 62 y.o. female contacted today regarding refills of specialty medication(s) Onabotulinumtoxina   Patient requested Courier to Provider Office   Delivery date: 10/08/23   Verified address: [facility], [address], [phone]   Medication will be filled on 10/05/23.

## 2023-10-01 ENCOUNTER — Ambulatory Visit (HOSPITAL_BASED_OUTPATIENT_CLINIC_OR_DEPARTMENT_OTHER): Payer: No Typology Code available for payment source | Admitting: Cardiovascular Disease

## 2023-10-01 NOTE — Progress Notes (Deleted)
Cardiology Office Note:  .   Date:  10/01/2023  ID:  [name], DOB 11-30-1961, MRN [id number] PCP: [name], Alexandria Congress, DO  [facility] Providers Cardiologist:  Alexandria Si, MD { Click to update primary MD,subspecialty MD or APP then REFRESH:1}   History of Present Illness: .   [name] is a 62 y.o. female with hypertension and hyperlipidemia who presents for follow up.  She was first seen 01/2017 for an evaluation of chest pain and an abnormal EKG.  She was referred for an Lexiscan Myoview that revealed LVEF 53% and no ischemia. She also reported dizziness and her heart rate was noted to be 45 bpm.  Metoprolol was switched to amlodipine.  She reported high blood pressures and it was recommended that she reduce alcohol and Sudafed use.  Based on her lipid profile she was started on rosuvastatin 40 mg daily.  Her evening blood pressures were elevated so amlodipine was split into 2.5 mg twice daily.  Since making that change of blood pressure has been very well-controlled and she only had one elevated reading.    2019 was a stressful year for [name].  Her sister died of an overdose.  Her husband left her one year prior.  She struggled with heavy alcohol use and anxiety.  She was treated for two months with significant improvement. Her blood pressure was elevated but she was under a lot of stress at the time but she was otherwise doing well.  [name] successfully cut back on her alcohol consumption. On 08/13/2022 she was seen in urgent care for a closed fracture of distal end of left radius. She had slipped on wet grass and fallen on her left wrist. She was again seen in urgent care 11/28/2022 with severe dizziness in the setting of migraine. Manual blood pressure was 80/49 with a pulse in the 40s. She was given 2 L of IV fluids. It was noted that she took multiple prescribed migraine medications which caused a reaction resulting in hypotension, bradycardia, and dehydration  with poor appetite.    At her visit 01/2023 she noted worsening exertional dyspnea.  Calcium score 02/2023 was 0.  She was bradycardic but asymptomatic.  She was referred to the PREP program at the [facility].  At her appointment 08/2023 blood pressures were uncontrolled and she was struggling with headaches.  Amlodipine was increased to twice daily.  ROS: ***  Studies Reviewed: .        *** Risk Assessment/Calculations:   {Does this patient have ATRIAL FIBRILLATION?:[phone]} No BP recorded.  {Refresh Note OR Click here to enter BP  :1}***       Physical Exam:   VS:  There were no vitals taken for this visit.   Wt Readings from Last 3 Encounters:  08/14/23 172 lb (78 kg)  05/24/23 169 lb 12.8 oz (77 kg)  05/01/23 169 lb (76.7 kg)    GEN: Well nourished, well developed in no acute distress NECK: No JVD; No carotid bruits CARDIAC: ***RRR, no murmurs, rubs, gallops RESPIRATORY:  Clear to auscultation without rales, wheezing or rhonchi  ABDOMEN: Soft, non-tender, non-distended EXTREMITIES:  No edema; No deformity   ASSESSMENT AND PLAN: .   ***    {Are you ordering a CV Procedure (e.g. stress test, cath, DCCV, TEE, etc)?   Press F2        :[id number]}  Dispo: ***  Signed, Alexandria Si, MD

## 2023-10-04 ENCOUNTER — Ambulatory Visit: Payer: No Typology Code available for payment source

## 2023-10-05 ENCOUNTER — Other Ambulatory Visit: Payer: Self-pay

## 2023-10-08 ENCOUNTER — Other Ambulatory Visit: Payer: Self-pay

## 2023-10-10 ENCOUNTER — Other Ambulatory Visit: Payer: Self-pay

## 2023-10-10 ENCOUNTER — Other Ambulatory Visit (HOSPITAL_COMMUNITY): Payer: Self-pay

## 2023-10-11 ENCOUNTER — Other Ambulatory Visit (HOSPITAL_COMMUNITY): Payer: Self-pay

## 2023-10-12 ENCOUNTER — Ambulatory Visit (INDEPENDENT_AMBULATORY_CARE_PROVIDER_SITE_OTHER): Payer: No Typology Code available for payment source | Admitting: Physician Assistant

## 2023-10-12 ENCOUNTER — Encounter: Payer: Self-pay | Admitting: Physician Assistant

## 2023-10-12 VITALS — BP 109/72 | HR 76 | Wt 173.6 lb

## 2023-10-12 DIAGNOSIS — G43709 Chronic migraine without aura, not intractable, without status migrainosus: Secondary | ICD-10-CM

## 2023-10-12 DIAGNOSIS — M62838 Other muscle spasm: Secondary | ICD-10-CM

## 2023-10-12 MED ORDER — ONABOTULINUMTOXINA 100 UNITS IJ SOLR
100.0000 [IU] | Freq: Once | INTRAMUSCULAR | Status: AC
  Administered 2023-10-12: 100 [IU] via INTRAMUSCULAR

## 2023-10-12 NOTE — Progress Notes (Signed)
CC: Botox  S: Pt in office today for Botox injections. Her Botox has been working very well for migraine prevention.    O: BP 109/72   Pulse 76   Wt 173 lb 9.6 oz (78.7 kg)   BMI 29.80 kg/m    Botox Procedure Note Vial of Botox was sent to office and refrigerated until patient arrival  Botox Dosing by [facility] [facility] for Chronic Migraine   Injection Sites for Migraines  Botox 100 units was injected using the dosage in the table above in the pattern shown above.  A: Migraine  Muscle spasm   P: Botox 100 units injected today.  RTC 3 Months.

## 2023-10-17 ENCOUNTER — Other Ambulatory Visit: Payer: Self-pay

## 2023-10-23 ENCOUNTER — Other Ambulatory Visit (HOSPITAL_COMMUNITY): Payer: Self-pay

## 2023-10-23 MED ORDER — KETOCONAZOLE 2 % EX CREA
TOPICAL_CREAM | CUTANEOUS | 1 refills | Status: AC
  Filled 2023-10-23 – 2024-01-14 (×2): qty 15, 14d supply, fill #0
  Filled 2024-09-15: qty 15, 14d supply, fill #1

## 2023-10-23 MED ORDER — TRIAMCINOLONE ACETONIDE 0.1 % EX CREA
TOPICAL_CREAM | CUTANEOUS | 1 refills | Status: AC
  Filled 2023-10-23 – 2024-04-23 (×2): qty 30, 14d supply, fill #0
  Filled 2024-08-18: qty 30, 14d supply, fill #1

## 2023-10-26 ENCOUNTER — Other Ambulatory Visit (HOSPITAL_COMMUNITY): Payer: Self-pay

## 2023-11-05 ENCOUNTER — Other Ambulatory Visit (HOSPITAL_COMMUNITY): Payer: Self-pay

## 2023-11-09 ENCOUNTER — Other Ambulatory Visit (HOSPITAL_COMMUNITY): Payer: Self-pay

## 2023-11-14 ENCOUNTER — Other Ambulatory Visit (HOSPITAL_COMMUNITY): Payer: Self-pay

## 2023-11-14 MED ORDER — METRONIDAZOLE 0.75 % EX GEL
1.0000 | Freq: Two times a day (BID) | CUTANEOUS | 2 refills | Status: DC
  Filled 2023-11-14: qty 45, 23d supply, fill #0
  Filled 2024-07-04: qty 45, 23d supply, fill #1
  Filled 2024-08-18: qty 45, 23d supply, fill #2

## 2023-11-14 MED ORDER — HYDROCORTISONE 2.5 % EX OINT
1.0000 | TOPICAL_OINTMENT | Freq: Two times a day (BID) | CUTANEOUS | 1 refills | Status: AC
  Filled 2023-11-14: qty 20, 10d supply, fill #0
  Filled 2024-10-28 – 2024-11-07 (×3): qty 20, 10d supply, fill #1

## 2023-11-14 MED ORDER — NYSTATIN 100000 UNIT/ML MT SUSP
4.0000 mL | Freq: Three times a day (TID) | OROMUCOSAL | 0 refills | Status: AC
  Filled 2023-11-14: qty 84, 7d supply, fill #0

## 2023-11-15 ENCOUNTER — Other Ambulatory Visit (HOSPITAL_COMMUNITY): Payer: Self-pay

## 2023-11-15 ENCOUNTER — Other Ambulatory Visit: Payer: Self-pay

## 2023-11-15 ENCOUNTER — Other Ambulatory Visit: Payer: Self-pay | Admitting: Family Medicine

## 2023-11-15 ENCOUNTER — Other Ambulatory Visit: Payer: Self-pay | Admitting: Physician Assistant

## 2023-11-15 DIAGNOSIS — F419 Anxiety disorder, unspecified: Secondary | ICD-10-CM

## 2023-11-15 DIAGNOSIS — K219 Gastro-esophageal reflux disease without esophagitis: Secondary | ICD-10-CM

## 2023-11-15 MED ORDER — PANTOPRAZOLE SODIUM 40 MG PO TBEC
40.0000 mg | DELAYED_RELEASE_TABLET | Freq: Every day | ORAL | 0 refills | Status: DC
  Filled 2023-11-15: qty 30, 30d supply, fill #0

## 2023-11-15 MED ORDER — SERTRALINE HCL 100 MG PO TABS
150.0000 mg | ORAL_TABLET | Freq: Every day | ORAL | 0 refills | Status: DC
  Filled 2023-11-15: qty 45, 30d supply, fill #0

## 2023-11-16 ENCOUNTER — Other Ambulatory Visit: Payer: Self-pay

## 2023-11-16 ENCOUNTER — Other Ambulatory Visit (HOSPITAL_COMMUNITY): Payer: Self-pay

## 2023-11-16 MED ORDER — BUTALBITAL-APAP-CAFFEINE 50-300-40 MG PO CAPS
1.0000 | ORAL_CAPSULE | Freq: Three times a day (TID) | ORAL | 1 refills | Status: DC | PRN
  Filled 2023-11-16: qty 30, 5d supply, fill #0
  Filled 2024-01-14: qty 30, 5d supply, fill #1

## 2023-12-03 ENCOUNTER — Other Ambulatory Visit: Payer: Self-pay | Admitting: Family Medicine

## 2023-12-06 ENCOUNTER — Ambulatory Visit: Payer: No Typology Code available for payment source | Admitting: Family Medicine

## 2023-12-10 ENCOUNTER — Encounter: Payer: Self-pay | Admitting: Family Medicine

## 2023-12-10 ENCOUNTER — Ambulatory Visit (INDEPENDENT_AMBULATORY_CARE_PROVIDER_SITE_OTHER): Payer: No Typology Code available for payment source | Admitting: Family Medicine

## 2023-12-10 ENCOUNTER — Other Ambulatory Visit (HOSPITAL_COMMUNITY): Payer: Self-pay

## 2023-12-10 VITALS — BP 120/70 | HR 68 | Temp 98.4°F | Resp 18 | Ht 64.0 in | Wt 177.4 lb

## 2023-12-10 DIAGNOSIS — Z23 Encounter for immunization: Secondary | ICD-10-CM

## 2023-12-10 DIAGNOSIS — F419 Anxiety disorder, unspecified: Secondary | ICD-10-CM | POA: Diagnosis not present

## 2023-12-10 DIAGNOSIS — K219 Gastro-esophageal reflux disease without esophagitis: Secondary | ICD-10-CM

## 2023-12-10 DIAGNOSIS — Z0000 Encounter for general adult medical examination without abnormal findings: Secondary | ICD-10-CM

## 2023-12-10 DIAGNOSIS — E7800 Pure hypercholesterolemia, unspecified: Secondary | ICD-10-CM

## 2023-12-10 DIAGNOSIS — E2839 Other primary ovarian failure: Secondary | ICD-10-CM

## 2023-12-10 DIAGNOSIS — G4700 Insomnia, unspecified: Secondary | ICD-10-CM

## 2023-12-10 DIAGNOSIS — F418 Other specified anxiety disorders: Secondary | ICD-10-CM

## 2023-12-10 DIAGNOSIS — Z1211 Encounter for screening for malignant neoplasm of colon: Secondary | ICD-10-CM

## 2023-12-10 LAB — CBC WITH DIFFERENTIAL/PLATELET
Basophils Absolute: 0 10*3/uL (ref 0.0–0.1)
Basophils Relative: 0.6 % (ref 0.0–3.0)
Eosinophils Absolute: 0.2 10*3/uL (ref 0.0–0.7)
Eosinophils Relative: 2.5 % (ref 0.0–5.0)
HCT: 39.4 % (ref 36.0–46.0)
Hemoglobin: 13.1 g/dL (ref 12.0–15.0)
Lymphocytes Relative: 30.7 % (ref 12.0–46.0)
Lymphs Abs: 2.2 10*3/uL (ref 0.7–4.0)
MCHC: 33.2 g/dL (ref 30.0–36.0)
MCV: 94.9 fL (ref 78.0–100.0)
Monocytes Absolute: 0.6 10*3/uL (ref 0.1–1.0)
Monocytes Relative: 8 % (ref 3.0–12.0)
Neutro Abs: 4.2 10*3/uL (ref 1.4–7.7)
Neutrophils Relative %: 58.2 % (ref 43.0–77.0)
Platelets: 337 10*3/uL (ref 150.0–400.0)
RBC: 4.15 Mil/uL (ref 3.87–5.11)
RDW: 13.1 % (ref 11.5–15.5)
WBC: 7.2 10*3/uL (ref 4.0–10.5)

## 2023-12-10 LAB — COMPREHENSIVE METABOLIC PANEL
ALT: 45 U/L — ABNORMAL HIGH (ref 0–35)
AST: 31 U/L (ref 0–37)
Albumin: 4.5 g/dL (ref 3.5–5.2)
Alkaline Phosphatase: 84 U/L (ref 39–117)
BUN: 20 mg/dL (ref 6–23)
CO2: 24 meq/L (ref 19–32)
Calcium: 9.4 mg/dL (ref 8.4–10.5)
Chloride: 107 meq/L (ref 96–112)
Creatinine, Ser: 0.98 mg/dL (ref 0.40–1.20)
GFR: 60.99 mL/min (ref >60.00)
Glucose, Bld: 112 mg/dL — ABNORMAL HIGH (ref 70–99)
Potassium: 3.8 meq/L (ref 3.5–5.1)
Sodium: 141 meq/L (ref 135–145)
Total Bilirubin: 0.3 mg/dL (ref 0.2–1.2)
Total Protein: 6.9 g/dL (ref 6.0–8.3)

## 2023-12-10 LAB — LIPID PANEL
Cholesterol: 183 mg/dL (ref 0–200)
HDL: 81 mg/dL (ref >39.00)
LDL Cholesterol: 75 mg/dL (ref 0–99)
NonHDL: 101.56
Total CHOL/HDL Ratio: 2
Triglycerides: 132 mg/dL (ref 0.0–149.0)
VLDL: 26.4 mg/dL (ref 0.0–40.0)

## 2023-12-10 LAB — TSH: TSH: 2.12 u[IU]/mL (ref 0.35–5.50)

## 2023-12-10 MED ORDER — PANTOPRAZOLE SODIUM 40 MG PO TBEC
40.0000 mg | DELAYED_RELEASE_TABLET | Freq: Every day | ORAL | 3 refills | Status: DC
  Filled 2023-12-10: qty 90, 90d supply, fill #0
  Filled 2024-04-23: qty 90, 90d supply, fill #1
  Filled 2024-06-10 – 2024-07-04 (×2): qty 90, 90d supply, fill #2

## 2023-12-10 MED ORDER — FLUTICASONE PROPIONATE 50 MCG/ACT NA SUSP
2.0000 | Freq: Every day | NASAL | 5 refills | Status: AC
  Filled 2023-12-10: qty 16, 30d supply, fill #0
  Filled 2024-02-26: qty 16, 30d supply, fill #1
  Filled 2024-07-04: qty 16, 30d supply, fill #2
  Filled 2024-08-18: qty 16, 30d supply, fill #3
  Filled 2024-10-14: qty 16, 30d supply, fill #4
  Filled 2024-10-28 – 2024-11-07 (×2): qty 16, 30d supply, fill #5

## 2023-12-10 MED ORDER — HYDROXYZINE PAMOATE 50 MG PO CAPS
ORAL_CAPSULE | ORAL | 2 refills | Status: DC
  Filled 2023-12-10: qty 180, 30d supply, fill #0
  Filled 2024-04-23: qty 180, 30d supply, fill #1

## 2023-12-10 MED ORDER — SERTRALINE HCL 100 MG PO TABS
150.0000 mg | ORAL_TABLET | Freq: Every day | ORAL | 3 refills | Status: DC
  Filled 2023-12-10: qty 135, 90d supply, fill #0
  Filled 2024-06-10 – 2024-07-04 (×2): qty 135, 90d supply, fill #1

## 2023-12-10 MED ORDER — CELECOXIB 100 MG PO CAPS
100.0000 mg | ORAL_CAPSULE | Freq: Two times a day (BID) | ORAL | 3 refills | Status: DC
  Filled 2023-12-10: qty 60, 30d supply, fill #0
  Filled 2024-01-14: qty 60, 30d supply, fill #1
  Filled 2024-02-26: qty 60, 30d supply, fill #2
  Filled 2024-04-23: qty 60, 30d supply, fill #3

## 2023-12-10 MED ORDER — TRAZODONE HCL 50 MG PO TABS
ORAL_TABLET | Freq: Every evening | ORAL | 0 refills | Status: DC | PRN
  Filled 2023-12-10: qty 30, 30d supply, fill #0

## 2023-12-10 NOTE — Assessment & Plan Note (Signed)
Tolerating statin, encouraged heart healthy diet, avoid trans fats, minimize simple carbs and saturated fats. Increase exercise as tolerated 

## 2023-12-10 NOTE — Progress Notes (Signed)
;  Established Patient Office Visit  Subjective   Patient ID: [name], female    DOB: [birth year]  Age: 62 y.o. MRN: [id number]  Chief Complaint  Patient presents with   Annual Exam    Pt states fasting     HPI Discussed the use of AI scribe software for clinical note transcription with the patient, who gave verbal consent to proceed.  History of Present Illness   The patient, with a history of chronic nasal congestion, arthritis, and insomnia, presents for a routine check-up and medication refills. She reports daily use of Flonase, sometimes twice a day, despite the recommended once daily usage due to severe nasal congestion. She recently purchased a motorized nasal rinse device, which she finds more effective than a traditional neti pot.  The patient also takes Celebrex for arthritis, particularly in her hands, but reports minimal relief. She describes difficulty in fully closing her hands due to pain and stiffness, especially in the mornings. She has been trying to manage the pain with exercises and heat application.  For insomnia, the patient has a prescription for Trazodone but uses it sparingly, preferring to have it on hand for particularly difficult nights. She also takes Sertraline and Hydroxyzine, with no reported issues.  The patient has recently retired and is trying to establish a regular routine. She has started a new exercise program and has been traveling. She also engages in crafting activities, such as candle making, to keep busy. She is due for a colonoscopy, mammogram, and bone density test, which she plans to schedule soon. She is also considering getting a shingles vaccine.      Patient Active Problem List   Diagnosis Date Noted   Bradycardia 01/26/2023   Closed fracture of distal end of left radius 08/12/2022   Depression, major, single episode, severe (HCC) 09/02/2021   Strain of lumbar region 07/29/2021   Chronic migraine w/o aura w/o status migrainosus,  not intractable 12/24/2020   Trigger finger, acquired 12/15/2019   Chronic migraine 07/05/2018   Stress at home 11/09/2017   Essential hypertension 03/05/2017   Dizziness 03/05/2017   Hyperlipidemia 03/05/2017   Plantar fasciitis of right foot 02/02/2016   Chronic rhinitis 10/20/2015   Cough, persistent 10/20/2015   Neck pain 03/16/2015   Overweight 01/22/2015   Acute sinusitis 10/21/2014   Depression with anxiety 07/14/2014   Insomnia 05/28/2012   Migraine 05/28/2012   Muscle spasm 05/28/2012   HEADACHE 04/07/2009   Past Medical History:  Diagnosis Date   Allergy    seasonal allergies   Anxiety    on meds   Arthritis    on meds   Depression    on meds   Dizziness 03/05/2017   Essential hypertension 03/05/2017   on meds   GERD (gastroesophageal reflux disease)    on meds   Hyperlipidemia 03/05/2017   on meds   Infertility, female    Migraines    Osteopenia    Past Surgical History:  Procedure Laterality Date   ANKLE SURGERY Left 1979   COLONOSCOPY  2011   MS-F/V-moviprep(exc)-normal -10 yr recall   SHOULDER SURGERY Left 1990   Social History   Tobacco Use   Smoking status: Former    Current packs/day: 0.00    Types: Cigarettes    Quit date: 02/09/1992    Years since quitting: 31.8   Smokeless tobacco: Never  Vaping Use   Vaping status: Never Used  Substance Use Topics   Alcohol use: Yes  Alcohol/week: 12.0 standard drinks of alcohol    Types: 12 Standard drinks or equivalent per week   Drug use: No   Social History   Socioeconomic History   Marital status: Legally Separated    Spouse name: Not on file   Number of children: Not on file   Years of education: Not on file   Highest education level: Not on file  Occupational History    Employer: [employer]    Comment: [employer]  Tobacco Use   Smoking status: Former    Current packs/day: 0.00    Types: Cigarettes    Quit date: 02/09/1992    Years since quitting: 31.8   Smokeless  tobacco: Never  Vaping Use   Vaping status: Never Used  Substance and Sexual Activity   Alcohol use: Yes    Alcohol/week: 12.0 standard drinks of alcohol    Types: 12 Standard drinks or equivalent per week   Drug use: No   Sexual activity: Yes    Partners: Male  Other Topics Concern   Not on file  Social History Narrative   Exercuse --- chair yoga and hand weight    Social Drivers of Corporate investment banker Strain: Not on file  Food Insecurity: Not on file  Transportation Needs: Not on file  Physical Activity: Not on file  Stress: Not on file  Social Connections: Not on file  Intimate Partner Violence: Not on file   Family Status  Relation Name Status   Mother  Alive   MGM  (Not Specified)   Other  (Not Specified)   Daughter  (Not Specified)   Father  Deceased   MGF  (Not Specified)   PGF  (Not Specified)   Neg Hx  (Not Specified)  No partnership data on file   Family History  Problem Relation Age of Onset   Hypertension Mother    Allergic rhinitis Mother    Sleep apnea Mother    Hyperlipidemia Mother    Cancer Maternal Grandmother    Stroke Other    Eczema Daughter    Alcohol abuse Father    Stroke Father    Heart disease Maternal Grandfather    Alcohol abuse Paternal Grandfather    Colon polyps Neg Hx    Colon cancer Neg Hx    Esophageal cancer Neg Hx    Rectal cancer Neg Hx    Stomach cancer Neg Hx    Allergies  Allergen Reactions   Augmentin [Amoxicillin-Pot Clavulanate]     diarrhea      Review of Systems  Constitutional:  Negative for fever and malaise/fatigue.  HENT:  Negative for congestion.   Eyes:  Negative for blurred vision.  Respiratory:  Negative for cough and shortness of breath.   Cardiovascular:  Negative for chest pain, palpitations and leg swelling.  Gastrointestinal:  Negative for abdominal pain, blood in stool, nausea and vomiting.  Genitourinary:  Negative for dysuria and frequency.  Musculoskeletal:  Negative for back  pain and falls.  Skin:  Negative for rash.  Neurological:  Negative for dizziness, loss of consciousness and headaches.  Endo/Heme/Allergies:  Negative for environmental allergies.  Psychiatric/Behavioral:  Negative for depression. The patient is not nervous/anxious.       Objective:     BP 120/70 (BP Location: Left Arm, Patient Position: Sitting, Cuff Size: Normal)   Pulse 68   Temp 98.4 F (36.9 C) (Oral)   Resp 18   Ht 5\' 4"  (1.626 m)  Wt 177 lb 6.4 oz (80.5 kg)   SpO2 97%   BMI 30.45 kg/m  BP Readings from Last 3 Encounters:  12/10/23 120/70  10/12/23 109/72  08/14/23 (!) 149/84   Wt Readings from Last 3 Encounters:  12/10/23 177 lb 6.4 oz (80.5 kg)  10/12/23 173 lb 9.6 oz (78.7 kg)  08/14/23 172 lb (78 kg)   SpO2 Readings from Last 3 Encounters:  12/10/23 97%  02/26/23 97%  01/31/23 99%      Physical Exam Vitals and nursing note reviewed.  Constitutional:      General: She is not in acute distress.    Appearance: Normal appearance. She is well-developed.  HENT:     Head: Normocephalic and atraumatic.     Right Ear: Tympanic membrane, ear canal and external ear normal. There is no impacted cerumen.     Left Ear: Tympanic membrane, ear canal and external ear normal. There is no impacted cerumen.     Nose: Nose normal.     Mouth/Throat:     Mouth: Mucous membranes are moist.     Pharynx: Oropharynx is clear. No oropharyngeal exudate or posterior oropharyngeal erythema.  Eyes:     General: No scleral icterus.       Right eye: No discharge.        Left eye: No discharge.     Conjunctiva/sclera: Conjunctivae normal.     Pupils: Pupils are equal, round, and reactive to light.  Neck:     Thyroid: No thyromegaly or thyroid tenderness.     Vascular: No JVD.  Cardiovascular:     Rate and Rhythm: Normal rate and regular rhythm.     Heart sounds: Normal heart sounds. No murmur heard. Pulmonary:     Effort: Pulmonary effort is normal. No respiratory distress.      Breath sounds: Normal breath sounds.  Abdominal:     General: Bowel sounds are normal. There is no distension.     Palpations: Abdomen is soft. There is no mass.     Tenderness: There is no abdominal tenderness. There is no guarding or rebound.  Genitourinary:    Vagina: Normal.  Musculoskeletal:        General: Normal range of motion.     Cervical back: Normal range of motion and neck supple.     Right lower leg: No edema.     Left lower leg: No edema.  Lymphadenopathy:     Cervical: No cervical adenopathy.  Skin:    General: Skin is warm and dry.     Findings: No erythema or rash.  Neurological:     Mental Status: She is alert and oriented to person, place, and time.     Cranial Nerves: No cranial nerve deficit.     Deep Tendon Reflexes: Reflexes are normal and symmetric.  Psychiatric:        Mood and Affect: Mood normal.        Behavior: Behavior normal.        Thought Content: Thought content normal.        Judgment: Judgment normal.      No results found for any visits on 12/10/23.  qLast CBC Lab Results  Component Value Date   WBC 5.9 09/04/2022   HGB 12.3 09/04/2022   HCT 36.9 09/04/2022   MCV 95.3 09/04/2022   MCH 30.2 11/26/2018   RDW 13.2 09/04/2022   PLT 302.0 09/04/2022   Last metabolic panel Lab Results  Component Value Date   GLUCOSE  97 08/08/2023   NA 141 08/08/2023   K 4.0 08/08/2023   CL 107 (H) 08/08/2023   CO2 19 (L) 08/08/2023   BUN 19 08/08/2023   CREATININE 0.85 08/08/2023   EGFR 76 08/08/2023   CALCIUM 9.1 08/08/2023   PROT 6.9 08/08/2023   ALBUMIN 4.5 08/08/2023   LABGLOB 2.4 08/08/2023   AGRATIO 2.2 03/24/2020   BILITOT 0.3 08/08/2023   ALKPHOS 90 08/08/2023   AST 30 08/08/2023   ALT 39 (H) 08/08/2023   Last lipids Lab Results  Component Value Date   CHOL 175 08/08/2023   HDL 62 08/08/2023   LDLCALC 74 08/08/2023   LDLDIRECT 177.0 09/04/2022   TRIG 242 (H) 08/08/2023   CHOLHDL 2.8 08/08/2023   Last hemoglobin  A1c Lab Results  Component Value Date   HGBA1C 5.8 (H) 08/14/2023   Last thyroid functions Lab Results  Component Value Date   TSH 2.17 09/04/2022   Last vitamin D No results found for: "25OHVITD2", "25OHVITD3", "VD25OH" Last vitamin B12 and Folate No results found for: "VITAMINB12", "FOLATE"    The 10-year ASCVD risk score (Arnett DK, et al., 2019) is: 5.2%    Assessment & Plan:   Problem List Items Addressed This Visit       Unprioritized   Insomnia   Relevant Medications   traZODone (DESYREL) 50 MG tablet   Depression with anxiety   Relevant Medications   hydrOXYzine (VISTARIL) 50 MG capsule   sertraline (ZOLOFT) 100 MG tablet   traZODone (DESYREL) 50 MG tablet   RESOLVED: Preventative health care - Primary   Ghm utd Check labs  See AVS Health Maintenance  Topic Date Due   Zoster Vaccines- Shingrix (1 of 2) Never done   Colonoscopy  12/11/2018   INFLUENZA VACCINE  07/12/2023   MAMMOGRAM  09/29/2023   COVID-19 Vaccine (4 - 2024-25 season) 12/26/2023 (Originally 08/12/2023)   Cervical Cancer Screening (HPV/Pap Cotest)  09/02/2024   DTaP/Tdap/Td (2 - Td or Tdap) 02/23/2030   Hepatitis C Screening  Completed   HIV Screening  Completed   HPV VACCINES  Aged Out         Relevant Orders   CBC with Differential/Platelet   Comprehensive metabolic panel   Lipid panel   TSH   Hyperlipidemia   Tolerating statin, encouraged heart healthy diet, avoid trans fats, minimize simple carbs and saturated fats. Increase exercise as tolerated       Other Visit Diagnoses       Gastroesophageal reflux disease       Relevant Medications   pantoprazole (PROTONIX) 40 MG tablet     Anxiety       Relevant Medications   hydrOXYzine (VISTARIL) 50 MG capsule   sertraline (ZOLOFT) 100 MG tablet   traZODone (DESYREL) 50 MG tablet     Colon cancer screening       Relevant Orders   Ambulatory referral to Gastroenterology     Estrogen deficiency       Relevant Orders   DG Bone  Density     Need for influenza vaccination       Relevant Orders   Flu vaccine trivalent PF, 6mos and older(Flulaval,Afluria,Fluarix,Fluzone) (Completed)     Assessment and Plan    Osteoarthritis of the Hands   She exhibits severe arthritis in her hands, experiencing difficulty making a fist, significant morning stiffness, and pain despite being prescribed Celebrex 100 mg twice daily. She has not been taking the evening dose consistently. We emphasized the importance  of adhering to the medication schedule and introduced additional therapies. We will maintain Celebrex 100 mg twice daily, add Tylenol Arthritis as needed for pain, and recommend heat therapy, such as paraffin wax treatments at home, to alleviate symptoms.  Allergic Rhinitis   She reports chronic nasal congestion and has been using Flonase daily, occasionally twice, against recommendations. The recent addition of a motorized nasal rinse device has provided relief. We will continue Flonase once daily and encourage the ongoing use of the nasal rinse device for symptom management.  Medication Management   She requires refills for sertraline, Flonase, Celebrex, rosuvastatin, trazodone, and hydroxyzine. Although she rarely uses trazodone, she prefers to keep it available. We will refill all requested medications to ensure continuity of care.  General Health Maintenance   She is due for several routine health screenings and vaccinations. Having recently retired and become inactive, she has initiated a new exercise program. We discussed the benefits of regular physical activity for preventing stiffness and maintaining overall health. We will schedule a colonoscopy, a mammogram at the breast center, and a bone density scan. We will administer the flu shot today and recommend getting the shingles vaccine at [facility], advising against receiving it on the same day as the flu shot. Regular physical activity is encouraged to prevent  stiffness and promote general well-being.  Follow-up   We administered the flu shot today and will send her to the lab for routine blood work to monitor her overall health status.        No follow-ups on file.    Donato Schultz, DO

## 2023-12-10 NOTE — Assessment & Plan Note (Signed)
Ghm utd Check labs  See AVS Health Maintenance  Topic Date Due   Zoster Vaccines- Shingrix (1 of 2) Never done   Colonoscopy  12/11/2018   INFLUENZA VACCINE  07/12/2023   MAMMOGRAM  09/29/2023   COVID-19 Vaccine (4 - 2024-25 season) 12/26/2023 (Originally 08/12/2023)   Cervical Cancer Screening (HPV/Pap Cotest)  09/02/2024   DTaP/Tdap/Td (2 - Td or Tdap) 02/23/2030   Hepatitis C Screening  Completed   HIV Screening  Completed   HPV VACCINES  Aged Out

## 2023-12-20 ENCOUNTER — Other Ambulatory Visit: Payer: Self-pay

## 2023-12-20 NOTE — Progress Notes (Signed)
 Specialty Pharmacy Refill Coordination Note  [name] is a 63 y.o. female contacted today regarding refills of specialty medication(s) OnabotulinumtoxinA  (Botox )   Patient requested Courier to Provider Office   Delivery date: 01/08/24   Verified address: [facility]   [address] [place]   [place] [address]   Medication will be filled on 01/07/24.   Pending refill request

## 2024-01-08 ENCOUNTER — Other Ambulatory Visit: Payer: Self-pay

## 2024-01-08 ENCOUNTER — Other Ambulatory Visit: Payer: Self-pay | Admitting: Physician Assistant

## 2024-01-09 ENCOUNTER — Other Ambulatory Visit (HOSPITAL_COMMUNITY): Payer: Self-pay

## 2024-01-10 ENCOUNTER — Other Ambulatory Visit: Payer: Self-pay

## 2024-01-11 ENCOUNTER — Other Ambulatory Visit: Payer: Self-pay

## 2024-01-11 ENCOUNTER — Other Ambulatory Visit: Payer: Self-pay | Admitting: Physician Assistant

## 2024-01-14 ENCOUNTER — Other Ambulatory Visit: Payer: Self-pay | Admitting: Physician Assistant

## 2024-01-14 ENCOUNTER — Other Ambulatory Visit (HOSPITAL_COMMUNITY): Payer: Self-pay

## 2024-01-14 ENCOUNTER — Other Ambulatory Visit: Payer: Self-pay

## 2024-01-15 ENCOUNTER — Other Ambulatory Visit (HOSPITAL_COMMUNITY): Payer: Self-pay

## 2024-01-17 ENCOUNTER — Other Ambulatory Visit (HOSPITAL_COMMUNITY): Payer: Self-pay

## 2024-01-18 ENCOUNTER — Other Ambulatory Visit (HOSPITAL_COMMUNITY): Payer: Self-pay

## 2024-01-18 ENCOUNTER — Other Ambulatory Visit: Payer: Self-pay

## 2024-01-18 MED ORDER — BOTOX 100 UNITS IJ SOLR
INTRAMUSCULAR | 3 refills | Status: DC
  Filled 2024-01-18: qty 1, 84d supply, fill #0
  Filled 2024-02-26 – 2024-06-10 (×3): qty 1, 84d supply, fill #1

## 2024-01-18 MED ORDER — NURTEC 75 MG PO TBDP
1.0000 | ORAL_TABLET | ORAL | 11 refills | Status: AC
  Filled 2024-01-18 (×2): qty 15, 30d supply, fill #0
  Filled 2024-02-26: qty 15, 30d supply, fill #1
  Filled 2024-04-23: qty 15, 30d supply, fill #2
  Filled 2024-05-19 – 2024-08-18 (×5): qty 15, 30d supply, fill #3

## 2024-01-18 NOTE — Progress Notes (Signed)
 Refill approved. Courier to [facility] 2/10.

## 2024-01-21 ENCOUNTER — Other Ambulatory Visit (HOSPITAL_COMMUNITY): Payer: Self-pay

## 2024-01-21 ENCOUNTER — Other Ambulatory Visit: Payer: Self-pay

## 2024-01-21 NOTE — Progress Notes (Signed)
 [name] called from [employer] at [facility] and reported she received Botox  delivery for patient instead of the intended [facility].   Called and spoke with [facility] for [facility] and confirmed patient will have appointment this month.   Confirmed addresses:  [facility]: [address] [facility]: [address]  [name] said she would take it over to [facility] today.   Updated Address in [facility] to reflect name of clinic.

## 2024-02-07 ENCOUNTER — Other Ambulatory Visit: Payer: Self-pay

## 2024-02-07 ENCOUNTER — Ambulatory Visit: Payer: No Typology Code available for payment source | Admitting: Family Medicine

## 2024-02-07 VITALS — BP 128/84 | HR 93 | Ht 64.0 in | Wt 177.0 lb

## 2024-02-07 DIAGNOSIS — M79641 Pain in right hand: Secondary | ICD-10-CM | POA: Diagnosis not present

## 2024-02-07 DIAGNOSIS — M65311 Trigger thumb, right thumb: Secondary | ICD-10-CM | POA: Diagnosis not present

## 2024-02-07 NOTE — Patient Instructions (Signed)
 Thank you for coming in today.  You received an injection today. Seek immediate medical attention if the joint becomes red, extremely painful, or is oozing fluid.  Double Band-Aid Splint

## 2024-02-07 NOTE — Progress Notes (Unsigned)
   Rubin Payor, PhD, LAT, ATC acting as a scribe for [name], MD.  [name] is a 63 y.o. female who presents to [facility] today for R hand pain. Pt was previously seen by Dr. Denyse Amass for a L distal radius fx.  Today, pt c/o R hand pain ongoing for about 2 months. Pt locates pain to the IP joint gets "stuck" and the MCP joint of her R thumb is very painful. She is RHD.   Aggravates: opening a door or jar,  Treatments tried: Tylenol, paraffin wax, creams w/ essential oil, Voltaren gel, massage, heat, ice, taping  Pertinent review of systems: No fevers or chills  Relevant historical information: Hypertension   Exam:  BP 128/84   Pulse 93   Ht 5\' 4"  (1.626 m)   Wt 177 lb (80.3 kg)   SpO2 95%   BMI 30.38 kg/m  General: Well Developed, well nourished, and in no acute distress.   MSK: Right hand some swelling at first MCP palmar aspect.  Triggering present with flexion of IP joint. Tender palpation at MCP.   Lab and Radiology Results  Procedure: Real-time Ultrasound Guided Injection of right hand first MCP A1 pulley tendon sheath (trigger thumb injection) Device: Philips Affiniti 50G/GE Logiq Images permanently stored and available for review in PACS Verbal informed consent obtained.  Discussed risks and benefits of procedure. Warned about infection, bleeding, hyperglycemia damage to structures among others. Patient expresses understanding and agreement Time-out conducted.   Noted no overlying erythema, induration, or other signs of local infection.   Skin prepped in a sterile fashion.   Local anesthesia: Topical Ethyl chloride.   With sterile technique and under real time ultrasound guidance: 40 mg of Kenalog and 1 mL of Marcaine injected into A1 pulley at MCP thumb. Fluid seen entering the tendon sheath.   Completed without difficulty   Pain immediately resolved suggesting accurate placement of the medication.   Advised to call if  fevers/chills, erythema, induration, drainage, or persistent bleeding.   Images permanently stored and available for review in the ultrasound unit.  Impression: Technically successful ultrasound guided injection.         Assessment and Plan: 63 y.o. female with right trigger thumb.  Plan for injection today and double Band-Aid splint recheck as needed.   PDMP not reviewed this encounter. Orders Placed This Encounter  Procedures   [place] LIMITED JOINT SPACE STRUCTURES UP RIGHT(NO LINKED CHARGES)    Reason for Exam (SYMPTOM  OR DIAGNOSIS REQUIRED):   right trigger finger    Preferred imaging location?:   [facility]   No orders of the defined types were placed in this encounter.    Discussed warning signs or symptoms. Please see discharge instructions. Patient expresses understanding.   The above documentation has been reviewed and is accurate and complete [name], M.D.

## 2024-02-26 ENCOUNTER — Other Ambulatory Visit: Payer: Self-pay | Admitting: Physician Assistant

## 2024-02-26 ENCOUNTER — Other Ambulatory Visit: Payer: Self-pay | Admitting: Cardiovascular Disease

## 2024-02-26 ENCOUNTER — Other Ambulatory Visit (HOSPITAL_COMMUNITY): Payer: Self-pay

## 2024-02-26 ENCOUNTER — Other Ambulatory Visit: Payer: Self-pay

## 2024-02-26 DIAGNOSIS — M62838 Other muscle spasm: Secondary | ICD-10-CM

## 2024-02-26 DIAGNOSIS — G43009 Migraine without aura, not intractable, without status migrainosus: Secondary | ICD-10-CM

## 2024-02-26 DIAGNOSIS — G43011 Migraine without aura, intractable, with status migrainosus: Secondary | ICD-10-CM

## 2024-02-26 DIAGNOSIS — E785 Hyperlipidemia, unspecified: Secondary | ICD-10-CM

## 2024-02-26 MED ORDER — ROSUVASTATIN CALCIUM 40 MG PO TABS
40.0000 mg | ORAL_TABLET | Freq: Every day | ORAL | 2 refills | Status: DC
  Filled 2024-02-26: qty 90, 90d supply, fill #0
  Filled 2024-06-10 – 2024-07-04 (×2): qty 90, 90d supply, fill #1
  Filled 2024-10-14: qty 90, 90d supply, fill #2

## 2024-02-26 MED ORDER — EZETIMIBE 10 MG PO TABS
10.0000 mg | ORAL_TABLET | Freq: Every day | ORAL | 2 refills | Status: DC
  Filled 2024-02-26: qty 90, 90d supply, fill #0
  Filled 2024-06-10 – 2024-07-04 (×2): qty 90, 90d supply, fill #1
  Filled 2024-10-14: qty 90, 90d supply, fill #2

## 2024-02-27 ENCOUNTER — Other Ambulatory Visit: Payer: Self-pay

## 2024-03-07 ENCOUNTER — Other Ambulatory Visit (HOSPITAL_COMMUNITY): Payer: Self-pay

## 2024-03-07 ENCOUNTER — Other Ambulatory Visit: Payer: Self-pay

## 2024-03-07 MED ORDER — NARATRIPTAN HCL 2.5 MG PO TABS
ORAL_TABLET | ORAL | 11 refills | Status: AC
  Filled 2024-03-07: qty 9, 5d supply, fill #0
  Filled 2024-06-10 – 2024-08-18 (×2): qty 9, 5d supply, fill #1
  Filled 2024-10-28: qty 9, 5d supply, fill #2

## 2024-03-07 MED ORDER — BACLOFEN 10 MG PO TABS
ORAL_TABLET | Freq: Three times a day (TID) | ORAL | 4 refills | Status: AC | PRN
  Filled 2024-03-07: qty 60, 20d supply, fill #0
  Filled 2024-05-19: qty 60, 20d supply, fill #1
  Filled 2024-06-10 – 2024-09-15 (×2): qty 60, 20d supply, fill #2
  Filled 2024-10-14: qty 60, 20d supply, fill #3
  Filled 2024-12-12: qty 60, 20d supply, fill #4

## 2024-03-14 ENCOUNTER — Encounter: Payer: Self-pay | Admitting: Physician Assistant

## 2024-03-14 ENCOUNTER — Ambulatory Visit: Payer: No Typology Code available for payment source | Admitting: Physician Assistant

## 2024-03-14 VITALS — BP 145/83 | HR 70 | Wt 178.0 lb

## 2024-03-14 DIAGNOSIS — M62838 Other muscle spasm: Secondary | ICD-10-CM

## 2024-03-14 DIAGNOSIS — G43709 Chronic migraine without aura, not intractable, without status migrainosus: Secondary | ICD-10-CM | POA: Diagnosis not present

## 2024-03-14 MED ORDER — ONABOTULINUMTOXINA 100 UNITS IJ SOLR
100.0000 [IU] | Freq: Once | INTRAMUSCULAR | Status: AC
  Administered 2024-03-14: 100 [IU] via INTRAMUSCULAR

## 2024-03-14 MED ORDER — BUTALBITAL-APAP-CAFFEINE 50-300-40 MG PO CAPS
1.0000 | ORAL_CAPSULE | Freq: Three times a day (TID) | ORAL | 1 refills | Status: DC | PRN
Start: 2024-03-14 — End: 2024-06-20

## 2024-03-14 NOTE — Progress Notes (Signed)
 CC: Botox   S: Pt in office today for Botox injections. Her Botox has been working very well for migraine prevention except that she is a bit delayed this time.  She has had a headache for 3 weeks as a result of being overdue.   O: BP (!) 145/83   Pulse 70   Wt 178 lb (80.7 kg)   BMI 30.55 kg/m    Botox Procedure Note Vial of Botox was : available in fridge  Botox Dosing by [employer] for Chronic Migraine   Injection Sites for Migraines  Botox 100 units was injected using the dosage in the table above in the pattern shown above.  A: Migraine  Muscle spasm   P: Botox 100 units injected today.  Monitor BP Fioricet refilled as requested.   RTC 3 Months.

## 2024-03-25 ENCOUNTER — Other Ambulatory Visit (HOSPITAL_COMMUNITY): Payer: Self-pay

## 2024-03-25 MED ORDER — BUTALBITAL-APAP-CAFFEINE 50-300-40 MG PO CAPS
1.0000 | ORAL_CAPSULE | Freq: Three times a day (TID) | ORAL | 1 refills | Status: DC | PRN
Start: 2024-03-14 — End: 2024-06-25
  Filled 2024-03-25: qty 30, 5d supply, fill #0
  Filled 2024-05-19: qty 30, 5d supply, fill #1

## 2024-03-27 ENCOUNTER — Other Ambulatory Visit: Payer: Self-pay

## 2024-03-30 ENCOUNTER — Telehealth: Admitting: Physician Assistant

## 2024-03-30 DIAGNOSIS — J019 Acute sinusitis, unspecified: Secondary | ICD-10-CM

## 2024-03-30 DIAGNOSIS — B9789 Other viral agents as the cause of diseases classified elsewhere: Secondary | ICD-10-CM

## 2024-03-30 MED ORDER — AZELASTINE HCL 0.1 % NA SOLN
2.0000 | Freq: Two times a day (BID) | NASAL | 12 refills | Status: AC
  Filled 2024-03-30: qty 30, 50d supply, fill #0

## 2024-03-30 NOTE — Progress Notes (Signed)
.  etime

## 2024-03-30 NOTE — Progress Notes (Signed)
E-Visit for Sinus Problems  We are sorry that you are not feeling well.  Here is how we plan to help!  Based on what you have shared with me it looks like you have sinusitis.  Sinusitis is inflammation and infection in the sinus cavities of the head.  Based on your presentation I believe you most likely have Acute Viral Sinusitis.This is an infection most likely caused by a virus. There is not specific treatment for viral sinusitis other than to help you with the symptoms until the infection runs its course.  You may use an oral decongestant such as Mucinex D or if you have glaucoma or high blood pressure use plain Mucinex. Saline nasal spray help and can safely be used as often as needed for congestion, I have prescribed: Azelastine nasal spray 2 sprays in each nostril twice a day  Some authorities believe that zinc sprays or the use of Echinacea may shorten the course of your symptoms.  Sinus infections are not as easily transmitted as other respiratory infection, however we still recommend that you avoid close contact with loved ones, especially the very young and elderly.  Remember to wash your hands thoroughly throughout the day as this is the number one way to prevent the spread of infection!  Home Care: Only take medications as instructed by your medical team. Do not take these medications with alcohol. A steam or ultrasonic humidifier can help congestion.  You can place a towel over your head and breathe in the steam from hot water coming from a faucet. Avoid close contacts especially the very young and the elderly. Cover your mouth when you cough or sneeze. Always remember to wash your hands.  Get Help Right Away If: You develop worsening fever or sinus pain. You develop a severe head ache or visual changes. Your symptoms persist after you have completed your treatment plan.  Make sure you Understand these instructions. Will watch your condition. Will get help right away if you are  not doing well or get worse.   Thank you for choosing an e-visit.  Your e-visit answers were reviewed by a board certified advanced clinical practitioner to complete your personal care plan. Depending upon the condition, your plan could have included both over the counter or prescription medications.  Please review your pharmacy choice. Make sure the pharmacy is open so you can pick up prescription now. If there is a problem, you may contact your provider through MyChart messaging and have the prescription routed to another pharmacy.  Your safety is important to us. If you have drug allergies check your prescription carefully.   For the next 24 hours you can use MyChart to ask questions about today's visit, request a non-urgent call back, or ask for a work or school excuse. You will get an email in the next two days asking about your experience. I hope that your e-visit has been valuable and will speed your recovery.   

## 2024-03-31 ENCOUNTER — Other Ambulatory Visit: Payer: Self-pay

## 2024-03-31 ENCOUNTER — Other Ambulatory Visit (HOSPITAL_COMMUNITY): Payer: Self-pay

## 2024-04-09 NOTE — Progress Notes (Signed)
 I have spent 5 minutes in review of e-visit questionnaire, review and updating patient chart, medical decision making and response to patient.   Laure Kidney, PA-C

## 2024-04-23 ENCOUNTER — Other Ambulatory Visit: Payer: Self-pay

## 2024-04-23 ENCOUNTER — Other Ambulatory Visit (HOSPITAL_COMMUNITY): Payer: Self-pay

## 2024-04-23 ENCOUNTER — Other Ambulatory Visit: Payer: Self-pay | Admitting: Physician Assistant

## 2024-04-23 DIAGNOSIS — G43809 Other migraine, not intractable, without status migrainosus: Secondary | ICD-10-CM

## 2024-04-24 ENCOUNTER — Other Ambulatory Visit (HOSPITAL_COMMUNITY): Payer: Self-pay

## 2024-05-02 ENCOUNTER — Other Ambulatory Visit (HOSPITAL_BASED_OUTPATIENT_CLINIC_OR_DEPARTMENT_OTHER): Payer: Self-pay

## 2024-05-02 ENCOUNTER — Other Ambulatory Visit (HOSPITAL_COMMUNITY): Payer: Self-pay

## 2024-05-02 MED ORDER — TOPIRAMATE 200 MG PO TABS
ORAL_TABLET | Freq: Every day | ORAL | 11 refills | Status: AC
  Filled 2024-05-02 – 2024-08-18 (×3): qty 30, 30d supply, fill #0
  Filled 2024-10-14: qty 30, 30d supply, fill #1
  Filled 2024-12-12: qty 30, 30d supply, fill #2

## 2024-05-12 ENCOUNTER — Other Ambulatory Visit (HOSPITAL_COMMUNITY): Payer: Self-pay

## 2024-05-19 ENCOUNTER — Encounter: Payer: Self-pay | Admitting: Family Medicine

## 2024-05-19 ENCOUNTER — Other Ambulatory Visit: Payer: Self-pay

## 2024-05-19 ENCOUNTER — Other Ambulatory Visit (HOSPITAL_COMMUNITY): Payer: Self-pay

## 2024-05-19 ENCOUNTER — Ambulatory Visit (INDEPENDENT_AMBULATORY_CARE_PROVIDER_SITE_OTHER)

## 2024-05-19 ENCOUNTER — Other Ambulatory Visit: Payer: Self-pay | Admitting: Family Medicine

## 2024-05-19 ENCOUNTER — Ambulatory Visit (INDEPENDENT_AMBULATORY_CARE_PROVIDER_SITE_OTHER): Admitting: Family Medicine

## 2024-05-19 VITALS — BP 118/80 | HR 74 | Ht 64.0 in | Wt 179.0 lb

## 2024-05-19 DIAGNOSIS — G8929 Other chronic pain: Secondary | ICD-10-CM

## 2024-05-19 DIAGNOSIS — M25572 Pain in left ankle and joints of left foot: Secondary | ICD-10-CM | POA: Diagnosis not present

## 2024-05-19 NOTE — Progress Notes (Signed)
   I, Miquel Amen, CMA acting as a scribe for Garlan Juniper, MD.  [name] is a 63 y.o. female who presents to [facility] at [facility] today for L ankle pain. Pt was previously seen by Dr. Alease Hunter on 02/07/24 for R trigger thumb.  Today, pt c/o L ankle pain. She notes having a fx in 1978 w/ ORIF. Notes that the pin broke off in the bone when the hardware was being removed. Pt locates pain to medial aspect of the ankle and lower leg.. Sx exacerbated by recently starting to increase exercise. Notes that the bone feels weak, like its going to snap, also feels unstable. Intermittent swelling.   Swelling; intermittently Aggravates: walking > 1 mile Treatments tried: ACE wrap, ice, elevation, topical CBD, massage  Pertinent review of systems: No fevers or chills  Relevant historical information: Migraine and hypertension   Exam:  BP 118/80   Pulse 74   Ht 5\' 4"  (1.626 m)   Wt 179 lb (81.2 kg)   SpO2 98%   BMI 30.73 kg/m  General: Well Developed, well nourished, and in no acute distress.   MSK: Left ankle no swelling.  Mildly tender palpation at distal tibia.  Normal foot and ankle motion and intact strength. Stable ligamentous exam.    Lab and Radiology Results  X-ray images left ankle obtained today personally and independently interpreted. Retained surgical hardware in the medial malleolus.  Minimal arthritis changes.  No acute fractures are visible. Await formal radiology review.   Diagnostic Limited MSK Ultrasound of: Left ankle Normal-appearing bone cortex area of pain at the distal tibia. No significant joint effusion is present. Posterior tibialis tendon is intact with mild hypoechoic fluid at the tarsal tunnel consistent with mild tenosynovitis Impression: Tenosynovitis posterior tibialis tendon   Assessment and Plan: 63 y.o. female with left ankle pain worsening with increasing levels of activity.  She had a ankle fracture bad enough to needs  surgery and traction in the late 70s.  She does have a little bit of arthritis and some tendinitis.  Plan to treat conservatively with home exercise program targeting the posterior tibialis tendon and compression sleeve and Voltaren gel.  If not improving she will let me know and I will add physical therapy.  Consider injection if that does not work.   PDMP not reviewed this encounter. Orders Placed This Encounter  Procedures   US  LIMITED JOINT SPACE STRUCTURES LOW LEFT(NO LINKED CHARGES)    Reason for Exam (SYMPTOM  OR DIAGNOSIS REQUIRED):   left ankle pain    Preferred imaging location?:   [facility] Ankle Complete Left    Standing Status:   Future    Number of Occurrences:   1    Expiration Date:   05/19/2025    Reason for Exam (SYMPTOM  OR DIAGNOSIS REQUIRED):   left ankle pain    Preferred imaging location?:    [facility]   No orders of the defined types were placed in this encounter.    Discussed warning signs or symptoms. Please see discharge instructions. Patient expresses understanding.   The above documentation has been reviewed and is accurate and complete Garlan Juniper, M.D.

## 2024-05-19 NOTE — Patient Instructions (Addendum)
 Thank you for coming in today.   Please get an Xray today before you leave   Recommend Body Helix Compression Sleeve  Please use Voltaren gel (Generic Diclofenac Gel) up to 4x daily for pain as needed.  This is available over-the-counter as both the name brand Voltaren gel and the generic diclofenac gel.   View at my-[email or web address]-[email or web address] code [phone]

## 2024-05-20 ENCOUNTER — Other Ambulatory Visit: Payer: Self-pay

## 2024-05-20 ENCOUNTER — Other Ambulatory Visit (HOSPITAL_COMMUNITY): Payer: Self-pay

## 2024-05-20 MED ORDER — CELECOXIB 100 MG PO CAPS
100.0000 mg | ORAL_CAPSULE | Freq: Two times a day (BID) | ORAL | 0 refills | Status: DC
  Filled 2024-05-20: qty 60, 30d supply, fill #0

## 2024-05-21 ENCOUNTER — Ambulatory Visit: Payer: Self-pay | Admitting: Family Medicine

## 2024-05-21 ENCOUNTER — Other Ambulatory Visit (HOSPITAL_COMMUNITY): Payer: Self-pay

## 2024-05-21 NOTE — Progress Notes (Signed)
 Left ankle x-ray shows medium to severe arthritis of the ankle joint.

## 2024-05-30 ENCOUNTER — Other Ambulatory Visit: Payer: Self-pay

## 2024-06-10 ENCOUNTER — Other Ambulatory Visit: Payer: Self-pay

## 2024-06-10 ENCOUNTER — Other Ambulatory Visit (HOSPITAL_COMMUNITY): Payer: Self-pay

## 2024-06-10 NOTE — Progress Notes (Signed)
 Specialty Pharmacy Refill Coordination Note  [name] is a 63 y.o. female assessed today regarding refills of clinic administered specialty medication(s) OnabotulinumtoxinA  (Botox )   Clinic requested Courier to Provider Office   Delivery date: 06/11/24  Injection date: 06/20/24   Verified address: [facility]   [address] [place] [address]   [place] [address]   Medication will be filled on 06/10/24.

## 2024-06-20 ENCOUNTER — Encounter: Payer: Self-pay | Admitting: Physician Assistant

## 2024-06-20 ENCOUNTER — Other Ambulatory Visit: Payer: Self-pay

## 2024-06-20 ENCOUNTER — Other Ambulatory Visit (HOSPITAL_COMMUNITY): Payer: Self-pay

## 2024-06-20 ENCOUNTER — Ambulatory Visit: Admitting: Physician Assistant

## 2024-06-20 VITALS — BP 87/54 | HR 69 | Wt 183.0 lb

## 2024-06-20 DIAGNOSIS — M542 Cervicalgia: Secondary | ICD-10-CM | POA: Diagnosis not present

## 2024-06-20 DIAGNOSIS — M62838 Other muscle spasm: Secondary | ICD-10-CM

## 2024-06-20 DIAGNOSIS — G43709 Chronic migraine without aura, not intractable, without status migrainosus: Secondary | ICD-10-CM | POA: Diagnosis not present

## 2024-06-20 DIAGNOSIS — G43001 Migraine without aura, not intractable, with status migrainosus: Secondary | ICD-10-CM | POA: Diagnosis not present

## 2024-06-20 MED ORDER — TIZANIDINE HCL 4 MG PO TABS
4.0000 mg | ORAL_TABLET | Freq: Three times a day (TID) | ORAL | 2 refills | Status: DC
  Filled 2024-06-20 (×2): qty 90, 30d supply, fill #0
  Filled 2024-08-18: qty 90, 30d supply, fill #1
  Filled 2024-10-14: qty 90, 30d supply, fill #2

## 2024-06-20 MED ORDER — ONABOTULINUMTOXINA 100 UNITS IJ SOLR
100.0000 [IU] | Freq: Once | INTRAMUSCULAR | Status: AC
  Administered 2024-06-20: 100 [IU] via INTRAMUSCULAR

## 2024-06-20 MED ORDER — ONDANSETRON HCL 8 MG PO TABS
8.0000 mg | ORAL_TABLET | Freq: Three times a day (TID) | ORAL | 3 refills | Status: AC | PRN
  Filled 2024-06-20 (×2): qty 20, 7d supply, fill #0
  Filled 2024-10-14: qty 20, 7d supply, fill #1
  Filled 2024-12-12: qty 20, 7d supply, fill #2

## 2024-06-20 MED ORDER — KETOROLAC TROMETHAMINE 60 MG/2ML IM SOLN
60.0000 mg | Freq: Once | INTRAMUSCULAR | Status: AC
  Administered 2024-06-20: 60 mg via INTRAMUSCULAR

## 2024-06-20 MED ORDER — BOTOX 200 UNITS IJ SOLR
200.0000 [IU] | INTRAMUSCULAR | 3 refills | Status: AC
  Filled 2024-06-20 – 2024-10-15 (×4): qty 1, 90d supply, fill #0
  Filled 2025-01-07 (×2): qty 1, 90d supply, fill #1

## 2024-06-20 MED ORDER — BUTALBITAL-APAP-CAFFEINE 50-300-40 MG PO CAPS: 1.0000 | ORAL_CAPSULE | Freq: Three times a day (TID) | ORAL | 1 refills | Status: AC | PRN

## 2024-06-20 NOTE — Progress Notes (Signed)
 Patient presents for management of HA/Botox   Pt supplied. Pt has a migraine right now.  S: Pt in office today for Botox  injections. She has been getting more and more headaches lately.  She has one currently that has not responded to Nurtec or Fioricet  and requests to do Toradol  injection.  She has a black eye on her right that is the result of a fall.  She was walking outside and lost her footing over some debris.  She turned her ankle and landed on her right eye.    Today her BP is quite low.  She notes she is not feeling tired or faint but does not feel well in general.   O: BP (!) 87/54   Pulse 69   Wt 183 lb (83 kg)   BMI 31.41 kg/m    Botox  Procedure Note Vial of Botox  was : Supplied by Patient   Botox  Dosing by [facility] for Chronic Migraine   Injection Sites for Migraines  Botox  100 units was injected using the dosage in the table above in the pattern shown above. Toradol  60mg  IM to address today's headache.    A: Migraine  Muscle spasm  Status migraine  P: Botox  100 units injected today.  Will increase to 200units for future procedures.   Toradol  for current status migraine Meds refilled as requested. RTC 3 Months.

## 2024-06-25 ENCOUNTER — Other Ambulatory Visit (HOSPITAL_COMMUNITY): Payer: Self-pay

## 2024-06-25 MED ORDER — BUTALBITAL-APAP-CAFFEINE 50-300-40 MG PO CAPS
1.0000 | ORAL_CAPSULE | Freq: Three times a day (TID) | ORAL | 1 refills | Status: DC | PRN
  Filled 2024-06-25 – 2024-09-15 (×2): qty 30, 5d supply, fill #0
  Filled 2024-10-28: qty 30, 5d supply, fill #1

## 2024-07-04 ENCOUNTER — Other Ambulatory Visit: Payer: Self-pay | Admitting: Family Medicine

## 2024-07-05 ENCOUNTER — Other Ambulatory Visit (HOSPITAL_COMMUNITY): Payer: Self-pay

## 2024-07-07 ENCOUNTER — Other Ambulatory Visit (HOSPITAL_COMMUNITY): Payer: Self-pay

## 2024-07-07 ENCOUNTER — Other Ambulatory Visit: Payer: Self-pay

## 2024-07-07 MED ORDER — CELECOXIB 100 MG PO CAPS
100.0000 mg | ORAL_CAPSULE | Freq: Two times a day (BID) | ORAL | 0 refills | Status: DC
  Filled 2024-07-07: qty 60, 30d supply, fill #0

## 2024-07-08 ENCOUNTER — Other Ambulatory Visit (HOSPITAL_COMMUNITY): Payer: Self-pay

## 2024-07-14 ENCOUNTER — Other Ambulatory Visit (HOSPITAL_COMMUNITY): Payer: Self-pay

## 2024-07-17 ENCOUNTER — Other Ambulatory Visit (HOSPITAL_BASED_OUTPATIENT_CLINIC_OR_DEPARTMENT_OTHER): Payer: Self-pay

## 2024-08-18 ENCOUNTER — Ambulatory Visit: Admitting: Family Medicine

## 2024-08-18 ENCOUNTER — Other Ambulatory Visit (HOSPITAL_COMMUNITY): Payer: Self-pay

## 2024-08-18 VITALS — BP 120/78 | HR 60 | Temp 98.4°F | Resp 16 | Ht 64.0 in | Wt 179.0 lb

## 2024-08-18 DIAGNOSIS — I10 Essential (primary) hypertension: Secondary | ICD-10-CM

## 2024-08-18 DIAGNOSIS — Z23 Encounter for immunization: Secondary | ICD-10-CM | POA: Diagnosis not present

## 2024-08-18 DIAGNOSIS — Z0000 Encounter for general adult medical examination without abnormal findings: Secondary | ICD-10-CM

## 2024-08-18 DIAGNOSIS — F419 Anxiety disorder, unspecified: Secondary | ICD-10-CM

## 2024-08-18 DIAGNOSIS — G4700 Insomnia, unspecified: Secondary | ICD-10-CM | POA: Diagnosis not present

## 2024-08-18 DIAGNOSIS — F418 Other specified anxiety disorders: Secondary | ICD-10-CM

## 2024-08-18 DIAGNOSIS — E7800 Pure hypercholesterolemia, unspecified: Secondary | ICD-10-CM

## 2024-08-18 DIAGNOSIS — Z1211 Encounter for screening for malignant neoplasm of colon: Secondary | ICD-10-CM

## 2024-08-18 DIAGNOSIS — K219 Gastro-esophageal reflux disease without esophagitis: Secondary | ICD-10-CM

## 2024-08-18 MED ORDER — TRAZODONE HCL 50 MG PO TABS
ORAL_TABLET | Freq: Every evening | ORAL | 0 refills | Status: AC | PRN
  Filled 2024-08-18: qty 30, 30d supply, fill #0

## 2024-08-18 MED ORDER — SERTRALINE HCL 100 MG PO TABS
150.0000 mg | ORAL_TABLET | Freq: Every day | ORAL | 3 refills | Status: AC
Start: 2024-08-18 — End: ?
  Filled 2024-08-18 – 2024-10-14 (×2): qty 135, 90d supply, fill #0

## 2024-08-18 MED ORDER — PANTOPRAZOLE SODIUM 40 MG PO TBEC
40.0000 mg | DELAYED_RELEASE_TABLET | Freq: Every day | ORAL | 3 refills | Status: AC
Start: 2024-08-18 — End: ?
  Filled 2024-08-18 – 2024-11-07 (×3): qty 90, 90d supply, fill #0

## 2024-08-18 MED ORDER — CELECOXIB 100 MG PO CAPS
100.0000 mg | ORAL_CAPSULE | Freq: Two times a day (BID) | ORAL | 5 refills | Status: AC
  Filled 2024-08-18: qty 60, 30d supply, fill #0
  Filled 2024-10-14: qty 60, 30d supply, fill #1
  Filled 2024-12-12: qty 60, 30d supply, fill #2

## 2024-08-18 MED ORDER — HYDROXYZINE PAMOATE 50 MG PO CAPS
ORAL_CAPSULE | ORAL | 2 refills | Status: AC
Start: 2024-08-18 — End: ?
  Filled 2024-08-18: qty 180, 30d supply, fill #0

## 2024-08-18 NOTE — Assessment & Plan Note (Signed)
 Encourage heart healthy diet such as MIND or DASH diet, increase exercise, avoid trans fats, simple carbohydrates and processed foods, consider a krill or fish or flaxseed oil cap daily.

## 2024-08-18 NOTE — Patient Instructions (Signed)
 Preventive Care 83 Years and Older, Female Preventive care refers to lifestyle choices and visits with your health care provider that can promote health and wellness. Preventive care visits are also called wellness exams. What can I expect for my preventive care visit? Counseling Your health care provider may ask you questions about your: Medical history, including: Past medical problems. Family medical history. Pregnancy and menstrual history. History of falls. Current health, including: Memory and ability to understand (cognition). Emotional well-being. Home life and relationship well-being. Sexual activity and sexual health. Lifestyle, including: Alcohol, nicotine or tobacco, and drug use. Access to firearms. Diet, exercise, and sleep habits. Work and work Astronomer. Sunscreen use. Safety issues such as seatbelt and bike helmet use. Physical exam Your health care provider will check your: Height and weight. These may be used to calculate your BMI (body mass index). BMI is a measurement that tells if you are at a healthy weight. Waist circumference. This measures the distance around your waistline. This measurement also tells if you are at a healthy weight and may help predict your risk of certain diseases, such as type 2 diabetes and high blood pressure. Heart rate and blood pressure. Body temperature. Skin for abnormal spots. What immunizations do I need?  Vaccines are usually given at various ages, according to a schedule. Your health care provider will recommend vaccines for you based on your age, medical history, and lifestyle or other factors, such as travel or where you work. What tests do I need? Screening Your health care provider may recommend screening tests for certain conditions. This may include: Lipid and cholesterol levels. Hepatitis C test. Hepatitis B test. HIV (human immunodeficiency virus) test. STI (sexually transmitted infection) testing, if you are at  risk. Lung cancer screening. Colorectal cancer screening. Diabetes screening. This is done by checking your blood sugar (glucose) after you have not eaten for a while (fasting). Mammogram. Talk with your health care provider about how often you should have regular mammograms. BRCA-related cancer screening. This may be done if you have a family history of breast, ovarian, tubal, or peritoneal cancers. Bone density scan. This is done to screen for osteoporosis. Talk with your health care provider about your test results, treatment options, and if necessary, the need for more tests. Follow these instructions at home: Eating and drinking  Eat a diet that includes fresh fruits and vegetables, whole grains, lean protein, and low-fat dairy products. Limit your intake of foods with high amounts of sugar, saturated fats, and salt. Take vitamin and mineral supplements as recommended by your health care provider. Do not drink alcohol if your health care provider tells you not to drink. If you drink alcohol: Limit how much you have to 0-1 drink a day. Know how much alcohol is in your drink. In the [place] one drink equals one 12 oz bottle of beer (355 mL), one 5 oz glass of wine (148 mL), or one 1 oz glass of hard liquor (44 mL). Lifestyle Brush your teeth every morning and night with fluoride toothpaste. Floss one time each day. Exercise for at least 30 minutes 5 or more days each week. Do not use any products that contain nicotine or tobacco. These products include cigarettes, chewing tobacco, and vaping devices, such as e-cigarettes. If you need help quitting, ask your health care provider. Do not use drugs. If you are sexually active, practice safe sex. Use a condom or other form of protection in order to prevent STIs. Take aspirin only as told by  your health care provider. Make sure that you understand how much to take and what form to take. Work with your health care provider to find out whether it  is safe and beneficial for you to take aspirin daily. Ask your health care provider if you need to take a cholesterol-lowering medicine (statin). Find healthy ways to manage stress, such as: Meditation, yoga, or listening to music. Journaling. Talking to a trusted person. Spending time with friends and family. Minimize exposure to UV radiation to reduce your risk of skin cancer. Safety Always wear your seat belt while driving or riding in a vehicle. Do not drive: If you have been drinking alcohol. Do not ride with someone who has been drinking. When you are tired or distracted. While texting. If you have been using any mind-altering substances or drugs. Wear a helmet and other protective equipment during sports activities. If you have firearms in your house, make sure you follow all gun safety procedures. What's next? Visit your health care provider once a year for an annual wellness visit. Ask your health care provider how often you should have your eyes and teeth checked. Stay up to date on all vaccines. This information is not intended to replace advice given to you by your health care provider. Make sure you discuss any questions you have with your health care provider. Document Revised: 05/25/2021 Document Reviewed: 05/25/2021 Elsevier Patient Education  2024 [employer].

## 2024-08-18 NOTE — Assessment & Plan Note (Signed)
 Well controlled, no changes to meds. Encouraged heart healthy diet such as the DASH diet and exercise as tolerated.

## 2024-08-18 NOTE — Progress Notes (Signed)
 Subjective:    [name] is a 63 y.o. female who presents for a Welcome to Medicare exam.          Objective:    Today's Vitals   08/18/24 1536  BP: 120/78  Pulse: 60  Resp: 16  Temp: 98.4 F (36.9 C)  TempSrc: Oral  SpO2: 96%  Weight: 179 lb (81.2 kg)  Height: 5' 4 (1.626 m)  Body mass index is 30.73 kg/m.  Medications Outpatient Encounter Medications as of 08/18/2024  Medication Sig   amLODipine  (NORVASC ) 5 MG tablet Take 1 tablet (5 mg total) by mouth in the morning and at bedtime.   Ascorbic Acid (VITAMIN C PO) Take by mouth.   aspirin EC 81 MG tablet Take 81 mg by mouth daily.   azelastine  (ASTELIN ) 0.1 % nasal spray Place 2 sprays into both nostrils 2 (two) times daily. Use in each nostril as directed   baclofen  (LIORESAL ) 10 MG tablet TAKE 1 TABLET BY MOUTH EVERY 8 HOURS AS NEEDED   botulinum toxin Type A  (BOTOX ) 200 units injection Inject 200 Units into the muscle every 3 (three) months.   Butalbital -APAP-Caffeine  (FIORICET ) 50-300-40 MG CAPS Take 1-2 capsules by mouth every 8 (eight) hours as needed.   Butalbital -APAP-Caffeine  50-300-40 MG CAPS Take 1-2 capsules by mouth every 8 (eight) hours as needed.   Calcium  Carbonate (CALCIUM  600 PO) Take by mouth.   ezetimibe  (ZETIA ) 10 MG tablet Take 1 tablet (10 mg total) by mouth daily.   fluorouracil  (EFUDEX ) 5 % cream Apply once a day for 42 days as directed.   fluticasone  (FLONASE ) 50 MCG/ACT nasal spray Place 2 sprays into both nostrils daily.   hydrocortisone  2.5 % ointment Apply 1 Application topically 2 (two) times daily for 14 days.   ibuprofen  (ADVIL ) 800 MG tablet Take 1 tablet (800 mg total) by mouth every 8 (eight) hours as needed.   ketoconazole  (NIZORAL ) 2 % cream Apply 1 application Externally Twice a day for flares x 14 days   ketoconazole  (NIZORAL ) 2 % cream Apply to affected area twice daily for flares for 14 days   magnesium oxide (MAG-OX) 400 MG tablet Take 400 mg by mouth daily.    metroNIDAZOLE  (METROGEL ) 0.75 % gel Apply 1 application Externally Twice a day for flares, then once a day   metroNIDAZOLE  (METROGEL ) 0.75 % gel Apply topically 2 (two) times daily for flares.  Then decrease to once a day as directed.   Multiple Vitamins-Minerals (ZINC PO) Take by mouth.   naratriptan  (AMERGE) 2.5 MG tablet TAKE 1 TABLET BY MOUTH AT ONSET OF HEADACHE. IF HEADACHE RETURNS OR DOES NOT RESOLVE MAY REPEAT AFTER 4 HOURS. DO NOT EXCEED 2 TABLETS IN 24 HOURS   NONFORMULARY OR COMPOUNDED ITEM Gaba Calm as needed for anxiety   nystatin  (MYCOSTATIN ) 100000 UNIT/ML suspension Swish around mouth/throat and swallow 4 mLs (400,000 Units total) by mouth 3 (three) times daily.   ondansetron  (ZOFRAN ) 8 MG tablet Take 1 tablet (8 mg total) by mouth every 8 (eight) hours as needed for nausea or vomiting.   Probiotic Product (PROBIOTIC DAILY PO) Take by mouth.   Pseudoephedrine HCl (SUDAFED PO) Take 1 tablet by mouth daily as needed.   RESTASIS  0.05 % ophthalmic emulsion Place 1 drop into both eyes 2 times daily.   Rimegepant Sulfate  (NURTEC) 75 MG TBDP Dissolve 1 tablet by mouth every other day.   rosuvastatin  (CRESTOR ) 40 MG tablet Take 1 tablet (40 mg total) by mouth daily. Please  keep your upcoming appointment for refills.   tiZANidine  (ZANAFLEX ) 4 MG tablet Take 1 tablet (4 mg total) by mouth 3 (three) times daily.   topiramate  (TOPAMAX ) 200 MG tablet TAKE 1 TABLET BY MOUTH ONCE DAILY   triamcinolone  cream (KENALOG ) 0.1 % Apply to affected areas 2 times a day for 14 days   triamcinolone  cream (KENALOG ) 0.1 % Apply to affected area twice daily for 14 days   UNABLE TO FIND CBD 50 mg   vitamin B-12 (CYANOCOBALAMIN) 500 MCG tablet Take 500 mcg by mouth daily.   VITAMIN D PO Take by mouth.   [DISCONTINUED] celecoxib  (CELEBREX ) 100 MG capsule Take 1 capsule (100 mg total) by mouth 2 (two) times daily. **Schedule appointment before next refill needed.**   [DISCONTINUED] hydrOXYzine  (VISTARIL ) 50 MG  capsule TAKE 2 CAPSULES BY MOUTH 3 TIMES DAILY AS NEEDED FOR ITCHING   [DISCONTINUED] pantoprazole  (PROTONIX ) 40 MG tablet Take 1 tablet (40 mg total) by mouth daily.   [DISCONTINUED] sertraline  (ZOLOFT ) 100 MG tablet Take 1.5 tablets (150 mg total) by mouth daily.   [DISCONTINUED] traZODone  (DESYREL ) 50 MG tablet TAKE 1/2 TO 1 TABLET BY MOUTH AT BEDTIME AS NEEDED FOR SLEEP   celecoxib  (CELEBREX ) 100 MG capsule Take 1 capsule (100 mg total) by mouth 2 (two) times daily.   hydrOXYzine  (VISTARIL ) 50 MG capsule TAKE 2 CAPSULES BY MOUTH 3 TIMES DAILY AS NEEDED FOR ITCHING   pantoprazole  (PROTONIX ) 40 MG tablet Take 1 tablet (40 mg total) by mouth daily.   sertraline  (ZOLOFT ) 100 MG tablet Take 1.5 tablets (150 mg total) by mouth daily.   traZODone  (DESYREL ) 50 MG tablet TAKE 1/2 TO 1 TABLET BY MOUTH AT BEDTIME AS NEEDED FOR SLEEP   No facility-administered encounter medications on file as of 08/18/2024.     History: Past Medical History:  Diagnosis Date   Allergy    seasonal allergies   Anxiety    on meds   Arthritis    on meds   Depression    on meds   Dizziness 03/05/2017   Essential hypertension 03/05/2017   on meds   GERD (gastroesophageal reflux disease)    on meds   Hyperlipidemia 03/05/2017   on meds   Infertility, female    Migraines    Osteopenia    Past Surgical History:  Procedure Laterality Date   ANKLE SURGERY Left 1979   COLONOSCOPY  2011   MS-F/V-moviprep(exc)-normal -10 yr recall   SHOULDER SURGERY Left 1990    Family History  Problem Relation Age of Onset   Hypertension Mother    Allergic rhinitis Mother    Sleep apnea Mother    Hyperlipidemia Mother    Cancer Maternal Grandmother    Stroke Other    Eczema Daughter    Alcohol  abuse Father    Stroke Father    Heart disease Maternal Grandfather    Alcohol  abuse Paternal Grandfather    Colon polyps Neg Hx    Colon cancer Neg Hx    Esophageal cancer Neg Hx    Rectal cancer Neg Hx    Stomach cancer Neg Hx     Social History   Occupational History    Employer: [employer]    Comment: [employer]  Tobacco Use   Smoking status: Former    Current packs/day: 0.00    Types: Cigarettes    Quit date: 02/09/1992    Years since quitting: 32.5   Smokeless tobacco: Never  Vaping Use   Vaping  status: Never Used  Substance and Sexual Activity   Alcohol  use: Yes    Alcohol /week: 12.0 standard drinks of alcohol     Types: 12 Standard drinks or equivalent per week   Drug use: No   Sexual activity: Yes    Partners: Male    Tobacco Counseling Counseling given: Not Answered   Immunizations and Health Maintenance Immunization History  Administered Date(s) Administered   Influenza Nasal 10/10/2020   Influenza, Seasonal, Injecte, Preservative Fre 12/10/2023   Influenza-Unspecified 11/11/2011, 10/11/2014, 10/13/2018, 10/10/2022   PFIZER(Purple Top)SARS-COV-2 Vaccination 12/16/2019, 01/16/2020, 09/17/2020   PNEUMOCOCCAL CONJUGATE-20 08/18/2024   Tdap 02/24/2020   Health Maintenance Due  Topic Date Due   Zoster Vaccines- Shingrix (1 of 2) Never done   Colonoscopy  11/10/2020   MAMMOGRAM  09/29/2023   COVID-19 Vaccine (4 - 2025-26 season) 08/11/2024   Cervical Cancer Screening (HPV/Pap Cotest)  09/02/2024    Activities of Daily Living    08/18/2024   12:44 PM  In your present state of health, do you have any difficulty performing the following activities:  Hearing? 0  Vision? 0  Difficulty concentrating or making decisions? 1  Walking or climbing stairs? 0  Dressing or bathing? 0  Doing errands, shopping? 0  Preparing Food and eating ? N  Using the Toilet? N  In the past six months, have you accidently leaked urine? N  Do you have problems with loss of bowel control? N  Managing your Medications? N  Managing your Finances? N  Housekeeping or managing your Housekeeping? N    Physical Exam   Physical Exam Vitals and nursing note reviewed.  Constitutional:       General: She is not in acute distress.    Appearance: Normal appearance. She is well-developed.  HENT:     Head: Normocephalic and atraumatic.     Right Ear: Tympanic membrane, ear canal and external ear normal. There is no impacted cerumen.     Left Ear: Tympanic membrane, ear canal and external ear normal. There is no impacted cerumen.     Nose: Nose normal.     Mouth/Throat:     Mouth: Mucous membranes are moist.     Pharynx: Oropharynx is clear. No oropharyngeal exudate or posterior oropharyngeal erythema.  Eyes:     General: No scleral icterus.       Right eye: No discharge.        Left eye: No discharge.     Conjunctiva/sclera: Conjunctivae normal.     Pupils: Pupils are equal, round, and reactive to light.  Neck:     Thyroid: No thyromegaly or thyroid tenderness.     Vascular: No JVD.  Cardiovascular:     Rate and Rhythm: Normal rate and regular rhythm.     Heart sounds: Normal heart sounds. No murmur heard. Pulmonary:     Effort: Pulmonary effort is normal. No respiratory distress.     Breath sounds: Normal breath sounds.  Abdominal:     General: Bowel sounds are normal. There is no distension.     Palpations: Abdomen is soft. There is no mass.     Tenderness: There is no abdominal tenderness. There is no guarding or rebound.  Genitourinary:    Vagina: Normal.  Musculoskeletal:        General: Normal range of motion.     Cervical back: Normal range of motion and neck supple.     Right lower leg: No edema.     Left lower leg: No edema.  Lymphadenopathy:     Cervical: No cervical adenopathy.  Skin:    General: Skin is warm and dry.     Findings: No erythema or rash.  Neurological:     Mental Status: She is alert and oriented to person, place, and time.     Cranial Nerves: No cranial nerve deficit.     Deep Tendon Reflexes: Reflexes are normal and symmetric.  Psychiatric:        Mood and Affect: Mood normal.        Behavior: Behavior normal.        Thought  Content: Thought content normal.        Judgment: Judgment normal.    (optional), or other factors deemed appropriate based on the beneficiary's medical and social history and current clinical standards.   Advanced Directives:    EKG:  normal EKG, normal sinus rhythm, unchanged from previous tracings      Assessment:    This is a routine wellness examination for this patient .   Vision/Hearing screen Vision Screening   Right eye Left eye Both eyes  Without correction     With correction 20/20 20/20 20/20   Hearing Screening - Comments:: Normal whisper    Goals   None     Depression Screen    08/18/2024    3:41 PM 12/10/2023   10:58 AM 09/02/2021    9:59 AM 07/05/2020    2:59 PM  PHQ 2/9 Scores  PHQ - 2 Score 0 2 0 0  PHQ- 9 Score 6 5  8      Fall Risk    08/18/2024   12:44 PM  Fall Risk   Falls in the past year? 1  Number falls in past yr: 0  Injury with Fall? 1    Cognitive Function:    08/18/2024    4:04 PM  MMSE - Mini Mental State Exam  Orientation to time 5  Orientation to Place 5  Registration 3  Attention/ Calculation 5  Recall 3  Language- name 2 objects 2  Language- repeat 1  Language- follow 3 step command 3  Language- read & follow direction 1  Write a sentence 1  Copy design 1  Total score 30        Patient Care Team: Antonio Meth, Jamee SAUNDERS, DO as PCP - General (Family Medicine) Raford Riggs, MD as PCP - Cardiology (Cardiology) Raford Riggs, MD as Attending Physician (Cardiology) Aneita Gwendlyn DASEN, MD (Inactive) as Consulting Physician (Gastroenterology) Nance Gaskins, Darice BRAVO, PA-C as Physician Assistant (Obstetrics and Gynecology)     Plan:   Assessment and Plan Assessment & Plan Adult Wellness Visit   This routine adult wellness visit highlighted the need for routine screenings, including a colonoscopy, mammogram, and Pap smear. She engages in chair yoga and hand weights and plans to join the [employer] to increase physical  activity. Discussed flu and pneumonia vaccinations. Order colonoscopy and mammogram, and schedule Pap smear for a separate visit. Encourage continuation of chair yoga and hand weights and joining the [facility] for increased physical activity. Administer pneumonia vaccination.  Insomnia   Insomnia is likely related to stress and emotional distress from a missing pet, causing difficulty sleeping and increased trazodone  use. Continue trazodone  as needed for sleep.  Osteoarthritis of hand and neck   Chronic osteoarthritis affects her hands and neck, with significant pain in the hands, especially the right hand, and occasional neck pain. She uses Celebrex , paraffin wax, and CBD topical for symptom management. Trigger  finger was successfully treated with injections. Continue Celebrex , paraffin wax, and CBD topical application.  Gastroesophageal reflux disease (GERD)   Chronic GERD is managed with pantoprazole , which is essential for symptom control. Continue pantoprazole .    I have personally reviewed and noted the following in the patient's chart:   Medical and social history Use of alcohol , tobacco or illicit drugs  Current medications and supplements including opioid prescriptions. Patient is not currently taking opioid prescriptions. Functional ability and status Nutritional status Physical activity Advanced directives List of other physicians Hospitalizations, surgeries, and ER visits in previous 12 months Vitals Screenings to include cognitive, depression, and falls Referrals and appointments  In addition, I have reviewed and discussed with patient certain preventive protocols, quality metrics, and best practice recommendations. A written personalized care plan for preventive services as well as general preventive health recommendations were provided to patient.   Assessment and Plan Assessment & Plan Adult Wellness Visit   This routine adult wellness visit highlighted the need for  routine screenings, including a colonoscopy, mammogram, and Pap smear. She engages in chair yoga and hand weights and plans to join the [employer] to increase physical activity. Discussed flu and pneumonia vaccinations. Order colonoscopy and mammogram, and schedule Pap smear for a separate visit. Encourage continuation of chair yoga and hand weights and joining the [facility] for increased physical activity. Administer pneumonia vaccination.  Insomnia   Insomnia is likely related to stress and emotional distress from a missing pet, causing difficulty sleeping and increased trazodone  use. Continue trazodone  as needed for sleep.  Osteoarthritis of hand and neck   Chronic osteoarthritis affects her hands and neck, with significant pain in the hands, especially the right hand, and occasional neck pain. She uses Celebrex , paraffin wax, and CBD topical for symptom management. Trigger finger was successfully treated with injections. Continue Celebrex , paraffin wax, and CBD topical application.  Gastroesophageal reflux disease (GERD)   Chronic GERD is managed with pantoprazole , which is essential for symptom control. Continue pantoprazole .    Dieter Hane R Lowne Chase, DO 08/18/2024

## 2024-08-18 NOTE — Assessment & Plan Note (Signed)
 Ghm utd Check labs  See AVS  Health Maintenance  Topic Date Due   Zoster Vaccines- Shingrix (1 of 2) Never done   Pneumococcal Vaccine: 50+ Years (1 of 1 - PCV) Never done   Colonoscopy  11/10/2020   MAMMOGRAM  09/29/2023   COVID-19 Vaccine (4 - 2025-26 season) 08/11/2024   Cervical Cancer Screening (HPV/Pap Cotest)  09/02/2024   Influenza Vaccine  03/10/2025 (Originally 07/11/2024)   Medicare Annual Wellness (AWV)  08/18/2025   DTaP/Tdap/Td (2 - Td or Tdap) 02/23/2030   DEXA SCAN  Completed   Hepatitis C Screening  Completed   HIV Screening  Completed   Hepatitis B Vaccines 19-59 Average Risk  Aged Out   HPV VACCINES  Aged Out   Meningococcal B Vaccine  Aged Out

## 2024-08-19 ENCOUNTER — Other Ambulatory Visit (HOSPITAL_COMMUNITY): Payer: Self-pay

## 2024-08-19 ENCOUNTER — Other Ambulatory Visit: Payer: Self-pay

## 2024-08-19 ENCOUNTER — Ambulatory Visit: Payer: Self-pay | Admitting: Family Medicine

## 2024-08-19 DIAGNOSIS — R7989 Other specified abnormal findings of blood chemistry: Secondary | ICD-10-CM

## 2024-08-19 LAB — LIPID PANEL
Cholesterol: 135 mg/dL (ref 0–200)
HDL: 83 mg/dL (ref >39.00)
LDL Cholesterol: 23 mg/dL (ref 0–99)
NonHDL: 52.01
Total CHOL/HDL Ratio: 2
Triglycerides: 147 mg/dL (ref 0.0–149.0)
VLDL: 29.4 mg/dL (ref 0.0–40.0)

## 2024-08-19 LAB — CBC WITH DIFFERENTIAL/PLATELET
Basophils Absolute: 0 K/uL (ref 0.0–0.1)
Basophils Relative: 0.6 % (ref 0.0–3.0)
Eosinophils Absolute: 0.2 K/uL (ref 0.0–0.7)
Eosinophils Relative: 3.5 % (ref 0.0–5.0)
HCT: 39 % (ref 36.0–46.0)
Hemoglobin: 13.1 g/dL (ref 12.0–15.0)
Lymphocytes Relative: 26.9 % (ref 12.0–46.0)
Lymphs Abs: 1.8 K/uL (ref 0.7–4.0)
MCHC: 33.5 g/dL (ref 30.0–36.0)
MCV: 92.5 fl (ref 78.0–100.0)
Monocytes Absolute: 0.5 K/uL (ref 0.1–1.0)
Monocytes Relative: 8.3 % (ref 3.0–12.0)
Neutro Abs: 4 K/uL (ref 1.4–7.7)
Neutrophils Relative %: 60.7 % (ref 43.0–77.0)
Platelets: 328 K/uL (ref 150.0–400.0)
RBC: 4.22 Mil/uL (ref 3.87–5.11)
RDW: 12.5 % (ref 11.5–15.5)
WBC: 6.6 K/uL (ref 4.0–10.5)

## 2024-08-19 LAB — COMPREHENSIVE METABOLIC PANEL WITH GFR
ALT: 120 U/L — ABNORMAL HIGH (ref 0–35)
AST: 94 U/L — ABNORMAL HIGH (ref 0–37)
Albumin: 4.6 g/dL (ref 3.5–5.2)
Alkaline Phosphatase: 71 U/L (ref 39–117)
BUN: 15 mg/dL (ref 6–23)
CO2: 29 meq/L (ref 19–32)
Calcium: 9.4 mg/dL (ref 8.4–10.5)
Chloride: 101 meq/L (ref 96–112)
Creatinine, Ser: 0.75 mg/dL (ref 0.40–1.20)
GFR: 83.66 mL/min (ref >60.00)
Glucose, Bld: 93 mg/dL (ref 70–99)
Potassium: 4.3 meq/L (ref 3.5–5.1)
Sodium: 136 meq/L (ref 135–145)
Total Bilirubin: 0.5 mg/dL (ref 0.2–1.2)
Total Protein: 7 g/dL (ref 6.0–8.3)

## 2024-08-19 LAB — TSH: TSH: 1.26 u[IU]/mL (ref 0.35–5.50)

## 2024-08-22 ENCOUNTER — Other Ambulatory Visit (HOSPITAL_COMMUNITY): Payer: Self-pay

## 2024-08-26 ENCOUNTER — Other Ambulatory Visit: Payer: Self-pay

## 2024-08-26 ENCOUNTER — Other Ambulatory Visit (HOSPITAL_COMMUNITY): Payer: Self-pay

## 2024-08-26 ENCOUNTER — Telehealth: Payer: Self-pay

## 2024-08-26 NOTE — Progress Notes (Signed)
 PA submitted.

## 2024-08-26 NOTE — Telephone Encounter (Signed)
 Pharmacy Patient Advocate Encounter  Received notification from [employer] that Prior Authorization for Botox  has been APPROVED from 08/26/24 to 12/05/24   PA #/Case ID/Reference #: [id number]

## 2024-08-26 NOTE — Telephone Encounter (Signed)
 Pharmacy Patient Advocate Encounter   Received notification from Patient Pharmacy that prior authorization for Botox  is required/requested.   Insurance verification completed.   The patient is insured through [facility] .   Per test claim: PA required; PA submitted to above mentioned insurance via Latent Key/confirmation #/EOC [facility] Status is pending

## 2024-08-26 NOTE — Progress Notes (Signed)
 Benefits Investigation Started  Rejection: Prior Authorization Required  Routed to: [employer]

## 2024-08-27 ENCOUNTER — Other Ambulatory Visit (HOSPITAL_COMMUNITY): Payer: Self-pay

## 2024-08-28 ENCOUNTER — Other Ambulatory Visit: Payer: Self-pay

## 2024-08-28 ENCOUNTER — Other Ambulatory Visit (HOSPITAL_BASED_OUTPATIENT_CLINIC_OR_DEPARTMENT_OTHER): Payer: Self-pay

## 2024-08-29 ENCOUNTER — Other Ambulatory Visit (HOSPITAL_BASED_OUTPATIENT_CLINIC_OR_DEPARTMENT_OTHER): Payer: Self-pay

## 2024-09-01 ENCOUNTER — Other Ambulatory Visit: Payer: Self-pay

## 2024-09-02 ENCOUNTER — Telehealth: Payer: Self-pay | Admitting: Physician Assistant

## 2024-09-02 NOTE — Telephone Encounter (Signed)
Called to schedule appt lvm

## 2024-09-03 ENCOUNTER — Other Ambulatory Visit (HOSPITAL_COMMUNITY): Payer: Self-pay

## 2024-09-04 ENCOUNTER — Other Ambulatory Visit

## 2024-09-04 ENCOUNTER — Other Ambulatory Visit (HOSPITAL_COMMUNITY): Payer: Self-pay

## 2024-09-05 ENCOUNTER — Other Ambulatory Visit (HOSPITAL_BASED_OUTPATIENT_CLINIC_OR_DEPARTMENT_OTHER): Payer: Self-pay

## 2024-09-08 ENCOUNTER — Other Ambulatory Visit: Payer: Self-pay

## 2024-09-15 ENCOUNTER — Other Ambulatory Visit (HOSPITAL_COMMUNITY): Payer: Self-pay

## 2024-09-18 ENCOUNTER — Other Ambulatory Visit (HOSPITAL_COMMUNITY): Payer: Self-pay

## 2024-10-14 ENCOUNTER — Other Ambulatory Visit (HOSPITAL_BASED_OUTPATIENT_CLINIC_OR_DEPARTMENT_OTHER): Payer: Self-pay | Admitting: Family

## 2024-10-14 ENCOUNTER — Other Ambulatory Visit: Payer: Self-pay

## 2024-10-14 ENCOUNTER — Other Ambulatory Visit (HOSPITAL_COMMUNITY): Payer: Self-pay

## 2024-10-14 DIAGNOSIS — E785 Hyperlipidemia, unspecified: Secondary | ICD-10-CM

## 2024-10-14 DIAGNOSIS — I10 Essential (primary) hypertension: Secondary | ICD-10-CM

## 2024-10-14 MED ORDER — AMLODIPINE BESYLATE 5 MG PO TABS
5.0000 mg | ORAL_TABLET | Freq: Two times a day (BID) | ORAL | 0 refills | Status: DC
  Filled 2024-10-14 – 2024-11-07 (×3): qty 60, 30d supply, fill #0

## 2024-10-14 MED ORDER — METRONIDAZOLE 0.75 % EX GEL
CUTANEOUS | 2 refills | Status: AC
  Filled 2024-10-14: qty 45, 25d supply, fill #0
  Filled 2024-11-07: qty 45, 25d supply, fill #1

## 2024-10-15 ENCOUNTER — Other Ambulatory Visit: Payer: Self-pay

## 2024-10-15 NOTE — Progress Notes (Signed)
 Specialty Pharmacy Refill Coordination Note  [name] is a 63 y.o. female contacted today regarding refills of specialty medication(s) OnabotulinumtoxinA  (Botox )   Patient requested Courier to Provider Office   Delivery date: 10/16/24   Verified address: [facility]   [address]   [place] [address]   Medication will be filled on: 10/16/24  Copay: $503.58, patient aware

## 2024-10-16 ENCOUNTER — Other Ambulatory Visit: Payer: Self-pay

## 2024-10-17 ENCOUNTER — Encounter: Payer: Self-pay | Admitting: Physician Assistant

## 2024-10-17 ENCOUNTER — Ambulatory Visit: Admitting: Physician Assistant

## 2024-10-17 VITALS — BP 87/59 | HR 69 | Wt 177.0 lb

## 2024-10-17 DIAGNOSIS — G43709 Chronic migraine without aura, not intractable, without status migrainosus: Secondary | ICD-10-CM | POA: Diagnosis not present

## 2024-10-17 MED ORDER — ONABOTULINUMTOXINA 200 UNITS IJ SOLR
200.0000 [IU] | Freq: Once | INTRAMUSCULAR | Status: AC
  Administered 2024-10-17: 200 [IU] via INTRAMUSCULAR

## 2024-10-17 NOTE — Progress Notes (Signed)
 Pt here for Botox  Injection   Was doing well up until this week. Notes having headache all week.  S: Pt in office today for Botox  injections. Her Botox  has been working very well for migraine prevention up until a week or two ago.  Of note, she is overdue for botox  by several weeks.  She has been getting regular massage.  She has many upcoming fun trips planned.    O: BP (!) 87/59   Pulse 69   Wt 177 lb (80.3 kg)   BMI 30.38 kg/m    Botox  Procedure Note Vial of Botox  was : Supplied by Patient   Botox  Dosing by [employer] for Chronic Migraine   Injection Sites for Migraines  Botox  155 units was injected using the dosage in the table above in the pattern shown above.  45 units wasted.  A: Migraine  Muscle spasm   P: Botox  155 (of 200) units injected today.  RTC 3 Months.

## 2024-10-24 ENCOUNTER — Other Ambulatory Visit (HOSPITAL_COMMUNITY): Payer: Self-pay

## 2024-10-28 ENCOUNTER — Other Ambulatory Visit (HOSPITAL_COMMUNITY): Payer: Self-pay

## 2024-10-28 ENCOUNTER — Other Ambulatory Visit: Payer: Self-pay

## 2024-10-31 ENCOUNTER — Other Ambulatory Visit: Payer: Self-pay

## 2024-11-01 ENCOUNTER — Other Ambulatory Visit (HOSPITAL_COMMUNITY): Payer: Self-pay

## 2024-11-03 ENCOUNTER — Other Ambulatory Visit: Payer: Self-pay

## 2024-11-07 ENCOUNTER — Other Ambulatory Visit (HOSPITAL_COMMUNITY): Payer: Self-pay

## 2024-11-11 ENCOUNTER — Other Ambulatory Visit: Payer: Self-pay

## 2024-11-12 ENCOUNTER — Other Ambulatory Visit: Payer: Self-pay

## 2024-11-19 ENCOUNTER — Other Ambulatory Visit: Payer: Self-pay

## 2024-11-19 ENCOUNTER — Telehealth: Payer: Self-pay

## 2024-11-19 NOTE — Telephone Encounter (Signed)
 Pharmacy Patient Advocate Encounter   Received notification from [employer] that prior authorization for Botox  is required/requested.   Insurance verification completed.   The patient is insured through [facility].   Per test claim: PA required; PA submitted to above mentioned insurance via Latent Key/confirmation #/EOC [id number] Status is pending

## 2024-11-20 NOTE — Telephone Encounter (Signed)
 Pharmacy Patient Advocate Encounter  Received notification from [employer] that Prior Authorization for Botox  has been APPROVED from 11/20/24 to 12/10/25   PA #/Case ID/Reference #: [id number]

## 2024-12-12 ENCOUNTER — Other Ambulatory Visit: Payer: Self-pay | Admitting: Physician Assistant

## 2024-12-12 ENCOUNTER — Other Ambulatory Visit: Payer: Self-pay | Admitting: Family Medicine

## 2024-12-12 ENCOUNTER — Other Ambulatory Visit: Payer: Self-pay | Admitting: Cardiovascular Disease

## 2024-12-12 ENCOUNTER — Other Ambulatory Visit (HOSPITAL_COMMUNITY): Payer: Self-pay

## 2024-12-12 ENCOUNTER — Other Ambulatory Visit (HOSPITAL_BASED_OUTPATIENT_CLINIC_OR_DEPARTMENT_OTHER): Payer: Self-pay | Admitting: Cardiovascular Disease

## 2024-12-12 DIAGNOSIS — I10 Essential (primary) hypertension: Secondary | ICD-10-CM

## 2024-12-12 DIAGNOSIS — E785 Hyperlipidemia, unspecified: Secondary | ICD-10-CM

## 2024-12-12 DIAGNOSIS — M542 Cervicalgia: Secondary | ICD-10-CM

## 2024-12-15 ENCOUNTER — Other Ambulatory Visit: Payer: Self-pay

## 2024-12-15 ENCOUNTER — Other Ambulatory Visit (HOSPITAL_COMMUNITY): Payer: Self-pay

## 2024-12-15 MED ORDER — AMLODIPINE BESYLATE 5 MG PO TABS
5.0000 mg | ORAL_TABLET | Freq: Two times a day (BID) | ORAL | 0 refills | Status: AC
  Filled 2024-12-15: qty 30, 15d supply, fill #0

## 2024-12-15 MED ORDER — ROSUVASTATIN CALCIUM 40 MG PO TABS
40.0000 mg | ORAL_TABLET | Freq: Every day | ORAL | 0 refills | Status: AC
  Filled 2024-12-15: qty 15, 15d supply, fill #0

## 2024-12-15 MED ORDER — EZETIMIBE 10 MG PO TABS
10.0000 mg | ORAL_TABLET | Freq: Every day | ORAL | 0 refills | Status: AC
  Filled 2024-12-15: qty 15, 15d supply, fill #0

## 2024-12-15 MED ORDER — METRONIDAZOLE 0.75 % EX GEL
1.0000 | Freq: Two times a day (BID) | CUTANEOUS | 2 refills | Status: AC
  Filled 2024-12-15: qty 45, 30d supply, fill #0

## 2025-01-07 ENCOUNTER — Other Ambulatory Visit: Payer: Self-pay | Admitting: Pharmacy Technician

## 2025-01-07 ENCOUNTER — Telehealth: Payer: Self-pay

## 2025-01-07 ENCOUNTER — Other Ambulatory Visit: Payer: Self-pay

## 2025-01-07 NOTE — Telephone Encounter (Signed)
 error

## 2025-01-07 NOTE — Progress Notes (Deleted)
 Patient is agreeable to copay and will pay full amount of $792.40. Payment information collected

## 2025-01-07 NOTE — Progress Notes (Signed)
 Specialty Pharmacy Initial Fill Coordination Note  [name] is a 64 y.o. female contacted today regarding initial fill of specialty medication(s) OnabotulinumtoxinA  (Botox )   Patient requested Courier to Provider Office   Delivery date: 01/13/25   Verified address: [facility] [address]   Medication will be filled on: 01/12/25   Patient is aware of $792.40 copayment.

## 2025-01-09 ENCOUNTER — Other Ambulatory Visit: Payer: Self-pay

## 2025-01-16 ENCOUNTER — Other Ambulatory Visit (HOSPITAL_COMMUNITY): Payer: Self-pay

## 2025-01-16 ENCOUNTER — Other Ambulatory Visit: Payer: Self-pay

## 2025-01-16 MED ORDER — TIZANIDINE HCL 4 MG PO TABS
4.0000 mg | ORAL_TABLET | Freq: Three times a day (TID) | ORAL | 2 refills | Status: AC
  Filled 2025-01-16: qty 90, 30d supply, fill #0

## 2025-01-16 MED ORDER — BUTALBITAL-APAP-CAFFEINE 50-300-40 MG PO CAPS: 1.0000 | ORAL_CAPSULE | Freq: Three times a day (TID) | ORAL | 1 refills | Status: AC | PRN

## 2025-01-23 ENCOUNTER — Encounter: Admitting: Physician Assistant
